# Patient Record
Sex: Female | Born: 1945
Health system: Southern US, Community
[De-identification: ages and names within clinical notes are randomized; demographics above are authoritative.]

## PROBLEM LIST (undated history)

## (undated) DIAGNOSIS — I10 Essential (primary) hypertension: Secondary | ICD-10-CM

## (undated) DIAGNOSIS — Z8709 Personal history of other diseases of the respiratory system: Secondary | ICD-10-CM

## (undated) DIAGNOSIS — R51 Headache: Secondary | ICD-10-CM

## (undated) DIAGNOSIS — N183 Chronic kidney disease, stage 3 unspecified: Secondary | ICD-10-CM

## (undated) DIAGNOSIS — R609 Edema, unspecified: Secondary | ICD-10-CM

## (undated) DIAGNOSIS — K219 Gastro-esophageal reflux disease without esophagitis: Secondary | ICD-10-CM

## (undated) DIAGNOSIS — H269 Unspecified cataract: Secondary | ICD-10-CM

## (undated) DIAGNOSIS — J189 Pneumonia, unspecified organism: Secondary | ICD-10-CM

## (undated) DIAGNOSIS — F32A Depression, unspecified: Secondary | ICD-10-CM

## (undated) DIAGNOSIS — M797 Fibromyalgia: Secondary | ICD-10-CM

## (undated) DIAGNOSIS — T4145XA Adverse effect of unspecified anesthetic, initial encounter: Secondary | ICD-10-CM

## (undated) DIAGNOSIS — F419 Anxiety disorder, unspecified: Secondary | ICD-10-CM

## (undated) DIAGNOSIS — D649 Anemia, unspecified: Secondary | ICD-10-CM

## (undated) DIAGNOSIS — G5600 Carpal tunnel syndrome, unspecified upper limb: Secondary | ICD-10-CM

## (undated) DIAGNOSIS — K589 Irritable bowel syndrome without diarrhea: Secondary | ICD-10-CM

## (undated) DIAGNOSIS — F329 Major depressive disorder, single episode, unspecified: Secondary | ICD-10-CM

## (undated) DIAGNOSIS — R0602 Shortness of breath: Secondary | ICD-10-CM

## (undated) DIAGNOSIS — I639 Cerebral infarction, unspecified: Secondary | ICD-10-CM

## (undated) DIAGNOSIS — F410 Panic disorder [episodic paroxysmal anxiety] without agoraphobia: Secondary | ICD-10-CM

## (undated) DIAGNOSIS — N289 Disorder of kidney and ureter, unspecified: Secondary | ICD-10-CM

## (undated) DIAGNOSIS — E119 Type 2 diabetes mellitus without complications: Secondary | ICD-10-CM

## (undated) DIAGNOSIS — M1990 Unspecified osteoarthritis, unspecified site: Secondary | ICD-10-CM

## (undated) DIAGNOSIS — I209 Angina pectoris, unspecified: Secondary | ICD-10-CM

## (undated) DIAGNOSIS — E7800 Pure hypercholesterolemia, unspecified: Secondary | ICD-10-CM

## (undated) DIAGNOSIS — G43909 Migraine, unspecified, not intractable, without status migrainosus: Secondary | ICD-10-CM

## (undated) HISTORY — PX: OTHER SURGICAL HISTORY: SHX169

## (undated) HISTORY — DX: Unspecified cataract: H26.9

## (undated) HISTORY — DX: Chronic kidney disease, stage 3 (moderate): N18.3

## (undated) HISTORY — DX: Chronic kidney disease, stage 3 unspecified: N18.30

## (undated) HISTORY — PX: CARPAL TUNNEL RELEASE: SHX101

## (undated) HISTORY — DX: Edema, unspecified: R60.9

## (undated) HISTORY — DX: Cerebral infarction, unspecified: I63.9

## (undated) HISTORY — DX: Depression, unspecified: F32.A

## (undated) HISTORY — DX: Major depressive disorder, single episode, unspecified: F32.9

## (undated) HISTORY — DX: Fibromyalgia: M79.7

---

## 1975-01-21 HISTORY — PX: VAGINAL HYSTERECTOMY: SUR661

## 1985-01-20 DIAGNOSIS — T8859XA Other complications of anesthesia, initial encounter: Secondary | ICD-10-CM

## 1985-01-20 HISTORY — DX: Other complications of anesthesia, initial encounter: T88.59XA

## 1985-01-20 HISTORY — PX: CHOLECYSTECTOMY: SHX55

## 1997-12-17 ENCOUNTER — Emergency Department (HOSPITAL_COMMUNITY): Admission: EM | Admit: 1997-12-17 | Discharge: 1997-12-17 | Payer: Self-pay | Admitting: Emergency Medicine

## 1998-08-06 ENCOUNTER — Other Ambulatory Visit: Admission: RE | Admit: 1998-08-06 | Discharge: 1998-08-06 | Payer: Self-pay | Admitting: Orthopedic Surgery

## 2000-01-21 DIAGNOSIS — I639 Cerebral infarction, unspecified: Secondary | ICD-10-CM

## 2000-01-21 HISTORY — PX: CARDIAC CATHETERIZATION: SHX172

## 2000-01-21 HISTORY — DX: Cerebral infarction, unspecified: I63.9

## 2000-03-03 ENCOUNTER — Inpatient Hospital Stay (HOSPITAL_COMMUNITY): Admission: EM | Admit: 2000-03-03 | Discharge: 2000-03-04 | Payer: Self-pay | Admitting: Emergency Medicine

## 2000-03-03 ENCOUNTER — Encounter: Payer: Self-pay | Admitting: Emergency Medicine

## 2000-04-21 ENCOUNTER — Other Ambulatory Visit: Admission: RE | Admit: 2000-04-21 | Discharge: 2000-04-21 | Payer: Self-pay | Admitting: Obstetrics and Gynecology

## 2000-10-12 ENCOUNTER — Encounter: Admission: RE | Admit: 2000-10-12 | Discharge: 2001-01-10 | Payer: Self-pay | Admitting: Family Medicine

## 2001-05-23 ENCOUNTER — Emergency Department (HOSPITAL_COMMUNITY): Admission: EM | Admit: 2001-05-23 | Discharge: 2001-05-23 | Payer: Self-pay | Admitting: Emergency Medicine

## 2001-06-15 ENCOUNTER — Other Ambulatory Visit: Admission: RE | Admit: 2001-06-15 | Discharge: 2001-06-15 | Payer: Self-pay | Admitting: Obstetrics and Gynecology

## 2001-08-21 ENCOUNTER — Encounter: Payer: Self-pay | Admitting: Emergency Medicine

## 2001-08-21 ENCOUNTER — Emergency Department (HOSPITAL_COMMUNITY): Admission: EM | Admit: 2001-08-21 | Discharge: 2001-08-21 | Payer: Self-pay | Admitting: Emergency Medicine

## 2001-09-14 ENCOUNTER — Ambulatory Visit (HOSPITAL_BASED_OUTPATIENT_CLINIC_OR_DEPARTMENT_OTHER): Admission: RE | Admit: 2001-09-14 | Discharge: 2001-09-14 | Payer: Self-pay | Admitting: Orthopaedic Surgery

## 2002-12-06 ENCOUNTER — Encounter: Payer: Self-pay | Admitting: Internal Medicine

## 2003-01-06 ENCOUNTER — Other Ambulatory Visit: Admission: RE | Admit: 2003-01-06 | Discharge: 2003-01-06 | Payer: Self-pay | Admitting: Obstetrics and Gynecology

## 2006-01-26 ENCOUNTER — Encounter: Admission: RE | Admit: 2006-01-26 | Discharge: 2006-01-26 | Payer: Self-pay | Admitting: Family Medicine

## 2008-01-21 HISTORY — PX: DEBRIDEMENT TENNIS ELBOW: SHX1442

## 2008-09-13 ENCOUNTER — Ambulatory Visit: Payer: Self-pay | Admitting: Internal Medicine

## 2008-09-13 DIAGNOSIS — R519 Headache, unspecified: Secondary | ICD-10-CM | POA: Insufficient documentation

## 2008-09-13 DIAGNOSIS — K589 Irritable bowel syndrome without diarrhea: Secondary | ICD-10-CM | POA: Insufficient documentation

## 2008-09-13 DIAGNOSIS — K8020 Calculus of gallbladder without cholecystitis without obstruction: Secondary | ICD-10-CM | POA: Insufficient documentation

## 2008-09-13 DIAGNOSIS — M129 Arthropathy, unspecified: Secondary | ICD-10-CM | POA: Insufficient documentation

## 2008-09-13 DIAGNOSIS — I6350 Cerebral infarction due to unspecified occlusion or stenosis of unspecified cerebral artery: Secondary | ICD-10-CM | POA: Insufficient documentation

## 2008-09-13 DIAGNOSIS — E669 Obesity, unspecified: Secondary | ICD-10-CM | POA: Insufficient documentation

## 2008-09-13 DIAGNOSIS — N390 Urinary tract infection, site not specified: Secondary | ICD-10-CM | POA: Insufficient documentation

## 2008-09-13 DIAGNOSIS — J45909 Unspecified asthma, uncomplicated: Secondary | ICD-10-CM | POA: Insufficient documentation

## 2008-09-13 DIAGNOSIS — I10 Essential (primary) hypertension: Secondary | ICD-10-CM | POA: Insufficient documentation

## 2008-09-13 DIAGNOSIS — IMO0001 Reserved for inherently not codable concepts without codable children: Secondary | ICD-10-CM | POA: Insufficient documentation

## 2008-09-13 DIAGNOSIS — E119 Type 2 diabetes mellitus without complications: Secondary | ICD-10-CM | POA: Insufficient documentation

## 2008-09-13 DIAGNOSIS — E785 Hyperlipidemia, unspecified: Secondary | ICD-10-CM | POA: Insufficient documentation

## 2008-09-13 DIAGNOSIS — R51 Headache: Secondary | ICD-10-CM | POA: Insufficient documentation

## 2009-11-14 ENCOUNTER — Ambulatory Visit (HOSPITAL_BASED_OUTPATIENT_CLINIC_OR_DEPARTMENT_OTHER): Admission: RE | Admit: 2009-11-14 | Discharge: 2009-11-14 | Payer: Self-pay | Admitting: Orthopedic Surgery

## 2010-04-03 LAB — GLUCOSE, CAPILLARY
Glucose-Capillary: 148 mg/dL — ABNORMAL HIGH (ref 70–99)
Glucose-Capillary: 172 mg/dL — ABNORMAL HIGH (ref 70–99)

## 2010-04-03 LAB — BASIC METABOLIC PANEL
BUN: 16 mg/dL (ref 6–23)
CO2: 27 mEq/L (ref 19–32)
Calcium: 9.7 mg/dL (ref 8.4–10.5)
Chloride: 106 mEq/L (ref 96–112)
Creatinine, Ser: 1.2 mg/dL (ref 0.4–1.2)
GFR calc Af Amer: 54 mL/min — ABNORMAL LOW (ref >60)
GFR calc non Af Amer: 45 mL/min — ABNORMAL LOW (ref >60)
Glucose, Bld: 312 mg/dL — ABNORMAL HIGH (ref 70–99)
Potassium: 4.6 mEq/L (ref 3.5–5.1)
Sodium: 138 mEq/L (ref 135–145)

## 2010-04-03 LAB — POCT HEMOGLOBIN-HEMACUE: Hemoglobin: 12.5 g/dL (ref 12.0–15.0)

## 2010-06-07 NOTE — Op Note (Signed)
NAMESHAWNAY, BRAMEL                       ACCOUNT NO.:  [id number]   MEDICAL RECORD NO.:  [id number]                   PATIENT TYPE:  AMB   LOCATION:  DSC                                  FACILITY:  [facility]   PHYSICIAN:  Lubertha Basque. Jerl Santos, M.D.             DATE OF BIRTH:  [birth year]   DATE OF PROCEDURE:  09/14/2001  DATE OF DISCHARGE:  09/14/2001                                 OPERATIVE REPORT   PREOPERATIVE DIAGNOSIS:  Left ankle impingement.   POSTOPERATIVE DIAGNOSIS:  Left ankle impingement.   PROCEDURE:  Left ankle arthroscopic synovectomy.   ANESTHESIA:  General.   SURGEON:  Lubertha Basque. Jerl Santos, M.D.   ASSISTANT:  Prince Rome, P.A.   INDICATIONS:  The patient is a 65 year old woman with more than a year of  left ankle pain.  This has persisted despite physical therapy and an  injection, which did afford her transient relief.  She is offered an  arthroscopy.  The procedure was discussed with the patient, and informed  operative consent was obtained after discussion of the possible  complications of, reaction to anesthesia, and infection.   DESCRIPTION OF PROCEDURE:  The patient was taken to the operating suite,  where general anesthesia was applied without difficulty.  She was positioned  supine and prepped and draped in the normal sterile fashion.  After the  administration of preop IV antibiotics, the left ankle was injected with 10  cc of Marcaine with epinephrine.  A small inferomedial incision was made  through skin alone with blunt dissection down into the capsule.  The scope  was then placed.  A second portal was made on the lateral aspect in a  similar fashion with blunt dissection down through the capsule after skin  incision was made superficially.  She did have a great deal of synovial  impingement in the lateral gutter, addressed with a synovectomy.  The dome  of the talus appeared benign, as did the undersurface of the tibia.  The  medial gutter  was completely benign.  The ankle was thoroughly irrigated,  followed by the placement of Marcaine with epinephrine and morphine.  Simple  sutures of nylon were used to loosely reapproximate the portals, followed by  Adaptic, dry gauze, and a loose Ace wrap.  Estimated blood loss and  intraoperative fluids can be obtained from the anesthesia records.   DISPOSITION:  The patient was extubated in the operating room and taken to  the recovery room in stable condition.  Plans were for her to go home the  same day and to follow up in the office in less than a week.  I will contact  her by phone tonight.  Lubertha Basque Jerl Santos, M.D.    PGD/MEDQ  D:  09/14/2001  T:  09/17/2001  Job:  [id number]

## 2010-06-07 NOTE — Cardiovascular Report (Signed)
[place]. [facility]  Patient:    Yolanda Saunders, Yolanda Saunders                    MRN: [id number] Proc. Date: 03/04/00 Adm. Date:  [id number] Attending:  Armanda Magic CC:         Arvella Merles, M.D.   Cardiac Catheterization  PROCEDURES PERFORMED:  Left heart catheterization.  INDICATIONS:  Chest pain.  The patient is a 65 year old white female with a past medical history of hyperlipidemia, diabetes, asthma, and a strong family history of coronary disease who presents with chest pain consistent with angina.  COMPLICATIONS:  None.  DESCRIPTION OF PROCEDURE:  The patient is brought to the cardiac catheterization laboratory in a fasting, nonsedated state.  Informed consent was obtained.  The patient was connected to continuous heart rate and pulse oximetry monitoring, and intermittent blood pressure monitoring.  The right groin was prepped and draped in a sterile fashion.  Lidocaine 1% was used for local anesthesia.  Using the modified Seldinger technique, a 6 French sheath was placed into the right femoral artery.  Under fluoroscopic guidance, a 6 French Judkins JL4 catheter was placed in the left coronary artery.  Multiple cine films were taken in the RAO and LAO position.  This catheter was then exchanged out for a 6 [place] JR4 catheter which was attempted to be placed in the right coronary artery but could not cannulate the artery.  This was then exchanged out over a wire for a 6 [place]  Noto right coronary artery catheter. Again, I was unable to cannulate the right coronary artery.  This was again exchanged out over a guidewire and the 6 [place] JR4 catheter was then placed back in the right coronary artery and was cannulated.  There was some damping of pressure because of the catheter cannulated far down in the artery. Cineangiography though was taken in the LAO position.  This catheter was then removed over a guidewire.  A 6 French angled pigtail catheter  was then placed into the left ventricular cavity over a guidewire under fluoroscopic guidance. Left ventriculography was performed in the RAO position.  This catheter was then exchanged out over a guidewire.  At the end of the procedure, all catheters and sheaths were removed.  Manual compression was performed until adequate hemostasis was obtained.  The patient was transferred back to the room in stable condition.  RESULTS: 1. Left main coronary artery.  The left main coronary artery was widely patent    throughout its course and bifurcated in the left anterior descending artery    and left circumflex artery. 2. The left anterior descending artery gave rise to two diagonal branches,    both of which are widely patent.  The left anterior descending artery is    widely patent throughout the rest of its course. 3. Left circumflex artery:  Left circumflex artery gave rise to some very    small obtuse marginal #1 and obtuse marginal branches and is widely patent    throughout its course before trifurcating the three distal vessels which    are widely patent. 4. Right coronary artery:  The right coronary artery is widely patent    throughout its course and bifurcates into a posterior descending and    posterolateral artery which are widely patent.  LEFT VENTRICULOGRAM:  The left ventriculography performed in the RAO position shows a very moderate area of apical hypokinesis with a very small focal area of apical akinesis.  IMPRESSION: 1. Normal coronary arteries. 2. Very small area of inferior apical akinesis.  Normal ejection fraction.  PLAN:  Discharge to home later today.  No Glucophage for 72 hours.  Followup with Dr. Mayford Knife in two weeks. DD:  03/04/00 TD:  03/04/00 Job: [id number]

## 2010-06-07 NOTE — H&P (Signed)
[place]. [facility]  Patient:    Yolanda Saunders, Yolanda Saunders                    MRN: [id number] Adm. Date:  [id number] Attending:  Lorre Nick CC:         [facility]   History and Physical  CHIEF COMPLAINT:  Chest pain.  HISTORY OF PRESENT ILLNESS:  This is a 65 year old white female with no previous cardiac history, who presents with substernal chest pain of three weeks duration.  The pain has increased in severity over the past two to three weeks, occurring on a daily basis, lasting for approximately 30 seconds to one minute at a time.  It is substernal in location, radiating to the right shoulder and neck, with what she described as "electrical shocks."  She rates the pain as a 7/10.  Her pain is sharp, as well as a pressure component.  She does have shortness of breath, but this is not any different from her baseline with her asthma.  She does get nausea, vomiting, diaphoresis, and dizziness with the chest pain episodes.  She occasionally has palpitations, but no syncope.  Currently she is pain-free.  Her sister just had bypass surgery and a carotid endarterectomy today.  PAST MEDICAL HISTORY: 1. Significant for diabetes mellitus. 2. Asthma. 3. Hyperlipidemia. 4. Arthritis.  ALLERGIES:  PENICILLIN, SULFA, CODEINE, ERYTHROMYCIN, AND DARVOCET.  PAST SURGICAL HISTORY: 1. Significant for a total abdominal hysterectomy in 1977. 2. Cholecystectomy in 1987. 3. Carpal tunnel released.  SOCIAL HISTORY:  She is married with two children.  She used to smoke one pack per day of tobacco for 10 years, but quit 12 years ago.  She denies any alcohol use.  She drinks greater than four caffeinated drinks a day.  FAMILY HISTORY:  Her mother died at age 26 from a CVA and diabetes mellitus. Her father died at age 50 of diabetes mellitus, coronary artery disease, and he is status post coronary artery bypass grafting.  She has a sister age  16, with hypertension, diabetes mellitus, and coronary artery disease, status post coronary artery bypass grafting surgery today.  She has one brother age 45, with hypertension, one brother age 24 with hypertension, coronary artery disease, and diabetes mellitus.  CURRENT MEDICATIONS: 1. Actos 45 mg q.d. 2. Glucophage 850 mg t.i.d. 3. Humulin R on a scale of 4-8, two to three times a day. 4. Humulin N on a scale of 8-12, two to three times a day. 5. Pravachol 40 mg q.d. 6. Ortho-Est 0.65 mg q.d. 7. Hydrochlorothiazide p.r.n. 8. Vioxx 25 mg q.d.  PHYSICAL EXAMINATION:  VITAL SIGNS:  Blood pressure 132/63, heart rate 69.  GENERAL:  This is a well-developed obese white female, in no acute distress.  NECK:  Supple, no lymphadenopathy, no bruits.  LUNGS:  Clear to auscultation throughout.  HEART:  A regular rate and rhythm.  No murmurs, rubs, or gallops.  Normal S1, S2.  ABDOMEN:  Benign.  EXTREMITIES:  No cyanosis, edema, or erythema.  Good distal pulses.  LABORATORY DATA:  Pending.  Electrocardiogram shows normal sinus rhythm at 66, with no ST-T wave abnormalities.  ASSESSMENT/PLAN: 1. Acute chest pain syndrome, somewhat atypical for angina, but    crescendo in nature, with multiple cardiac risk factors.  These    include a significant family history of diabetes mellitus,    hyperlipidemia, and her age.  PLAN:  Admit to telemetry, rule out a myocardial  infarction with cardiac enzymes.  Plan for a heart catheterization in the morning.  IV Heparin, nitroglycerin drips, with aspirin and Plavix 75 mg q.d.  Hold beta blocker, secondary to underlying asthma.  Check a fasting lipid panel.  2. Diabetes mellitus.  PLAN:  Continue Actos.  Hold Glucophage for the next 72 hours.  Cover with sliding scale insulin.  The patient can dose her own insulin.  3. Hyperlipidemia.  PLAN:  Will check a fasting lipid panel. DD:  03/03/00 TD:  03/03/00 Job: [id number]

## 2010-10-23 ENCOUNTER — Other Ambulatory Visit: Payer: Self-pay | Admitting: Internal Medicine

## 2010-10-23 DIAGNOSIS — N183 Chronic kidney disease, stage 3 unspecified: Secondary | ICD-10-CM

## 2010-10-31 ENCOUNTER — Ambulatory Visit
Admission: RE | Admit: 2010-10-31 | Discharge: 2010-10-31 | Disposition: A | Discharge status: Home / Self Care | Payer: Medicare Other | Source: Ambulatory Visit | Attending: Internal Medicine | Admitting: Internal Medicine

## 2010-10-31 DIAGNOSIS — N183 Chronic kidney disease, stage 3 unspecified: Secondary | ICD-10-CM

## 2011-04-16 ENCOUNTER — Other Ambulatory Visit: Payer: Self-pay

## 2011-04-16 ENCOUNTER — Observation Stay (HOSPITAL_COMMUNITY)
Admission: EM | Admit: 2011-04-16 | Discharge: 2011-04-18 | Disposition: A | Discharge status: Home / Self Care | Payer: Medicare Other | Source: Ambulatory Visit | Attending: Internal Medicine | Admitting: Internal Medicine

## 2011-04-16 ENCOUNTER — Encounter (HOSPITAL_COMMUNITY): Payer: Self-pay | Admitting: *Deleted

## 2011-04-16 ENCOUNTER — Emergency Department (HOSPITAL_COMMUNITY): Payer: Medicare Other

## 2011-04-16 DIAGNOSIS — R110 Nausea: Secondary | ICD-10-CM | POA: Insufficient documentation

## 2011-04-16 DIAGNOSIS — E785 Hyperlipidemia, unspecified: Secondary | ICD-10-CM | POA: Diagnosis present

## 2011-04-16 DIAGNOSIS — E119 Type 2 diabetes mellitus without complications: Secondary | ICD-10-CM | POA: Diagnosis present

## 2011-04-16 DIAGNOSIS — R079 Chest pain, unspecified: Principal | ICD-10-CM | POA: Diagnosis present

## 2011-04-16 DIAGNOSIS — I209 Angina pectoris, unspecified: Secondary | ICD-10-CM

## 2011-04-16 DIAGNOSIS — R0602 Shortness of breath: Secondary | ICD-10-CM | POA: Insufficient documentation

## 2011-04-16 DIAGNOSIS — J45909 Unspecified asthma, uncomplicated: Secondary | ICD-10-CM | POA: Insufficient documentation

## 2011-04-16 DIAGNOSIS — I10 Essential (primary) hypertension: Secondary | ICD-10-CM | POA: Diagnosis present

## 2011-04-16 DIAGNOSIS — K219 Gastro-esophageal reflux disease without esophagitis: Secondary | ICD-10-CM | POA: Insufficient documentation

## 2011-04-16 HISTORY — DX: Gastro-esophageal reflux disease without esophagitis: K21.9

## 2011-04-16 HISTORY — DX: Personal history of other diseases of the respiratory system: Z87.09

## 2011-04-16 HISTORY — DX: Cerebral infarction, unspecified: I63.9

## 2011-04-16 HISTORY — DX: Panic disorder (episodic paroxysmal anxiety): F41.0

## 2011-04-16 HISTORY — DX: Carpal tunnel syndrome, unspecified upper limb: G56.00

## 2011-04-16 HISTORY — DX: Shortness of breath: R06.02

## 2011-04-16 HISTORY — DX: Angina pectoris, unspecified: I20.9

## 2011-04-16 HISTORY — DX: Disorder of kidney and ureter, unspecified: N28.9

## 2011-04-16 HISTORY — DX: Adverse effect of unspecified anesthetic, initial encounter: T41.45XA

## 2011-04-16 HISTORY — DX: Headache: R51

## 2011-04-16 HISTORY — DX: Pure hypercholesterolemia, unspecified: E78.00

## 2011-04-16 HISTORY — DX: Migraine, unspecified, not intractable, without status migrainosus: G43.909

## 2011-04-16 HISTORY — DX: Type 2 diabetes mellitus without complications: E11.9

## 2011-04-16 HISTORY — DX: Essential (primary) hypertension: I10

## 2011-04-16 HISTORY — DX: Pneumonia, unspecified organism: J18.9

## 2011-04-16 HISTORY — DX: Anxiety disorder, unspecified: F41.9

## 2011-04-16 HISTORY — DX: Anemia, unspecified: D64.9

## 2011-04-16 LAB — DIFFERENTIAL
Basophils Absolute: 0 10*3/uL (ref 0.0–0.1)
Basophils Relative: 0 % (ref 0–1)
Eosinophils Absolute: 0.1 10*3/uL (ref 0.0–0.7)
Lymphs Abs: 1.5 10*3/uL (ref 0.7–4.0)
Neutrophils Relative %: 76 % (ref 43–77)

## 2011-04-16 LAB — BASIC METABOLIC PANEL
GFR calc Af Amer: 55 mL/min — ABNORMAL LOW (ref >90)
GFR calc non Af Amer: 48 mL/min — ABNORMAL LOW (ref >90)
Potassium: 4 mEq/L (ref 3.5–5.1)
Sodium: 140 mEq/L (ref 135–145)

## 2011-04-16 LAB — CBC
MCH: 31 pg (ref 26.0–34.0)
MCHC: 34 g/dL (ref 30.0–36.0)
Platelets: 170 10*3/uL (ref 150–400)
RBC: 4.61 MIL/uL (ref 3.87–5.11)

## 2011-04-16 LAB — TROPONIN I: Troponin I: <0.3 ng/mL (ref <0.30)

## 2011-04-16 MED ORDER — ASPIRIN EC 325 MG PO TBEC
325.0000 mg | DELAYED_RELEASE_TABLET | Freq: Every day | ORAL | Status: DC
  Administered 2011-04-17 – 2011-04-18 (×2): 325 mg via ORAL
  Filled 2011-04-16 (×2): qty 1

## 2011-04-16 MED ORDER — ONDANSETRON HCL 4 MG/2ML IJ SOLN: 4.0000 mg | Freq: Four times a day (QID) | INTRAMUSCULAR | Status: DC | PRN

## 2011-04-16 MED ORDER — ENOXAPARIN SODIUM 40 MG/0.4ML ~~LOC~~ SOLN
40.0000 mg | SUBCUTANEOUS | Status: DC
  Administered 2011-04-17 – 2011-04-18 (×2): 40 mg via SUBCUTANEOUS
  Filled 2011-04-16 (×2): qty 0.4

## 2011-04-16 MED ORDER — INSULIN ASPART 100 UNIT/ML ~~LOC~~ SOLN
0.0000 [IU] | Freq: Three times a day (TID) | SUBCUTANEOUS | Status: DC
  Administered 2011-04-17: 2 [IU] via SUBCUTANEOUS
  Administered 2011-04-17: 5 [IU] via SUBCUTANEOUS
  Administered 2011-04-17: 2 [IU] via SUBCUTANEOUS
  Administered 2011-04-18: 3 [IU] via SUBCUTANEOUS

## 2011-04-16 MED ORDER — ATORVASTATIN CALCIUM 40 MG PO TABS
40.0000 mg | ORAL_TABLET | Freq: Every day | ORAL | Status: DC
  Administered 2011-04-17: 40 mg via ORAL
  Filled 2011-04-16 (×2): qty 1

## 2011-04-16 MED ORDER — INSULIN DETEMIR 100 UNIT/ML ~~LOC~~ SOLN
44.0000 [IU] | Freq: Every day | SUBCUTANEOUS | Status: DC
  Administered 2011-04-17: 44 [IU] via SUBCUTANEOUS
  Filled 2011-04-16: qty 10

## 2011-04-16 MED ORDER — FAMOTIDINE 20 MG PO TABS
20.0000 mg | ORAL_TABLET | Freq: Every day | ORAL | Status: DC
  Administered 2011-04-17 – 2011-04-18 (×2): 20 mg via ORAL
  Filled 2011-04-16 (×2): qty 1

## 2011-04-16 MED ORDER — ALBUTEROL SULFATE HFA 108 (90 BASE) MCG/ACT IN AERS: 2.0000 | INHALATION_SPRAY | Freq: Four times a day (QID) | RESPIRATORY_TRACT | Status: DC | PRN

## 2011-04-16 MED ORDER — ONDANSETRON HCL 4 MG PO TABS: 4.0000 mg | ORAL_TABLET | Freq: Four times a day (QID) | ORAL | Status: DC | PRN

## 2011-04-16 MED ORDER — ASPIRIN 81 MG PO CHEW
324.0000 mg | CHEWABLE_TABLET | Freq: Once | ORAL | Status: AC
  Administered 2011-04-16: 324 mg via ORAL
  Filled 2011-04-16: qty 4

## 2011-04-16 MED ORDER — NITROGLYCERIN 0.4 MG SL SUBL: 0.4000 mg | SUBLINGUAL_TABLET | SUBLINGUAL | Status: DC | PRN

## 2011-04-16 MED ORDER — SODIUM CHLORIDE 0.9 % IV SOLN
INTRAVENOUS | Status: DC
  Administered 2011-04-17: 01:00:00 via INTRAVENOUS

## 2011-04-16 MED ORDER — ACETAMINOPHEN 325 MG PO TABS: 650.0000 mg | ORAL_TABLET | Freq: Four times a day (QID) | ORAL | Status: DC | PRN

## 2011-04-16 MED ORDER — ACETAMINOPHEN 650 MG RE SUPP: 650.0000 mg | Freq: Four times a day (QID) | RECTAL | Status: DC | PRN

## 2011-04-16 MED ORDER — SODIUM CHLORIDE 0.9 % IJ SOLN
3.0000 mL | Freq: Two times a day (BID) | INTRAMUSCULAR | Status: DC
  Administered 2011-04-17 (×3): 3 mL via INTRAVENOUS

## 2011-04-16 NOTE — ED Provider Notes (Signed)
I saw and evaluated the patient, reviewed the resident's note and I agree with the findings and plan.   Rolan Bucco, MD 04/16/11 2328

## 2011-04-16 NOTE — ED Notes (Signed)
3743-01 Ready 

## 2011-04-16 NOTE — ED Notes (Signed)
Pt has been exhausted and fatigued with intermittent chest pain for last couple of days.  Pt has history of reflux.  Today was riding in car and got sharp chest pain and became nauseated and stated she was nauseated.  Pt sts lasted 15 minutes

## 2011-04-16 NOTE — H&P (Signed)
Yolanda Saunders is an 66 y.o. female.   PCP - Dr.Harris.Deboraha Sprang). Chief Complaint: Chest pain. HPI: 66 year-old female with known history of diabetes mellitus2, asthma, hyperlipidemia and previous history of cardiac catheter in 2002 and had CVA at the time, chronic renal disease presented to the ER because of chest pain. Patient has been experiencing chest pain over the last 3 days. Last 2 days the pain only last for a few seconds and was retrosternal. And today while patient was in the car with her husband around 4 PM patient started having chest pain which was more than what she had last 2 days. Patient felt like pressure retrosternal nonradiating like a bubble behind the sternum. The episode lasted for 15 minutes and got resolved by itself. After which they drove to the ER. Presently patient's cardiac enzymes EKG and chest x-rays are not showing any acute the EKG does show poor R-wave progression. In addition patient has been noticing intense fatigue for the last 2 days which increases with exertion. Denies any shortness of breath nausea diaphoresis palpitations dizziness abdominal pain focal deficits fever chills.   Past Medical History  Diagnosis Date  . Diabetes mellitus   . Renal disorder   . GERD (gastroesophageal reflux disease)   . Asthma   . Carpal tunnel syndrome     Past Surgical History  Procedure Date  . Cholecystectomy   . Abdominal hysterectomy   . Cardiac catheterization     Family History  Problem Relation Age of Onset  . Coronary artery disease Father    Social History:  reports that she has quit smoking. She does not have any smokeless tobacco history on file. She reports that she does not drink alcohol or use illicit drugs.  Allergies:  Allergies  Allergen Reactions  . Codeine Other (See Comments)    unknown  . Erythromycin Other (See Comments)    unknown  . Lisinopril Other (See Comments)    unknown  . Penicillins Other (See Comments)    unknown  .  Sulfonamide Derivatives Other (See Comments)    unknown    Medications Prior to Admission  Medication Dose Route Frequency Provider Last Rate Last Dose  . aspirin chewable tablet 324 mg  324 mg Oral Once Pricilla Loveless, MD   324 mg at 04/16/11 1933   No current outpatient prescriptions on file as of 04/16/2011.    Results for orders placed during the hospital encounter of 04/16/11 (from the past 48 hour(s))  CBC     Status: Normal   Collection Time   04/16/11  7:17 PM      Component Value Range Comment   WBC 8.6  4.0 - 10.5 (K/uL)    RBC 4.61  3.87 - 5.11 (MIL/uL)    Hemoglobin 14.3  12.0 - 15.0 (g/dL)    HCT 16.1  09.6 - 04.5 (%)    MCV 91.1  78.0 - 100.0 (fL)    MCH 31.0  26.0 - 34.0 (pg)    MCHC 34.0  30.0 - 36.0 (g/dL)    RDW 40.9  81.1 - 91.4 (%)    Platelets 170  150 - 400 (K/uL)   DIFFERENTIAL     Status: Normal   Collection Time   04/16/11  7:17 PM      Component Value Range Comment   Neutrophils Relative 76  43 - 77 (%)    Neutro Abs 6.5  1.7 - 7.7 (K/uL)    Lymphocytes Relative 17  12 -  46 (%)    Lymphs Abs 1.5  0.7 - 4.0 (K/uL)    Monocytes Relative 6  3 - 12 (%)    Monocytes Absolute 0.5  0.1 - 1.0 (K/uL)    Eosinophils Relative 1  0 - 5 (%)    Eosinophils Absolute 0.1  0.0 - 0.7 (K/uL)    Basophils Relative 0  0 - 1 (%)    Basophils Absolute 0.0  0.0 - 0.1 (K/uL)   BASIC METABOLIC PANEL     Status: Abnormal   Collection Time   04/16/11  7:17 PM      Component Value Range Comment   Sodium 140  135 - 145 (mEq/L)    Potassium 4.0  3.5 - 5.1 (mEq/L)    Chloride 101  96 - 112 (mEq/L)    CO2 31  19 - 32 (mEq/L)    Glucose, Bld 205 (*) 70 - 99 (mg/dL)    BUN 18  6 - 23 (mg/dL)    Creatinine, Ser 9.60 (*) 0.50 - 1.10 (mg/dL)    Calcium 45.4  8.4 - 10.5 (mg/dL)    GFR calc non Af Amer 48 (*) >90 (mL/min)    GFR calc Af Amer 55 (*) >90 (mL/min)   TROPONIN I     Status: Normal   Collection Time   04/16/11  7:17 PM      Component Value Range Comment   Troponin I  <0.30  <0.30 (ng/mL)    Dg Chest 2 View  04/16/2011  *RADIOLOGY REPORT*  Clinical Data: Chest pain.  CHEST - 2 VIEW  Comparison: None  Findings: The cardiac silhouette, mediastinal and hilar contours are within normal limits.  The lungs are clear.  No pleural effusion.  The bony thorax is intact.  IMPRESSION: Normal chest x-ray.  Original Report Authenticated By: P. Loralie Champagne, M.D.    Review of Systems  Constitutional: Positive for malaise/fatigue.  HENT: Negative.   Eyes: Negative.   Respiratory: Negative.   Cardiovascular: Positive for chest pain.  Gastrointestinal: Negative.   Genitourinary: Negative.   Musculoskeletal: Negative.   Skin: Negative.   Neurological: Negative.   Endo/Heme/Allergies: Negative.   Psychiatric/Behavioral: Negative.     Blood pressure 125/55, pulse 62, temperature 98.1 F (36.7 C), temperature source Oral, resp. rate 12, SpO2 100.00%. Physical Exam  Constitutional: She is oriented to person, place, and time. She appears well-developed and well-nourished. No distress.  HENT:  Head: Normocephalic and atraumatic.  Eyes: Conjunctivae are normal. Pupils are equal, round, and reactive to light. Right eye exhibits no discharge. Left eye exhibits no discharge.  Neck: Normal range of motion. Neck supple.  Cardiovascular: Normal rate and regular rhythm.   Respiratory: Effort normal and breath sounds normal. No respiratory distress. She has no wheezes. She has no rales.  GI: Soft. Bowel sounds are normal. She exhibits no distension. There is no tenderness. There is no rebound.  Musculoskeletal: Normal range of motion. She exhibits no edema and no tenderness.  Neurological: She is alert and oriented to person, place, and time.       Moves all extremities.  Skin: Skin is warm and dry. No rash noted. She is not diaphoretic. No erythema.  Psychiatric: Her behavior is normal.     Assessment/Plan #1. Chest pain  - patient is chest pain-free at this time .we'll  cycle cardiac markers. Since patient also complains of exertional symptoms we'll check d-dimer , BNP and 2-D echo. Patient will need cardiology consult in a.m. due  to multiple risk factors . #2. History of diabetes mellitus 2, hypertension , hyperlipidemia , chronic kidney disease  - continue present medications .  CODE STATUS  - full code .  Armend Hochstatter N. 04/16/2011, 11:03 PM

## 2011-04-16 NOTE — ED Provider Notes (Signed)
History     CSN: [id number]  Arrival date & time 04/16/11  1757   First MD Initiated Contact with Patient 04/16/11 1857      Chief Complaint  Patient presents with  . Chest Pain    (Consider location/radiation/quality/duration/timing/severity/associated sxs/prior treatment) Patient is a 66 y.o. female presenting with chest pain. The history is provided by the patient.  Chest Pain The chest pain began 1 - 2 hours ago. Duration of episode(s) is 15 minutes. Chest pain occurs intermittently. The chest pain is resolved. The pain is associated with exertion. At its most intense, the pain is at 7/10. The pain is currently at 0/10. Quality: "pain" The pain does not radiate. Chest pain is worsened by exertion. Primary symptoms include shortness of breath and nausea. Pertinent negatives for primary symptoms include no fever, no palpitations, no abdominal pain and no vomiting.  Pertinent negatives for associated symptoms include no lower extremity edema and no weakness.     Past Medical History  Diagnosis Date  . Diabetes mellitus   . Renal disorder   . GERD (gastroesophageal reflux disease)   . Asthma   . Carpal tunnel syndrome     Past Surgical History  Procedure Date  . Cholecystectomy   . Abdominal hysterectomy     No family history on file.  History  Substance Use Topics  . Smoking status: Former Games developer  . Smokeless tobacco: Not on file  . Alcohol Use: No    OB History    Grav Para Term Preterm Abortions TAB SAB Ect Mult Living                  Review of Systems  Constitutional: Negative for fever and chills.  HENT: Negative for congestion and rhinorrhea.   Respiratory: Positive for shortness of breath.   Cardiovascular: Positive for chest pain. Negative for palpitations and leg swelling.  Gastrointestinal: Positive for nausea. Negative for vomiting, abdominal pain and constipation.  Genitourinary: Negative for urgency, decreased urine volume and difficulty  urinating.  Skin: Negative for wound.  Neurological: Negative for weakness, light-headedness and headaches.  Psychiatric/Behavioral: Negative for confusion.  All other systems reviewed and are negative.    Allergies  Codeine; Erythromycin; Lisinopril; Penicillins; and Sulfonamide derivatives  Home Medications   Current Outpatient Rx  Name Route Sig Dispense Refill  . ALBUTEROL SULFATE HFA 108 (90 BASE) MCG/ACT IN AERS Inhalation Inhale 2 puffs into the lungs every 6 (six) hours as needed. For shortness of breath    . ASPIRIN EC 81 MG PO TBEC Oral Take 81 mg by mouth daily.    Marland Kitchen ESTROPIPATE 0.75 MG PO TABS Oral Take 0.75 mg by mouth daily.    . INSULIN DETEMIR 100 UNIT/ML [place] SOLN Subcutaneous Inject 44 Units into the skin daily.    Marland Kitchen VICTOZA [place] Subcutaneous Inject 1.2 mg into the skin daily.    Marland Kitchen RANITIDINE HCL 150 MG PO TABS Oral Take 300 mg by mouth 2 (two) times daily.    Marland Kitchen ROSUVASTATIN CALCIUM 20 MG PO TABS Oral Take 20 mg by mouth daily.      BP 112/78  Pulse 78  Temp(Src) 98.3 F (36.8 C) (Oral)  Resp 14  SpO2 98%  Physical Exam  Nursing note and vitals reviewed. Constitutional: She is oriented to person, place, and time. She appears well-developed and well-nourished. No distress.  HENT:  Head: Normocephalic and atraumatic.  Right Ear: External ear normal.  Left Ear: External ear normal.  Nose: Nose normal.  Mouth/Throat: Oropharynx is clear and moist.  Neck: Neck supple.  Cardiovascular: Normal rate, regular rhythm, normal heart sounds and intact distal pulses.   No murmur heard. Pulmonary/Chest: Effort normal and breath sounds normal. No respiratory distress. She has no wheezes. She has no rales.  Abdominal: Soft. She exhibits no distension. There is no tenderness.  Musculoskeletal: She exhibits no edema.  Lymphadenopathy:    She has no cervical adenopathy.  Neurological: She is alert and oriented to person, place, and time.  Skin: Skin is warm and dry. She is  not diaphoretic. No pallor.    ED Course  Procedures (including critical care time)  Labs Reviewed  BASIC METABOLIC PANEL - Abnormal; Notable for the following:    Glucose, Bld 205 (*)    Creatinine, Ser 1.16 (*)    GFR calc non Af Amer 48 (*)    GFR calc Af Amer 55 (*)    All other components within normal limits  CBC  DIFFERENTIAL  TROPONIN I   Dg Chest 2 View  04/16/2011  *RADIOLOGY REPORT*  Clinical Data: Chest pain.  CHEST - 2 VIEW  Comparison: None  Findings: The cardiac silhouette, mediastinal and hilar contours are within normal limits.  The lungs are clear.  No pleural effusion.  The bony thorax is intact.  IMPRESSION: Normal chest x-ray.  Original Report Authenticated By: P. Loralie Champagne, M.D.     Date: 04/16/2011  Rate: 71  Rhythm: normal sinus rhythm  QRS Axis: indeterminate  Intervals: normal  ST/T Wave abnormalities: nonspecific ST/T changes  Conduction Disutrbances:none  Narrative Interpretation:   Old EKG Reviewed: unchanged   1. Chest pain       MDM  66 yo female with chest pain while in car today with dyspnea and nausea. Has had exertional chest pain as well over past couple days, which improves at rest. Chest pain today concerning for worsening or unstable angina. EKG unchanged. Doubt PE or dissection based on symptoms and patient being currently pain free. Trop negative, not c/w NSTEMI. Admitted to [facility] for rule out and workup for cardiac cause.        Pricilla Loveless, MD 04/16/11 [phone]

## 2011-04-16 NOTE — ED Notes (Signed)
Pt states she has no pain when at rest, but has pain and nausea with exertion.  Pt currently denies pain, nausea.

## 2011-04-17 ENCOUNTER — Encounter (HOSPITAL_COMMUNITY): Payer: Self-pay | Admitting: General Practice

## 2011-04-17 DIAGNOSIS — J189 Pneumonia, unspecified organism: Secondary | ICD-10-CM

## 2011-04-17 DIAGNOSIS — F410 Panic disorder [episodic paroxysmal anxiety] without agoraphobia: Secondary | ICD-10-CM

## 2011-04-17 DIAGNOSIS — N289 Disorder of kidney and ureter, unspecified: Secondary | ICD-10-CM

## 2011-04-17 DIAGNOSIS — D649 Anemia, unspecified: Secondary | ICD-10-CM

## 2011-04-17 HISTORY — DX: Panic disorder (episodic paroxysmal anxiety): F41.0

## 2011-04-17 HISTORY — DX: Anemia, unspecified: D64.9

## 2011-04-17 HISTORY — DX: Disorder of kidney and ureter, unspecified: N28.9

## 2011-04-17 HISTORY — DX: Pneumonia, unspecified organism: J18.9

## 2011-04-17 LAB — GLUCOSE, CAPILLARY
Glucose-Capillary: 259 mg/dL — ABNORMAL HIGH (ref 70–99)
Glucose-Capillary: 197 mg/dL — ABNORMAL HIGH (ref 70–99)

## 2011-04-17 LAB — LIPID PANEL
Cholesterol: 106 mg/dL (ref 0–200)
LDL Cholesterol: 37 mg/dL (ref 0–99)
Total CHOL/HDL Ratio: 2.3 RATIO
VLDL: 23 mg/dL (ref 0–40)

## 2011-04-17 LAB — D-DIMER, QUANTITATIVE: D-Dimer, Quant: 0.31 ug/mL-FEU (ref 0.00–0.48)

## 2011-04-17 LAB — CBC
HCT: 39.1 % (ref 36.0–46.0)
HCT: 44 % (ref 36.0–46.0)
Hemoglobin: 13.3 g/dL (ref 12.0–15.0)
MCH: 31.5 pg (ref 26.0–34.0)
MCHC: 34 g/dL (ref 30.0–36.0)
MCV: 91.3 fL (ref 78.0–100.0)
Platelets: 189 10*3/uL (ref 150–400)
RBC: 4.24 MIL/uL (ref 3.87–5.11)
RBC: 4.82 MIL/uL (ref 3.87–5.11)
WBC: 7.3 10*3/uL (ref 4.0–10.5)

## 2011-04-17 LAB — COMPREHENSIVE METABOLIC PANEL
ALT: 22 U/L (ref 0–35)
AST: 19 U/L (ref 0–37)
Calcium: 9.6 mg/dL (ref 8.4–10.5)
Creatinine, Ser: 1.11 mg/dL — ABNORMAL HIGH (ref 0.50–1.10)
GFR calc Af Amer: 58 mL/min — ABNORMAL LOW (ref >90)
GFR calc non Af Amer: 50 mL/min — ABNORMAL LOW (ref >90)
Glucose, Bld: 225 mg/dL — ABNORMAL HIGH (ref 70–99)
Sodium: 143 mEq/L (ref 135–145)
Total Protein: 5.9 g/dL — ABNORMAL LOW (ref 6.0–8.3)

## 2011-04-17 LAB — CARDIAC PANEL(CRET KIN+CKTOT+MB+TROPI)
CK, MB: 1.8 ng/mL (ref 0.3–4.0)
CK, MB: 2 ng/mL (ref 0.3–4.0)
Relative Index: INVALID (ref 0.0–2.5)
Total CK: 57 U/L (ref 7–177)
Troponin I: <0.3 ng/mL (ref <0.30)

## 2011-04-17 LAB — HEMOGLOBIN A1C
Hgb A1c MFr Bld: 7.7 % — ABNORMAL HIGH (ref <5.7)
Mean Plasma Glucose: 174 mg/dL — ABNORMAL HIGH (ref <117)

## 2011-04-17 LAB — PRO B NATRIURETIC PEPTIDE: Pro B Natriuretic peptide (BNP): 34.5 pg/mL (ref 0–125)

## 2011-04-17 LAB — CREATININE, SERUM: GFR calc Af Amer: 60 mL/min — ABNORMAL LOW (ref >90)

## 2011-04-17 MED ORDER — POTASSIUM CHLORIDE CRYS ER 20 MEQ PO TBCR
40.0000 meq | EXTENDED_RELEASE_TABLET | Freq: Once | ORAL | Status: AC
  Administered 2011-04-17: 40 meq via ORAL
  Filled 2011-04-17: qty 2

## 2011-04-17 NOTE — Progress Notes (Signed)
Subjective: Patient denies chest pain this morning. Relates chest pain for last 3 days on and off, chest pain yesterday was very severe. Pressure like, accompany with nausea. Denies GERD like symptoms. She also relates SOB, fatigue on exertion. Her HB-A1c 6 months ago was at 13. Her most recent HB-A1c is at 7.2.  Objective: Filed Vitals:   04/16/11 2245 04/16/11 2300 04/17/11 0000 04/17/11 0500  BP: 136/67 124/59 136/79 111/6  Pulse: 62 63 69 65  Temp:   98 F (36.7 C) 98 F (36.7 C)  TempSrc:   Oral Oral  Resp: 15 17 18 18   Height:   5' 3.5" (1.613 m)   Weight:   74.662 kg (164 lb 9.6 oz) 74.753 kg (164 lb 12.8 oz)  SpO2: 100% 100% 100% 99%   Weight change:  No intake or output data in the 24 hours ending 04/17/11 0808  General: Alert, awake, oriented x3, in no acute distress.  HEENT: No bruits, no goiter.  Heart: Regular rate and rhythm, without murmurs, rubs, gallops.  Lungs: CTA, bilateral air movement.  Abdomen: Soft, nontender, nondistended, positive bowel sounds.  Neuro: Grossly intact, nonfocal. Extremities; no edema.   Lab Results:  [facility] 04/17/11 0605 04/16/11 2338 04/16/11 1917  NA 143 -- 140  K 3.4* -- 4.0  CL 106 -- 101  CO2 28 -- 31  GLUCOSE 225* -- 205*  BUN 18 -- 18  CREATININE 1.11* 1.08 --  CALCIUM 9.6 -- 10.1  MG -- -- --  PHOS -- -- --    Basename 04/17/11 0605  AST 19  ALT 22  ALKPHOS 57  BILITOT 0.4  PROT 5.9*  ALBUMIN 3.3*    Basename 04/17/11 0605 04/16/11 2338 04/16/11 1917  WBC 6.0 7.3 --  NEUTROABS -- -- 6.5  HGB 13.3 15.2* --  HCT 39.1 44.0 --  MCV 92.2 91.3 --  PLT 156 189 --    Basename 04/17/11 0605 04/16/11 2337 04/16/11 1917  CKTOTAL 57 68 --  CKMB 1.8 2.0 --  CKMBINDEX -- -- --  TROPONINI <0.30 <0.30 <0.30    Basename 04/16/11 2338  DDIMER 0.31     Studies/Results: Dg Chest 2 View  04/16/2011  *RADIOLOGY REPORT*  Clinical Data: Chest pain.  CHEST - 2 VIEW  Comparison: None  Findings: The cardiac  silhouette, mediastinal and hilar contours are within normal limits.  The lungs are clear.  No pleural effusion.  The bony thorax is intact.  IMPRESSION: Normal chest x-ray.  Original Report Authenticated By: P. Loralie Champagne, M.D.    Medications: I have reviewed the patient's current medications.   Patient Active Hospital Problem List:  Chest pain (04/16/2011): Patient with multiple risk factor, her HB-A1c 6 month ago was at 20. Cardiac enzymes times 3 negative. ECHO pending.  D dimer normal, unlikely PE. Chest X ray normal. Cardiology consulted. Check fasting lipid panel, TSH.   DIABETES MELLITUS (09/13/2008) Continue with Levemir and SSI.   HYPERLIPIDEMIA (09/13/2008) I will check fasting lipid panel.  Continue with Lipitor.  HYPERTENSION (09/13/2008)  BP controlled. Not on BP medications. Take sometimes lasix for lower extremities edema.   Hypokalemia: Replaced with 40 meq po times one.    LOS: 1 day   Fergie Sherbert M.D.  [facility] 04/17/2011, 8:08 AM

## 2011-04-17 NOTE — Progress Notes (Signed)
   CARE MANAGEMENT NOTE 04/17/2011  Patient:  Yolanda Saunders, Yolanda Saunders   Account Number:  [id number]  Date Initiated:  04/17/2011  Documentation initiated by:  Onnie Boer  Subjective/Objective Assessment:   PT WAS ADMITTED WITH CP     Action/Plan:   PROGRESSION OF CARE AND DISCHARGE PLANNING   Anticipated DC Date:  04/19/2011   Anticipated DC Plan:           Choice offered to / List presented to:             Status of service:  In process, will continue to follow Medicare Important Message given?   (If response is "NO", the following Medicare IM given date fields will be blank) Date Medicare IM given:   Date Additional Medicare IM given:    Discharge Disposition:    Per UR Regulation:  Reviewed for med. necessity/level of care/duration of stay  If discussed at Long Length of Stay Meetings, dates discussed:    Comments:  04/17/11 Onnie Boer, RN, BSN 1443  UR COMPLETED

## 2011-04-17 NOTE — Consult Note (Signed)
Admit date: 04/16/2011 Referring Physician  [facility] Primary Physician  Johny Blamer, M.D. Primary Cardiologist  T. Turner, M.D. Reason for Consultation  chest pain  ASSESSMENT: 1. 20 minute episode of substernal/epigastric chest pressure similar to 2002 at which time coronary angiography was unremarkable.  2. Diabetes mellitus  3. Hypertension  4. CVA following coronary angiography in 2002  5. Gastroesophageal reflux disease  6. Asthma  7. Chronic kidney disease, echogenic kidneys by ultrasound but creatinine 1.2   PLAN:  1. Rule out myocardial infarction as you have done with serial markers  2. Myocardial perfusion study to look for evidence of ischemia   HPI: The patient is a very pleasant 66 year old who underwent coronary angiography in 2002 when she had similar symptoms of substernal chest pressure. The catheterization did not demonstrate significant coronary disease. The procedure was complicated by an embolic stroke. She subsequently had a stress test performed that did not demonstrate ischemia. She was unable to get her heart rate up and therefore needed a [facility] pharmacologic stress. She had a bad experience with this agent. She has done well since that time until yesterday when she developed severe substernal/epigastric pressure associated with nausea and diaphoresis. It lasted approximately 20 minutes before resolving slowly over that time frame. She has had no recurrence. Cardiac markers are negative. EKGs revealed poor R-wave progression V1 through V4. No acute ST-T wave changes noted. Office EKGs demonstrate poor R wave progression V1 through V3. An echocardiogram has already been performed this morning. She has had no recurrence of discomfort since being admitted to the hospital yesterday.   PMH:   Past Medical History  Diagnosis Date  . GERD (gastroesophageal reflux disease)   . Asthma   . Carpal tunnel syndrome   . Complication of anesthesia 1987      "affected my eyes; couldn't see anything but blurrs even the next day"  . Hypertension   . High cholesterol   . Angina 04/16/11    "that's what I'm here for"  . Pneumonia 04/17/11    "probably as many as 7 times"  . History of bronchitis   . Shortness of breath on exertion     "cause of my asthma"  . Type II diabetes mellitus   . Anemia 04/17/11    "long, long years ago"  . Headache   . Migraines   . Anxiety   . Panic attacks 04/17/11    "don't take anything for it"  . Renal disorder 04/17/11    "they are working at 60% capacity"  . Stroke 2002    residual "problem w/using the right word, left 5 lesions on my brain/MRI; long term memory loss"     PSH:   Past Surgical History  Procedure Date  . Vaginal hysterectomy 1977  . Cholecystectomy 1987  . Carpal tunnel release 2003-2010    "twice on left; once on right"  . Debridement tennis elbow 2010  . Cardiac catheterization 2002    Allergies:  Codeine; Erythromycin; Lisinopril; Penicillins; and Sulfonamide derivatives Prior to Admit Meds:   Prescriptions prior to admission  Medication Sig Dispense Refill  . albuterol (PROVENTIL HFA;VENTOLIN HFA) 108 (90 BASE) MCG/ACT inhaler Inhale 2 puffs into the lungs every 6 (six) hours as needed. For shortness of breath      . aspirin EC 81 MG tablet Take 81 mg by mouth daily.      Marland Kitchen estropipate (OGEN) 0.75 MG tablet Take 0.75 mg by mouth daily.      Marland Kitchen  insulin detemir (LEVEMIR) 100 UNIT/ML injection Inject 44 Units into the skin daily.      . Liraglutide (VICTOZA [place]) Inject 1.2 mg into the skin daily.      . ranitidine (ZANTAC) 150 MG tablet Take 300 mg by mouth 2 (two) times daily.      . rosuvastatin (CRESTOR) 20 MG tablet Take 20 mg by mouth daily.       Fam HX:    Family History  Problem Relation Age of Onset  . Coronary artery disease Father    Social HX:    History   Social History  . Marital Status: Married    Spouse Name: N/A    Number of Children: N/A  . Years of  Education: N/A   Occupational History  . Not on file.   Social History Main Topics  . Smoking status: Former Smoker -- 0.7 packs/day for 6 years    Types: Cigarettes    Quit date: 01/21/1980  . Smokeless tobacco: Never Used  . Alcohol Use: No  . Drug Use: No  . Sexually Active: Not Currently   Other Topics Concern  . Not on file   Social History Narrative  . No narrative on file     Review of Systems: Would be frightened to have another stress test with a [place]. Denies any new neurological symptoms. She has noticed exertional dyspnea over the last 6 months with climbing stairs with grocery [place]. She denies GI complaints. She has chronic "indigestion" that has not changed over the years. She has difficulty with word finding on occasion during conversation that she feels is related to the prior stroke.  Physical Exam: Blood pressure 111/6, pulse 65, temperature 98 F (36.7 C), temperature source Oral, resp. rate 18, height 5' 3.5" (1.613 m), weight 74.753 kg (164 lb 12.8 oz), SpO2 99.00%. Weight change:   Well-developed well-nourished and in no acute distress  Skin color is normal.  Neck exam reveals no JVD, carotid bruits, or thyromegaly.  Lungs are clear auscultation and percussion.  Cardiac exam reveals an S4 gallop but is otherwise unremarkable. No murmurs heard. Abdomen is soft. Bowel sounds are normal. No bruits are heard.  Upper lower extremities are without edema. Radial pulses popliteal pulses and dorsalis pedis pulses are 2+ and symmetric.  The neurological exam is unremarkable with the exception of halting speech on occasion. Labs:   Lab Results  Component Value Date   WBC 6.0 04/17/2011   HGB 13.3 04/17/2011   HCT 39.1 04/17/2011   MCV 92.2 04/17/2011   PLT 156 04/17/2011    Lab 04/17/11 0605  NA 143  K 3.4*  CL 106  CO2 28  BUN 18  CREATININE 1.11*  CALCIUM 9.6  PROT 5.9*  BILITOT 0.4  ALKPHOS 57  ALT 22  AST 19  GLUCOSE 225*   Lab Results    Component Value Date   CKTOTAL 57 04/17/2011   CKMB 1.8 04/17/2011   TROPONINI <0.30 04/17/2011     Lab Results  Component Value Date   CHOL 106 04/17/2011   Lab Results  Component Value Date   HDL 46 04/17/2011   Lab Results  Component Value Date   LDLCALC 37 04/17/2011   Lab Results  Component Value Date   TRIG 116 04/17/2011   Lab Results  Component Value Date   CHOLHDL 2.3 04/17/2011   No results found for this basename: LDLDIRECT      Radiology:  Dg Chest 2 View  04/16/2011  *  RADIOLOGY REPORT*  Clinical Data: Chest pain.  CHEST - 2 VIEW  Comparison: None  Findings: The cardiac silhouette, mediastinal and hilar contours are within normal limits.  The lungs are clear.  No pleural effusion.  The bony thorax is intact.  IMPRESSION: Normal chest x-ray.  Original Report Authenticated By: P. Loralie Champagne, M.D.   EKG:  Poor R-wave progression V1 through V4. Normal sinus rhythm. No acute ST-T wave abnormality is noted.    Lesleigh Noe 04/17/2011 11:55 AM

## 2011-04-17 NOTE — Progress Notes (Signed)
PCP: Dr. Tiburcio Pea Diabetic: Dr Sharl Ma  04/17/2011 [address] RN, [place] [phone] Met with patient to discuss discharge planning. She does not anticipate any need for home health services. She resides in a one level home with spouse. She reports ongoing follow up with Dr Sharl Ma and her A1C has decreased from 13.5 to 7.2 over the past 6 months. She has no problems with medications except the end of the year when she is in the donut hole.   04/17/11 Onnie Boer, RN, BSN 1443  UR COMPLETED

## 2011-04-17 NOTE — Progress Notes (Signed)
  Echocardiogram 2D Echocardiogram has been performed.  Dorena Cookey 04/17/2011, 12:06 PM

## 2011-04-18 ENCOUNTER — Inpatient Hospital Stay (HOSPITAL_COMMUNITY): Payer: Medicare Other

## 2011-04-18 MED ORDER — TECHNETIUM TC 99M TETROFOSMIN IV KIT
10.0000 | PACK | Freq: Once | INTRAVENOUS | Status: AC | PRN
  Administered 2011-04-18: 10 via INTRAVENOUS

## 2011-04-18 MED ORDER — REGADENOSON 0.4 MG/5ML IV SOLN
0.4000 mg | Freq: Once | INTRAVENOUS | Status: AC
  Administered 2011-04-18: 0.4 mg via INTRAVENOUS
  Filled 2011-04-18: qty 5

## 2011-04-18 MED ORDER — PANTOPRAZOLE SODIUM 40 MG PO TBEC: 40.0000 mg | DELAYED_RELEASE_TABLET | Freq: Every day | ORAL | Status: DC

## 2011-04-18 MED ORDER — TECHNETIUM TC 99M TETROFOSMIN IV KIT
30.0000 | PACK | Freq: Once | INTRAVENOUS | Status: AC | PRN
  Administered 2011-04-18: 30 via INTRAVENOUS

## 2011-04-18 NOTE — Progress Notes (Signed)
   CARE MANAGEMENT NOTE 04/18/2011  Patient:  Yolanda Saunders, Yolanda Saunders   Account Number:  [id number]  Date Initiated:  04/17/2011  Documentation initiated by:  Onnie Boer  Subjective/Objective Assessment:   PT WAS ADMITTED WITH CP     Action/Plan:   PROGRESSION OF CARE AND DISCHARGE PLANNING   Anticipated DC Date:  04/19/2011   Anticipated DC Plan:        DC Planning Services  CM consult      Choice offered to / List presented to:             Status of service:  Completed, signed off Medicare Important Message given?   (If response is "NO", the following Medicare IM given date fields will be blank) Date Medicare IM given:   Date Additional Medicare IM given:    Discharge Disposition:  HOME/SELF CARE  Per UR Regulation:  Reviewed for med. necessity/level of care/duration of stay  If discussed at Long Length of Stay Meetings, dates discussed:    Comments:  PCP: Dr. Tiburcio Pea Diabetic: Dr Sharl Ma  04/18/11 Onnie Boer, RN, BSN 1438 PT WAS DC'D TO HOME IWTH SELF CARE  04/17/2011 [address] RN, [place] [phone] Met with patient to discuss discharge planning. She does not anticipate any need for home health services. She resides in a one level home with spouse. She reports ongoing follow up with Dr Sharl Ma and her A1C has decreased from 13.5 to 7.2 over the past 6 months. She has no problems with medications except the end of the year when she is in the donut hole.  04/17/11 Onnie Boer, RN, BSN 1443  UR COMPLETED

## 2011-04-18 NOTE — Discharge Summary (Signed)
Admit date: 04/16/2011 Discharge date: 04/18/2011  Primary Care Physician:  Provider Not In System   Discharge Diagnoses:    . Chest pain, probably GERD. 04/16/2011   . DIABETES MELLITUS 09/13/2008   . HYPERLIPIDEMIA 09/13/2008   . HYPERTENSION 09/13/2008              DISCHARGE MEDICATION: Medication List  As of 04/18/2011 12:01 PM   STOP taking these medications         ranitidine 150 MG tablet         TAKE these medications         albuterol 108 (90 BASE) MCG/ACT inhaler   Commonly known as: PROVENTIL HFA;VENTOLIN HFA   Inhale 2 puffs into the lungs every 6 (six) hours as needed. For shortness of breath      aspirin EC 81 MG tablet   Take 81 mg by mouth daily.      estropipate 0.75 MG tablet   Commonly known as: OGEN   Take 0.75 mg by mouth daily.      insulin detemir 100 UNIT/ML injection   Commonly known as: LEVEMIR   Inject 44 Units into the skin daily.      pantoprazole 40 MG tablet   Commonly known as: PROTONIX   Take 1 tablet (40 mg total) by mouth daily.      rosuvastatin 20 MG tablet   Commonly known as: CRESTOR   Take 20 mg by mouth daily.      VICTOZA [place]   Inject 1.2 mg into the skin daily.              Consults: Treatment Team:  Lesleigh Noe, MD   SIGNIFICANT DIAGNOSTIC STUDIES:  Dg Chest 2 View  04/16/2011  *RADIOLOGY REPORT*  Clinical Data: Chest pain.  CHEST - 2 VIEW  Comparison: None  Findings: The cardiac silhouette, mediastinal and hilar contours are within normal limits.  The lungs are clear.  No pleural effusion.  The bony thorax is intact.  IMPRESSION: Normal chest x-ray.  Original Report Authenticated By: P. Loralie Champagne, M.D.   Nm Myocar Multi W/spect W/wall Motion / Ef  04/18/2011  *RADIOLOGY REPORT*  Clinical Data:  Chest pain.  History of diabetes, hypertension and asthma.  MYOCARDIAL IMAGING WITH SPECT (REST AND PHARMACOLOGIC-STRESS) GATED LEFT VENTRICULAR WALL MOTION STUDY LEFT VENTRICULAR EJECTION FRACTION   Technique:  Resting myocardial SPECT imaging was initially performed after intravenous administration of radiopharmaceutical. Myocardial SPECT was subsequently performed after additional radiopharmaceutical injection during pharmacologic-stress (Lexiscan)supervised by the Cardiology staff.  Quantitative gated imaging was also performed to evaluate left ventricular wall motion, and estimate left ventricular ejection fraction.  Radiopharmaceutical:  10 mCi Tc-61m Myoview at rest and 30 mCi during stress.  Comparison:  Findings:  SPECT images demonstrate normal left ventricular activity aside from mild apical thinning.  There are no suspicious fixed or reversible perfusion defects.  Gated cine images were reviewed on the workstation and demonstrate normal left ventricular wall motion and systolic thickening. The QGS ejection fraction measured at rest is 71% with an end diastolic volume of 65 ml and an end-systolic volume of 19 ml.  IMPRESSION:  Normal examination without evidence of pharmacologically induced myocardial ischemia.  The calculated left ventricular ejection fraction is 71%.  Original Report Authenticated By: Gerrianne Scale, M.D.     ECHO: - Left ventricle: The cavity size was normal. Systolic function was normal. The estimated ejection fraction was in the range of 60% to 65%. Wall  motion was normal; there were no regional wall motion abnormalities. - Pericardium, extracardiac: Features were not consistent with tamponade physiology.  Myoview: IMPRESSION:  Normal examination without evidence of pharmacologically induced  myocardial ischemia. The calculated left ventricular ejection  fraction is 71%.     BRIEF ADMITTING H & P: 66 year-old female with known history of diabetes mellitus2, asthma, hyperlipidemia and previous history of cardiac catheter in 2002 and had CVA at the time, chronic renal disease presented to the ER because of chest pain. Patient has been experiencing chest  pain over the last 3 days. Last 2 days the pain only last for a few seconds and was retrosternal. And today while patient was in the car with her husband around 4 PM patient started having chest pain which was more than what she had last 2 days. Patient felt like pressure retrosternal nonradiating like a bubble behind the sternum. The episode lasted for 15 minutes and got resolved by itself. After which they drove to the ER. Presently patient's cardiac enzymes EKG and chest x-rays are not showing any acute the EKG does show poor R-wave progression. In addition patient has been noticing intense fatigue for the last 2 days which increases with exertion. Denies any shortness of breath nausea diaphoresis palpitations dizziness abdominal pain focal deficits fever chills.  Hospital Course:  Chest Pain, probably GERD:  Patient was admitted to telemetry, cardiac enzymes negative times 3 , EKG poor R wave progression, no ST segment elevation. ECHO normal ejection fraction. Cardiology was consulted due to multiples risk factors. Myoview negative for ischemia. Pain probably related to GERD. I will discontinue ranitidine and start protonix. She needs to follow up with PCP, might need referral to gastroenterology for endoscopy if she hasn't had one recently.   We continue with same medications for all her others medical problems.   Disposition and Follow-up:  Discharge Orders    Future Orders Please Complete By Expires   Diet Carb Modified      Increase activity slowly        Follow-up Information    Follow up with Provider Not In System .          DISCHARGE EXAM:  General: Alert, awake, oriented x3, in no acute distress.  HEENT: No bruits, no goiter.  Heart: Regular rate and rhythm, without murmurs, rubs, gallops.  Lungs: CTA, bilateral air movement.  Abdomen: Soft, nontender, nondistended, positive bowel sounds.  Neuro: Grossly intact, nonfocal.  Extremities; no edema.    Blood pressure 134/79,  pulse 78, temperature 97.6 F (36.4 C), temperature source Oral, resp. rate 18, height 5' 3.5" (1.613 m), weight 69.718 kg (153 lb 11.2 oz), SpO2 100.00%.   Basename 04/17/11 0605 04/16/11 2338 04/16/11 1917  NA 143 -- 140  K 3.4* -- 4.0  CL 106 -- 101  CO2 28 -- 31  GLUCOSE 225* -- 205*  BUN 18 -- 18  CREATININE 1.11* 1.08 --  CALCIUM 9.6 -- 10.1  MG -- -- --  PHOS -- -- --    Basename 04/17/11 0605  AST 19  ALT 22  ALKPHOS 57  BILITOT 0.4  PROT 5.9*  ALBUMIN 3.3*    Basename 04/17/11 0605 04/16/11 2338 04/16/11 1917  WBC 6.0 7.3 --  NEUTROABS -- -- 6.5  HGB 13.3 15.2* --  HCT 39.1 44.0 --  MCV 92.2 91.3 --  PLT 156 189 --    Signed: Ivaan Liddy M.D. 04/18/2011, 12:01 PM

## 2011-04-18 NOTE — Progress Notes (Signed)
Pt received discharge instructions and new prescription. Stable to be DC'd home. No further questions. Duwaine Maxin, RN

## 2011-04-18 NOTE — Progress Notes (Signed)
Subjective:  Had 20 minutes of epigastric/substernal chest discomfort that was similar in presentation to 2002 and had normal angiography.  Last night had a good night. No CP.   Objective:  Vital Signs in the last 24 hours: Temp:  [97.4 F (36.3 C)-98 F (36.7 C)] 97.6 F (36.4 C) (03/29 0500) Pulse Rate:  [70-73] 70  (03/29 0500) Resp:  [18-19] 18  (03/29 0500) BP: (107-129)/(56-81) 110/56 mmHg (03/29 0500) SpO2:  [100 %] 100 % (03/29 0500) Weight:  [69.718 kg (153 lb 11.2 oz)] 69.718 kg (153 lb 11.2 oz) (03/29 0500)  Physical Exam: General: Well developed, well nourished, in no acute distress. Head:  Normocephalic and atraumatic. Lungs: Clear to auscultation and percussion. Heart: Normal S1 and S2.  No murmur, rubs or gallops.  Pulses: Pulses normal in all 4 extremities. Abdomen: soft, non-tender, positive bowel sounds. Extremities: No clubbing or cyanosis. No edema. Neurologic: Alert and oriented x 3.    Lab Results:  Basename 04/17/11 0605 04/16/11 2338  WBC 6.0 7.3  HGB 13.3 15.2*  PLT 156 189    Basename 04/17/11 0605 04/16/11 2338 04/16/11 1917  NA 143 -- 140  K 3.4* -- 4.0  CL 106 -- 101  CO2 28 -- 31  GLUCOSE 225* -- 205*  BUN 18 -- 18  CREATININE 1.11* 1.08 --    Basename 04/17/11 1603 04/17/11 0605  TROPONINI <0.30 <0.30   Hepatic Function Panel  Basename 04/17/11 0605  PROT 5.9*  ALBUMIN 3.3*  AST 19  ALT 22  ALKPHOS 57  BILITOT 0.4  BILIDIR --  IBILI --    Basename 04/17/11 0605  CHOL 106   No results found for this basename: PROTIME in the last 72 hours  Imaging: Dg Chest 2 View  04/16/2011  *RADIOLOGY REPORT*  Clinical Data: Chest pain.  CHEST - 2 VIEW  Comparison: None  Findings: The cardiac silhouette, mediastinal and hilar contours are within normal limits.  The lungs are clear.  No pleural effusion.  The bony thorax is intact.  IMPRESSION: Normal chest x-ray.  Original Report Authenticated By: P. Loralie Champagne, M.D.    Personally viewed.   Assessment/Plan:   Chest pain  - Awaiting results of nuclear stress test  -Possible GI etiology, however several cardiac risk factors including diabetes, hypertension, hyperlipidemia, age.  - Echocardiogram reassuring with normal EF.  DIABETES MELLITUS  -Per primary team. Elevated. Uncontrolled.  A1c 7.7.   HYPERLIPIDEMIA  - Continue with atorvastatin.   HYPERTENSION - per primary team  IF stress test is low risk, OK to dc home.     Gustav Knueppel 04/18/2011, 8:50 AM

## 2011-10-18 ENCOUNTER — Emergency Department (HOSPITAL_COMMUNITY)
Admission: EM | Admit: 2011-10-18 | Discharge: 2011-10-18 | Disposition: A | Discharge status: Home / Self Care | Payer: Medicare Other | Attending: Emergency Medicine | Admitting: Emergency Medicine

## 2011-10-18 ENCOUNTER — Emergency Department (HOSPITAL_COMMUNITY): Payer: Medicare Other

## 2011-10-18 ENCOUNTER — Encounter (HOSPITAL_COMMUNITY): Payer: Self-pay | Admitting: Emergency Medicine

## 2011-10-18 DIAGNOSIS — K6289 Other specified diseases of anus and rectum: Secondary | ICD-10-CM | POA: Insufficient documentation

## 2011-10-18 DIAGNOSIS — K219 Gastro-esophageal reflux disease without esophagitis: Secondary | ICD-10-CM | POA: Insufficient documentation

## 2011-10-18 DIAGNOSIS — E119 Type 2 diabetes mellitus without complications: Secondary | ICD-10-CM | POA: Insufficient documentation

## 2011-10-18 DIAGNOSIS — Z7982 Long term (current) use of aspirin: Secondary | ICD-10-CM | POA: Insufficient documentation

## 2011-10-18 DIAGNOSIS — J45909 Unspecified asthma, uncomplicated: Secondary | ICD-10-CM | POA: Insufficient documentation

## 2011-10-18 DIAGNOSIS — R1032 Left lower quadrant pain: Secondary | ICD-10-CM | POA: Insufficient documentation

## 2011-10-18 DIAGNOSIS — E789 Disorder of lipoprotein metabolism, unspecified: Secondary | ICD-10-CM | POA: Insufficient documentation

## 2011-10-18 DIAGNOSIS — I10 Essential (primary) hypertension: Secondary | ICD-10-CM | POA: Insufficient documentation

## 2011-10-18 DIAGNOSIS — Z8673 Personal history of transient ischemic attack (TIA), and cerebral infarction without residual deficits: Secondary | ICD-10-CM | POA: Insufficient documentation

## 2011-10-18 DIAGNOSIS — Z79899 Other long term (current) drug therapy: Secondary | ICD-10-CM | POA: Insufficient documentation

## 2011-10-18 DIAGNOSIS — R109 Unspecified abdominal pain: Secondary | ICD-10-CM

## 2011-10-18 DIAGNOSIS — K5900 Constipation, unspecified: Secondary | ICD-10-CM

## 2011-10-18 HISTORY — DX: Irritable bowel syndrome, unspecified: K58.9

## 2011-10-18 LAB — URINALYSIS, ROUTINE W REFLEX MICROSCOPIC
Bilirubin Urine: NEGATIVE
Glucose, UA: 100 mg/dL — AB
Hgb urine dipstick: NEGATIVE
Ketones, ur: NEGATIVE mg/dL
Leukocytes, UA: NEGATIVE
Nitrite: NEGATIVE
Protein, ur: NEGATIVE mg/dL
Specific Gravity, Urine: 1.019 (ref 1.005–1.030)
Urobilinogen, UA: 1 mg/dL (ref 0.0–1.0)
pH: 7.5 (ref 5.0–8.0)

## 2011-10-18 LAB — CBC WITH DIFFERENTIAL/PLATELET
Basophils Absolute: 0 10*3/uL (ref 0.0–0.1)
Basophils Relative: 0 % (ref 0–1)
Eosinophils Absolute: 0.2 10*3/uL (ref 0.0–0.7)
Eosinophils Relative: 3 % (ref 0–5)
HCT: 43.2 % (ref 36.0–46.0)
Hemoglobin: 14.5 g/dL (ref 12.0–15.0)
Lymphocytes Relative: 20 % (ref 12–46)
Lymphs Abs: 1.6 10*3/uL (ref 0.7–4.0)
MCH: 30.7 pg (ref 26.0–34.0)
MCHC: 33.6 g/dL (ref 30.0–36.0)
MCV: 91.3 fL (ref 78.0–100.0)
Monocytes Absolute: 0.6 10*3/uL (ref 0.1–1.0)
Monocytes Relative: 7 % (ref 3–12)
Neutro Abs: 5.7 10*3/uL (ref 1.7–7.7)
Neutrophils Relative %: 70 % (ref 43–77)
Platelets: 169 10*3/uL (ref 150–400)
RBC: 4.73 MIL/uL (ref 3.87–5.11)
RDW: 13.2 % (ref 11.5–15.5)
WBC: 8.1 10*3/uL (ref 4.0–10.5)

## 2011-10-18 LAB — BASIC METABOLIC PANEL
BUN: 19 mg/dL (ref 6–23)
CO2: 28 mEq/L (ref 19–32)
Calcium: 9.5 mg/dL (ref 8.4–10.5)
Chloride: 105 mEq/L (ref 96–112)
Creatinine, Ser: 1.09 mg/dL (ref 0.50–1.10)
GFR calc Af Amer: 59 mL/min — ABNORMAL LOW (ref >90)
GFR calc non Af Amer: 51 mL/min — ABNORMAL LOW (ref >90)
Glucose, Bld: 206 mg/dL — ABNORMAL HIGH (ref 70–99)
Potassium: 3.9 mEq/L (ref 3.5–5.1)
Sodium: 141 mEq/L (ref 135–145)

## 2011-10-18 MED ORDER — POLYETHYLENE GLYCOL 3350 17 G PO PACK: 17.0000 g | PACK | Freq: Three times a day (TID) | ORAL | Status: DC

## 2011-10-18 MED ORDER — DIAZEPAM 5 MG/ML IJ SOLN
5.0000 mg | Freq: Once | INTRAMUSCULAR | Status: AC
  Administered 2011-10-18: 5 mg via INTRAVENOUS
  Filled 2011-10-18: qty 2

## 2011-10-18 MED ORDER — IOHEXOL 300 MG/ML  SOLN: 20.0000 mL | INTRAMUSCULAR | Status: AC

## 2011-10-18 MED ORDER — IOHEXOL 300 MG/ML  SOLN
100.0000 mL | Freq: Once | INTRAMUSCULAR | Status: AC | PRN
  Administered 2011-10-18: 100 mL via INTRAVENOUS

## 2011-10-18 MED ORDER — SODIUM CHLORIDE 0.9 % IV BOLUS (SEPSIS)
1000.0000 mL | Freq: Once | INTRAVENOUS | Status: AC
  Administered 2011-10-18: 1000 mL via INTRAVENOUS

## 2011-10-18 NOTE — ED Notes (Signed)
Back from x-ray CT scan completed

## 2011-10-18 NOTE — ED Notes (Signed)
Patient was discharged with instructions with her husband. She verbalizes and understanding. Her diagnosis is constipation. Discussed with patient interventions to relieve constipation.

## 2011-10-18 NOTE — ED Notes (Signed)
Pt c/o lower abdominal pain onset 2200 last night. Pt reports nausea. Pt last normal BM 1 week ago. Pt has tried suppositories without relief.

## 2011-10-18 NOTE — ED Notes (Signed)
Patient to CT via strecher

## 2011-10-23 NOTE — ED Provider Notes (Signed)
History    66yF with abdominal pain. Gradual onset last night. LLQ. Does not radiate. Crampy. No appreciable exacerbating or relieving factors. No urinary complaints. Mild nausea. No vomitiing. No fever or chills. NO diarrhea. Surgical hx significant for cholecystectomy.  CSN: [id number]  Arrival date & time 10/18/11  0745   First MD Initiated Contact with Patient 10/18/11 [phone]      Chief Complaint  Patient presents with  . Abdominal Pain  . Rectal Pain    (Consider location/radiation/quality/duration/timing/severity/associated sxs/prior treatment) HPI  Past Medical History  Diagnosis Date  . GERD (gastroesophageal reflux disease)   . Asthma   . Carpal tunnel syndrome   . Complication of anesthesia 1987    "affected my eyes; couldn't see anything but blurrs even the next day"  . Hypertension   . High cholesterol   . Angina 04/16/11    "that's what I'm here for"  . Pneumonia 04/17/11    "probably as many as 7 times"  . History of bronchitis   . Shortness of breath on exertion     "cause of my asthma"  . Type II diabetes mellitus   . Anemia 04/17/11    "long, long years ago"  . Headache   . Migraines   . Anxiety   . Panic attacks 04/17/11    "don't take anything for it"  . Renal disorder 04/17/11    "they are working at 60% capacity"  . Stroke 2002    residual "problem w/using the right word, left 5 lesions on my brain/MRI; long term memory loss"  . IBS (irritable bowel syndrome)     Past Surgical History  Procedure Date  . Vaginal hysterectomy 1977  . Cholecystectomy 1987  . Carpal tunnel release 2003-2010    "twice on left; once on right"  . Debridement tennis elbow 2010  . Cardiac catheterization 2002    Family History  Problem Relation Age of Onset  . Coronary artery disease Father     History  Substance Use Topics  . Smoking status: Former Smoker -- 0.7 packs/day for 6 years    Types: Cigarettes    Quit date: 01/21/1980  . Smokeless tobacco: Never  Used  . Alcohol Use: No    OB History    Grav Para Term Preterm Abortions TAB SAB Ect Mult Living                  Review of Systems   Review of symptoms negative unless otherwise noted in HPI.   Allergies  Codeine; Erythromycin; Lisinopril; Penicillins; and Sulfonamide derivatives  Home Medications   Current Outpatient Rx  Name Route Sig Dispense Refill  . ALBUTEROL SULFATE HFA 108 (90 BASE) MCG/ACT IN AERS Inhalation Inhale 2 puffs into the lungs every 6 (six) hours as needed. For shortness of breath    . ASPIRIN EC 81 MG PO TBEC Oral Take 81 mg by mouth daily.    Marland Kitchen CALCIUM + D PO Oral Take 1 tablet by mouth daily.    Marland Kitchen ESTROPIPATE 0.75 MG PO TABS Oral Take 0.75 mg by mouth daily.    . INSULIN DETEMIR 100 UNIT/ML [place] SOLN Subcutaneous Inject 44 Units into the skin daily.    Marland Kitchen VICTOZA [place] Subcutaneous Inject 1.2 mg into the skin daily.    Marland Kitchen RANITIDINE HCL 300 MG PO TABS Oral Take 300 mg by mouth 2 (two) times daily.    Marland Kitchen ROSUVASTATIN CALCIUM 20 MG PO TABS Oral Take 20 mg by mouth daily.    Marland Kitchen  POLYETHYLENE GLYCOL 3350 PO PACK Oral Take 17 g by mouth 3 (three) times daily. 14 each 0    Until you have a bowel movement    BP 129/63  Pulse 66  Temp 98.6 F (37 C) (Oral)  Resp 18  SpO2 99%  Physical Exam  Nursing note and vitals reviewed. Constitutional: She appears well-developed and well-nourished. No distress.       Laying in bed. NAd.  HENT:  Head: Normocephalic and atraumatic.  Eyes: Conjunctivae normal are normal. Right eye exhibits no discharge. Left eye exhibits no discharge.  Neck: Neck supple.  Cardiovascular: Normal rate, regular rhythm and normal heart sounds.  Exam reveals no gallop and no friction rub.   No murmur heard. Pulmonary/Chest: Effort normal and breath sounds normal. No respiratory distress.  Abdominal: Soft. She exhibits no distension and no mass. There is tenderness. There is no rebound and no guarding.       Mild TTP LLQ w/o rebound or guarding    Genitourinary:       No cva tenderness  Musculoskeletal: She exhibits no edema and no tenderness.  Neurological: She is alert.  Skin: Skin is warm and dry.  Psychiatric: She has a normal mood and affect. Her behavior is normal. Thought content normal.    ED Course  Procedures (including critical care time)  Labs Reviewed  BASIC METABOLIC PANEL - Abnormal; Notable for the following:    Glucose, Bld 206 (*)     GFR calc non Af Amer 51 (*)     GFR calc Af Amer 59 (*)     All other components within normal limits  URINALYSIS, ROUTINE W REFLEX MICROSCOPIC - Abnormal; Notable for the following:    Glucose, UA 100 (*)     All other components within normal limits  LIPASE, BLOOD - Abnormal; Notable for the following:    Lipase 72 (*)     All other components within normal limits  CBC WITH DIFFERENTIAL  LAB REPORT - SCANNED   No results found.   1. Abdominal pain   2. Constipation       MDM  66yF with abdominal pain. Low suspicion for emergent etiology. Mild tenderness without peritoneal signs. HD stable. Mild elevation in lipase but doesn't clinically correlate to symptoms. CT w/o out explaining pathology aside from perhaps constipation. Feel safe for DC. Return precautions discussed.        Raeford Razor, MD 10/23/11 1030

## 2013-07-13 ENCOUNTER — Other Ambulatory Visit: Payer: Self-pay | Admitting: Family Medicine

## 2013-07-13 DIAGNOSIS — R269 Unspecified abnormalities of gait and mobility: Secondary | ICD-10-CM

## 2013-07-18 ENCOUNTER — Ambulatory Visit
Admission: RE | Admit: 2013-07-18 | Discharge: 2013-07-18 | Disposition: A | Discharge status: Home / Self Care | Payer: Medicare Other | Source: Ambulatory Visit | Attending: Family Medicine | Admitting: Family Medicine

## 2013-07-18 DIAGNOSIS — R269 Unspecified abnormalities of gait and mobility: Secondary | ICD-10-CM

## 2013-10-19 ENCOUNTER — Encounter: Payer: Self-pay | Admitting: General Surgery

## 2013-10-19 ENCOUNTER — Telehealth: Payer: Self-pay | Admitting: General Surgery

## 2013-10-19 NOTE — Telephone Encounter (Signed)
completed

## 2013-11-27 ENCOUNTER — Encounter (HOSPITAL_COMMUNITY): Payer: Self-pay | Admitting: Emergency Medicine

## 2013-11-27 ENCOUNTER — Emergency Department (HOSPITAL_COMMUNITY)
Admission: EM | Admit: 2013-11-27 | Discharge: 2013-11-27 | Disposition: A | Discharge status: Home / Self Care | Payer: Medicare Other | Attending: Emergency Medicine | Admitting: Emergency Medicine

## 2013-11-27 DIAGNOSIS — Z862 Personal history of diseases of the blood and blood-forming organs and certain disorders involving the immune mechanism: Secondary | ICD-10-CM | POA: Diagnosis not present

## 2013-11-27 DIAGNOSIS — H9201 Otalgia, right ear: Secondary | ICD-10-CM | POA: Diagnosis not present

## 2013-11-27 DIAGNOSIS — J45909 Unspecified asthma, uncomplicated: Secondary | ICD-10-CM | POA: Insufficient documentation

## 2013-11-27 DIAGNOSIS — E780 Pure hypercholesterolemia: Secondary | ICD-10-CM | POA: Diagnosis not present

## 2013-11-27 DIAGNOSIS — M542 Cervicalgia: Secondary | ICD-10-CM | POA: Insufficient documentation

## 2013-11-27 DIAGNOSIS — Z87891 Personal history of nicotine dependence: Secondary | ICD-10-CM | POA: Diagnosis not present

## 2013-11-27 DIAGNOSIS — I209 Angina pectoris, unspecified: Secondary | ICD-10-CM | POA: Diagnosis not present

## 2013-11-27 DIAGNOSIS — R51 Headache: Secondary | ICD-10-CM | POA: Insufficient documentation

## 2013-11-27 DIAGNOSIS — Z8701 Personal history of pneumonia (recurrent): Secondary | ICD-10-CM | POA: Insufficient documentation

## 2013-11-27 DIAGNOSIS — N183 Chronic kidney disease, stage 3 (moderate): Secondary | ICD-10-CM | POA: Diagnosis not present

## 2013-11-27 DIAGNOSIS — Z7982 Long term (current) use of aspirin: Secondary | ICD-10-CM | POA: Insufficient documentation

## 2013-11-27 DIAGNOSIS — F419 Anxiety disorder, unspecified: Secondary | ICD-10-CM | POA: Insufficient documentation

## 2013-11-27 DIAGNOSIS — M797 Fibromyalgia: Secondary | ICD-10-CM | POA: Diagnosis not present

## 2013-11-27 DIAGNOSIS — Z8669 Personal history of other diseases of the nervous system and sense organs: Secondary | ICD-10-CM | POA: Diagnosis not present

## 2013-11-27 DIAGNOSIS — Z9889 Other specified postprocedural states: Secondary | ICD-10-CM | POA: Diagnosis not present

## 2013-11-27 DIAGNOSIS — I129 Hypertensive chronic kidney disease with stage 1 through stage 4 chronic kidney disease, or unspecified chronic kidney disease: Secondary | ICD-10-CM | POA: Insufficient documentation

## 2013-11-27 DIAGNOSIS — Z794 Long term (current) use of insulin: Secondary | ICD-10-CM | POA: Diagnosis not present

## 2013-11-27 DIAGNOSIS — Z79899 Other long term (current) drug therapy: Secondary | ICD-10-CM | POA: Diagnosis not present

## 2013-11-27 DIAGNOSIS — E119 Type 2 diabetes mellitus without complications: Secondary | ICD-10-CM | POA: Diagnosis not present

## 2013-11-27 DIAGNOSIS — K219 Gastro-esophageal reflux disease without esophagitis: Secondary | ICD-10-CM | POA: Insufficient documentation

## 2013-11-27 DIAGNOSIS — Z880 Allergy status to penicillin: Secondary | ICD-10-CM | POA: Diagnosis not present

## 2013-11-27 DIAGNOSIS — Z8673 Personal history of transient ischemic attack (TIA), and cerebral infarction without residual deficits: Secondary | ICD-10-CM | POA: Insufficient documentation

## 2013-11-27 MED ORDER — ACETAMINOPHEN 325 MG PO TABS
325.0000 mg | ORAL_TABLET | Freq: Once | ORAL | Status: AC
  Administered 2013-11-27: 325 mg via ORAL
  Filled 2013-11-27: qty 1

## 2013-11-27 MED ORDER — ANTIPYRINE-BENZOCAINE 5.4-1.4 % OT SOLN
3.0000 [drp] | Freq: Once | OTIC | Status: AC
  Administered 2013-11-27: 3 [drp] via OTIC
  Filled 2013-11-27: qty 10

## 2013-11-27 NOTE — ED Provider Notes (Signed)
CSN: [id number]     Arrival date & time 11/27/13  0751 History   First MD Initiated Contact with Patient 11/27/13 [phone]     Chief Complaint  Patient presents with  . Otalgia   Yolanda Saunders is a 68 y.o. female with history of vertigo, diabetes, GERD, migraine headaches, chronic kidney disease, fibromyalgia and anxiety who presents to the ED with her husband complaining of right ear pain and a headache the past 3 days. She complains of right ear ache at 5 out of 10 that radiates down into her neck and up into the right side of her head. She describes a sensation of fluid or pressure in her right ear. She reports the pain is worse lying down. She took Tylenol yesterday without relief. She reports some nausea but denies vomiting or abdominal pain. She reports rhinorrhea but this is chronic for her. She said she had an MRI, ordered by her PCP for her vertigo, about a month ago which was normal. She reports she's been very busy lately getting ready for her granddaughter's party. 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 She also reports she's felt anxious due to getting ready for the party, and that this might be contributing to her ear pain. She denies history of ear infections or ear problems. She denies fevers, chills, changes to her vision, sinus congestion, ear discharge, sore throat, SOB, chest pain, shortness of breath, abdominal pain, changes to her bowel or bladder habits, numbness, tingling, weakness, loss of bowel or bladder control, loss of coordination, or tinnitus. She has not been around swimming pools or submerging her head underwater.  (Consider location/radiation/quality/duration/timing/severity/associated sxs/prior Treatment) Patient is a 68 y.o. female presenting with ear pain. The history is provided by the patient and the spouse.  Otalgia Location:  Right Behind ear:  No abnormality Quality:  Pressure Severity:  Moderate Onset quality:  Gradual Duration:  3 days Timing:   Constant Progression:  Worsening Chronicity:  New Context: not direct blow, not elevation change, not foreign body in ear, not loud noise and no water in ear   Relieved by:  Position Worsened by:  Position Ineffective treatments:  OTC medications Associated symptoms: headaches and neck pain   Associated symptoms: no abdominal pain, no congestion, no cough, no diarrhea, no ear discharge, no fever, no hearing loss, no rash, no sore throat, no tinnitus and no vomiting     Past Medical History  Diagnosis Date  . GERD (gastroesophageal reflux disease)   . Asthma   . Carpal tunnel syndrome   . Complication of anesthesia 1987    "affected my eyes; couldn't see anything but blurrs even the next day"  . Hypertension   . High cholesterol   . Angina 04/16/11    "that's what I'm here for"  . Pneumonia 04/17/11    "probably as many as 7 times"  . History of bronchitis   . Shortness of breath on exertion     "cause of my asthma"  . Type II diabetes mellitus   . Anemia 04/17/11    "long, long years ago"  . Headache(784.0)   . Migraines   . Anxiety   . Panic attacks 04/17/11    "don't take anything for it"  . Stroke 2002    residual "problem w/using the right word, left 5 lesions on my brain/MRI; long term memory loss"  . IBS (irritable bowel syndrome)   . Edema   . Depression   . CVA (cerebral infarction)  After cardiac catheter 02/2000  . Cataracts, bilateral   . Fibromyalgia   . Renal disorder 04/17/11    "they are working at 60% capacity"  . CKD (chronic kidney disease), stage III    Past Surgical History  Procedure Laterality Date  . Vaginal hysterectomy  1977  . Cholecystectomy  1987  . Carpal tunnel release  2003-2010    "twice on left; once on right"  . Debridement tennis elbow  2010  . Cardiac catheterization  2002   Family History  Problem Relation Age of Onset  . Coronary artery disease Father   . Diabetes Mellitus I Father   . CVA Mother   . Stroke Mother   .  Diabetes Mellitus I Mother   . Diabetes Mellitus I Sister   . Diabetes Mellitus I Brother   . Diabetes Mellitus I Maternal Grandmother   . Diabetes Mellitus I Maternal Grandfather   . Diabetes Mellitus I Paternal Grandmother   . Diabetes Mellitus I Paternal Grandfather   . Diabetes Mellitus I Brother   . Aortic aneurysm Son    History  Substance Use Topics  . Smoking status: Former Smoker -- 0.75 packs/day for 6 years    Types: Cigarettes    Quit date: 01/21/1980  . Smokeless tobacco: Never Used  . Alcohol Use: No   OB History    No data available     Review of Systems  Constitutional: Negative for fever and chills.  HENT: Positive for ear pain. Negative for congestion, dental problem, ear discharge, facial swelling, hearing loss, mouth sores, postnasal drip, sinus pressure, sneezing, sore throat, tinnitus, trouble swallowing and voice change.   Eyes: Negative for pain, discharge, redness, itching and visual disturbance.  Respiratory: Negative for cough, shortness of breath and wheezing.   Cardiovascular: Negative for chest pain, palpitations and leg swelling.  Gastrointestinal: Negative for nausea, vomiting, abdominal pain, diarrhea, constipation and blood in stool.  Genitourinary: Negative for dysuria, urgency, frequency and hematuria.  Musculoskeletal: Positive for neck pain. Negative for myalgias, back pain and gait problem.  Skin: Negative for color change, rash and wound.  Neurological: Positive for headaches. Negative for syncope, facial asymmetry, speech difficulty, weakness, light-headedness and numbness.  Psychiatric/Behavioral: Positive for sleep disturbance. Negative for suicidal ideas and confusion. The patient is nervous/anxious.   All other systems reviewed and are negative.     Allergies  Codeine; Erythromycin; Lisinopril; Penicillins; Sulfonamide derivatives; Actos; Amlodipine; Benicar; Byetta 10 mcg pen; Glimepiride; Glipizide; Glyburide-metformin; Humalog;  Invokana; Januvia; Reglan; Septra; Spironolactone; Victoza; Zocor; Losartan potassium; and Novolin r  Home Medications   Prior to Admission medications   Medication Sig Start Date End Date Taking? Authorizing Provider  albuterol (PROVENTIL HFA;VENTOLIN HFA) 108 (90 BASE) MCG/ACT inhaler Inhale 2 puffs into the lungs every 6 (six) hours as needed. For shortness of breath   Yes Historical Provider, MD  aspirin EC 81 MG tablet Take 81 mg by mouth daily.   Yes Historical Provider, MD  Calcium Carbonate-Vitamin D (CALCIUM + D PO) Take 1 tablet by mouth daily.   Yes Historical Provider, MD  furosemide (LASIX) 20 MG tablet Take 20 mg by mouth daily as needed for fluid.    Yes Historical Provider, MD  insulin detemir (LEVEMIR) 100 UNIT/ML injection Inject 64 Units into the skin 2 (two) times daily.    Yes Historical Provider, MD  ranitidine (ZANTAC) 300 MG tablet Take 300 mg by mouth 2 (two) times daily.   Yes Historical Provider, MD  rosuvastatin (CRESTOR)  20 MG tablet Take 20 mg by mouth daily.    Historical Provider, MD   BP 144/69 mmHg  Pulse 68  Temp(Src) 98.3 F (36.8 C) (Oral)  Resp 15  Ht 5\' 4"  (1.626 m)  Wt 175 lb (79.379 kg)  BMI 30.02 kg/m2  SpO2 99% Physical Exam  Constitutional: She is oriented to person, place, and time. She appears well-developed and well-nourished. No distress.  HENT:  Head: Normocephalic and atraumatic.  Right Ear: External ear normal.  Left Ear: External ear normal.  Nose: Nose normal.  Mouth/Throat: Oropharynx is clear and moist. No oropharyngeal exudate.  Bilateral tympanic membranes are pearly gray without erythema or loss of landmarks. No inner ear fluid noted. No cerumen impaction. No ear discharge. No tenderness to palpation of her external ear. No mastoid tenderness. No sinus tenderness to palpation. Oropharynx is clear and moist without exudates. No evidence of dental abscess or infection.   Eyes: Conjunctivae and EOM are normal. Pupils are equal,  round, and reactive to light. Right eye exhibits no discharge. Left eye exhibits no discharge. No scleral icterus.  Neck: Normal range of motion. Neck supple.  Patient is full range of motion of her neck. No posterior or anterior cervical lymphadenopathy. No bony tenderness.  Cardiovascular: Normal rate, regular rhythm, normal heart sounds and intact distal pulses.  Exam reveals no gallop and no friction rub.   No murmur heard. Pulmonary/Chest: Effort normal and breath sounds normal. No respiratory distress. She has no wheezes. She has no rales.  Abdominal: Soft. There is no tenderness.  Musculoskeletal: Normal range of motion. She exhibits no edema.  No bony tenderness. Full neck range of motion. Mild tenderness to the musculature of the right side of the neck  Lymphadenopathy:    She has no cervical adenopathy.  Neurological: She is alert and oriented to person, place, and time. She has normal reflexes. She displays normal reflexes. No cranial nerve deficit. She exhibits normal muscle tone. Coordination normal.  Cranial 2 through 12 intact bilaterally. Finger to nose and rapid alternating movements intact bilaterally. Patient is able to ambulate without assistance or difficulty. Strength 5 out of 5 in her bilateral upper and lower extremities. Patellar DTRs intact bilaterally.  Skin: Skin is warm and dry. No rash noted. She is not diaphoretic. No erythema. No pallor.  Psychiatric: Her behavior is normal. Her mood appears anxious. She does not exhibit a depressed mood.  Patient appears slightly anxious.  Nursing note and vitals reviewed.   ED Course  Procedures (including critical care time) Labs Review Labs Reviewed - No data to display  Imaging Review No results found.   EKG Interpretation None      Filed Vitals:   11/27/13 0830 11/27/13 0845 11/27/13 0900 11/27/13 0915  BP: 111/90 136/79 119/85 144/69  Pulse: 69 67 67 68  Temp:      TempSrc:      Resp: 15 13 14 15   Height:       Weight:      SpO2: 100% 99% 99% 99%     MDM   Meds given in ED:  Medications  acetaminophen (TYLENOL) tablet 325 mg (325 mg Oral Given 11/27/13 0928)  antipyrine-benzocaine (AURALGAN) otic solution 3-4 drop (3 drops Right Ear Given 11/27/13 4540)    Discharge Medication List as of 11/27/2013  9:14 AM      Final diagnoses:  Otalgia of right ear   Patient presents the ED complaining of 3 days of right ear pain that  radiates down her neck. There are no neuro deficits noted on exam. She is afebrile. Her bilateral tympanic membranes are with no signs of erythema or loss of landmarks. She had a recent MRI with no acute findings. Patient reports she's been under lots of stress recently and this may be contributing to her symptoms. Patient given Auralgan in the ED for her ear pain. I suggested the patient try Zyrtec-D at home. Advised her to follow-up with her primary care physician in the next 2 days. Advised the patient to return to the ED with new or worsening symptoms or new concerns. Patient verbalized understanding and agreement with plan. Patient discussed and evaluated in conjunction with Dr. Denton LankSteinl who agrees with assessment and plan.     Lawana ChambersWilliam Duncan Eylin Pontarelli, PA 11/27/13 1345  Suzi RootsKevin E Steinl, MD 12/02/13 1901

## 2013-11-27 NOTE — ED Notes (Signed)
Pt from home with c/o right ear pain and headache x 3 days.  Pt reports no injury to ear.  Pt in NAD, A&O.

## 2013-11-27 NOTE — Discharge Instructions (Signed)
Otalgia °The most common reason for this in children is an infection of the middle ear. Pain from the middle ear is usually caused by a build-up of fluid and pressure behind the eardrum. Pain from an earache can be sharp, dull, or burning. The pain may be temporary or constant. The middle ear is connected to the nasal passages by a short narrow tube called the Eustachian tube. The Eustachian tube allows fluid to drain out of the middle ear, and helps keep the pressure in your ear equalized. °CAUSES  °A cold or allergy can block the Eustachian tube with inflammation and the build-up of secretions. This is especially likely in small children, because their Eustachian tube is shorter and more horizontal. When the Eustachian tube closes, the normal flow of fluid from the middle ear is stopped. Fluid can accumulate and cause stuffiness, pain, hearing loss, and an ear infection if germs start growing in this area. °SYMPTOMS  °The symptoms of an ear infection may include fever, ear pain, fussiness, increased crying, and irritability. Many children will have temporary and minor hearing loss during and right after an ear infection. Permanent hearing loss is rare, but the risk increases the more infections a child has. Other causes of ear pain include retained water in the outer ear canal from swimming and bathing. °Ear pain in adults is less likely to be from an ear infection. Ear pain may be referred from other locations. Referred pain may be from the joint between your jaw and the skull. It may also come from a tooth problem or problems in the neck. Other causes of ear pain include: °· A foreign body in the ear. °· Outer ear infection. °· Sinus infections. °· Impacted ear wax. °· Ear injury. °· Arthritis of the jaw or TMJ problems. °· Middle ear infection. °· Tooth infections. °· Sore throat with pain to the ears. °DIAGNOSIS  °Your caregiver can usually make the diagnosis by examining you. Sometimes other special studies,  including x-rays and lab work may be necessary. °TREATMENT  °· If antibiotics were prescribed, use them as directed and finish them even if you or your child's symptoms seem to be improved. °· Sometimes PE tubes are needed in children. These are little plastic tubes which are put into the eardrum during a simple surgical procedure. They allow fluid to drain easier and allow the pressure in the middle ear to equalize. This helps relieve the ear pain caused by pressure changes. °HOME CARE INSTRUCTIONS  °· Only take over-the-counter or prescription medicines for pain, discomfort, or fever as directed by your caregiver. DO NOT GIVE CHILDREN ASPIRIN because of the association of Reye's Syndrome in children taking aspirin. °· Use a cold pack applied to the outer ear for 15-20 minutes, 03-04 times per day or as needed may reduce pain. Do not apply ice directly to the skin. You may cause frost bite. °· Over-the-counter ear drops used as directed may be effective. Your caregiver may sometimes prescribe ear drops. °· Resting in an upright position may help reduce pressure in the middle ear and relieve pain. °· Ear pain caused by rapidly descending from high altitudes can be relieved by swallowing or chewing gum. Allowing infants to suck on a bottle during airplane travel can help. °· Do not smoke in the house or near children. If you are unable to quit smoking, smoke outside. °· Control allergies. °SEEK IMMEDIATE MEDICAL CARE IF:  °· You or your child are becoming sicker. °· Pain or fever   relief is not obtained with medicine.  You or your child's symptoms (pain, fever, or irritability) do not improve within 24 to 48 hours or as instructed.  Severe pain suddenly stops hurting. This may indicate a ruptured eardrum.  You or your children develop new problems such as severe headaches, stiff neck, difficulty swallowing, or swelling of the face or around the ear. Document Released: 08/24/2003 Document Revised: 03/31/2011  Document Reviewed: 12/29/2007 [facility] Patient Information 2015 [place], [place]. This information is not intended to replace advice given to you by your health care provider. Make sure you discuss any questions you have with your health care provider.  Please try Zyrtec-D or Claritin-D at home with tylenol for your pain.

## 2013-11-27 NOTE — ED Notes (Signed)
Pt comfortable with discharge and follow up instructions. Pt declines wheelchair, escorted to waiting area by this RN. No prescriptions. 

## 2014-04-11 ENCOUNTER — Ambulatory Visit: Payer: Self-pay | Admitting: Internal Medicine

## 2014-06-05 ENCOUNTER — Encounter: Payer: Self-pay | Admitting: *Deleted

## 2014-06-06 ENCOUNTER — Ambulatory Visit (INDEPENDENT_AMBULATORY_CARE_PROVIDER_SITE_OTHER): Payer: Medicare Other | Admitting: Neurology

## 2014-06-06 ENCOUNTER — Encounter: Payer: Self-pay | Admitting: Neurology

## 2014-06-06 VITALS — BP 180/90 | HR 79 | Ht 63.5 in | Wt 183.1 lb

## 2014-06-06 DIAGNOSIS — E119 Type 2 diabetes mellitus without complications: Secondary | ICD-10-CM

## 2014-06-06 DIAGNOSIS — IMO0001 Reserved for inherently not codable concepts without codable children: Secondary | ICD-10-CM

## 2014-06-06 DIAGNOSIS — R269 Unspecified abnormalities of gait and mobility: Secondary | ICD-10-CM

## 2014-06-06 DIAGNOSIS — M4807 Spinal stenosis, lumbosacral region: Secondary | ICD-10-CM

## 2014-06-06 DIAGNOSIS — Z794 Long term (current) use of insulin: Secondary | ICD-10-CM

## 2014-06-06 DIAGNOSIS — R278 Other lack of coordination: Secondary | ICD-10-CM

## 2014-06-06 DIAGNOSIS — R29898 Other symptoms and signs involving the musculoskeletal system: Secondary | ICD-10-CM

## 2014-06-06 LAB — TSH: TSH: 2.337 u[IU]/mL (ref 0.350–4.500)

## 2014-06-06 LAB — VITAMIN B12: VITAMIN B 12: 233 pg/mL (ref 211–911)

## 2014-06-06 LAB — CK: CK TOTAL: 91 U/L (ref 7–177)

## 2014-06-06 NOTE — Progress Notes (Signed)
Note sent

## 2014-06-06 NOTE — Patient Instructions (Addendum)
1.  Check blood work 2.  EMG of the legs 3.  Use a cane or walker for support 4.  Return to clinic in 722-months

## 2014-06-06 NOTE — Progress Notes (Signed)
[facility] Clinic Note - Initial Visit   Date: 06/06/2014   Yolanda Saunders MRN: [id number] DOB: 12/22/1945   Dear Dr. Chrissie Noa:  Thank you for your kind referral of Yolanda Saunders for consultation of bilateral leg weakness. Although her history is well known to you, please allow [place] to reiterate it for the purpose of our medical record. The patient was accompanied to the clinic by self.    History of Present Illness: Yolanda Saunders is a 69 y.o. left-handed Caucasian female with GERD, type II diabetes mellitus on insulin (HbA1c 10.1), hyperlipidemia, depression/anxiety, stroke following cardiac catherization (02/2000) with residual word-finding deficits, CKD stage 3, bilateral CTS release, and fibromylagia presenting for evaluation of bilateral leg weakness.    Starting in early 2015, she developed spells of tremors of the legs and imbalance. She feels that her knees cannot hold her up and cause her legs to give out.  This occurs about once every two weeks, lasting a few days.  She had one fall while walking in the park, but was able to get up by herself.  She sustained superficial injuries to the knees.  Her legs symptoms are sporadic and occur in the afternoon between 4-6pm.  Symptoms are improved with rest.  She does not use a cane or walker, but has them at home.  These spells are not associated with any pain.  She endorses low back pain which is achy.     She has a long history of fibromyalgia but has not noticed any worsening of muscle pain.   Denies any double vision, difficulty swallowing/talking, cramps, or hand weakness.    Out-side paper records, electronic medical record, and images have been reviewed where available and summarized as:  MRI brain wo contrast 07/18/2013:  Mild chronic microvascular ischemic change. No acute abnormality  MRI lumbar spine 01/26/2006: 1. Central protrusion at L4-5 with posterior element hypertrophy; mild bilateral  L5 nerve root encroachment is observed right greater than left.  2. Shallow right paracentral protrusion L1-2 without neural encroachment.  3. Lower lumbar facet arthropathy worst at L3-4 and L4-5.  Labs 05/01/2014:  HbA1c 10.1   Past Medical History  Diagnosis Date  . GERD (gastroesophageal reflux disease)   . Asthma   . Carpal tunnel syndrome   . Complication of anesthesia 1987    "affected my eyes; couldn't see anything but blurrs even the next day"  . Hypertension   . High cholesterol   . Angina 04/16/11    "that's what I'm here for"  . Pneumonia 04/17/11    "probably as many as 7 times"  . History of bronchitis   . Shortness of breath on exertion     "cause of my asthma"  . Type II diabetes mellitus   . Anemia 04/17/11    "long, long years ago"  . Headache(784.0)   . Migraines   . Anxiety   . Panic attacks 04/17/11    "don't take anything for it"  . Stroke 2002    residual "problem w/using the right word, left 5 lesions on my brain/MRI; long term memory loss"  . IBS (irritable bowel syndrome)   . Edema   . Depression   . CVA (cerebral infarction)     After cardiac catheter 02/2000  . Cataracts, bilateral   . Fibromyalgia   . Renal disorder 04/17/11    "they are working at 60% capacity"  . CKD (chronic kidney disease), stage III     Past  Surgical History  Procedure Laterality Date  . Vaginal hysterectomy  1977  . Cholecystectomy  1987  . Carpal tunnel release  2003-2010    "twice on left; once on right"  . Debridement tennis elbow  2010  . Cardiac catheterization  2002     Medications:  Current Outpatient Prescriptions on File Prior to Visit  Medication Sig Dispense Refill  . albuterol (PROVENTIL HFA;VENTOLIN HFA) 108 (90 BASE) MCG/ACT inhaler Inhale 2 puffs into the lungs every 6 (six) hours as needed. For shortness of breath    . aspirin EC 81 MG tablet Take 81 mg by mouth daily.    . furosemide (LASIX) 20 MG tablet Take 20 mg by mouth 2 (two) times  daily.     . insulin detemir (LEVEMIR) 100 UNIT/ML injection Inject 64 Units into the skin 2 (two) times daily.     . ranitidine (ZANTAC) 300 MG tablet Take 300 mg by mouth 2 (two) times daily.    . rosuvastatin (CRESTOR) 20 MG tablet Take 20 mg by mouth daily.    . Vitamin D, Cholecalciferol, 1000 UNITS TABS Take by mouth.     No current facility-administered medications on file prior to visit.    Allergies:  Allergies  Allergen Reactions  . Codeine Other (See Comments)    "makes me crazy; see things; delusional"  . Erythromycin Other (See Comments)    "peeled skin; like a sunburn & I turn the color of the pill; a kind of purple-look"  . Lisinopril Cough    "cause I have asthma"  . Penicillins Rash and Other (See Comments)    "puffy blisters  . Sulfonamide Derivatives Hives    "watery hives"  . Actos [Pioglitazone]     Upset GI  . Amlodipine     Syncope  . Benicar [Olmesartan] Other (See Comments)    Dizziness and HA  . Byetta 10 Mcg Pen [Exenatide] Nausea And Vomiting    5 MCG pen  . Glimepiride     Upset GI  . Glipizide     Upset GI  . Glyburide-Metformin     Myalgias  . Humalog [Insulin Lispro]     Headache  . Invokana [Canagliflozin]     Yeast infections  . Januvia [Sitagliptin]     uti  . Reglan [Metoclopramide]     unknown  . Septra [Sulfamethoxazole-Trimethoprim] Hives  . Spironolactone     unk  . Victoza [Liraglutide] Diarrhea    Abdominal pain   . Zocor [Simvastatin]     unknown  . Losartan Potassium Rash    Upset GI  . Novolin R [Insulin] Swelling and Rash    70/30    Family History: Family History  Problem Relation Age of Onset  . Coronary artery disease Father   . Diabetes Mellitus I Father   . CVA Mother   . Stroke Mother   . Diabetes Mellitus I Mother   . Diabetes Mellitus I Sister   . Diabetes Mellitus I Brother   . Diabetes Mellitus I Maternal Grandmother   . Diabetes Mellitus I Maternal Grandfather   . Diabetes Mellitus I  Paternal Grandmother   . Diabetes Mellitus I Paternal Grandfather   . Diabetes Mellitus I Brother   . Aortic aneurysm Son     Social History: History   Social History  . Marital Status: Married    Spouse Name: N/A  . Number of Children: N/A  . Years of Education: N/A   Occupational History  .  Not on file.   Social History Main Topics  . Smoking status: Former Smoker -- 0.75 packs/day for 6 years    Types: Cigarettes    Quit date: 01/21/1980  . Smokeless tobacco: Never Used  . Alcohol Use: No  . Drug Use: No  . Sexual Activity: Not Currently   Other Topics Concern  . Not on file   Social History Narrative   Lives with husband in a one story home.  Has 2 children.  Retired from [employer].  Education: 12th grade.  Trade schools.     Review of Systems:  CONSTITUTIONAL: No fevers, chills, night sweats, or weight loss.   EYES: No visual changes or eye pain ENT: No hearing changes.  No history of nose bleeds.   RESPIRATORY: No cough, wheezing and shortness of breath.   CARDIOVASCULAR: Negative for chest pain, and palpitations.   GI: Negative for abdominal discomfort, blood in stools or black stools.  No recent change in bowel habits.   GU:  No history of incontinence.   MUSCLOSKELETAL: +history of joint pain or swelling.  No myalgias.   SKIN: Negative for lesions, rash, and itching.   HEMATOLOGY/ONCOLOGY: Negative for prolonged bleeding, bruising easily, and swollen nodes.  No history of cancer.   ENDOCRINE: Negative for cold or heat intolerance, polydipsia or goiter.   PSYCH:  No depression or anxiety symptoms.   NEURO: As Above.   Vital Signs:  BP 180/90 mmHg  Pulse 79  Ht 5' 3.5" (1.613 m)  Wt 183 lb 1 oz (83.037 kg)  BMI 31.92 kg/m2  SpO2 97%   General Medical Exam:   General:  Well appearing, comfortable.   Eyes/ENT: see cranial nerve examination.   Neck: No masses appreciated.  Full range of motion without tenderness.  No carotid bruits. Respiratory:   Clear to auscultation, good air entry bilaterally.   Cardiac:  Regular rate and rhythm, no murmur.   Extremities:  No deformities, edema, or skin discoloration.  Skin:  No rashes or lesions.  Neurological Exam: MENTAL STATUS including orientation to time, place, person, recent and remote memory, attention span and concentration, language, and fund of knowledge is normal.  Speech is not dysarthric.  CRANIAL NERVES: II:  No visual field defects.  Unremarkable fundi.   III-IV-VI: Pupils equal round and reactive to light.  Normal conjugate, extra-ocular eye movements in all directions of gaze.  No nystagmus. p Subtle right tosis.   V:  Normal facial sensation.     VII:  Normal facial symmetry and movements.  No pathologic facial reflexes.  VIII:  Normal hearing and vestibular function.   IX-X:  Normal palatal movement.   XI:  Normal shoulder shrug and head rotation.   XII:  Normal tongue strength and range of motion, no deviation or fasciculation.  MOTOR:  No atrophy, fasciculations or abnormal movements.  No pronator drift.  Tone is normal.    Right Upper Extremity:    Left Upper Extremity:    Deltoid  5/5   Deltoid  5/5   Biceps  5/5   Biceps  5/5   Triceps  5/5   Triceps  5/5   Wrist extensors  5/5   Wrist extensors  5/5   Wrist flexors  5/5   Wrist flexors  5/5   Finger extensors  5/5   Finger extensors  5/5   Finger flexors  5/5   Finger flexors  5/5   Dorsal interossei  5/5   Dorsal interossei  5/5   Abductor pollicis  5/5   Abductor pollicis  5/5   Tone (Ashworth scale)  0  Tone (Ashworth scale)  0   Right Lower Extremity:    Left Lower Extremity:    Hip flexors  5/5   Hip flexors  5/5   Hip extensors  5/5   Hip extensors  5/5   Knee flexors  5/5   Knee flexors  5/5   Knee extensors  5/5   Knee extensors  5/5   Dorsiflexors  5/5   Dorsiflexors  5/5   Plantarflexors  5/5   Plantarflexors  5/5   Toe extensors  5/5   Toe extensors  5/5   Toe flexors  5/5   Toe flexors  5/5     Tone (Ashworth scale)  0  Tone (Ashworth scale)  0   MSRs:  Right                                                                 Left brachioradialis 2+  brachioradialis 2+  biceps 2+  biceps 2+  triceps 2+  triceps 2+  patellar 1+  patellar 1+  ankle jerk 0  ankle jerk 0  Hoffman no  Hoffman no  plantar response down  plantar response down   SENSORY:  Normal and symmetric perception of light touch, pinprick, vibration, and proprioception.  Mild sway with Romberg's testing.   COORDINATION/GAIT: Normal finger-to- nose-finger.  Intact rapid alternating movements bilaterally.  Able to rise from a chair without using arms.  Gait appears somewhat unsteady and nonphysiologic at times.  She is able to perform tandem gait and stressed gait without difficulty.   IMPRESSION: Mrs. Mayford KnifeWilliams is a 69 year-old female with insulin dependent diabetes mellitus (HbA1c 10) who presents for evaluation of episodic leg weakness and gait difficulty.  She has a long history of low back pain and her MRI lumbar spine from 2008 demonstrates disc protrusion at L4-5 and mild bilateral L5 nerve root encroachment as well as multilevel facet arthropathy.  It is very likely that there is worsening degenerative changes of the lumbar spine causing her leg weakness.  For completeness, I will screen for other treatable causes of weakness including myasthenia and vitamin deficiency.  Low suspicion for myopathy given intact strength and muscle bulk.  EMG of the legs will be performed to better characterize the nature of her symptoms.  Surprisingly, despite her history of uncontrolled diabetes, her sensation is grossly preserved in her legs, except for mild sensory ataxia so it is difficult to attribute gait unsteadiness to neuropathy, but EMG will be telling.    PLAN/RECOMMENDATIONS:  1.  Check myasthenia gravis antibodies, CK, aldolase, TSH, vitamin B12 2.  EMG of bilateral legs 3.  Consider physical therapy and/or MRI lumbar  spine going forward 4.  Return to clinic in 2 months.   The duration of this appointment visit was 40 minutes of face-to-face time with the patient.  Greater than 50% of this time was spent in counseling, explanation of diagnosis, planning of further management, and coordination of care.   Thank you for allowing me to participate in patient's care.  If I can answer any additional questions, I would be pleased to do so.    Sincerely,  Donika K. Posey Pronto, DO

## 2014-06-08 LAB — ALDOLASE: ALDOLASE: 4.8 U/L (ref <=8.1)

## 2014-06-16 LAB — MYASTHENIA GRAVIS PANEL 2: ACETYLCHOLINE REC MOD AB: 35 %{inhibition} — AB

## 2014-06-20 ENCOUNTER — Telehealth: Payer: Self-pay | Admitting: Neurology

## 2014-06-20 ENCOUNTER — Telehealth: Payer: Self-pay | Admitting: *Deleted

## 2014-06-20 NOTE — Telephone Encounter (Signed)
FYI

## 2014-06-20 NOTE — Telephone Encounter (Signed)
Pt called and wanted to know about her blood work, she will be home after 4pm today/Dawn

## 2014-06-20 NOTE — Telephone Encounter (Signed)
Labs mildly positive for possible autoimmune cause for weakness (myasthenia), let's schedule her for EMG of the arm and leg (MG protocol) on 6/28 at 11am.  Please also reschedule her f/u to 7/1 at 11am (arrive 10:45am) on my schedule.  Brittney Mucha K. Allena KatzPatel, DO

## 2014-06-20 NOTE — Telephone Encounter (Signed)
Called patient and left message for her to call me back.

## 2014-06-20 NOTE — Telephone Encounter (Signed)
I attempted to contact patient via phone today regarding the results of labs, however there was no answer so a message was left for the patient to return my call.   It also looks like her f/u is incorrectly scheduled for a f/u with Dr. Karel JarvisAquino, which I will ask the front desk to correct.  Niccolas Loeper K. Allena KatzPatel, DO

## 2014-06-20 NOTE — Telephone Encounter (Signed)
See previous note

## 2014-06-20 NOTE — Telephone Encounter (Signed)
Patel patient 

## 2014-06-20 NOTE — Telephone Encounter (Signed)
Patient returned your call in reference to her lab results

## 2014-06-20 NOTE — Telephone Encounter (Signed)
Do you want me to call her back?

## 2014-06-20 NOTE — Telephone Encounter (Signed)
Patient has been given the results and appointment times and dates.

## 2014-07-18 ENCOUNTER — Ambulatory Visit (INDEPENDENT_AMBULATORY_CARE_PROVIDER_SITE_OTHER): Payer: Medicare Other | Admitting: Neurology

## 2014-07-18 DIAGNOSIS — Z794 Long term (current) use of insulin: Secondary | ICD-10-CM | POA: Diagnosis not present

## 2014-07-18 DIAGNOSIS — M5417 Radiculopathy, lumbosacral region: Secondary | ICD-10-CM

## 2014-07-18 DIAGNOSIS — E119 Type 2 diabetes mellitus without complications: Secondary | ICD-10-CM

## 2014-07-18 DIAGNOSIS — M4807 Spinal stenosis, lumbosacral region: Secondary | ICD-10-CM

## 2014-07-18 DIAGNOSIS — R29898 Other symptoms and signs involving the musculoskeletal system: Secondary | ICD-10-CM

## 2014-07-18 DIAGNOSIS — R269 Unspecified abnormalities of gait and mobility: Secondary | ICD-10-CM

## 2014-07-18 DIAGNOSIS — R278 Other lack of coordination: Secondary | ICD-10-CM

## 2014-07-18 DIAGNOSIS — IMO0001 Reserved for inherently not codable concepts without codable children: Secondary | ICD-10-CM

## 2014-07-18 NOTE — Procedures (Signed)
[facility]  [address], [address]  [place], [place] [address] Tel: [phone] Fax:  [phone] Test Date:  07/18/2014  Patient: Yolanda Saunders DOB: 08/17/1945 Physician: Nita Sickleonika Patel, DO  Sex: Female Height: 5\' 3"  Ref Phys: Nita Sickleonika Patel, DO  ID#: [id number] Temp: 33.1C Technician: Judie PetitM. Dean   Patient Complaints: This is a 69 year old female resulting for evaluation of bilateral radicular pain and leg weakness.   NCV & EMG Findings: Extensive electrodiagnostic testing of the left upper and lower extremities with additional studies of the right lower extremity as well as repetitive nerve stimulation shows: 1. Left sural, superficial peroneal, and median sensory responses are within normal limits. 2. Left median, tibial, and peroneal motor responses are within normal limits. 3. Repetitive nerve stimulation of the median, peroneal, and spinal accessory nerves recording at the abductor pollicis brevis, tibialis anterior, and trapezius, respectively, is within normal limits. 4. Chronic motor axon loss changes are seen affecting the L5 myotomes bilaterally, without active denervation. .  Impression: 1. Chronic L5 radiculopathy affecting bilateral lower extremities, mild in degree electrically. 2. There is no evidence of a generalized sensorimotor polyneuropathy or neuromuscular junction disorder.   ___________________________ Nita Sickleonika Patel, DO    Nerve Conduction Studies Anti Sensory Summary Table   Site NR Peak (ms) Norm Peak (ms) P-T Amp (V) Norm P-T Amp  Left Median Anti Sensory (2nd Digit)  33.1C  Wrist    3.7 <3.8 30.9 >10  Left  Sup Peroneal Anti Sensory (Ant Lat Mall)  33.1C  12 cm    3.1 <4.6 6.4 >3  Left Sural Anti Sensory (Lat Mall)  33.1C  Calf    3.7 <4.6 5.8 >3   Motor Summary Table   Site NR Onset (ms) Norm Onset (ms) O-P Amp (mV) Norm O-P Amp Site1 Site2 Delta-0 (ms) Dist (cm) Vel (m/s) Norm Vel (m/s)  Left Median Motor (Abd Poll Brev)   33.1C  Wrist    3.5 <4.0 10.9 >5 Elbow Wrist 5.0 25.0 50 >50  Elbow    8.5  10.1         Left Peroneal Motor (Ext Dig Brev)  33.1C  Ankle    4.5 <6.0 3.9 >2.5 B Fib Ankle 7.1 31.0 44 >40  B Fib    11.6  3.6  Poplt B Fib 2.1 10.0 48 >40  Poplt    13.7  3.6         Left Tibial Motor (Abd Hall Brev)  33.1C  Ankle    3.6 <6.0 6.7 >4 Knee Ankle 9.0 38.0 42 >40  Knee    12.6  5.3          EMG   Side Muscle Ins Act Fibs Psw Fasc Number Recrt Dur Dur. Amp Amp. Poly Poly. Comment  Left AntTibialis Nml Nml Nml Nml 1- Rapid Some 1+ Some 1+ Nml Nml N/A  Left Gastroc Nml Nml Nml Nml Nml Nml Nml Nml Nml Nml Nml Nml N/A  Left Flex Dig Long Nml Nml Nml Nml 1- Mod-R Some 1+ Nml Nml Nml Nml N/A  Left RectFemoris Nml Nml Nml Nml Nml Nml Nml Nml Nml Nml Nml Nml N/A  Left GluteusMed Nml Nml Nml Nml 1- Mod-R Some 1+ Nml Nml Nml Nml N/A  Right AntTibialis Nml Nml Nml Nml 1- Rapid Some 1+ Some 1+ Nml Nml N/A  Right Gastroc Nml Nml Nml Nml Nml Nml Nml Nml Nml Nml Nml Nml N/A  Right GluteusMed Nml Nml Nml Nml  1- Mod-R Some 1+ Nml Nml Nml Nml N/A   RNS   Trial # Label Amp 1 (mV)  O-P Amp 5 (mV)  O-P Amp % Dif Area 1 (mVms) Area 5 (mVms) Area % Dif Rep Rate Train Length Pause Time (min:sec) Comments  Left Abd Poll Brev - Run #2  Tr 1 Baseline 10.43 10.41 -0.1 33.19 31.15 -6.2 3.00 10 00:30   Tr 2 Post Exercise 10.97 11.65 6.3 30.74 28.46 -7.4 3.00 10 01:00   Tr 3 1 Min Post 10.90 11.31 3.8 32.54 30.88 -5.1 3.00 10 01:00   Tr 4 2 Min Post 11.13 11.22 0.8 32.74 30.09 -8.1 3.00 10 01:00   Tr 5 3 Min Post 11.53 11.43 -0.9 30.83 29.44 -4.5 3.00 10 00:00   Left AntTibialis  Tr 1 Baseline 3.58 3.55 -0.8 14.84 14.56 -1.9 3.00 10 00:30   Tr 2 Post Exercise 3.67 3.53 -3.9 15.09 15.17 0.5 3.00 10 01:00   Tr 3 1 Min Post 3.61 3.53 -2.2 15.62 14.94 -4.3 3.00 10 01:00   Tr 4 2 Min Post 3.63 3.52 -2.9 16.16 15.02 -7.1 3.00 10 01:00   Tr 5 3 Min Post 3.65 3.58 -1.9 16.33 15.29 -6.4 3.00 10 00:00   Left Trapezius -  Run #2  Tr 1 Baseline 4.55 4.46 -1.9 25.27 23.60 -6.6 3.00 10 00:30   Tr 2 Post Exercise 4.20 4.24 1.1 22.38 21.79 -2.6 3.00 10 01:00   Tr 3 1 Min Post 4.16 4.11 -1.3 21.56 20.41 -5.3 3.00 10 01:00   Tr 4 2 Min Post 4.20 4.20 -0.0 21.92 21.03 -4.1 3.00 10 01:00   Tr 5 3 Min Post 4.16 4.09 -1.7 21.73 20.38 -6.2 3.00 10 00:00       Waveforms:

## 2014-07-21 ENCOUNTER — Ambulatory Visit (INDEPENDENT_AMBULATORY_CARE_PROVIDER_SITE_OTHER): Payer: Medicare Other | Admitting: Neurology

## 2014-07-21 ENCOUNTER — Ambulatory Visit: Payer: PRIVATE HEALTH INSURANCE | Admitting: Neurology

## 2014-07-21 ENCOUNTER — Other Ambulatory Visit: Payer: Self-pay | Admitting: *Deleted

## 2014-07-21 ENCOUNTER — Encounter: Payer: Self-pay | Admitting: Neurology

## 2014-07-21 VITALS — BP 130/84 | HR 72 | Ht 63.5 in | Wt 185.4 lb

## 2014-07-21 DIAGNOSIS — E538 Deficiency of other specified B group vitamins: Secondary | ICD-10-CM

## 2014-07-21 DIAGNOSIS — IMO0001 Reserved for inherently not codable concepts without codable children: Secondary | ICD-10-CM

## 2014-07-21 DIAGNOSIS — M5417 Radiculopathy, lumbosacral region: Secondary | ICD-10-CM

## 2014-07-21 DIAGNOSIS — E119 Type 2 diabetes mellitus without complications: Secondary | ICD-10-CM | POA: Diagnosis not present

## 2014-07-21 DIAGNOSIS — Z794 Long term (current) use of insulin: Secondary | ICD-10-CM

## 2014-07-21 MED ORDER — CYANOCOBALAMIN 1000 MCG/ML IJ SOLN
1000.0000 ug | Freq: Once | INTRAMUSCULAR | Status: AC
  Administered 2014-07-21: 1000 ug via INTRAMUSCULAR

## 2014-07-21 MED ORDER — CYANOCOBALAMIN 1000 MCG/ML IJ SOLN: 1000.0000 ug | Freq: Once | INTRAMUSCULAR | Status: DC

## 2014-07-21 MED ORDER — "SYRINGE LUER LOCK 23G X 1"" 3 ML MISC": 1.0000 | Status: DC | PRN

## 2014-07-21 NOTE — Progress Notes (Signed)
Follow-up Visit   Date: 07/21/2014    Yolanda Saunders MRN: [id number] DOB: [birth year]   Interim History: Yolanda Saunders is a 69 y.o. left-handed Caucasian female with GERD, type II diabetes mellitus on insulin (HbA1c 10.1), hyperlipidemia, depression/anxiety, stroke following cardiac catherization (02/2000) with residual word-finding deficits, CKD stage 3, bilateral CTS release, and fibromylagia returning for follow-up of bilateral leg weakness.  History of present illness: Starting in early 2015, she developed spells of tremors of the legs and imbalance. She feels that her knees cannot hold her up and cause her legs to give out.  This occurs about once every two weeks, lasting a few days.  She had one fall while walking in the park, but was able to get up by herself.  She sustained superficial injuries to the knees.  Her legs symptoms are sporadic and occur in the afternoon between 4-6pm.  Symptoms are improved with rest.  She does not use a cane or walker, but has them at home.  These spells are not associated with any pain.  She endorses low back pain which is achy.     She has a long history of fibromyalgia but has not noticed any worsening of muscle pain.    UPDATE 07/21/2014:  Since having her EMG, she interestingly says that her legs have been feeling well.  She had three spells of leg weakness lasting about a minute, but this is much improved than previously.  No new complaints.  Denies any double vision, difficulty swallowing/talking, cramps, or hand weakness.   She is here to discuss results of her EMG which did not show and NMJ disorder, only chronic L5 radiculopathy bilaterally.  Her AChR modulating antibodies were mildly positive and her vitamin B12 is low-normal.    Medications:  Current Outpatient Prescriptions on File Prior to Visit  Medication Sig Dispense Refill  . albuterol (PROVENTIL HFA;VENTOLIN HFA) 108 (90 BASE) MCG/ACT inhaler Inhale 2 puffs into the lungs  every 6 (six) hours as needed. For shortness of breath    . aspirin EC 81 MG tablet Take 81 mg by mouth daily.    . furosemide (LASIX) 20 MG tablet Take 20 mg by mouth 2 (two) times daily.     . insulin detemir (LEVEMIR) 100 UNIT/ML injection Inject 64 Units into the skin 2 (two) times daily.     . ranitidine (ZANTAC) 300 MG tablet Take 300 mg by mouth 2 (two) times daily.    . rosuvastatin (CRESTOR) 20 MG tablet Take 20 mg by mouth daily.    . Vitamin D, Cholecalciferol, 1000 UNITS TABS Take by mouth.     No current facility-administered medications on file prior to visit.    Allergies:  Allergies  Allergen Reactions  . Codeine Other (See Comments)    "makes me crazy; see things; delusional"  . Erythromycin Other (See Comments)    "peeled skin; like a sunburn & I turn the color of the pill; a kind of purple-look"  . Lisinopril Cough    "cause I have asthma"  . Penicillins Rash and Other (See Comments)    "puffy blisters  . Sulfonamide Derivatives Hives    "watery hives"  . Actos [Pioglitazone]     Upset GI  . Amlodipine     Syncope  . Benicar [Olmesartan] Other (See Comments)    Dizziness and HA  . Byetta 10 Mcg Pen [Exenatide] Nausea And Vomiting    5 MCG pen  . Glimepiride  Upset GI  . Glipizide     Upset GI  . Glyburide-Metformin     Myalgias  . Humalog [Insulin Lispro]     Headache  . Invokana [Canagliflozin]     Yeast infections  . Januvia [Sitagliptin]     uti  . Reglan [Metoclopramide]     unknown  . Septra [Sulfamethoxazole-Trimethoprim] Hives  . Spironolactone     unk  . Victoza [Liraglutide] Diarrhea    Abdominal pain   . Zocor [Simvastatin]     unknown  . Losartan Potassium Rash    Upset GI  . Novolin R [Insulin] Swelling and Rash    70/30    Review of Systems:  CONSTITUTIONAL: No fevers, chills, night sweats, or weight loss.  EYES: No visual changes or eye pain ENT: No hearing changes.  No history of nose bleeds.   RESPIRATORY: No cough,  wheezing and shortness of breath.   CARDIOVASCULAR: Negative for chest pain, and palpitations.   GI: Negative for abdominal discomfort, blood in stools or black stools.  No recent change in bowel habits.   GU:  No history of incontinence.   MUSCLOSKELETAL: No history of joint pain or swelling.  No myalgias.   SKIN: Negative for lesions, rash, and itching.   ENDOCRINE: Negative for cold or heat intolerance, polydipsia or goiter.   PSYCH:  No depression or anxiety symptoms.   NEURO: As Above.   Vital Signs:  BP 130/84 mmHg  Pulse 72  Ht 5' 3.5" (1.613 m)  Wt 185 lb 7 oz (84.114 kg)  BMI 32.33 kg/m2  SpO2 98%  Neurological Exam: MENTAL STATUS including orientation to time, place, person, recent and remote memory, attention span and concentration, language, and fund of knowledge is normal.  Speech is not dysarthric.  CRANIAL NERVES: No visual field defects. Pupils equal round and reactive to light.  Normal conjugate, extra-ocular eye movements in all directions of gaze.  No ptosis. Normal facial sensation.  Face is symmetric. Palate elevates symmetrically.  Tongue is midline.  MOTOR:  Motor strength is 5/5 in all extremities.  No atrophy, fasciculations or abnormal movements.  No pronator drift.  Tone is normal.    MSRs:  Reflexes are 2+/4 throughout, except right patella is 1+/4.  SENSORY:  Intact to vibration.  COORDINATION/GAIT:  Normal finger-to- nose-finger and heel-to-shin.  Intact rapid alternating movements bilaterally.  Gait narrow based and stable.   Data: Labs 06/06/2014:  Acetylcholine modulating antibody positive 35* (<32 is normal), binding and blocking is negative CK 91, aldolase 4.8, TSH 2.337, vitamin B12 233  MRI brain wo contrast 07/18/2013:  Mild chronic microvascular ischemic change.  No acute abnormality  MRI lumbar spine 01/26/2006: 1.  Central protrusion at L4-5 with posterior element hypertrophy; mild bilateral L5 nerve root encroachment is observed right  greater than left.   2.  Shallow right paracentral protrusion L1-2 without neural encroachment.   3.  Lower lumbar facet arthropathy worst at L3-4 and L4-5.  Labs 05/01/2014:  HbA1c 10.1  EMG 07/18/2014: Impression: 1. Chronic L5 radiculopathy affecting bilateral lower extremities, mild in degree electrically. 2. There is no evidence of a generalized sensorimotor polyneuropathy or neuromuscular junction disorder.    IMPRESSION/PLAN: Mrs. Wescott is a 69 year-old female with insulin dependent diabetes mellitus (HbA1c 10) who returning for follow-up of episodic leg weakness and gait difficulty.  To further evaluate her symptoms, I ordered screening testing for myasthenia which returned mildly positive for AChR modulating antibody, however there was no evidence of  decrement on her EMG. No evidence of neuropathy either, which is great given her history of uncontrolled diabetes. Although my clinical suspicion for myasthenia is low, modulating antibodies are highly sensitive and specific, so we discussed a trial of mestinon to see if it would help, but would like to hold off on this for now.  Alternatively, she has a long history of low back pain and her MRI lumbar spine from 2008 demonstrates disc protrusion at L4-5 and mild bilateral L5 nerve root encroachment as well as multilevel facet arthropathy.  It is very likely that there is worsening degenerative changes of the lumbar spine causing her leg weakness.  We discussed starting physical therapy, if there is no improvement imaging of the lumbar spine is the next step.  Additionally, her labs also indicated low-normal vitamin B12 of 233.  I would like for her to start supplementation as this may very well be causing her gait unsteadiness.   PLAN/RECOMMENDATIONS:  1.  Start Vitamin B12 1000mcg IM injection daily x 7 days, weekly x 4 weeks, then monthly thereafter x 1 year.  First injection today. 2.  If no improvement, start mestinon 60mg  three  times daily 3.  If leg pain develops, low threshold to start PT and/or MRI lumbar spine   The duration of this appointment visit was 35 minutes of face-to-face time with the patient.  Greater than 50% of this time was spent in counseling, explanation of diagnosis, planning of further management, and coordination of care.   Thank you for allowing me to participate in patient's care.  If I can answer any additional questions, I would be pleased to do so.    Sincerely,    Tahj Njoku K. Allena KatzPatel, DO

## 2014-07-21 NOTE — Patient Instructions (Signed)
1.  Start vitamin B12 daily for 7 days, then once per week for 4 weeks, then monthly 2.  If your symptoms worsen, we can consider either physical therapy or mestinon for possible myasthenia gravis 3.  Return to clinic in 2-3 months or sooner as needed

## 2014-09-28 ENCOUNTER — Ambulatory Visit (INDEPENDENT_AMBULATORY_CARE_PROVIDER_SITE_OTHER): Payer: Medicare Other | Admitting: Internal Medicine

## 2014-09-28 ENCOUNTER — Encounter: Payer: Self-pay | Admitting: Internal Medicine

## 2014-09-28 DIAGNOSIS — N189 Chronic kidney disease, unspecified: Secondary | ICD-10-CM | POA: Diagnosis not present

## 2014-09-28 DIAGNOSIS — E1122 Type 2 diabetes mellitus with diabetic chronic kidney disease: Secondary | ICD-10-CM

## 2014-09-28 MED ORDER — CANAGLIFLOZIN 100 MG PO TABS: 100.0000 mg | ORAL_TABLET | Freq: Every day | ORAL | Status: DC

## 2014-09-28 MED ORDER — INSULIN GLARGINE 300 UNIT/ML ~~LOC~~ SOPN: 80.0000 [IU] | PEN_INJECTOR | Freq: Every day | SUBCUTANEOUS | Status: DC

## 2014-09-28 MED ORDER — METFORMIN HCL ER 500 MG PO TB24: 500.0000 mg | ORAL_TABLET | Freq: Two times a day (BID) | ORAL | Status: DC

## 2014-09-28 MED ORDER — INSULIN PEN NEEDLE 32G X 4 MM MISC: Status: DC

## 2014-09-28 NOTE — Progress Notes (Signed)
Patient ID: Yolanda Saunders, female   DOB: 1945-06-21, 69 y.o.   MRN: [id number]  HPI: Yolanda Saunders is a 69 y.o.-year-old female, referred by her PCP, Dr. Tiburcio Pea for management of DM2, dx 1999, insulin-dependent since ~5 years ago, uncontrolled, with complications (CKD stage 3, h/o stroke post cardiac cath in 2002, macroalbuminuria). She saw Dr Sharl Ma in the past.  Last hemoglobin A1c was: 07/31/2014: 13.4%  Lab Results  Component Value Date   HGBA1C 7.7* 04/16/2011   Pt is on a regimen of: - Levemir 110 units 2x a day >> HA, swelling, weight gain She was started Bydureon 2 mg weekly - in donut hole >> did not start Tried Victoza >> helped, but AP Tried Byetta >> N/V Tried Metformin >> AP, diarrhea Tried SU >> upset stomach Tried Invokana >> yeast inf  Pt checks her sugars 1x a day and they are: - am: 50s, 200s-300s - 2h after b'fast: n/c - before lunch: n/c - 2h after lunch: n/c - before dinner: n/c - 2h after dinner: n/c - bedtime: n/c - nighttime: n/c + lows. Lowest sugar was 57 (in am).  she has hypoglycemia awareness at 60s.  Highest sugar was 590.  Glucometer: Free Style  Pt's meals are: - Breakfast: oatmeal - Lunch: sandwich; chicken nuggets and fries - Dinner: heaviest: meat + 2 veggies + dessert later - Snacks: "too many" - cookies, sugary snacks  - + CKD, last BUN/creatinine:  Lab Results  Component Value Date   BUN 19 10/18/2011   CREATININE 1.09 10/18/2011  Had albuminuria in 04/2014 >300 Intolerant to ACEI and ARBs. On Lasix 20 bid. - last set of lipids: Lab Results  Component Value Date   CHOL 106 04/17/2011   HDL 46 04/17/2011   LDLCALC 37 04/17/2011   TRIG 116 04/17/2011   CHOLHDL 2.3 04/17/2011  On Lipitor. - last eye exam was in 07/2013. No DR. Has cataracts. - no numbness and tingling in her feet.  Pt has FH of DM in many family mbs (see FH below).  She is seeing Dr Nita Sickle >> investigation for disequllibrium and word  difficulty. 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  ROS: Constitutional: + weight gain, + fatigue, + feeling cold, + poor sleep, + nocturia Eyes: + blurry vision, no xerophthalmia ENT: + sore throat, no nodules palpated in throat, no dysphagia/odynophagia, no hoarseness,  + hypoacusis Cardiovascular: no CP/+ SOB/+ palpitations/+ leg swelling Respiratory: no cough/+ SOB Gastrointestinal: + N/no V/D/+ C, + heartburn Musculoskeletal: + muscle aches/+ joint aches Skin: + rash, + itching, + easy bruising, + hair loss Neurological: no tremors/numbness/tingling/dizziness, + HA Psychiatric:+ depression/no anxiety  Past Medical History  Diagnosis Date  . GERD (gastroesophageal reflux disease)   . Asthma   . Carpal tunnel syndrome   . Complication of anesthesia 1987    "affected my eyes; couldn't see anything but blurrs even the next day"  . Hypertension   . High cholesterol   . Angina 04/16/11    "that's what I'm here for"  . Pneumonia 04/17/11    "probably as many as 7 times"  . History of bronchitis   . Shortness of breath on exertion     "cause of my asthma"  . Type II diabetes mellitus   . Anemia 04/17/11    "long, long years ago"  . Headache(784.0)   . Migraines   . Anxiety   . Panic attacks 04/17/11    "  don't take anything for it"  . Stroke 2002    residual "problem w/using the right word, left 5 lesions on my brain/MRI; long term memory loss"  . IBS (irritable bowel syndrome)   . Edema   . Depression   . CVA (cerebral infarction)     After cardiac catheter 02/2000  . Cataracts, bilateral   . Fibromyalgia   . Renal disorder 04/17/11    "they are working at 60% capacity"  . CKD (chronic kidney disease), stage III    Past Surgical History  Procedure Laterality Date  . Vaginal hysterectomy  1977  . Cholecystectomy  1987  . Carpal tunnel release  2003-2010    "twice on left; once on right"  . Debridement tennis elbow  2010  . Cardiac  catheterization  2002   Social History   Social History  . Marital Status: Married   Social History Main Topics  . Smoking status: Former Smoker -- 0.75 packs/day for 6 years    Types: Cigarettes    Quit date: 01/21/1980  . Smokeless tobacco: Never Used  . Alcohol Use: No  . Drug Use: No   Social History Narrative   Lives with husband in a one story home.  Has 2 children.  Retired from [employer].  Education: 12th grade.  Trade schools.    Current Outpatient Prescriptions on File Prior to Visit  Medication Sig Dispense Refill  . albuterol (PROVENTIL HFA;VENTOLIN HFA) 108 (90 BASE) MCG/ACT inhaler Inhale 2 puffs into the lungs every 6 (six) hours as needed. For shortness of breath    . aspirin EC 81 MG tablet Take 81 mg by mouth daily.    . cyanocobalamin (,VITAMIN B-12,) 1000 MCG/ML injection Inject 1 mL (1,000 mcg total) into the muscle once. 25 mL 0  . furosemide (LASIX) 20 MG tablet Take 20 mg by mouth 2 (two) times daily.     . insulin detemir (LEVEMIR) 100 UNIT/ML injection Inject 110 Units into the skin 2 (two) times daily.     . ranitidine (ZANTAC) 300 MG tablet Take 300 mg by mouth 2 (two) times daily.    . Syringe/Needle, Disp, (SYRINGE LUER LOCK) 23G X 1" 3 ML MISC 1 Package by Does not apply route as needed. 50 each 0  . Vitamin D, Cholecalciferol, 1000 UNITS TABS Take by mouth.     No current facility-administered medications on file prior to visit.   Allergies  Allergen Reactions  . Codeine Other (See Comments)    "makes me crazy; see things; delusional"  . Erythromycin Other (See Comments)    "peeled skin; like a sunburn & I turn the color of the pill; a kind of purple-look"  . Lisinopril Cough    "cause I have asthma"  . Penicillins Rash and Other (See Comments)    "puffy blisters  . Sulfonamide Derivatives Hives    "watery hives"  . Actos [Pioglitazone]     Upset GI  . Amlodipine     Syncope  . Benicar [Olmesartan] Other (See Comments)    Dizziness  and HA  . Byetta 10 Mcg Pen [Exenatide] Nausea And Vomiting    5 MCG pen  . Glimepiride     Upset GI  . Glipizide     Upset GI  . Glyburide-Metformin     Myalgias  . Humalog [Insulin Lispro]     Headache  . Invokana [Canagliflozin]     Yeast infections  . Januvia [Sitagliptin]     uti  .  Reglan [Metoclopramide]     unknown  . Septra [Sulfamethoxazole-Trimethoprim] Hives  . Spironolactone     unk  . Victoza [Liraglutide] Diarrhea    Abdominal pain   . Zocor [Simvastatin]     unknown  . Losartan Potassium Rash    Upset GI  . Novolin R [Insulin] Swelling and Rash    70/30   Family History  Problem Relation Age of Onset  . Coronary artery disease Father   . Diabetes Mellitus I Father   . CVA Mother   . Stroke Mother   . Diabetes Mellitus I Mother   . Diabetes Mellitus I Sister   . Diabetes Mellitus I Brother   . Diabetes Mellitus I Maternal Grandmother   . Diabetes Mellitus I Maternal Grandfather   . Diabetes Mellitus I Paternal Grandmother   . Diabetes Mellitus I Paternal Grandfather   . Diabetes Mellitus I Brother   . Aortic aneurysm Son    PE: BP 126/78 mmHg  Pulse 80  Temp(Src) 98.4 F (36.9 C) (Oral)  Resp 12  Ht 5\' 3"  (1.6 m)  Wt 188 lb (85.276 kg)  BMI 33.31 kg/m2  SpO2 98% Wt Readings from Last 3 Encounters:  09/28/14 188 lb (85.276 kg)  07/21/14 185 lb 7 oz (84.114 kg)  06/06/14 183 lb 1 oz (83.037 kg)   Constitutional: overweight, in NAD Eyes: PERRLA, EOMI, no exophthalmos ENT: moist mucous membranes, no thyromegaly, no cervical lymphadenopathy Cardiovascular: RRR, No MRG Respiratory: CTA B Gastrointestinal: abdomen soft, NT, ND, BS+ Musculoskeletal: no deformities, strength intact in all 4 Skin: moist, warm, no rashes Neurological: no tremor with outstretched hands, DTR normal in all 4  ASSESSMENT: 1. DM2, non-insulin-dependent, uncontrolled, without complications  PLAN:  1. Patient with long-standing, uncontrolled diabetes, on only  basal insulin, which became insufficient. She has very fluctuating sugars, from 50s-500s. We discussed to decrease the basal insulin (change to Toujeo as this is 3x more concentrated and she can inject less volume; this is also assoc. With less hypoglycemia) and add Metformin XR (half max dose; she agrees to try this) and Invokana.  - we discussed about SEs of Invokana, which are: dizziness (advised to be careful when stands from sitting position), decreased BP - usually not < normal (BP today is not low), and fungal UTIs (advised to let me know if develops one).  - given discount card for Invokana - I suggested to:  Patient Instructions  Please start metformin extended-release 500 mg with dinner for the next 3 days. If you tolerate this well, add another 500 mg tablet with breakfast and continue with 500 mg twice a day. Start Invokana 100 mg in a.m., before breakfast. Stay very well-hydrated while you take the Invokana. Stop Levemir and start Toujeo 80 units twice a day.  Please let me know in a week about how your sugars are doing.  Please return in 1.5 month with your sugar log.  - discussed improving her diet and starting to exercise - Strongly advised her to start checking sugars at different times of the day - check at least 2 times a day, rotating checks - given sugar log and advised how to fill it and to bring it at next appt  - given foot care handout and explained the principles  - given instructions for hypoglycemia management "15-15 rule"  - advised for yearly eye exams >> she needs one - Return to clinic in 1.5 mo with sugar log

## 2014-09-28 NOTE — Patient Instructions (Addendum)
Please start metformin extended-release 500 mg with dinner for the next 3 days. If you tolerate this well, add another 500 mg tablet with breakfast and continue with 500 mg twice a day. Start Invokana 100 mg in a.m., before breakfast. Stay very well-hydrated while you take the Invokana. Stop Levemir and start Toujeo 80 units twice a day.  Please let me know in a week about how your sugars are doing.  Please return in 1.5 month with your sugar log.   PATIENT INSTRUCTIONS FOR TYPE 2 DIABETES:  **Please join MyChart!** - see attached instructions about how to join if you have not done so already.  DIET AND EXERCISE Diet and exercise is an important part of diabetic treatment.  We recommended aerobic exercise in the form of brisk walking (working between 40-60% of maximal aerobic capacity, similar to brisk walking) for 150 minutes per week (such as 30 minutes five days per week) along with 3 times per week performing 'resistance' training (using various gauge rubber tubes with handles) 5-10 exercises involving the major muscle groups (upper body, lower body and core) performing 10-15 repetitions (or near fatigue) each exercise. Start at half the above goal but build slowly to reach the above goals. If limited by weight, joint pain, or disability, we recommend daily walking in a swimming pool with water up to waist to reduce pressure from joints while allow for adequate exercise.    BLOOD GLUCOSES Monitoring your blood glucoses is important for continued management of your diabetes. Please check your blood glucoses 2-4 times a day: fasting, before meals and at bedtime (you can rotate these measurements - e.g. one day check before the 3 meals, the next day check before 2 of the meals and before bedtime, etc.).   HYPOGLYCEMIA (low blood sugar) Hypoglycemia is usually a reaction to not eating, exercising, or taking too much insulin/ other diabetes drugs.  Symptoms include tremors, sweating, hunger,  confusion, headache, etc. Treat IMMEDIATELY with 15 grams of Carbs: . 4 glucose tablets .  cup regular juice/soda . 2 tablespoons raisins . 4 teaspoons sugar . 1 tablespoon honey Recheck blood glucose in 15 mins and repeat above if still symptomatic/blood glucose <100.  RECOMMENDATIONS TO REDUCE YOUR RISK OF DIABETIC COMPLICATIONS: * Take your prescribed MEDICATION(S) * Follow a DIABETIC diet: Complex carbs, fiber rich foods, (monounsaturated and polyunsaturated) fats * AVOID saturated/trans fats, high fat foods, >2,300 mg salt per day. * EXERCISE at least 5 times a week for 30 minutes or preferably daily.  * DO NOT SMOKE OR DRINK more than 1 drink a day. * Check your FEET every day. Do not wear tightfitting shoes. Contact us if you develop an ulcer * See your EYE doctor once a year or more if needed * Get a FLU shot once a year * Get a PNEUMONIA vaccine once before and once after age 40 years  GOALS:  * Your Hemoglobin A1c of <7%  * fasting sugars need to be <130 * after meals sugars need to be <180 (2h after you start eating) * Your Systolic BP should be 140 or lower  * Your Diastolic BP should be 80 or lower  * Your HDL (Good Cholesterol) should be 40 or higher  * Your LDL (Bad Cholesterol) should be 100 or lower. * Your Triglycerides should be 150 or lower  * Your Urine microalbumin (kidney function) should be <30 * Your Body Mass Index should be 25 or lower    Please consider the following ways  to cut down carbs and fat and increase fiber and micronutrients in your diet: - substitute whole grain for white bread or pasta - substitute brown rice for white rice - substitute 90-calorie flat bread pieces for slices of bread when possible - substitute sweet potatoes or yams for white potatoes - substitute humus for margarine - substitute tofu for cheese when possible - substitute almond or rice milk for regular milk (would not drink soy milk daily due to concern for soy  estrogen influence on breast cancer risk) - substitute dark chocolate for other sweets when possible - substitute water - can add lemon or orange slices for taste - for diet sodas (artificial sweeteners will trick your body that you can eat sweets without getting calories and will lead you to overeating and weight gain in the long run) - do not skip breakfast or other meals (this will slow down the metabolism and will result in more weight gain over time)  - can try smoothies made from fruit and almond/rice milk in am instead of regular breakfast - can also try old-fashioned (not instant) oatmeal made with almond/rice milk in am - order the dressing on the side when eating salad at a restaurant (pour less than half of the dressing on the salad) - eat as little meat as possible - can try juicing, but should not forget that juicing will get rid of the fiber, so would alternate with eating raw veg./fruits or drinking smoothies - use as little oil as possible, even when using olive oil - can dress a salad with a mix of balsamic vinegar and lemon juice, for e.g. - use agave nectar, stevia sugar, or regular sugar rather than artificial sweateners - steam or broil/roast veggies  - snack on veggies/fruit/nuts (unsalted, preferably) when possible, rather than processed foods - reduce or eliminate aspartame in diet (it is in diet sodas, chewing gum, etc) Read the labels!  Try to read Dr. Katherina Right book: "Program for Reversing Diabetes" for other ideas for healthy eating.

## 2014-10-02 ENCOUNTER — Encounter: Payer: Self-pay | Admitting: Neurology

## 2014-10-02 ENCOUNTER — Ambulatory Visit (INDEPENDENT_AMBULATORY_CARE_PROVIDER_SITE_OTHER): Payer: Medicare Other | Admitting: Neurology

## 2014-10-02 VITALS — BP 130/70 | HR 73 | Ht 63.0 in | Wt 185.2 lb

## 2014-10-02 DIAGNOSIS — R269 Unspecified abnormalities of gait and mobility: Secondary | ICD-10-CM | POA: Diagnosis not present

## 2014-10-02 DIAGNOSIS — E538 Deficiency of other specified B group vitamins: Secondary | ICD-10-CM

## 2014-10-02 NOTE — Patient Instructions (Signed)
Return to clinic as needed

## 2014-10-02 NOTE — Progress Notes (Signed)
Follow-up Visit   Date: 10/02/2014    Yolanda Saunders MRN: [id number] DOB: [birth year]   Interim History: Yolanda Saunders is a 69 y.o. left-handed Caucasian female with GERD, type II diabetes mellitus on insulin (HbA1c 10.1), hyperlipidemia, depression/anxiety, stroke following cardiac catherization (02/2000) with residual word-finding deficits, CKD stage 3, bilateral CTS release, and fibromylagia returning for follow-up of episodic bilateral leg weakness.  History of present illness: Starting in early 2015, she developed spells of tremors of the legs and imbalance. She feels that her knees cannot hold her up and cause her legs to give out.  This occurs about once every two weeks, lasting a few days.  She had one fall while walking in the park, but was able to get up by herself.  She sustained superficial injuries to the knees.  Her legs symptoms are sporadic and occur in the afternoon between 4-6pm.  Symptoms are improved with rest.  She does not use a cane or walker, but has them at home.  These spells are not associated with any pain.  She endorses low back pain which is achy.     She has a long history of fibromyalgia but has not noticed any worsening of muscle pain.   UPDATE 07/21/2014:  Since having her EMG, she interestingly says that her legs have been feeling well.  She had three spells of leg weakness lasting about a minute, but this is much improved than previously.  She is here to discuss results of her EMG which did not show and NMJ disorder, only chronic L5 radiculopathy bilaterally.  Her AChR modulating antibodies were mildly positive and her vitamin B12 is low-normal.   UPDATE 10/02/2014:  She had noticed a huge improvement in her muscles and energy since starting B12 supplements and also had her diabetes medications adjusted.  No new complaints.  She has no double vision, dysarthria, dysphagia, or limb weakness.  No falls, hospitalizations, or illnesses.    Medications:    Current Outpatient Prescriptions on File Prior to Visit  Medication Sig Dispense Refill  . albuterol (PROVENTIL HFA;VENTOLIN HFA) 108 (90 BASE) MCG/ACT inhaler Inhale 2 puffs into the lungs every 6 (six) hours as needed. For shortness of breath    . aspirin EC 81 MG tablet Take 81 mg by mouth daily.    Marland Kitchen atorvastatin (LIPITOR) 40 MG tablet Take 40 mg by mouth daily.    . canagliflozin (INVOKANA) 100 MG TABS tablet Take 1 tablet (100 mg total) by mouth daily. 30 tablet 2  . cyanocobalamin (,VITAMIN B-12,) 1000 MCG/ML injection Inject 1 mL (1,000 mcg total) into the muscle once. 25 mL 0  . furosemide (LASIX) 20 MG tablet Take 20 mg by mouth 2 (two) times daily.     . Insulin Glargine (TOUJEO SOLOSTAR) 300 UNIT/ML SOPN Inject 80 Units into the skin at bedtime. 12 pen 2  . Insulin Pen Needle (CAREFINE PEN NEEDLES) 32G X 4 MM MISC Use 2x a day 100 each 11  . metFORMIN (GLUCOPHAGE-XR) 500 MG 24 hr tablet Take 1 tablet (500 mg total) by mouth 2 (two) times daily with a meal. 60 tablet 2  . ranitidine (ZANTAC) 300 MG tablet Take 300 mg by mouth 2 (two) times daily.    . Syringe/Needle, Disp, (SYRINGE LUER LOCK) 23G X 1" 3 ML MISC 1 Package by Does not apply route as needed. 50 each 0  . Vitamin D, Cholecalciferol, 1000 UNITS TABS Take by mouth.     No  current facility-administered medications on file prior to visit.    Allergies:  Allergies  Allergen Reactions  . Codeine Other (See Comments)    "makes me crazy; see things; delusional"  . Erythromycin Other (See Comments)    "peeled skin; like a sunburn & I turn the color of the pill; a kind of purple-look"  . Lisinopril Cough    "cause I have asthma"  . Penicillins Rash and Other (See Comments)    "puffy blisters  . Sulfonamide Derivatives Hives    "watery hives"  . Actos [Pioglitazone]     Upset GI  . Amlodipine     Syncope  . Benicar [Olmesartan] Other (See Comments)    Dizziness and HA  . Byetta 10 Mcg Pen [Exenatide] Nausea And  Vomiting    5 MCG pen  . Glimepiride     Upset GI  . Glipizide     Upset GI  . Glyburide-Metformin     Myalgias  . Humalog [Insulin Lispro]     Headache  . Invokana [Canagliflozin]     Yeast infections  . Januvia [Sitagliptin]     uti  . Reglan [Metoclopramide]     unknown  . Septra [Sulfamethoxazole-Trimethoprim] Hives  . Spironolactone     unk  . Victoza [Liraglutide] Diarrhea    Abdominal pain   . Zocor [Simvastatin]     unknown  . Losartan Potassium Rash    Upset GI  . Novolin R [Insulin] Swelling and Rash    70/30    Review of Systems:  CONSTITUTIONAL: No fevers, chills, night sweats, or weight loss.  EYES: No visual changes or eye pain ENT: No hearing changes.  No history of nose bleeds.   RESPIRATORY: No cough, wheezing and shortness of breath.   CARDIOVASCULAR: Negative for chest pain, and palpitations.   GI: Negative for abdominal discomfort, blood in stools or black stools.  No recent change in bowel habits.   GU:  No history of incontinence.   MUSCLOSKELETAL: No history of joint pain or swelling.  No myalgias.   SKIN: Negative for lesions, rash, and itching.   ENDOCRINE: Negative for cold or heat intolerance, polydipsia or goiter.   PSYCH:  No depression or anxiety symptoms.   NEURO: As Above.   Vital Signs:  BP 130/70 mmHg  Pulse 73  Ht 5\' 3"  (1.6 m)  Wt 185 lb 3 oz (84 kg)  BMI 32.81 kg/m2  SpO2 96%  Neurological Exam: MENTAL STATUS including orientation to time, place, person, recent and remote memory, attention span and concentration, language, and fund of knowledge is normal.  Speech is not dysarthric.  CRANIAL NERVES:  Pupils equal round and reactive to light.  Normal conjugate, extra-ocular eye movements in all directions of gaze.  No ptosis. Face is symmetric.  MOTOR:  Motor strength is 5/5 in all extremities.  No atrophy, fasciculations or abnormal movements.  No pronator drift.  Tone is normal.    MSRs:  Reflexes are 2+/4 throughout,  except right patella is 1+/4.  SENSORY:  Intact to vibration.  COORDINATION/GAIT:  Normal finger-to- nose-finger and heel-to-shin.  Gait narrow based and stable. Tandem gait intact.  Data: Labs 06/06/2014:  Acetylcholine modulating antibody positive 35* (<32 is normal), binding and blocking is negative CK 91, aldolase 4.8, TSH 2.337, vitamin B12 233  MRI brain wo contrast 07/18/2013:  Mild chronic microvascular ischemic change.  No acute abnormality  MRI lumbar spine 01/26/2006: 1.  Central protrusion at L4-5 with posterior element hypertrophy;  mild bilateral L5 nerve root encroachment is observed right greater than left.   2.  Shallow right paracentral protrusion L1-2 without neural encroachment.   3.  Lower lumbar facet arthropathy worst at L3-4 and L4-5.  Labs 05/01/2014:  HbA1c 10.1  EMG 07/18/2014: Impression: 1. Chronic L5 radiculopathy affecting bilateral lower extremities, mild in degree electrically. 2. There is no evidence of a generalized sensorimotor polyneuropathy or neuromuscular junction disorder.  Lab Results  Component Value Date   HGBA1C 7.7* 04/16/2011     IMPRESSION/PLAN: Mrs. Kandler is a 69 year-old female with insulin dependent diabetes mellitus who is returning for follow-up of episodic leg weakness and gait difficulty.  To further evaluate her symptoms, I ordered screening testing for myasthenia which returned mildly positive for AChR modulating antibody, however there was no evidence of decrement on her EMG. No evidence of neuropathy either, which is great given her history of uncontrolled diabetes. Because she is asymptomatic and my suspicion for MG is low, no medications are indicated.  Alternatively, she has a long history of low back pain and her MRI lumbar spine from 2008 demonstrates disc protrusion at L4-5 and mild bilateral L5 nerve root encroachment as well as multilevel facet arthropathy.   She is fortunately doing great since start vitamin B12  supplementation and having her diabetes medications adjusted and it is possible all symptoms were stemming from variation in blood sugars as well as B12 deficiency.     PLAN/RECOMMENDATIONS:  1.  Continue Vitamin B12 monthly thereafter x 1 year.  2.  Return to clinic as needed   The duration of this appointment visit was 15 minutes of face-to-face time with the patient.  Greater than 50% of this time was spent in counseling, explanation of diagnosis, planning of further management, and coordination of care.   Thank you for allowing me to participate in patient's care.  If I can answer any additional questions, I would be pleased to do so.    Sincerely,    Jazier Mcglamery K. Allena Katz, DO

## 2014-12-05 ENCOUNTER — Ambulatory Visit (INDEPENDENT_AMBULATORY_CARE_PROVIDER_SITE_OTHER): Payer: Medicare Other | Admitting: Internal Medicine

## 2014-12-05 ENCOUNTER — Encounter: Payer: Self-pay | Admitting: Internal Medicine

## 2014-12-05 VITALS — BP 104/62 | HR 84 | Temp 98.2°F | Resp 12 | Wt 176.4 lb

## 2014-12-05 DIAGNOSIS — Z794 Long term (current) use of insulin: Secondary | ICD-10-CM

## 2014-12-05 DIAGNOSIS — E1122 Type 2 diabetes mellitus with diabetic chronic kidney disease: Secondary | ICD-10-CM

## 2014-12-05 DIAGNOSIS — N183 Chronic kidney disease, stage 3 unspecified: Secondary | ICD-10-CM

## 2014-12-05 LAB — BASIC METABOLIC PANEL
BUN: 22 mg/dL (ref 6–23)
CO2: 28 mEq/L (ref 19–32)
CREATININE: 1.36 mg/dL — AB (ref 0.40–1.20)
Calcium: 10 mg/dL (ref 8.4–10.5)
Chloride: 103 mEq/L (ref 96–112)
GFR: 40.7 mL/min — AB (ref >60.00)
GLUCOSE: 238 mg/dL — AB (ref 70–99)
Potassium: 3.7 mEq/L (ref 3.5–5.1)
Sodium: 141 mEq/L (ref 135–145)

## 2014-12-05 LAB — HEMOGLOBIN A1C: HEMOGLOBIN A1C: 9.3 % — AB (ref 4.6–6.5)

## 2014-12-05 MED ORDER — INSULIN GLARGINE 300 UNIT/ML ~~LOC~~ SOPN: 80.0000 [IU] | PEN_INJECTOR | Freq: Two times a day (BID) | SUBCUTANEOUS | Status: DC

## 2014-12-05 NOTE — Progress Notes (Signed)
Patient ID: Yolanda Saunders, female   DOB: 10/23/1945, 69 y.o.   MRN: [id number]  HPI: Yolanda Saunders is a 69 y.o.-year-old female, returning for follow-up for DM2, dx 1999, insulin-dependent since ~5 years ago, uncontrolled, with complications (CKD stage 3, h/o stroke post cardiac cath in 2002, macroalbuminuria). She saw Dr Sharl Ma in the past. Last visit with me 2 months ago  Last hemoglobin A1c was: 07/31/2014: 13.4%  Lab Results  Component Value Date   HGBA1C 7.7* 04/16/2011   Pt was on a regimen of: - Levemir 110 units 2x a day >> HA, swelling, weight gain  At last visit, we changed to: - Metformin ER 500 mg 2x a day >> some AP - Invokana 100 mg in a.m., before breakfast >> urinating often. No yeast inf. She is taking the Lasix as needed. - Toujeo 80 units 2x a day  She was started Bydureon 2 mg weekly - in donut hole >> did not start Tried Victoza >> helped, but AP Tried Byetta >> N/V Tried Metformin >> AP, diarrhea Tried SU >> upset stomach Tried Invokana >> yeast inf  Pt checks her sugars 1x a day and they are MUCH better: - am: 50s, 200s-300s >> 79-149 - 2h after b'fast: n/c - before lunch: n/c >> 113-158, 166 - 2h after lunch: n/c  - before dinner: n/c >> 78, 142-171 - 2h after dinner: n/c - bedtime: n/c >> 168-181 - nighttime: n/c No lows. Lowest sugar was 57 (in am) >> 79x1.  she has hypoglycemia awareness at 60s.  Highest sugar was 590 >> 200s.  Glucometer: Free Style  Pt's meals are: - Breakfast: oatmeal - Lunch: sandwich; chicken nuggets and fries - Dinner: heaviest: meat + 2 veggies + dessert later - Snacks: "too many" - cookies, sugary snacks  - + CKD, last BUN/creatinine:  Lab Results  Component Value Date   BUN 19 10/18/2011   CREATININE 1.09 10/18/2011  Had albuminuria in 04/2014 >300 Intolerant to ACEI and ARBs. On Lasix 20 bid - but only as needed now. - last set of lipids: Lab Results  Component Value Date   CHOL 106 04/17/2011   HDL 46  04/17/2011   LDLCALC 37 04/17/2011   TRIG 116 04/17/2011   CHOLHDL 2.3 04/17/2011  She was on Lipitor >> changed to Rosuvastatin. - last eye exam was in 07/2013. No DR. Has cataracts. - no numbness and tingling in her feet.  She is seeing Dr Nita Sickle >> investigation for disequllibrium and word difficulty. 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  ROS: Constitutional: + weight loss, no fatigue, no subjective hyperthermia/hypothermia Eyes: no blurry vision, no xerophthalmia ENT: no sore throat, no nodules palpated in throat, no dysphagia/odynophagia, no hoarseness Cardiovascular: no CP/SOB/palpitations/leg swelling Respiratory: no cough/SOB Gastrointestinal: no N/V/D/C Musculoskeletal: no muscle/joint aches Skin: no rashes Neurological: no tremors/numbness/tingling/dizziness  I reviewed pt's medications, allergies, PMH, social hx, family hx, and changes were documented in the history of present illness. Otherwise, unchanged from my initial visit note.  Past Medical History  Diagnosis Date  . GERD (gastroesophageal reflux disease)   . Asthma   . Carpal tunnel syndrome   . Complication of anesthesia 1987    "affected my eyes; couldn't see anything but blurrs even the next day"  . Hypertension   . High cholesterol   . Angina 04/16/11    "that's what I'm here for"  . Pneumonia 04/17/11    "probably  as many as 7 times"  . History of bronchitis   . Shortness of breath on exertion     "cause of my asthma"  . Type II diabetes mellitus   . Anemia 04/17/11    "long, long years ago"  . Headache(784.0)   . Migraines   . Anxiety   . Panic attacks 04/17/11    "don't take anything for it"  . Stroke 2002    residual "problem w/using the right word, left 5 lesions on my brain/MRI; long term memory loss"  . IBS (irritable bowel syndrome)   . Edema   . Depression   . CVA (cerebral infarction)     After cardiac catheter 02/2000  . Cataracts,  bilateral   . Fibromyalgia   . Renal disorder 04/17/11    "they are working at 60% capacity"  . CKD (chronic kidney disease), stage III    Past Surgical History  Procedure Laterality Date  . Vaginal hysterectomy  1977  . Cholecystectomy  1987  . Carpal tunnel release  2003-2010    "twice on left; once on right"  . Debridement tennis elbow  2010  . Cardiac catheterization  2002   Social History   Social History  . Marital Status: Married   Social History Main Topics  . Smoking status: Former Smoker -- 0.75 packs/day for 6 years    Types: Cigarettes    Quit date: 01/21/1980  . Smokeless tobacco: Never Used  . Alcohol Use: No  . Drug Use: No   Social History Narrative   Lives with husband in a one story home.  Has 2 children.  Retired from [employer].  Education: 12th grade.  Trade schools.    Current Outpatient Prescriptions on File Prior to Visit  Medication Sig Dispense Refill  . albuterol (PROVENTIL HFA;VENTOLIN HFA) 108 (90 BASE) MCG/ACT inhaler Inhale 2 puffs into the lungs every 6 (six) hours as needed. For shortness of breath    . aspirin EC 81 MG tablet Take 81 mg by mouth daily.    Marland Kitchen atorvastatin (LIPITOR) 40 MG tablet Take 40 mg by mouth daily.    . canagliflozin (INVOKANA) 100 MG TABS tablet Take 1 tablet (100 mg total) by mouth daily. 30 tablet 2  . cyanocobalamin (,VITAMIN B-12,) 1000 MCG/ML injection Inject 1 mL (1,000 mcg total) into the muscle once. 25 mL 0  . furosemide (LASIX) 20 MG tablet Take 20 mg by mouth 2 (two) times daily.     . Insulin Glargine (TOUJEO SOLOSTAR) 300 UNIT/ML SOPN Inject 80 Units into the skin at bedtime. 12 pen 2  . Insulin Pen Needle (CAREFINE PEN NEEDLES) 32G X 4 MM MISC Use 2x a day 100 each 11  . metFORMIN (GLUCOPHAGE-XR) 500 MG 24 hr tablet Take 1 tablet (500 mg total) by mouth 2 (two) times daily with a meal. 60 tablet 2  . ranitidine (ZANTAC) 300 MG tablet Take 300 mg by mouth 2 (two) times daily.    . Syringe/Needle, Disp,  (SYRINGE LUER LOCK) 23G X 1" 3 ML MISC 1 Package by Does not apply route as needed. 50 each 0  . Vitamin D, Cholecalciferol, 1000 UNITS TABS Take by mouth.     No current facility-administered medications on file prior to visit.   Allergies  Allergen Reactions  . Codeine Other (See Comments)    "makes me crazy; see things; delusional"  . Erythromycin Other (See Comments)    "peeled skin; like a sunburn & I turn the  color of the pill; a kind of purple-look"  . Lisinopril Cough    "cause I have asthma"  . Penicillins Rash and Other (See Comments)    "puffy blisters  . Sulfonamide Derivatives Hives    "watery hives"  . Actos [Pioglitazone]     Upset GI  . Amlodipine     Syncope  . Benicar [Olmesartan] Other (See Comments)    Dizziness and HA  . Byetta 10 Mcg Pen [Exenatide] Nausea And Vomiting    5 MCG pen  . Glimepiride     Upset GI  . Glipizide     Upset GI  . Glyburide-Metformin     Myalgias  . Humalog [Insulin Lispro]     Headache  . Invokana [Canagliflozin]     Yeast infections  . Januvia [Sitagliptin]     uti  . Reglan [Metoclopramide]     unknown  . Septra [Sulfamethoxazole-Trimethoprim] Hives  . Spironolactone     unk  . Victoza [Liraglutide] Diarrhea    Abdominal pain   . Zocor [Simvastatin]     unknown  . Losartan Potassium Rash    Upset GI  . Novolin R [Insulin] Swelling and Rash    70/30   Family History  Problem Relation Age of Onset  . Coronary artery disease Father   . Diabetes Mellitus I Father   . CVA Mother   . Stroke Mother   . Diabetes Mellitus I Mother   . Diabetes Mellitus I Sister   . Diabetes Mellitus I Brother   . Diabetes Mellitus I Maternal Grandmother   . Diabetes Mellitus I Maternal Grandfather   . Diabetes Mellitus I Paternal Grandmother   . Diabetes Mellitus I Paternal Grandfather   . Diabetes Mellitus I Brother   . Aortic aneurysm Son    PE: BP 104/62 mmHg  Pulse 84  Temp(Src) 98.2 F (36.8 C) (Oral)  Resp 12  Wt  176 lb 6.4 oz (80.015 kg)  SpO2 98% Body mass index is 31.26 kg/(m^2). Wt Readings from Last 3 Encounters:  12/05/14 176 lb 6.4 oz (80.015 kg)  10/02/14 185 lb 3 oz (84 kg)  09/28/14 188 lb (85.276 kg)   Constitutional: overweight, in NAD Eyes: PERRLA, EOMI, no exophthalmos ENT: moist mucous membranes, no thyromegaly, no cervical lymphadenopathy Cardiovascular: RRR, No MRG Respiratory: CTA B Gastrointestinal: abdomen soft, NT, ND, BS+ Musculoskeletal: no deformities, strength intact in all 4 Skin: moist, warm, no rashes Neurological: no tremor with outstretched hands, DTR normal in all 4  ASSESSMENT: 1. DM2, insulin-dependent, uncontrolled, with complications - CKD stage 3 - h/o stroke post cardiac cath in 2002 - macroalbuminuria  PLAN:  1. Patient with long-standing, uncontrolled diabetes, previously on only basal insulin, with very fluctuating sugars, from 50s-500s, and HbA1c very high, at 13.4%! At last visit, 2 months ago, we decreased the basal insulin by 60 units (and changed to Toujeo as this is 3x more concentrated and she can inject less volume; this is also assoc. with less hypoglycemia). We also added Metformin XR (half max dose since she has CKD) and added Invokana. At this visit, sugars appear impressively improved! She also lost 12 lbs! She feels much better! - I advised her to continue current regimen: Patient Instructions  Please continue; - Metformin ER 500 mg 2x a day. - Invokana 100 mg in a.m., before breakfast - Toujeo 80 units 2x a day.  Please return in 1.5 month with your sugar log.   - continue checking sugars at different  times of the day - check at least 2 times a day, rotating checks - advised for yearly eye exams >> she needs one - will check hbA1c and BMp. Lipids were checked by PCP. Will need to get records. - Return to clinic in 1.5 mo with sugar log   Office Visit on 12/05/2014  Component Date Value Ref Range Status  . Sodium 12/05/2014 141   135 - 145 mEq/L Final  . Potassium 12/05/2014 3.7  3.5 - 5.1 mEq/L Final  . Chloride 12/05/2014 103  96 - 112 mEq/L Final  . CO2 12/05/2014 28  19 - 32 mEq/L Final  . Glucose, Bld 12/05/2014 238* 70 - 99 mg/dL Final  . BUN 46/96/295211/15/2016 22  6 - 23 mg/dL Final  . Creatinine, Ser 12/05/2014 1.36* 0.40 - 1.20 mg/dL Final  . Calcium 84/13/244011/15/2016 10.0  8.4 - 10.5 mg/dL Final  . GFR 10/27/253611/15/2016 40.70* >60.00 mL/min Final  . Hgb A1c MFr Bld 12/05/2014 9.3* 4.6 - 6.5 % Final   Glycemic Control Guidelines for People with Diabetes:Non Diabetic:  <6%Goal of Therapy: <7%Additional Action Suggested:  >8%    HbA1c decreased 4%!!! Potassium normal. GFR lower, will advise her to stay hydrated and recheck in 6 weeks. Continue Invokana for now.

## 2014-12-05 NOTE — Patient Instructions (Addendum)
Please continue; - Metformin ER 500 mg 2x a day. - Invokana 100 mg in a.m., before breakfast - Toujeo 80 units 2x a day.  Please return in 1.5-2 months with your sugar log.   Please schedule a new eye exam.  Please call and schedule an eye appt with Dr. Randon GoldsmithLyles: [facility]:  Dr. Jeni SallesLyles Graham W. MD ?  Address: [address], [place], [place] [address]  [phone]

## 2014-12-07 ENCOUNTER — Telehealth: Payer: Self-pay | Admitting: Internal Medicine

## 2014-12-07 NOTE — Telephone Encounter (Signed)
Returned pt's call. Pt stated that she was concerned about her GFR level. Pt stated that she has kidney disease and she stays well hydrated. Please advise what she should do. She is concerned.

## 2014-12-07 NOTE — Telephone Encounter (Signed)
The GFR is slightly lower than before, but this is not unusual for patients on iNVOKANA. This usually improves with hydration and I believe that at next check, her kidney function would be better. We can check a BMP sooner if she prefers, in about 3 weeks from now.

## 2014-12-07 NOTE — Telephone Encounter (Signed)
Pt has questions about the TSR level being low

## 2014-12-11 NOTE — Telephone Encounter (Signed)
Called pt and advised her per Dr Charlean SanfilippoGherghe's message. Pt voiced understanding and has decided to wait until her next appt to do any labs concerning her kidney function. Be advised.

## 2014-12-26 ENCOUNTER — Telehealth: Payer: Self-pay | Admitting: Internal Medicine

## 2014-12-26 MED ORDER — CANAGLIFLOZIN 100 MG PO TABS: 100.0000 mg | ORAL_TABLET | Freq: Every day | ORAL | Status: DC

## 2014-12-26 MED ORDER — INSULIN GLARGINE 300 UNIT/ML ~~LOC~~ SOPN: 80.0000 [IU] | PEN_INJECTOR | Freq: Two times a day (BID) | SUBCUTANEOUS | Status: DC

## 2014-12-26 MED ORDER — METFORMIN HCL ER 500 MG PO TB24: 500.0000 mg | ORAL_TABLET | Freq: Two times a day (BID) | ORAL | Status: DC

## 2014-12-26 NOTE — Telephone Encounter (Signed)
Patient called stating that she would like Shannon to send off her medications  Mrs. Guay requested her refills, states the pharmacy received a confirmation of her not being our patient   Rx: Invokana  Toujeo Metformin   [facility]    Thank you

## 2014-12-26 NOTE — Telephone Encounter (Signed)
Unsure who confirmed that she is not a patient of ours. We have not received an rx refill request for her medications. Refills have been sent to her pharmacy now.

## 2015-02-06 ENCOUNTER — Ambulatory Visit (INDEPENDENT_AMBULATORY_CARE_PROVIDER_SITE_OTHER): Payer: Medicare Other | Admitting: Internal Medicine

## 2015-02-06 ENCOUNTER — Encounter: Payer: Self-pay | Admitting: Internal Medicine

## 2015-02-06 VITALS — BP 122/62 | HR 89 | Temp 97.7°F | Resp 12 | Wt 173.4 lb

## 2015-02-06 DIAGNOSIS — E1122 Type 2 diabetes mellitus with diabetic chronic kidney disease: Secondary | ICD-10-CM

## 2015-02-06 DIAGNOSIS — B3731 Acute candidiasis of vulva and vagina: Secondary | ICD-10-CM

## 2015-02-06 DIAGNOSIS — B373 Candidiasis of vulva and vagina: Secondary | ICD-10-CM | POA: Diagnosis not present

## 2015-02-06 DIAGNOSIS — Z794 Long term (current) use of insulin: Secondary | ICD-10-CM | POA: Diagnosis not present

## 2015-02-06 LAB — BASIC METABOLIC PANEL WITH GFR
BUN: 19 mg/dL (ref 6–23)
CO2: 29 meq/L (ref 19–32)
Calcium: 9.6 mg/dL (ref 8.4–10.5)
Chloride: 106 meq/L (ref 96–112)
Creatinine, Ser: 1.36 mg/dL — ABNORMAL HIGH (ref 0.40–1.20)
GFR: 40.68 mL/min — ABNORMAL LOW (ref >60.00)
Glucose, Bld: 164 mg/dL — ABNORMAL HIGH (ref 70–99)
Potassium: 3.6 meq/L (ref 3.5–5.1)
Sodium: 143 meq/L (ref 135–145)

## 2015-02-06 MED ORDER — FLUCONAZOLE 150 MG PO TABS: 150.0000 mg | ORAL_TABLET | Freq: Once | ORAL | Status: DC

## 2015-02-06 MED ORDER — INSULIN GLARGINE 300 UNIT/ML ~~LOC~~ SOPN: 60.0000 [IU] | PEN_INJECTOR | Freq: Every day | SUBCUTANEOUS | Status: DC

## 2015-02-06 NOTE — Progress Notes (Signed)
Patient ID: Yolanda Saunders, female   DOB: 10/14/1945, 70 y.o.   MRN: [id number]  HPI: Yolanda Saunders is a 70 y.o.-year-old female, returning for follow-up for DM2, dx 1999, insulin-dependent since ~5 years ago, uncontrolled, with complications (CKD stage 3, h/o stroke post cardiac cath in 2002, macroalbuminuria). She saw Dr Sharl Ma in the past. Last visit with me 2 months ago  Her sister just died (cardiac arrest).  Last hemoglobin A1c was: Lab Results  Component Value Date   HGBA1C 9.3* 12/05/2014   HGBA1C 7.7* 04/16/2011  07/31/2014: 13.4%   Pt was on a regimen of: - Levemir 110 units 2x a day >> HA, swelling, weight gain  We changed to: - Metformin ER 500 mg 2x a day >> some AP - Invokana 100 mg in a.m., before breakfast >> urinating often. +1 yeast inf - now. She is taking the Lasix as needed. - Toujeo 80 units 1x a day (we actually decreased the dose   She was started Bydureon 2 mg weekly - in donut hole >> did not start Tried Victoza >> helped, but AP Tried Byetta >> N/V Tried Metformin >> AP, diarrhea Tried SU >> upset stomach Tried Invokana >> yeast inf  Pt checks her sugars 1x a day and they are: - am: 50s, 200s-300s >> 79-149 >> 68 this am, 90s - 2h after b'fast: n/c >> 99-131 - before lunch: n/c >> 113-158, 166 >> 130s - 2h after lunch: n/c  - before dinner: n/c >> 78, 142-171 >> n/c - 2h after dinner: n/c - bedtime: n/c >> 168-181 >> 190 - nighttime: n/c No lows. Lowest sugar was 57 (in am) >> 79x1 >> 68x1.  she has hypoglycemia awareness at 60s.  Highest sugar was 590 >> 200s >> 213.  Glucometer: Free Style  Pt's meals are: - Breakfast: oatmeal - Lunch: sandwich; chicken nuggets and fries - Dinner: heaviest: meat + 2 veggies + dessert later - Snacks: "too many" - cookies, sugary snacks  - + CKD, last BUN/creatinine:  Lab Results  Component Value Date   BUN 22 12/05/2014   CREATININE 1.36* 12/05/2014  Had albuminuria in 04/2014 >300 Intolerant to  ACEI and ARBs. On Lasix 20 bid - but only as needed now. - last set of lipids: Lab Results  Component Value Date   CHOL 106 04/17/2011   HDL 46 04/17/2011   LDLCALC 37 04/17/2011   TRIG 116 04/17/2011   CHOLHDL 2.3 04/17/2011  She was on Lipitor >> now on Rosuvastatin. - last eye exam was in 07/2013. No DR. Has cataracts. - no numbness and tingling in her feet.  She is seeing Dr Nita Sickle >> investigation for disequllibrium and word difficulty. 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  ROS: Constitutional: + weight loss, + fatigue, no subjective hyperthermia/hypothermia, + burning with urination Eyes: no blurry vision, no xerophthalmia ENT: no sore throat, no nodules palpated in throat, no dysphagia/odynophagia, no hoarseness Cardiovascular: no CP/SOB/+ palpitations/no leg swelling Respiratory: no cough/SOB Gastrointestinal: no N/V/D/+ C Musculoskeletal: no muscle/joint aches Skin: no rashes Neurological: no tremors/numbness/tingling/dizziness, + HA  I reviewed pt's medications, allergies, PMH, social hx, family hx, and changes were documented in the history of present illness. Otherwise, unchanged from my initial visit note.  Past Medical History  Diagnosis Date  . GERD (gastroesophageal reflux disease)   . Asthma   . Carpal tunnel syndrome   . Complication of anesthesia 1987    "  affected my eyes; couldn't see anything but blurrs even the next day"  . Hypertension   . High cholesterol   . Angina 04/16/11    "that's what I'm here for"  . Pneumonia 04/17/11    "probably as many as 7 times"  . History of bronchitis   . Shortness of breath on exertion     "cause of my asthma"  . Type II diabetes mellitus (HCC)   . Anemia 04/17/11    "long, long years ago"  . Headache(784.0)   . Migraines   . Anxiety   . Panic attacks 04/17/11    "don't take anything for it"  . Stroke [facility]) 2002    residual "problem w/using the right word, left 5  lesions on my brain/MRI; long term memory loss"  . IBS (irritable bowel syndrome)   . Edema   . Depression   . CVA (cerebral infarction)     After cardiac catheter 02/2000  . Cataracts, bilateral   . Fibromyalgia   . Renal disorder 04/17/11    "they are working at 60% capacity"  . CKD (chronic kidney disease), stage III    Past Surgical History  Procedure Laterality Date  . Vaginal hysterectomy  1977  . Cholecystectomy  1987  . Carpal tunnel release  2003-2010    "twice on left; once on right"  . Debridement tennis elbow  2010  . Cardiac catheterization  2002   Social History   Social History  . Marital Status: Married   Social History Main Topics  . Smoking status: Former Smoker -- 0.75 packs/day for 6 years    Types: Cigarettes    Quit date: 01/21/1980  . Smokeless tobacco: Never Used  . Alcohol Use: No  . Drug Use: No   Social History Narrative   Lives with husband in a one story home.  Has 2 children.  Retired from [employer].  Education: 12th grade.  Trade schools.    Current Outpatient Prescriptions on File Prior to Visit  Medication Sig Dispense Refill  . albuterol (PROVENTIL HFA;VENTOLIN HFA) 108 (90 BASE) MCG/ACT inhaler Inhale 2 puffs into the lungs every 6 (six) hours as needed. For shortness of breath    . aspirin EC 81 MG tablet Take 81 mg by mouth daily.    Marland Kitchen atorvastatin (LIPITOR) 40 MG tablet Take 40 mg by mouth daily.    . canagliflozin (INVOKANA) 100 MG TABS tablet Take 1 tablet (100 mg total) by mouth daily. 30 tablet 2  . cyanocobalamin (,VITAMIN B-12,) 1000 MCG/ML injection Inject 1 mL (1,000 mcg total) into the muscle once. 25 mL 0  . furosemide (LASIX) 20 MG tablet Take 20 mg by mouth 2 (two) times daily.     . Insulin Glargine (TOUJEO SOLOSTAR) 300 UNIT/ML SOPN Inject 80 Units into the skin 2 (two) times daily. 12 pen 2  . Insulin Pen Needle (CAREFINE PEN NEEDLES) 32G X 4 MM MISC Use 2x a day 100 each 11  . metFORMIN (GLUCOPHAGE-XR) 500 MG 24  hr tablet Take 1 tablet (500 mg total) by mouth 2 (two) times daily with a meal. 60 tablet 2  . ranitidine (ZANTAC) 300 MG tablet Take 300 mg by mouth 2 (two) times daily.    . Syringe/Needle, Disp, (SYRINGE LUER LOCK) 23G X 1" 3 ML MISC 1 Package by Does not apply route as needed. 50 each 0  . Vitamin D, Cholecalciferol, 1000 UNITS TABS Take by mouth.     No current facility-administered  medications on file prior to visit.   Allergies  Allergen Reactions  . Codeine Other (See Comments)    "makes me crazy; see things; delusional"  . Erythromycin Other (See Comments)    "peeled skin; like a sunburn & I turn the color of the pill; a kind of purple-look"  . Lisinopril Cough    "cause I have asthma"  . Penicillins Rash and Other (See Comments)    "puffy blisters  . Sulfonamide Derivatives Hives    "watery hives"  . Actos [Pioglitazone]     Upset GI  . Amlodipine     Syncope  . Benicar [Olmesartan] Other (See Comments)    Dizziness and HA  . Byetta 10 Mcg Pen [Exenatide] Nausea And Vomiting    5 MCG pen  . Glimepiride     Upset GI  . Glipizide     Upset GI  . Glyburide-Metformin     Myalgias  . Humalog [Insulin Lispro]     Headache  . Invokana [Canagliflozin]     Yeast infections  . Januvia [Sitagliptin]     uti  . Reglan [Metoclopramide]     unknown  . Septra [Sulfamethoxazole-Trimethoprim] Hives  . Spironolactone     unk  . Victoza [Liraglutide] Diarrhea    Abdominal pain   . Zocor [Simvastatin]     unknown  . Losartan Potassium Rash    Upset GI  . Novolin R [Insulin] Swelling and Rash    70/30   Family History  Problem Relation Age of Onset  . Coronary artery disease Father   . Diabetes Mellitus I Father   . CVA Mother   . Stroke Mother   . Diabetes Mellitus I Mother   . Diabetes Mellitus I Sister   . Diabetes Mellitus I Brother   . Diabetes Mellitus I Maternal Grandmother   . Diabetes Mellitus I Maternal Grandfather   . Diabetes Mellitus I Paternal  Grandmother   . Diabetes Mellitus I Paternal Grandfather   . Diabetes Mellitus I Brother   . Aortic aneurysm Son    PE: BP 122/62 mmHg  Pulse 89  Temp(Src) 97.7 F (36.5 C) (Oral)  Resp 12  Wt 173 lb 6.4 oz (78.654 kg)  SpO2 96% Body mass index is 30.72 kg/(m^2). Wt Readings from Last 3 Encounters:  02/06/15 173 lb 6.4 oz (78.654 kg)  12/05/14 176 lb 6.4 oz (80.015 kg)  10/02/14 185 lb 3 oz (84 kg)   Constitutional: overweight, in NAD Eyes: PERRLA, EOMI, no exophthalmos ENT: moist mucous membranes, no thyromegaly, no cervical lymphadenopathy Cardiovascular: RRR, No MRG Respiratory: CTA B Gastrointestinal: abdomen soft, NT, ND, BS+ Musculoskeletal: no deformities, strength intact in all 4 Skin: moist, warm, no rashes Neurological: no tremor with outstretched hands, DTR normal in all 4  ASSESSMENT: 1. DM2, insulin-dependent, uncontrolled, with complications - CKD stage 3 - h/o stroke post cardiac cath in 2002 - macroalbuminuria  PLAN:  1. Patient with long-standing, uncontrolled diabetes, previously on a very large dose of basal insulin (220 units daily), with very fluctuating sugars, from 50s-500s, and HbA1c very high, at 13.4%! Now with low CBGs on just 80 units along with Metformin and Invokana. Will need to decrease her insulin dose further b/c low CBGs. - I advised her to:  Patient Instructions  Please decrease Toujeo to 60 units at bedtime.  Please continue: - Metformin ER 500 mg 2x a day  - Invokana 100 mg in a.m., before breakfast   Take Diflucan 150 mg x  1.   Please return in 3 months with your sugar log.   - continue checking sugars at different times of the day - check at least 2 times a day, rotating checks - advised for yearly eye exams >> she needs one! - will check BMP. Lipids were checked by PCP. Will need to get records. - Return to clinic in 3 mo with sugar log  3. Yeast vaginitis - will use Diflucan 150 mg x1   Component     Latest Ref Rng  12/05/2014 02/06/2015           Sodium     135 - 145 mEq/L 141 143  Potassium     3.5 - 5.1 mEq/L 3.7 3.6  Chloride     96 - 112 mEq/L 103 106  CO2     19 - 32 mEq/L 28 29  Glucose     70 - 99 mg/dL 161 (H) 096 (H)  BUN     6 - 23 mg/dL 22 19  Creatinine     0.45 - 1.20 mg/dL 4.09 (H) 8.11 (H)  Calcium     8.4 - 10.5 mg/dL 91.4 9.6  GFR     >78.29 mL/min 40.70 (L) 40.68 (L)   Stable GFR. Much improved glucose. I would suggest to continue the Invokana for now, stay hydrated, and I will recheck her BMP at next visit.

## 2015-02-06 NOTE — Patient Instructions (Addendum)
Please decrease Toujeo to 60 units at bedtime.  Please continue: - Metformin ER 500 mg 2x a day  - Invokana 100 mg in a.m., before breakfast   Take Diflucan 150 mg x 1.   Please return in 3 months with your sugar log.

## 2015-02-13 ENCOUNTER — Telehealth: Payer: Self-pay | Admitting: Internal Medicine

## 2015-02-13 NOTE — Telephone Encounter (Signed)
Called pt. She said it definitely is a UTI. She has been battling it for 2 weeks. Pt said that this is a side effect of the Invokana, she knew that. She is just hoping her PCP will see her soon. Be advised.

## 2015-02-13 NOTE — Telephone Encounter (Signed)
Please read message below and advise.  

## 2015-02-13 NOTE — Telephone Encounter (Signed)
Is she having an UTI or candida vaginitis?  Due to multiple medication intolerances and allergies, I will need to defer to PCP because I am not quite sure what to give her this point. Please contact PCP and see if she needs a visit for this.

## 2015-02-13 NOTE — Telephone Encounter (Signed)
Patient called states that she is currently having symptoms of a UTI   The Diflucan is tearing her stomach up   Please call her in something for UTI due to Invokana    Thank you

## 2015-03-21 ENCOUNTER — Other Ambulatory Visit: Payer: Self-pay | Admitting: Internal Medicine

## 2015-03-28 ENCOUNTER — Ambulatory Visit: Payer: Self-pay | Admitting: Internal Medicine

## 2015-04-24 ENCOUNTER — Other Ambulatory Visit: Payer: Self-pay | Admitting: Internal Medicine

## 2015-05-07 ENCOUNTER — Ambulatory Visit (INDEPENDENT_AMBULATORY_CARE_PROVIDER_SITE_OTHER): Payer: Medicare Other | Admitting: Internal Medicine

## 2015-05-07 ENCOUNTER — Encounter: Payer: Self-pay | Admitting: Internal Medicine

## 2015-05-07 VITALS — BP 140/70 | HR 68 | Temp 97.7°F | Ht 63.0 in | Wt 178.1 lb

## 2015-05-07 DIAGNOSIS — E1122 Type 2 diabetes mellitus with diabetic chronic kidney disease: Secondary | ICD-10-CM

## 2015-05-07 DIAGNOSIS — Z794 Long term (current) use of insulin: Secondary | ICD-10-CM | POA: Diagnosis not present

## 2015-05-07 LAB — POCT GLYCOSYLATED HEMOGLOBIN (HGB A1C): HEMOGLOBIN A1C: 10.3

## 2015-05-07 MED ORDER — INSULIN GLARGINE 300 UNIT/ML ~~LOC~~ SOPN: 80.0000 [IU] | PEN_INJECTOR | Freq: Every day | SUBCUTANEOUS | Status: DC

## 2015-05-07 NOTE — Progress Notes (Signed)
Pre visit review using our clinic review tool, if applicable. No additional management support is needed unless otherwise documented below in the visit note. 

## 2015-05-07 NOTE — Patient Instructions (Addendum)
Please increase Toujeo to 80 units at bedtime.   Try to decrease (or may have to stop): - Metformin ER 500 mg 2x a day  Please continue: - Invokana 100 mg in a.m., before breakfast    Please return in 3 months with your sugar log.   Please stop at the lab.

## 2015-05-07 NOTE — Progress Notes (Signed)
Patient ID: Yolanda Saunders, female   DOB: 06-Nov-1945, 70 y.o.   MRN: [id number]  HPI: Yolanda Saunders is a 70 y.o.-year-old female, returning for follow-up for DM2, dx 1999, insulin-dependent since ~5 years ago, uncontrolled, with complications (CKD stage 3, h/o stroke post cardiac cath in 2002, macroalbuminuria). She saw Dr Yolanda Saunders in the past. Last visit with me 3 months ago.  Her sister passed away few months ago. She has depression and ate more since last visit.   Last hemoglobin A1c was: Lab Results  Component Value Date   HGBA1C 9.3* 12/05/2014   HGBA1C 7.7* 04/16/2011  07/31/2014: 13.4%   Pt was on a regimen of: - Levemir 110 units 2x a day >> HA, swelling, weight gain  We changed to: - Metformin ER 500 mg 2x a day >> some AP, but does have IBS - Invokana 100 mg in a.m., before breakfast >> yeast inf's. Recently had a UTI >> tx'ed by PCP. - Toujeo 80 >> 60 units 1x a day (we actually decreased the dose) She was started Bydureon 2 mg weekly - in donut hole >> did not start Tried Victoza >> helped, but AP Tried Byetta >> N/V Tried Metformin >> AP, diarrhea Tried SU >> upset stomach, nausea Tried Invokana >> yeast inf  Pt checks her sugars 1x a day and they are (no log): - am: 50s, 200s-300s >> 79-149 >> 68 this am, 90s >> 160s-180s, 193 - 2h after b'fast: n/c >> 99-131 >> n/c - before lunch: n/c >> 113-158, 166 >> 130s >> 170s-180s - 2h after lunch: n/c  - before dinner: n/c >> 78, 142-171 >> n/c >> 210-280s - 2h after dinner: n/c - bedtime: n/c >> 168-181 >> 190 >> 300s - nighttime: n/c No lows. Lowest sugar was 57 (in am) >> 79x1 >> 68x1.  she has hypoglycemia awareness at 60s.  Highest sugar was 590 >> 200s >> 213.  Glucometer: Free Style  Pt's meals are: - Breakfast: oatmeal - Lunch: sandwich; chicken nuggets and fries - Dinner: heaviest: meat + 2 veggies + dessert later - Snacks: "too many" - cookies, sugary snacks  - + CKD, last BUN/creatinine:  Lab Results   Component Value Date   BUN 19 02/06/2015   CREATININE 1.36* 02/06/2015  Had albuminuria in 04/2014 >300 Intolerant to ACEI and ARBs. On Lasix 20 bid - but only as needed now. - last set of lipids: Lab Results  Component Value Date   CHOL 106 04/17/2011   HDL 46 04/17/2011   LDLCALC 37 04/17/2011   TRIG 116 04/17/2011   CHOLHDL 2.3 04/17/2011  She was on Lipitor >> now on Rosuvastatin. - last eye exam was in 07/2013. No DR. Has cataracts. - no numbness and tingling in her feet.  She is seeing Dr Yolanda Saunders >> investigation for disequllibrium and word difficulty. 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  ROS: Constitutional: + weight gain, no fatigue, no subjective hyperthermia/hypothermia Eyes: no blurry vision, no xerophthalmia ENT: no sore throat, no nodules palpated in throat, no dysphagia/odynophagia, no hoarseness Cardiovascular: no CP/SOB/palpitations/no leg swelling Respiratory: no cough/SOB Gastrointestinal: no N/V/D/+ C/+ AP Musculoskeletal: no muscle/joint aches Skin: no rashes Neurological: no tremors/numbness/tingling/dizziness  I reviewed pt's medications, allergies, PMH, social hx, family hx, and changes were documented in the history of present illness. Otherwise, unchanged from my initial visit note.  Past Medical History  Diagnosis Date  . GERD (gastroesophageal reflux disease)   .  Asthma   . Carpal tunnel syndrome   . Complication of anesthesia 1987    "affected my eyes; couldn't see anything but blurrs even the next day"  . Hypertension   . High cholesterol   . Angina 04/16/11    "that's what I'm here for"  . Pneumonia 04/17/11    "probably as many as 7 times"  . History of bronchitis   . Shortness of breath on exertion     "cause of my asthma"  . Type II diabetes mellitus (HCC)   . Anemia 04/17/11    "long, long years ago"  . Headache(784.0)   . Migraines   . Anxiety   . Panic attacks 04/17/11    "don't  take anything for it"  . Stroke [facility]) 2002    residual "problem w/using the right word, left 5 lesions on my brain/MRI; long term memory loss"  . IBS (irritable bowel syndrome)   . Edema   . Depression   . CVA (cerebral infarction)     After cardiac catheter 02/2000  . Cataracts, bilateral   . Fibromyalgia   . Renal disorder 04/17/11    "they are working at 60% capacity"  . CKD (chronic kidney disease), stage III    Past Surgical History  Procedure Laterality Date  . Vaginal hysterectomy  1977  . Cholecystectomy  1987  . Carpal tunnel release  2003-2010    "twice on left; once on right"  . Debridement tennis elbow  2010  . Cardiac catheterization  2002   Social History   Social History  . Marital Status: Married   Social History Main Topics  . Smoking status: Former Smoker -- 0.75 packs/day for 6 years    Types: Cigarettes    Quit date: 01/21/1980  . Smokeless tobacco: Never Used  . Alcohol Use: No  . Drug Use: No   Social History Narrative   Lives with husband in a one story home.  Has 2 children.  Retired from [employer].  Education: 12th grade.  Trade schools.    Current Outpatient Prescriptions on File Prior to Visit  Medication Sig Dispense Refill  . albuterol (PROVENTIL HFA;VENTOLIN HFA) 108 (90 BASE) MCG/ACT inhaler Inhale 2 puffs into the lungs every 6 (six) hours as needed. For shortness of breath    . aspirin EC 81 MG tablet Take 81 mg by mouth daily.    Marland Kitchen atorvastatin (LIPITOR) 40 MG tablet Take 40 mg by mouth daily.    . cyanocobalamin (,VITAMIN B-12,) 1000 MCG/ML injection Inject 1 mL (1,000 mcg total) into the muscle once. 25 mL 0  . fluconazole (DIFLUCAN) 150 MG tablet Take 1 tablet (150 mg total) by mouth once. 1 tablet 1  . furosemide (LASIX) 20 MG tablet Take 20 mg by mouth 2 (two) times daily.     . Insulin Glargine (TOUJEO SOLOSTAR) 300 UNIT/ML SOPN Inject 60 Units into the skin at bedtime. 9 pen 2  . Insulin Pen Needle (CAREFINE PEN NEEDLES) 32G  X 4 MM MISC Use 2x a day 100 each 11  . INVOKANA 100 MG TABS tablet TAKE ONE TABLET BY MOUTH ONCE DAILY 30 tablet 2  . metFORMIN (GLUCOPHAGE-XR) 500 MG 24 hr tablet TAKE ONE TABLET BY MOUTH TWICE DAILY WITH MEALS 60 tablet 2  . ranitidine (ZANTAC) 300 MG tablet Take 300 mg by mouth 2 (two) times daily.    . Syringe/Needle, Disp, (SYRINGE LUER LOCK) 23G X 1" 3 ML MISC 1 Package by Does not  apply route as needed. 50 each 0  . TOUJEO SOLOSTAR 300 UNIT/ML SOPN INJECT 80 UNITS SUBCUTANEOUSLY TWICE DAILY 12 pen 1  . Vitamin D, Cholecalciferol, 1000 UNITS TABS Take by mouth.     No current facility-administered medications on file prior to visit.   Allergies  Allergen Reactions  . Codeine Other (See Comments)    "makes me crazy; see things; delusional"  . Erythromycin Other (See Comments)    "peeled skin; like a sunburn & I turn the color of the pill; a kind of purple-look"  . Lisinopril Cough    "cause I have asthma"  . Penicillins Rash and Other (See Comments)    "puffy blisters  . Sulfonamide Derivatives Hives    "watery hives"  . Actos [Pioglitazone]     Upset GI  . Amlodipine     Syncope  . Benicar [Olmesartan] Other (See Comments)    Dizziness and HA  . Byetta 10 Mcg Pen [Exenatide] Nausea And Vomiting    5 MCG pen  . Glimepiride     Upset GI  . Glipizide     Upset GI  . Glyburide-Metformin     Myalgias  . Humalog [Insulin Lispro]     Headache  . Invokana [Canagliflozin]     Yeast infections  . Januvia [Sitagliptin]     uti  . Reglan [Metoclopramide]     unknown  . Septra [Sulfamethoxazole-Trimethoprim] Hives  . Spironolactone     unk  . Victoza [Liraglutide] Diarrhea    Abdominal pain   . Zocor [Simvastatin]     unknown  . Losartan Potassium Rash    Upset GI  . Novolin R [Insulin] Swelling and Rash    70/30   Family History  Problem Relation Age of Onset  . Coronary artery disease Father   . Diabetes Mellitus I Father   . CVA Mother   . Stroke Mother   .  Diabetes Mellitus I Mother   . Diabetes Mellitus I Sister   . Diabetes Mellitus I Brother   . Diabetes Mellitus I Maternal Grandmother   . Diabetes Mellitus I Maternal Grandfather   . Diabetes Mellitus I Paternal Grandmother   . Diabetes Mellitus I Paternal Grandfather   . Diabetes Mellitus I Brother   . Aortic aneurysm Son    PE: BP 140/70 mmHg  Pulse 68  Temp(Src) 97.7 F (36.5 C) (Oral)  Ht 5\' 3"  (1.6 m)  Wt 178 lb 2 oz (80.797 kg)  BMI 31.56 kg/m2  SpO2 98% Body mass index is 31.56 kg/(m^2). Wt Readings from Last 3 Encounters:  05/07/15 178 lb 2 oz (80.797 kg)  02/06/15 173 lb 6.4 oz (78.654 kg)  12/05/14 176 lb 6.4 oz (80.015 kg)   Constitutional: overweight, in NAD Eyes: PERRLA, EOMI, no exophthalmos ENT: moist mucous membranes, no thyromegaly, no cervical lymphadenopathy Cardiovascular: RRR, No MRG Respiratory: CTA B Gastrointestinal: abdomen soft, NT, ND, BS+ Musculoskeletal: no deformities, strength intact in all 4 Skin: moist, warm, no rashes Neurological: no tremor with outstretched hands, DTR normal in all 4  ASSESSMENT: 1. DM2, insulin-dependent, uncontrolled, with complications - CKD stage 3 - h/o stroke post cardiac cath in 2002 - macroalbuminuria  PLAN:  1. Patient with long-standing, uncontrolled diabetes, previously on a very large dose of basal insulin (220 units daily), with very fluctuating sugars, from 50s-500s, and HbA1c very high, at 13.4%! As she started to have low CBGs, we started to decrease Toujeo 80, then 60 units along with Metformin and  Invokana. At this visit >> we have to stop Metformin 2/2 AP/gas. I advised her that if there is no improvement in her GI sxs after stopping Metformin >> to restart. We will also need to increase Toujeo back as sugars much higher. She admits for being depressed after her sister's unexpected death >> eats "everything in sight". Advised her to try to get back on previous diet. - I advised her to:  Patient  Instructions  Please increase Toujeo to 80 units at bedtime.   Try to decrease (or may have to stop): - Metformin ER 500 mg 2x a day  Please continue: - Invokana 100 mg in a.m., before breakfast    Please return in 3 months with your sugar log.   Please stop at the lab.  - continue checking sugars at different times of the day - check at least 2 times a day, rotating checks - advised for yearly eye exams >> she needs one! - check HbA1c today >> 10.3% (higher) - will check CMP and Lipids today - Return to clinic in 3 mo with sugar log  Office Visit on 05/07/2015  Component Date Value Ref Range Status  . Hemoglobin A1C 05/07/2015 10.3   Final   Component     Latest Ref Rng 05/08/2015  Sodium     135 - 145 mEq/L 143  Potassium     3.5 - 5.1 mEq/L 3.7  Chloride     96 - 112 mEq/L 107  CO2     19 - 32 mEq/L 29  Glucose     70 - 99 mg/dL 161 (H)  BUN     6 - 23 mg/dL 22  Creatinine     0.96 - 1.20 mg/dL 0.45  Total Bilirubin     0.2 - 1.2 mg/dL 0.5  Alkaline Phosphatase     39 - 117 U/L 48  AST     0 - 37 U/L 11  ALT     0 - 35 U/L 15  Total Protein     6.0 - 8.3 g/dL 6.5  Albumin     3.5 - 5.2 g/dL 4.0  Calcium     8.4 - 10.5 mg/dL 9.8  GFR     >40.98 mL/min 49.33 (L)  Cholesterol     0 - 200 mg/dL 119  Triglycerides     0.0 - 149.0 mg/dL 147.8  HDL Cholesterol     >39.00 mg/dL 29.56 (L)  VLDL     0.0 - 40.0 mg/dL 21.3  LDL (calc)     0 - 99 mg/dL 086 (H)  Total CHOL/HDL Ratio      4  NonHDL      130.88  Kidney function improving. HDL cholesterol slightly low and LDL slightly high.

## 2015-05-08 ENCOUNTER — Other Ambulatory Visit: Payer: Self-pay | Admitting: *Deleted

## 2015-05-08 ENCOUNTER — Encounter: Payer: Self-pay | Admitting: *Deleted

## 2015-05-08 ENCOUNTER — Other Ambulatory Visit (INDEPENDENT_AMBULATORY_CARE_PROVIDER_SITE_OTHER): Payer: Medicare Other

## 2015-05-08 DIAGNOSIS — E785 Hyperlipidemia, unspecified: Secondary | ICD-10-CM | POA: Diagnosis not present

## 2015-05-08 DIAGNOSIS — E1122 Type 2 diabetes mellitus with diabetic chronic kidney disease: Secondary | ICD-10-CM

## 2015-05-08 LAB — COMPREHENSIVE METABOLIC PANEL
ALBUMIN: 4 g/dL (ref 3.5–5.2)
ALT: 15 U/L (ref 0–35)
AST: 11 U/L (ref 0–37)
Alkaline Phosphatase: 48 U/L (ref 39–117)
BUN: 22 mg/dL (ref 6–23)
CALCIUM: 9.8 mg/dL (ref 8.4–10.5)
CHLORIDE: 107 meq/L (ref 96–112)
CO2: 29 mEq/L (ref 19–32)
CREATININE: 1.15 mg/dL (ref 0.40–1.20)
GFR: 49.33 mL/min — AB (ref >60.00)
Glucose, Bld: 127 mg/dL — ABNORMAL HIGH (ref 70–99)
POTASSIUM: 3.7 meq/L (ref 3.5–5.1)
Sodium: 143 mEq/L (ref 135–145)
Total Bilirubin: 0.5 mg/dL (ref 0.2–1.2)
Total Protein: 6.5 g/dL (ref 6.0–8.3)

## 2015-05-08 LAB — LIPID PANEL
CHOLESTEROL: 169 mg/dL (ref 0–200)
HDL: 38.6 mg/dL — AB (ref >39.00)
LDL CALC: 108 mg/dL — AB (ref 0–99)
NonHDL: 130.88
TRIGLYCERIDES: 116 mg/dL (ref 0.0–149.0)
Total CHOL/HDL Ratio: 4
VLDL: 23.2 mg/dL (ref 0.0–40.0)

## 2015-06-19 ENCOUNTER — Emergency Department (HOSPITAL_COMMUNITY): Payer: Medicare Other

## 2015-06-19 ENCOUNTER — Encounter (HOSPITAL_COMMUNITY): Payer: Self-pay | Admitting: Emergency Medicine

## 2015-06-19 ENCOUNTER — Emergency Department (HOSPITAL_COMMUNITY)
Admission: EM | Admit: 2015-06-19 | Discharge: 2015-06-19 | Disposition: A | Discharge status: Home / Self Care | Payer: Medicare Other | Attending: Emergency Medicine | Admitting: Emergency Medicine

## 2015-06-19 DIAGNOSIS — M545 Low back pain: Secondary | ICD-10-CM | POA: Diagnosis present

## 2015-06-19 DIAGNOSIS — E7800 Pure hypercholesterolemia, unspecified: Secondary | ICD-10-CM | POA: Insufficient documentation

## 2015-06-19 DIAGNOSIS — I209 Angina pectoris, unspecified: Secondary | ICD-10-CM | POA: Diagnosis not present

## 2015-06-19 DIAGNOSIS — Z8669 Personal history of other diseases of the nervous system and sense organs: Secondary | ICD-10-CM | POA: Diagnosis not present

## 2015-06-19 DIAGNOSIS — Z8673 Personal history of transient ischemic attack (TIA), and cerebral infarction without residual deficits: Secondary | ICD-10-CM | POA: Diagnosis not present

## 2015-06-19 DIAGNOSIS — K219 Gastro-esophageal reflux disease without esophagitis: Secondary | ICD-10-CM | POA: Insufficient documentation

## 2015-06-19 DIAGNOSIS — I129 Hypertensive chronic kidney disease with stage 1 through stage 4 chronic kidney disease, or unspecified chronic kidney disease: Secondary | ICD-10-CM | POA: Diagnosis not present

## 2015-06-19 DIAGNOSIS — Z7984 Long term (current) use of oral hypoglycemic drugs: Secondary | ICD-10-CM | POA: Diagnosis not present

## 2015-06-19 DIAGNOSIS — M5441 Lumbago with sciatica, right side: Secondary | ICD-10-CM | POA: Insufficient documentation

## 2015-06-19 DIAGNOSIS — E1122 Type 2 diabetes mellitus with diabetic chronic kidney disease: Secondary | ICD-10-CM | POA: Insufficient documentation

## 2015-06-19 DIAGNOSIS — Z794 Long term (current) use of insulin: Secondary | ICD-10-CM | POA: Insufficient documentation

## 2015-06-19 DIAGNOSIS — Z87891 Personal history of nicotine dependence: Secondary | ICD-10-CM | POA: Diagnosis not present

## 2015-06-19 DIAGNOSIS — Z8659 Personal history of other mental and behavioral disorders: Secondary | ICD-10-CM | POA: Diagnosis not present

## 2015-06-19 DIAGNOSIS — Z9889 Other specified postprocedural states: Secondary | ICD-10-CM | POA: Diagnosis not present

## 2015-06-19 DIAGNOSIS — J45909 Unspecified asthma, uncomplicated: Secondary | ICD-10-CM | POA: Insufficient documentation

## 2015-06-19 DIAGNOSIS — Z862 Personal history of diseases of the blood and blood-forming organs and certain disorders involving the immune mechanism: Secondary | ICD-10-CM | POA: Insufficient documentation

## 2015-06-19 DIAGNOSIS — Z8701 Personal history of pneumonia (recurrent): Secondary | ICD-10-CM | POA: Insufficient documentation

## 2015-06-19 DIAGNOSIS — N183 Chronic kidney disease, stage 3 (moderate): Secondary | ICD-10-CM | POA: Insufficient documentation

## 2015-06-19 DIAGNOSIS — Z7982 Long term (current) use of aspirin: Secondary | ICD-10-CM | POA: Insufficient documentation

## 2015-06-19 DIAGNOSIS — Z880 Allergy status to penicillin: Secondary | ICD-10-CM | POA: Insufficient documentation

## 2015-06-19 MED ORDER — OXYCODONE-ACETAMINOPHEN 5-325 MG PO TABS
ORAL_TABLET | ORAL | Status: AC
  Filled 2015-06-19: qty 1

## 2015-06-19 MED ORDER — OXYCODONE-ACETAMINOPHEN 5-325 MG PO TABS
1.0000 | ORAL_TABLET | ORAL | Status: DC | PRN
  Administered 2015-06-19: 1 via ORAL

## 2015-06-19 MED ORDER — CYCLOBENZAPRINE HCL 5 MG PO TABS: 5.0000 mg | ORAL_TABLET | Freq: Three times a day (TID) | ORAL | Status: DC | PRN

## 2015-06-19 NOTE — ED Notes (Signed)
Pt sts lower back pain with radiation to right leg after picking up baby 2 days ago

## 2015-06-19 NOTE — ED Notes (Signed)
Patient transported to X-ray 

## 2015-06-19 NOTE — ED Notes (Signed)
Pt is in stable condition upon d/c and ambulates from ED. 

## 2015-06-19 NOTE — Discharge Instructions (Signed)

## 2015-06-19 NOTE — ED Provider Notes (Signed)
CSN: [id number]     Arrival date & time 06/19/15  1523 History  By signing my name below, I, Yolanda Saunders, attest that this documentation has been prepared under the direction and in the presence of Felicie Mornavid Emojean Gertz, NP-C. Electronically Signed: Phillis HaggisGabriella Saunders, ED Scribe. 06/19/2015. 6:30 PM.   Chief Complaint  Patient presents with  . Back Pain   Patient is a 70 y.o. female presenting with back pain. The history is provided by the patient. No language interpreter was used.  Back Pain Location:  Lumbar spine Quality:  Aching Radiates to:  R posterior upper leg and R knee Pain severity:  Mild Onset quality:  Sudden Duration:  2 days Timing:  Constant Progression:  Worsening Chronicity:  New Context: lifting heavy objects   Ineffective treatments:  None tried Associated symptoms: no bladder incontinence, no bowel incontinence, no numbness and no weakness   Risk factors: hx of osteoporosis   HPI Comments: Yolanda Saunders is a 70 y.o. female with a hx of HTN, Type II DM, stroke, CVA, CKD, and fibromyalgia who presents to the Emergency Department complaining of lower back pain that radiates to the right upper leg to the knee onset 2 days ago. Pt states that her pain began after picking up her 30 lb grandchild. She states that she has been told in the past that she has osteoporosis and believes that the heavy lifting exacerbated this. She has not taken anything for her pain. She denies bladder incontinence, bowel incontinence, numbness, or weakness.   Past Medical History  Diagnosis Date  . GERD (gastroesophageal reflux disease)   . Asthma   . Carpal tunnel syndrome   . Complication of anesthesia 1987    "affected my eyes; couldn't see anything but blurrs even the next day"  . Hypertension   . High cholesterol   . Angina 04/16/11    "that's what I'm here for"  . Pneumonia 04/17/11    "probably as many as 7 times"  . History of bronchitis   . Shortness of breath on exertion     "cause  of my asthma"  . Type II diabetes mellitus (HCC)   . Anemia 04/17/11    "long, long years ago"  . Headache(784.0)   . Migraines   . Anxiety   . Panic attacks 04/17/11    "don't take anything for it"  . Stroke [facility](HCC) 2002    residual "problem w/using the right word, left 5 lesions on my brain/MRI; long term memory loss"  . IBS (irritable bowel syndrome)   . Edema   . Depression   . CVA (cerebral infarction)     After cardiac catheter 02/2000  . Cataracts, bilateral   . Fibromyalgia   . Renal disorder 04/17/11    "they are working at 60% capacity"  . CKD (chronic kidney disease), stage III    Past Surgical History  Procedure Laterality Date  . Vaginal hysterectomy  1977  . Cholecystectomy  1987  . Carpal tunnel release  2003-2010    "twice on left; once on right"  . Debridement tennis elbow  2010  . Cardiac catheterization  2002   Family History  Problem Relation Age of Onset  . Coronary artery disease Father   . Diabetes Mellitus I Father   . CVA Mother   . Stroke Mother   . Diabetes Mellitus I Mother   . Diabetes Mellitus I Sister   . Diabetes Mellitus I Brother   . Diabetes Mellitus I Maternal  Grandmother   . Diabetes Mellitus I Maternal Grandfather   . Diabetes Mellitus I Paternal Grandmother   . Diabetes Mellitus I Paternal Grandfather   . Diabetes Mellitus I Brother   . Aortic aneurysm Son    Social History  Substance Use Topics  . Smoking status: Former Smoker -- 0.75 packs/day for 6 years    Types: Cigarettes    Quit date: 01/21/1980  . Smokeless tobacco: Never Used  . Alcohol Use: No   OB History    No data available     Review of Systems  Gastrointestinal: Negative for bowel incontinence.  Genitourinary: Negative for bladder incontinence.  Musculoskeletal: Positive for back pain.  Neurological: Negative for weakness and numbness.  All other systems reviewed and are negative.  Allergies  Codeine; Erythromycin; Lisinopril; Penicillins; Sulfonamide  derivatives; Actos; Amlodipine; Benicar; Byetta 10 mcg pen; Glimepiride; Glipizide; Glyburide-metformin; Humalog; Invokana; Januvia; Reglan; Septra; Spironolactone; Victoza; Zocor; Losartan potassium; and Novolin r  Home Medications   Prior to Admission medications   Medication Sig Start Date End Date Taking? Authorizing Provider  albuterol (PROVENTIL HFA;VENTOLIN HFA) 108 (90 BASE) MCG/ACT inhaler Inhale 2 puffs into the lungs every 6 (six) hours as needed. For shortness of breath    Historical Provider, MD  aspirin EC 81 MG tablet Take 81 mg by mouth daily.    Historical Provider, MD  atorvastatin (LIPITOR) 40 MG tablet Take 40 mg by mouth daily.    Historical Provider, MD  cyanocobalamin (,VITAMIN B-12,) 1000 MCG/ML injection Inject 1 mL (1,000 mcg total) into the muscle once. 07/21/14   Donika K Patel, DO  fluconazole (DIFLUCAN) 150 MG tablet Take 1 tablet (150 mg total) by mouth once. 02/06/15   Carlus Pavlov, MD  furosemide (LASIX) 20 MG tablet Take 20 mg by mouth 2 (two) times daily.     Historical Provider, MD  Insulin Glargine (TOUJEO SOLOSTAR) 300 UNIT/ML SOPN Inject 80 Units into the skin at bedtime. 05/07/15   Carlus Pavlov, MD  Insulin Pen Needle (CAREFINE PEN NEEDLES) 32G X 4 MM MISC Use 2x a day 09/28/14   Carlus Pavlov, MD  INVOKANA 100 MG TABS tablet TAKE ONE TABLET BY MOUTH ONCE DAILY 03/21/15   Carlus Pavlov, MD  metFORMIN (GLUCOPHAGE-XR) 500 MG 24 hr tablet TAKE ONE TABLET BY MOUTH TWICE DAILY WITH MEALS 03/21/15   Carlus Pavlov, MD  ranitidine (ZANTAC) 300 MG tablet Take 300 mg by mouth 2 (two) times daily.    Historical Provider, MD  Syringe/Needle, Disp, (SYRINGE LUER LOCK) 23G X 1" 3 ML MISC 1 Package by Does not apply route as needed. 07/21/14   Donika K Patel, DO  TOUJEO SOLOSTAR 300 UNIT/ML SOPN INJECT 80 UNITS SUBCUTANEOUSLY TWICE DAILY 04/24/15   Carlus Pavlov, MD  Vitamin D, Cholecalciferol, 1000 UNITS TABS Take by mouth.    Historical Provider, MD   BP 179/65  mmHg  Pulse 75  Temp(Src) 97.4 F (36.3 C) (Oral)  Resp 18  SpO2 98% Physical Exam  Constitutional: She is oriented to person, place, and time. She appears well-developed and well-nourished.  HENT:  Head: Normocephalic and atraumatic.  Eyes: Conjunctivae are normal.  Neck: Normal range of motion. Neck supple.  Cardiovascular: Normal rate and regular rhythm.   Pulmonary/Chest: Effort normal and breath sounds normal.  Musculoskeletal: Normal range of motion.  Tenderness to the lower back with pain radiating to the right hip and right thigh consistent with sciatica. Strength and sensation intact  Neurological: She is alert and oriented to  person, place, and time.  Skin: Skin is warm and dry.  Psychiatric: She has a normal mood and affect. Her behavior is normal.  Nursing note and vitals reviewed.   ED Course  Procedures (including critical care time) DIAGNOSTIC STUDIES: Oxygen Saturation is 98% on RA, normal by my interpretation.    COORDINATION OF CARE: 6:28 PM-Discussed treatment plan with pt at bedside and pt agreed to plan.    Labs Review Labs Reviewed - No data to display  Imaging Review Dg Lumbar Spine Complete  06/19/2015  CLINICAL DATA:  Low back injury and pain 2 days ago while lifting granddaughter. Low back pain radiating to right leg. Initial encounter. EXAM: LUMBAR SPINE - COMPLETE 4+ VIEW COMPARISON:  CT on 10/18/2011 FINDINGS: There is no evidence of lumbar spine fracture. Alignment is normal. Intervertebral disc spaces are maintained. Mild lower lumbar facet DJD seen bilaterally at L5-6. Transitional lumbosacral vertebra noted which is labeled L6 for destructive purposes on this exam. IMPRESSION: No acute findings.  Mild lower lumbar facet DJD. Electronically Signed   By: Myles Rosenthal M.D.   On: 06/19/2015 19:40   I have personally reviewed and evaluated these images and lab results as part of my medical decision-making.   EKG Interpretation None     Patient  discussed with Dr. Estell Harpin. MDM   Final diagnoses:  None  Patient with back pain.  No neurological deficits and normal neuro exam.  Patient is ambulatory.  No loss of bowel or bladder control.  No concern for cauda equina.  No fever, night sweats, weight loss, h/o cancer, IVDA, no recent procedure to back. No urinary symptoms suggestive of UTI.  Supportive care and return precaution discussed. Appears safe for discharge at this time. Follow up as indicated in discharge paperwork.    I personally performed the services described in this documentation, which was scribed in my presence. The recorded information has been reviewed and is accurate.    Felicie Morn, NP 06/20/15 [id number]  Bethann Berkshire, MD 06/21/15 [phone]

## 2015-06-25 ENCOUNTER — Other Ambulatory Visit: Payer: Self-pay | Admitting: Internal Medicine

## 2015-07-10 ENCOUNTER — Telehealth: Payer: Self-pay | Admitting: Internal Medicine

## 2015-07-10 NOTE — Telephone Encounter (Signed)
[facility]:  Dr. Jeni SallesGraham W. Lyles MD ?  Address: [address], [place], [place] [address]  [phone]

## 2015-07-10 NOTE — Telephone Encounter (Signed)
Pt lost the card of the eye md Dr. Elvera LennoxGherghe referred to please give her the # thank you!

## 2015-07-10 NOTE — Telephone Encounter (Signed)
I contacted the pt and advised of note below via voicemail. Requested a call back from the pt to discuss.

## 2015-07-10 NOTE — Telephone Encounter (Signed)
Do we still have this information?

## 2015-08-07 ENCOUNTER — Ambulatory Visit (INDEPENDENT_AMBULATORY_CARE_PROVIDER_SITE_OTHER): Payer: Medicare Other | Admitting: Internal Medicine

## 2015-08-07 ENCOUNTER — Encounter: Payer: Self-pay | Admitting: Internal Medicine

## 2015-08-07 VITALS — BP 132/74 | HR 76 | Ht 63.5 in | Wt 175.0 lb

## 2015-08-07 DIAGNOSIS — E1122 Type 2 diabetes mellitus with diabetic chronic kidney disease: Secondary | ICD-10-CM | POA: Diagnosis not present

## 2015-08-07 DIAGNOSIS — Z794 Long term (current) use of insulin: Secondary | ICD-10-CM

## 2015-08-07 DIAGNOSIS — N183 Chronic kidney disease, stage 3 unspecified: Secondary | ICD-10-CM

## 2015-08-07 LAB — POCT GLYCOSYLATED HEMOGLOBIN (HGB A1C): HEMOGLOBIN A1C: 11

## 2015-08-07 MED ORDER — INSULIN LISPRO 100 UNIT/ML (KWIKPEN): 10.0000 [IU] | PEN_INJECTOR | Freq: Three times a day (TID) | SUBCUTANEOUS | Status: DC

## 2015-08-07 MED ORDER — INSULIN PEN NEEDLE 32G X 4 MM MISC: Status: DC

## 2015-08-07 MED ORDER — INSULIN GLARGINE 300 UNIT/ML ~~LOC~~ SOPN: 60.0000 [IU] | PEN_INJECTOR | Freq: Every day | SUBCUTANEOUS | Status: DC

## 2015-08-07 NOTE — Patient Instructions (Addendum)
Please decrease Toujeo to 60 units.  Please add Novolog: - 10 units before a smaller meal - 12 units before a larger meal - 14 units before a larger meal  Stop Invokana.  Please space Lipitor 2x a week.  Please return in 1.5 months with your sugar log.

## 2015-08-07 NOTE — Progress Notes (Signed)
Patient ID: Yolanda Saunders, female   DOB: May 16, 1945, 70 y.o.   MRN: [id number]  HPI: Yolanda Saunders is a 70 y.o.-year-old female, returning for follow-up for DM2, dx 1999, insulin-dependent since ~5 years ago, uncontrolled, with complications (CKD stage 3, h/o stroke post cardiac cath in 2002, macroalbuminuria). She saw Dr Sharl Ma in the past. Last visit with me 3 months ago.  Last hemoglobin A1c was: Lab Results  Component Value Date   HGBA1C 10.3 05/07/2015   HGBA1C 9.3* 12/05/2014   HGBA1C 7.7* 04/16/2011  07/31/2014: 13.4%   Pt was on a regimen of: - Levemir 110 units 2x a day >> HA, swelling, weight gain  We changed to: She stopped Metformin ER 500 mg 2x a day because of AP, but does have IBS. She feels better after she stopped - Invokana 100 mg in a.m., before breakfast >> yeast inf's. Recently had a UTI >> tx'ed by PCP. - Toujeo 80 >> 60 >> 80 units at bedtime I suggested Bydureon 2 mg weekly - in donut hole >> did not start Tried Victoza >> helped, but AP Tried Byetta >> N/V Tried Metformin >> AP, diarrhea Tried SU >> upset stomach, nausea Has been on Invokana before >> yeast inf  Pt checks her sugars 1x a day and they are (no log): - am: 50s, 200s-300s >> 79-149 >> 68 this am, 90s >> 160s-180s, 193 >> ave 188 - 2h after b'fast: n/c >> 99-131 >> n/c - before lunch: n/c >> 113-158, 166 >> 130s >> 170s-180s >>  n/c - 2h after lunch: n/c >> 300-500 - before dinner: n/c >> 78, 142-171 >> n/c >> 210-280s >>  n/c - 2h after dinner: n/c - bedtime: n/c >> 168-181 >> 190 >> 300s >> n/c - nighttime: n/c No lows. Lowest sugar was 57 (in am) >> 79x1 >> 68x1.  she has hypoglycemia awareness at 60s.  Highest sugar was 590 >> 200s >> 213.  Glucometer: Free Style  Pt's meals are: - Breakfast: oatmeal - Lunch: sandwich; chicken nuggets and fries - Dinner: heaviest: meat + 2 veggies + dessert later - Snacks: "too many" - cookies, sugary snacks  - + CKD, last BUN/creatinine:   Lab Results  Component Value Date   BUN 22 05/08/2015   CREATININE 1.15 05/08/2015  Had albuminuria in 04/2014 >300 Intolerant to ACEI and ARBs. On Lasix 20 bid - but only as needed now. - last set of lipids: Lab Results  Component Value Date   CHOL 169 05/08/2015   HDL 38.60* 05/08/2015   LDLCALC 108* 05/08/2015   TRIG 116.0 05/08/2015   CHOLHDL 4 05/08/2015  She is on Lipitor. - last eye exam was in 07/2013. No DR. Has cataracts. Scheduled for 08/2015. - no numbness and tingling in her feet.  She is seeing Dr Nita Sickle >> investigation for disequllibrium and word difficulty. 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  ROS: Constitutional: + weight gain, no fatigue, no subjective hyperthermia/hypothermia Eyes: no blurry vision, no xerophthalmia ENT: no sore throat, no nodules palpated in throat, no dysphagia/odynophagia, no hoarseness Cardiovascular: no CP/SOB/palpitations/no leg swelling Respiratory: no cough/SOB Gastrointestinal: no N/V/D/+ C/+ AP Musculoskeletal: no muscle/joint aches Skin: no rashes Neurological: no tremors/numbness/tingling/dizziness  I reviewed pt's medications, allergies, PMH, social hx, family hx, and changes were documented in the history of present illness. Otherwise, unchanged from my initial visit note.  Past Medical History  Diagnosis Date  . GERD (gastroesophageal  reflux disease)   . Asthma   . Carpal tunnel syndrome   . Complication of anesthesia 1987    "affected my eyes; couldn't see anything but blurrs even the next day"  . Hypertension   . High cholesterol   . Angina 04/16/11    "that's what I'm here for"  . Pneumonia 04/17/11    "probably as many as 7 times"  . History of bronchitis   . Shortness of breath on exertion     "cause of my asthma"  . Type II diabetes mellitus (HCC)   . Anemia 04/17/11    "long, long years ago"  . Headache(784.0)   . Migraines   . Anxiety   . Panic attacks  04/17/11    "don't take anything for it"  . Stroke [facility](HCC) 2002    residual "problem w/using the right word, left 5 lesions on my brain/MRI; long term memory loss"  . IBS (irritable bowel syndrome)   . Edema   . Depression   . CVA (cerebral infarction)     After cardiac catheter 02/2000  . Cataracts, bilateral   . Fibromyalgia   . Renal disorder 04/17/11    "they are working at 60% capacity"  . CKD (chronic kidney disease), stage III    Past Surgical History  Procedure Laterality Date  . Vaginal hysterectomy  1977  . Cholecystectomy  1987  . Carpal tunnel release  2003-2010    "twice on left; once on right"  . Debridement tennis elbow  2010  . Cardiac catheterization  2002   Social History   Social History  . Marital Status: Married   Social History Main Topics  . Smoking status: Former Smoker -- 0.75 packs/day for 6 years    Types: Cigarettes    Quit date: 01/21/1980  . Smokeless tobacco: Never Used  . Alcohol Use: No  . Drug Use: No   Social History Narrative   Lives with husband in a one story home.  Has 2 children.  Retired from [employer].  Education: 12th grade.  Trade schools.    Current Outpatient Prescriptions on File Prior to Visit  Medication Sig Dispense Refill  . albuterol (PROVENTIL HFA;VENTOLIN HFA) 108 (90 BASE) MCG/ACT inhaler Inhale 2 puffs into the lungs every 6 (six) hours as needed. For shortness of breath    . aspirin EC 81 MG tablet Take 81 mg by mouth daily.    Marland Kitchen. atorvastatin (LIPITOR) 40 MG tablet Take 40 mg by mouth daily.    . cyclobenzaprine (FLEXERIL) 5 MG tablet Take 1 tablet (5 mg total) by mouth 3 (three) times daily as needed for muscle spasms. 15 tablet 0  . fluconazole (DIFLUCAN) 150 MG tablet Take 1 tablet (150 mg total) by mouth once. 1 tablet 1  . furosemide (LASIX) 20 MG tablet Take 20 mg by mouth 2 (two) times daily.     . INVOKANA 100 MG TABS tablet TAKE ONE TABLET BY MOUTH ONCE DAILY 30 tablet 2  . ranitidine (ZANTAC) 300 MG  tablet Take 300 mg by mouth 2 (two) times daily.    Yolanda Saunders. TOUJEO SOLOSTAR 300 UNIT/ML SOPN INJECT 80 UNITS SUBCUTANEOUSLY TWICE DAILY 13.5 mL 2  . Vitamin D, Cholecalciferol, 1000 UNITS TABS Take by mouth.    . cyanocobalamin (,VITAMIN B-12,) 1000 MCG/ML injection Inject 1 mL (1,000 mcg total) into the muscle once. (Patient not taking: Reported on 08/07/2015) 25 mL 0  . Insulin Glargine (TOUJEO SOLOSTAR) 300 UNIT/ML SOPN Inject 80 Units  into the skin at bedtime. (Patient not taking: Reported on 08/07/2015) 12 pen 2  . Insulin Pen Needle (CAREFINE PEN NEEDLES) 32G X 4 MM MISC Use 2x a day (Patient not taking: Reported on 08/07/2015) 100 each 11  . metFORMIN (GLUCOPHAGE-XR) 500 MG 24 hr tablet TAKE ONE TABLET BY MOUTH TWICE DAILY WITH MEALS (Patient not taking: Reported on 08/07/2015) 60 tablet 2  . Syringe/Needle, Disp, (SYRINGE LUER LOCK) 23G X 1" 3 ML MISC 1 Package by Does not apply route as needed. (Patient not taking: Reported on 08/07/2015) 50 each 0   No current facility-administered medications on file prior to visit.   Allergies  Allergen Reactions  . Codeine Other (See Comments)    "makes me crazy; see things; delusional"  . Erythromycin Other (See Comments)    "peeled skin; like a sunburn & I turn the color of the pill; a kind of purple-look"  . Lisinopril Cough    "cause I have asthma"  . Penicillins Rash and Other (See Comments)    "puffy blisters  . Sulfonamide Derivatives Hives    "watery hives"  . Actos [Pioglitazone]     Upset GI  . Amlodipine     Syncope  . Benicar [Olmesartan] Other (See Comments)    Dizziness and HA  . Byetta 10 Mcg Pen [Exenatide] Nausea And Vomiting    5 MCG pen  . Glimepiride     Upset GI  . Glipizide     Upset GI  . Glyburide-Metformin     Myalgias  . Humalog [Insulin Lispro]     Headache  . Invokana [Canagliflozin]     Yeast infections  . Januvia [Sitagliptin]     uti  . Reglan [Metoclopramide]     unknown  . Septra  [Sulfamethoxazole-Trimethoprim] Hives  . Spironolactone     unk  . Victoza [Liraglutide] Diarrhea    Abdominal pain   . Zocor [Simvastatin]     unknown  . Losartan Potassium Rash    Upset GI  . Novolin R [Insulin] Swelling and Rash    70/30   Family History  Problem Relation Age of Onset  . Coronary artery disease Father   . Diabetes Mellitus I Father   . CVA Mother   . Stroke Mother   . Diabetes Mellitus I Mother   . Diabetes Mellitus I Sister   . Diabetes Mellitus I Brother   . Diabetes Mellitus I Maternal Grandmother   . Diabetes Mellitus I Maternal Grandfather   . Diabetes Mellitus I Paternal Grandmother   . Diabetes Mellitus I Paternal Grandfather   . Diabetes Mellitus I Brother   . Aortic aneurysm Son    PE: BP 132/74 mmHg  Pulse 76  Ht 5' 3.5" (1.613 m)  Wt 175 lb (79.379 kg)  BMI 30.51 kg/m2  SpO2 97% Body mass index is 30.51 kg/(m^2). Wt Readings from Last 3 Encounters:  08/07/15 175 lb (79.379 kg)  05/07/15 178 lb 2 oz (80.797 kg)  02/06/15 173 lb 6.4 oz (78.654 kg)   Constitutional: overweight, in NAD Eyes: PERRLA, EOMI, no exophthalmos ENT: moist mucous membranes, no thyromegaly, no cervical lymphadenopathy Cardiovascular: RRR, No MRG Respiratory: CTA B Gastrointestinal: abdomen soft, NT, ND, BS+ Musculoskeletal: no deformities, strength intact in all 4 Skin: moist, warm, no rashes; Lipoatrophy at the site of insulin injections Neurological: no tremor with outstretched hands, DTR normal in all 4  ASSESSMENT: 1. DM2, insulin-dependent, uncontrolled, with complications - CKD stage 3 - h/o stroke post cardiac  cath in 2002 - macroalbuminuria  2. Muscle weakness  PLAN:  1. Patient with long-standing, uncontrolled diabetes, previously on a very large dose of basal insulin (220 units daily), with very fluctuating sugars, from 50s-500s, and HbA1c very high, at 13.4%! As she started to have low CBGs, we started to decrease Toujeo and tried to add  Metformin >> had to stop this 2/2 AP/gas. At this visit, sugars are very high later in the day and a drop abruptly overnight. - She would like to stop iNVOKANA because she feels that this is causing her to feel weak - At this point, due to the very high sugars and her previous intolerances, I suggested mealtime insulin. Will decrease the toujeo to accommodate the new rapid acting insulin doses. - I advised her to:  Patient Instructions  Please decrease Toujeo to 60 units.  Please add Novolog/Humalog: - 10 units before a smaller meal - 12 units before a larger meal - 14 units before a larger meal  Stop Invokana.  Please space Lipitor 2x a week.  Please return in 1.5 months with your sugar log.   - continue checking sugars at different times of the day - check at least 2 times a day, rotating checks - she has lipoatrophy at the site of her insulin injections. She is changing the needles every time she injects and rotates the sites. - advised for yearly eye exams >> she needs one! - check HbA1c today >> 11% (higher) - Return to clinic in 3 mo with sugar log  2. Muscle weakness - I advised her to switch from daily to twice a week Lipitor and see if this helps. If it does, move the Lipitor) closer together and stay on the dosing the does not give her muscle weakness  Carlus Pavlov, MD PhD [facility]

## 2015-09-03 ENCOUNTER — Telehealth: Payer: Self-pay | Admitting: Internal Medicine

## 2015-09-03 NOTE — Telephone Encounter (Signed)
Patient ask Dr Elvera LennoxGherghe to call her she want to talk to her about some things, and don't send the prescription [facility].  Patient need a refill of medication insulin lispro (HUMALOG KWIKPEN) 100 UNIT/ML KiwkPen and patient want vials instead of vials. [facility] [address] - [place] [address] [place] - [address] [phone] (Phone) [phone] (Fax)

## 2015-09-05 NOTE — Telephone Encounter (Signed)
Patient stated the she

## 2015-09-06 ENCOUNTER — Ambulatory Visit (INDEPENDENT_AMBULATORY_CARE_PROVIDER_SITE_OTHER): Payer: Medicare Other | Admitting: Internal Medicine

## 2015-09-06 ENCOUNTER — Encounter: Payer: Self-pay | Admitting: Internal Medicine

## 2015-09-06 VITALS — BP 142/78 | HR 71 | Ht 63.5 in | Wt 180.0 lb

## 2015-09-06 DIAGNOSIS — N183 Chronic kidney disease, stage 3 unspecified: Secondary | ICD-10-CM

## 2015-09-06 DIAGNOSIS — E1122 Type 2 diabetes mellitus with diabetic chronic kidney disease: Secondary | ICD-10-CM

## 2015-09-06 DIAGNOSIS — Z794 Long term (current) use of insulin: Secondary | ICD-10-CM

## 2015-09-06 MED ORDER — "INSULIN SYRINGE-NEEDLE U-100 31G X 5/16"" 0.5 ML MISC": 5 refills | Status: DC

## 2015-09-06 MED ORDER — INSULIN REGULAR HUMAN 100 UNIT/ML IJ SOLN: 25.0000 [IU] | Freq: Three times a day (TID) | INTRAMUSCULAR | 5 refills | Status: DC

## 2015-09-06 MED ORDER — INSULIN NPH (HUMAN) (ISOPHANE) 100 UNIT/ML ~~LOC~~ SUSP: SUBCUTANEOUS | 5 refills | Status: DC

## 2015-09-06 NOTE — Patient Instructions (Signed)
Please stop the Toujeo and Humalog and start the following:  Insulin Before breakfast Before lunch Before dinner  Regular 25 - smaller meal 30 - larger meal 25 - smaller meal 30 - larger meal 25 - smaller meal 30 - larger meal  NPH 40  30   Please inject the insulin 30 min before meals.  Please let me know if the sugars are consistently <80 or >200.  Please return in 1.5 months with your sugar log.

## 2015-09-06 NOTE — Progress Notes (Signed)
Patient ID: Yolanda BenesRhonda B Saunders, female   DOB: [birth year], 70 y.o.   MRN: [id number]  HPI: Yolanda Saunders is a 70 y.o.-year-old female, returning for follow-up for DM2, dx 1999, insulin-dependent since ~5 years ago, uncontrolled, with complications (CKD stage 3, h/o stroke post cardiac cath in 2002, macroalbuminuria). She saw Dr Sharl MaKerr in the past. Last visit with me 1 month ago.  Last hemoglobin A1c was: Lab Results  Component Value Date   HGBA1C 11.0 08/07/2015   HGBA1C 10.3 05/07/2015   HGBA1C 9.3 (H) 12/05/2014  07/31/2014: 13.4%   Pt was on a regimen of: - Levemir 110 units 2x a day >> HA, swelling, weight gain  We changed to: - Toujeo 60 units at bedtime - Humalog: 30 units before a meal - 2-3x a day (not with a snack) I suggested Bydureon 2 mg weekly - in donut hole >> did not start Tried Victoza >> helped, but AP She stopped Metformin ER 500 mg 2x a day because of AP, but does have IBS. She feels better after she stopped Tried Byetta >> N/V Tried Metformin >> AP, diarrhea Tried SU >> upset stomach, nausea Has been on Invokana before >> yeast inf  Pt checks her sugars 1x a day and they are (+ log): - am: 50s, 200s-300s >> 79-149 >> 68 this am, 90s >> 160s-180s, 193 >> ave 188 >> 143-266 - 2h after b'fast: n/c >> 99-131 >> n/c - before lunch: n/c >> 113-158, 166 >> 130s >> 170s-180s >>  N/c >> 99 (sick), 126-211, 312, 422 - 2h after lunch: n/c >> 300-500 >> n/c - before dinner: n/c >> 78, 142-171 >> n/c >> 210-280s >>  N/c >> 64 x1, 213-295 - 2h after dinner: n/c - bedtime: n/c >> 168-181 >> 190 >> 300s >> n/c >> 183-429 - nighttime: n/c No lows. Lowest sugar was 57 (in am) >> 79x1 >> 68x1 >> 64x1.  she has hypoglycemia awareness at 60s.  Highest sugar was 590 >> 200s >> 213 >> 429.  Glucometer: Free Style  Pt's meals are: - Breakfast: oatmeal - Lunch: sandwich; chicken nuggets and fries - Dinner: heaviest: meat + 2 veggies + dessert later - Snacks: "too many" -  cookies, sugary snacks  - + CKD, last BUN/creatinine:  Lab Results  Component Value Date   BUN 22 05/08/2015   CREATININE 1.15 05/08/2015  Had albuminuria in 04/2014 >300 Intolerant to ACEI and ARBs. On Lasix 20 bid - but only as needed now. - last set of lipids: Lab Results  Component Value Date   CHOL 169 05/08/2015   HDL 38.60 (L) 05/08/2015   LDLCALC 108 (H) 05/08/2015   TRIG 116.0 05/08/2015   CHOLHDL 4 05/08/2015  She is on Lipitor. - last eye exam was in 08/2015. No DR. Has cataracts.  - no numbness and tingling in her feet.  She is seeing Dr Nita Sickleonika Patel >> investigation for disequllibrium and word difficulty. 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  ROS: Constitutional: + weight gain, + fatigue, + subjective hypothermia Eyes: + blurry vision, no xerophthalmia ENT: + sore throat, no nodules palpated in throat, no dysphagia/odynophagia, no hoarseness Cardiovascular: no CP/SOB/palpitations/+ leg swelling Respiratory: + cough/no SOB Gastrointestinal: no N/V/D/+ C Musculoskeletal: + both: muscle/joint aches Skin: no rashes, + hair loss Neurological: no tremors/numbness/tingling/dizziness, + HA  I reviewed pt's medications, allergies, PMH, social hx, family hx, and changes were documented in the history of present  illness. Otherwise, unchanged from my initial visit note.  Past Medical History:  Diagnosis Date  . Anemia 04/17/11   "long, long years ago"  . Angina 04/16/11   "that's what I'm here for"  . Anxiety   . Asthma   . Carpal tunnel syndrome   . Cataracts, bilateral   . CKD (chronic kidney disease), stage III   . Complication of anesthesia 1987   "affected my eyes; couldn't see anything but blurrs even the next day"  . CVA (cerebral infarction)    After cardiac catheter 02/2000  . Depression   . Edema   . Fibromyalgia   . GERD (gastroesophageal reflux disease)   . Headache(784.0)   . High cholesterol   . History of  bronchitis   . Hypertension   . IBS (irritable bowel syndrome)   . Migraines   . Panic attacks 04/17/11   "don't take anything for it"  . Pneumonia 04/17/11   "probably as many as 7 times"  . Renal disorder 04/17/11   "they are working at 60% capacity"  . Shortness of breath on exertion    "cause of my asthma"  . Stroke [facility](HCC) 2002   residual "problem w/using the right word, left 5 lesions on my brain/MRI; long term memory loss"  . Type II diabetes mellitus (HCC)    Past Surgical History:  Procedure Laterality Date  . CARDIAC CATHETERIZATION  2002  . CARPAL TUNNEL RELEASE  2003-2010   "twice on left; once on right"  . CHOLECYSTECTOMY  1987  . DEBRIDEMENT TENNIS ELBOW  2010  . VAGINAL HYSTERECTOMY  1977   Social History   Social History  . Marital Status: Married   Social History Main Topics  . Smoking status: Former Smoker -- 0.75 packs/day for 6 years    Types: Cigarettes    Quit date: 01/21/1980  . Smokeless tobacco: Never Used  . Alcohol Use: No  . Drug Use: No   Social History Narrative   Lives with husband in a one story home.  Has 2 children.  Retired from [employer].  Education: 12th grade.  Trade schools.    Current Outpatient Prescriptions on File Prior to Visit  Medication Sig Dispense Refill  . albuterol (PROVENTIL HFA;VENTOLIN HFA) 108 (90 BASE) MCG/ACT inhaler Inhale 2 puffs into the lungs every 6 (six) hours as needed. For shortness of breath    . aspirin EC 81 MG tablet Take 81 mg by mouth daily.    Marland Kitchen. atorvastatin (LIPITOR) 40 MG tablet Take 40 mg by mouth daily.    . cyclobenzaprine (FLEXERIL) 5 MG tablet Take 1 tablet (5 mg total) by mouth 3 (three) times daily as needed for muscle spasms. 15 tablet 0  . fluconazole (DIFLUCAN) 150 MG tablet Take 1 tablet (150 mg total) by mouth once. 1 tablet 1  . furosemide (LASIX) 20 MG tablet Take 20 mg by mouth 2 (two) times daily.     . Insulin Glargine (TOUJEO SOLOSTAR) 300 UNIT/ML SOPN Inject 60 Units into the  skin at bedtime. 13.5 mL 2  . insulin lispro (HUMALOG KWIKPEN) 100 UNIT/ML KiwkPen Inject 0.1-0.14 mLs (10-14 Units total) into the skin 3 (three) times daily. Before meals 15 mL 5  . Insulin Pen Needle (CAREFINE PEN NEEDLES) 32G X 4 MM MISC Use 4x a day 200 each 11  . ranitidine (ZANTAC) 300 MG tablet Take 300 mg by mouth 2 (two) times daily.    . Syringe/Needle, Disp, (SYRINGE LUER LOCK) 23G X  1" 3 ML MISC 1 Package by Does not apply route as needed. (Patient not taking: Reported on 08/07/2015) 50 each 0  . Vitamin D, Cholecalciferol, 1000 UNITS TABS Take by mouth.     No current facility-administered medications on file prior to visit.    Allergies  Allergen Reactions  . Codeine Other (See Comments)    "makes me crazy; see things; delusional"  . Erythromycin Other (See Comments)    "peeled skin; like a sunburn & I turn the color of the pill; a kind of purple-look"  . Lisinopril Cough    "cause I have asthma"  . Penicillins Rash and Other (See Comments)    "puffy blisters  . Sulfonamide Derivatives Hives    "watery hives"  . Actos [Pioglitazone]     Upset GI  . Amlodipine     Syncope  . Benicar [Olmesartan] Other (See Comments)    Dizziness and HA  . Byetta 10 Mcg Pen [Exenatide] Nausea And Vomiting    5 MCG pen  . Glimepiride     Upset GI  . Glipizide     Upset GI  . Glyburide-Metformin     Myalgias  . Humalog [Insulin Lispro]     Headache  . Invokana [Canagliflozin]     Yeast infections  . Januvia [Sitagliptin]     uti  . Reglan [Metoclopramide]     unknown  . Septra [Sulfamethoxazole-Trimethoprim] Hives  . Spironolactone     unk  . Victoza [Liraglutide] Diarrhea    Abdominal pain   . Zocor [Simvastatin]     unknown  . Losartan Potassium Rash    Upset GI  . Novolin R [Insulin] Swelling and Rash    70/30   Family History  Problem Relation Age of Onset  . Coronary artery disease Father   . Diabetes Mellitus I Father   . CVA Mother   . Stroke Mother   .  Diabetes Mellitus I Mother   . Diabetes Mellitus I Sister   . Diabetes Mellitus I Brother   . Diabetes Mellitus I Maternal Grandmother   . Diabetes Mellitus I Maternal Grandfather   . Diabetes Mellitus I Paternal Grandmother   . Diabetes Mellitus I Paternal Grandfather   . Diabetes Mellitus I Brother   . Aortic aneurysm Son    PE: BP (!) 142/78 (BP Location: Left Arm, Patient Position: Sitting)   Pulse 71   Ht 5' 3.5" (1.613 m)   Wt 180 lb (81.6 kg)   SpO2 95%   BMI 31.39 kg/m  Body mass index is 31.39 kg/m. Wt Readings from Last 3 Encounters:  09/06/15 180 lb (81.6 kg)  08/07/15 175 lb (79.4 kg)  05/07/15 178 lb 2 oz (80.8 kg)   Constitutional: overweight, in NAD Eyes: PERRLA, EOMI, no exophthalmos ENT: moist mucous membranes, no thyromegaly, no cervical lymphadenopathy Cardiovascular: RRR, No MRG Respiratory: CTA B Gastrointestinal: abdomen soft, NT, ND, BS+ Musculoskeletal: no deformities, strength intact in all 4 Skin: moist, warm, no rashes; Lipoatrophy at the site of insulin injections Neurological: no tremor with outstretched hands, DTR normal in all 4  ASSESSMENT: 1. DM2, insulin-dependent, uncontrolled, with complications - CKD stage 3 - h/o stroke post cardiac cath in 2002 - macroalbuminuria  PLAN:  1. Patient with long-standing, uncontrolled diabetes, previously on a very large dose of basal insulin (220 units daily), with very fluctuating sugars, from 50s-500s >> then started to have low CBGs >> we started to decrease Toujeo and tried to add Metformin >>  had to stop this 2/2 AP/gas. At last visit, due to the very high sugars and her previous intolerances, I suggested mealtime insulin. We also decreased the toujeo to accommodate the new rapid acting insulin doses. We also stopped iNVOKANA because (causing her to feel weak) - sugars are very variable despite her increasing the Humalog significantly. The insulin is expensive for her (900$!) >> will need to switch to  N and R insulins. - latest HbA1c reviewed >> 11% (higher) - I advised her to:  Patient Instructions   Please stop the Toujeo and Humalog and start the following:  Insulin Before breakfast Before lunch Before dinner  Regular 25 - smaller meal 30 - larger meal 25 - smaller meal 30 - larger meal 25 - smaller meal 30 - larger meal  NPH 40  30   Please inject the insulin 30 min before meals.  Please let me know if the sugars are consistently <80 or >200.  Please return in 1.5 months with your sugar log.   - explained how to mix the insulin and given her written instr. About this - continue checking sugars at different times of the day - check 3 times a day, rotating checks - advised for yearly eye exams >> she is UTD - Return to clinic in 1.5 mo with sugar lo  Carlus Pavlov, MD PhD [facility] Endocrinology

## 2015-09-07 ENCOUNTER — Other Ambulatory Visit: Payer: Self-pay

## 2015-09-07 ENCOUNTER — Telehealth: Payer: Self-pay

## 2015-09-07 NOTE — Telephone Encounter (Signed)
Called and spoke with patient, she was here yesterday and had her medications changed. The fax I had received was old and everything is straight now. No questions or concerns.

## 2015-09-07 NOTE — Telephone Encounter (Signed)
Received fax from pharmacy that patient is requesting Humalog in vials, and increase dosage. I have that patient is no longer taking that medication, but taking Novolin N and Novolin R. Also per Dr.Gherghe last note, she needed to stop that and switch to these. Called to ask patient what she was taking, left call back number to discuss.

## 2015-09-25 ENCOUNTER — Ambulatory Visit: Payer: Medicare Other | Admitting: Internal Medicine

## 2015-11-01 ENCOUNTER — Ambulatory Visit (INDEPENDENT_AMBULATORY_CARE_PROVIDER_SITE_OTHER): Payer: Medicare Other | Admitting: Internal Medicine

## 2015-11-01 ENCOUNTER — Other Ambulatory Visit: Payer: Self-pay

## 2015-11-01 ENCOUNTER — Encounter: Payer: Self-pay | Admitting: Internal Medicine

## 2015-11-01 VITALS — BP 118/82 | HR 78 | Ht 64.0 in | Wt 191.0 lb

## 2015-11-01 DIAGNOSIS — Z23 Encounter for immunization: Secondary | ICD-10-CM | POA: Diagnosis not present

## 2015-11-01 DIAGNOSIS — E1122 Type 2 diabetes mellitus with diabetic chronic kidney disease: Secondary | ICD-10-CM

## 2015-11-01 DIAGNOSIS — Z794 Long term (current) use of insulin: Secondary | ICD-10-CM | POA: Diagnosis not present

## 2015-11-01 DIAGNOSIS — N183 Chronic kidney disease, stage 3 unspecified: Secondary | ICD-10-CM

## 2015-11-01 LAB — POCT GLYCOSYLATED HEMOGLOBIN (HGB A1C): Hemoglobin A1C: 9.1

## 2015-11-01 MED ORDER — CANAGLIFLOZIN 100 MG PO TABS: 100.0000 mg | ORAL_TABLET | Freq: Every day | ORAL | 5 refills | Status: DC

## 2015-11-01 MED ORDER — INSULIN LISPRO 100 UNIT/ML ~~LOC~~ SOLN: 20.0000 [IU] | Freq: Three times a day (TID) | SUBCUTANEOUS | 5 refills | Status: DC

## 2015-11-01 MED ORDER — "INSULIN SYRINGE-NEEDLE U-100 29G X 5/16"" 1 ML MISC": 5 refills | Status: DC

## 2015-11-01 MED ORDER — INSULIN GLARGINE 100 UNIT/ML ~~LOC~~ SOLN: 60.0000 [IU] | Freq: Every day | SUBCUTANEOUS | 5 refills | Status: DC

## 2015-11-01 NOTE — Addendum Note (Signed)
Addended by: Ann MakiBAILEY, Ulmer Degen T on: 11/01/2015 10:50 AM   Modules accepted: Orders

## 2015-11-01 NOTE — Patient Instructions (Signed)
Please stop N and R insulin and start: - Lantus 60 units at bedtime - Humalog:  - 20 units before a smaller meal  - 30 units before a larger meal.  Please inject Humalog 15 min before a meal.  Please also start: - Invokana 100 mg before b'fast  Please return in 1.5 months with your sugar log.

## 2015-11-01 NOTE — Progress Notes (Signed)
Patient ID: Yolanda Saunders, female   DOB: [birth year], 70 y.o.   MRN: [id number]  HPI: Yolanda Saunders is a 70 y.o.-year-old female, returning for follow-up for DM2, dx 1999, insulin-dependent since ~2012, uncontrolled, with complications (CKD stage 3, h/o stroke post cardiac cath in 2002, macroalbuminuria). She saw Dr Sharl Ma in the past. Last visit with me 2 month ago.  We changed to human insulin at last visit 2/2 price.She gained 15 lbs and has HAs.   Last hemoglobin A1c was: Lab Results  Component Value Date   HGBA1C 11.0 08/07/2015   HGBA1C 10.3 05/07/2015   HGBA1C 9.3 (H) 12/05/2014  07/31/2014: 13.4%   Pt was on a regimen of: - Levemir 110 units 2x a day >> HA, swelling, weight gain  We changed to: - Toujeo 60 units at bedtime - Humalog: 30 units before a meal - 2-3x a day (not with a snack)  But she could not afford the analog insulin products >> at last visit we changed to:  Insulin Before breakfast Before lunch Before dinner  Regular 25 - smaller meal 30 - larger meal 25 - smaller meal 30 - larger meal 25 - smaller meal 30 - larger meal  NPH 40  30   I suggested Bydureon 2 mg weekly - in donut hole >> could not start Tried Victoza >> helped, but AP She stopped Metformin ER 500 mg 2x a day because of AP, but does have IBS. She feels better after she stopped Tried Byetta >> N/V Tried Metformin >> AP, diarrhea Tried SU >> upset stomach, nausea Has been on Invokana before >> yeast inf  She had elevated lipase (70) in 09/2011 while on Victoza.  Pt checks her sugars 1x a day and they are (+ log): - am: 50s, 200s-300s >> 79-149 >> 68 this am, 90s >> 160s-180s, 193 >> ave 188 >> 143-266 >> 46x1, 81-207, 242. 264 - 2h after b'fast: n/c >> 99-131 >> n/c >> 82-263 - before lunch: n/c >> 113-158, 166 >> 130s >> 170s-180s >>  N/c >> 99 (sick), 126-211, 312, 422 >> 62, 120-211, 244, 338 - 2h after lunch: n/c >> 300-500 >> n/c  - before dinner: n/c >> 78, 142-171 >> n/c >>  210-280s >>  N/c >> 64 x1, 213-295 >> 86-232, 323 - 2h after dinner: n/c >> 189-320 - bedtime: n/c >> 168-181 >> 190 >> 300s >> n/c >> 183-429 >> n/c - nighttime: n/c No lows. Lowest sugar was 57 (in am) >> 79x1 >> 68x1 >> 64x1 >> 46.  she has hypoglycemia awareness at 60s.  Highest sugar was 590 >> 200s >> 213 >> 429 >> 338.  Glucometer: Free Style  Pt's meals are: - Breakfast: oatmeal - Lunch: sandwich; chicken nuggets and fries - Dinner: heaviest: meat + 2 veggies + dessert later - Snacks: "too many" - cookies, sugary snacks  - + CKD, last BUN/creatinine:  Lab Results  Component Value Date   BUN 22 05/08/2015   CREATININE 1.15 05/08/2015  Had albuminuria in 04/2014 >300 Intolerant to ACEI and ARBs. On Lasix 20 bid - but only as needed now. - last set of lipids: Lab Results  Component Value Date   CHOL 169 05/08/2015   HDL 38.60 (L) 05/08/2015   LDLCALC 108 (H) 05/08/2015   TRIG 116.0 05/08/2015   CHOLHDL 4 05/08/2015  She is on Lipitor. - last eye exam was in 08/2015. No DR. Has cataracts.  - no numbness and tingling in her feet.  She is seeing Dr Nita Sickleonika Patel >> investigation for disequllibrium and word difficulty. 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  ROS: Constitutional: + weight gain, + fatigue, + subjective hypothermia, + nocturia Eyes: + blurry vision, no xerophthalmia ENT: + sore throat, no nodules palpated in throat, no dysphagia/odynophagia, no hoarseness Cardiovascular: no CP/SOB/+ palpitations/+ leg swelling Respiratory: + cough/no SOB Gastrointestinal: no N/V/D/+ C/+ heartburn Musculoskeletal: + both: muscle/joint aches Skin: no rashes, + hair loss Neurological: + tremors/numbness/tingling/dizziness, + HA  I reviewed pt's medications, allergies, PMH, social hx, family hx, and changes were documented in the history of present illness. Otherwise, unchanged from my initial visit note.  Past Medical History:   Diagnosis Date  . Anemia 04/17/11   "long, long years ago"  . Angina 04/16/11   "that's what I'm here for"  . Anxiety   . Asthma   . Carpal tunnel syndrome   . Cataracts, bilateral   . CKD (chronic kidney disease), stage III   . Complication of anesthesia 1987   "affected my eyes; couldn't see anything but blurrs even the next day"  . CVA (cerebral infarction)    After cardiac catheter 02/2000  . Depression   . Edema   . Fibromyalgia   . GERD (gastroesophageal reflux disease)   . Headache(784.0)   . High cholesterol   . History of bronchitis   . Hypertension   . IBS (irritable bowel syndrome)   . Migraines   . Panic attacks 04/17/11   "don't take anything for it"  . Pneumonia 04/17/11   "probably as many as 7 times"  . Renal disorder 04/17/11   "they are working at 60% capacity"  . Shortness of breath on exertion    "cause of my asthma"  . Stroke [facility](HCC) 2002   residual "problem w/using the right word, left 5 lesions on my brain/MRI; long term memory loss"  . Type II diabetes mellitus (HCC)    Past Surgical History:  Procedure Laterality Date  . CARDIAC CATHETERIZATION  2002  . CARPAL TUNNEL RELEASE  2003-2010   "twice on left; once on right"  . CHOLECYSTECTOMY  1987  . DEBRIDEMENT TENNIS ELBOW  2010  . VAGINAL HYSTERECTOMY  1977   Social History   Social History  . Marital Status: Married   Social History Main Topics  . Smoking status: Former Smoker -- 0.75 packs/day for 6 years    Types: Cigarettes    Quit date: 01/21/1980  . Smokeless tobacco: Never Used  . Alcohol Use: No  . Drug Use: No   Social History Narrative   Lives with husband in a one story home.  Has 2 children.  Retired from [employer].  Education: 12th grade.  Tr[employer] schools.    Current Outpatient Prescriptions on File Prior to Visit  Medication Sig Dispense Refill  . albuterol (PROVENTIL HFA;VENTOLIN HFA) 108 (90 BASE) MCG/ACT inhaler Inhale 2 puffs into the lungs every 6 (six) hours as  needed. For shortness of breath    . aspirin EC 81 MG tablet Take 81 mg by mouth daily.    Marland Kitchen. atorvastatin (LIPITOR) 40 MG tablet Take 40 mg by mouth daily.    . cyclobenzaprine (FLEXERIL) 5 MG tablet Take 1 tablet (5 mg total) by mouth 3 (three) times daily as needed for muscle spasms. (Patient not taking: Reported on 09/06/2015) 15 tablet 0  . fluconazole (DIFLUCAN) 150 MG tablet Take 1 tablet (150 mg total) by  mouth once. (Patient not taking: Reported on 09/06/2015) 1 tablet 1  . furosemide (LASIX) 20 MG tablet Take 20 mg by mouth 2 (two) times daily.     . insulin NPH Human (NOVOLIN N RELION) 100 UNIT/ML injection Inject 40 units in am and 30 units in pm under skin 20 mL 5  . insulin regular (NOVOLIN R RELION) 100 units/mL injection Inject 0.25-0.3 mLs (25-30 Units total) into the skin 3 (three) times daily before meals. 20 mL 5  . Insulin Syringe-Needle U-100 31G X 5/16" 0.5 ML MISC Use 3x a day 200 each 5  . ranitidine (ZANTAC) 300 MG tablet Take 300 mg by mouth 2 (two) times daily.    . Syringe/Needle, Disp, (SYRINGE LUER LOCK) 23G X 1" 3 ML MISC 1 Package by Does not apply route as needed. 50 each 0  . Vitamin D, Cholecalciferol, 1000 UNITS TABS Take by mouth.     No current facility-administered medications on file prior to visit.    Allergies  Allergen Reactions  . Codeine Other (See Comments)    "makes me crazy; see things; delusional"  . Erythromycin Other (See Comments)    "peeled skin; like a sunburn & I turn the color of the pill; a kind of purple-look"  . Lisinopril Cough    "cause I have asthma"  . Penicillins Rash and Other (See Comments)    "puffy blisters  . Sulfonamide Derivatives Hives    "watery hives"  . Actos [Pioglitazone]     Upset GI  . Amlodipine     Syncope  . Benicar [Olmesartan] Other (See Comments)    Dizziness and HA  . Byetta 10 Mcg Pen [Exenatide] Nausea And Vomiting    5 MCG pen  . Glimepiride     Upset GI  . Glipizide     Upset GI  .  Glyburide-Metformin     Myalgias  . Humalog [Insulin Lispro]     Headache  . Invokana [Canagliflozin]     Yeast infections  . Januvia [Sitagliptin]     uti  . Reglan [Metoclopramide]     unknown  . Septra [Sulfamethoxazole-Trimethoprim] Hives  . Spironolactone     unk  . Victoza [Liraglutide] Diarrhea    Abdominal pain   . Zocor [Simvastatin]     unknown  . Losartan Potassium Rash    Upset GI  . Novolin R [Insulin] Swelling and Rash    70/30   Family History  Problem Relation Age of Onset  . Coronary artery disease Father   . Diabetes Mellitus I Father   . CVA Mother   . Stroke Mother   . Diabetes Mellitus I Mother   . Diabetes Mellitus I Sister   . Diabetes Mellitus I Brother   . Diabetes Mellitus I Maternal Grandmother   . Diabetes Mellitus I Maternal Grandfather   . Diabetes Mellitus I Paternal Grandmother   . Diabetes Mellitus I Paternal Grandfather   . Diabetes Mellitus I Brother   . Aortic aneurysm Son    PE: BP 118/82 (BP Location: Left Arm, Patient Position: Sitting)   Pulse 78   Ht 5\' 4"  (1.626 m)   Wt 191 lb (86.6 kg)   SpO2 97%   BMI 32.79 kg/m  Body mass index is 32.79 kg/m. Wt Readings from Last 3 Encounters:  11/01/15 191 lb (86.6 kg)  09/06/15 180 lb (81.6 kg)  08/07/15 175 lb (79.4 kg)   Constitutional: overweight, in NAD Eyes: PERRLA, EOMI, no exophthalmos ENT:  moist mucous membranes, no thyromegaly, no cervical lymphadenopathy Cardiovascular: RRR, No MRG Respiratory: CTA B Gastrointestinal: abdomen soft, NT, ND, BS+ Musculoskeletal: no deformities, strength intact in all 4 Skin: moist, warm, no rashes; Lipoatrophy at the site of insulin injections Neurological: no tremor with outstretched hands, DTR normal in all 4  ASSESSMENT: 1. DM2, insulin-dependent, uncontrolled, with complications - CKD stage 3 - h/o stroke post cardiac cath in 2002 - macroalbuminuria  PLAN:  1. Patient with long-standing, uncontrolled diabetes, with very  fluctuating sugars, from 40-300s. She was initially on a large basal insulin dose >> started to have low CBGs >> we started to decrease Toujeo and tried to add Metformin >> had to stop this 2/2 AP/gas. I suggested mealtime insulin next due to the very high sugars and her previous intolerances. We also stopped iNVOKANA (weakness). At last visit, due to the fact that she was in the doughnut hole, we switched to NPH and regular insulin. However, sugars are still fluctuating and she has increased weight gain and HAs >> would like to go back to analog insulins as she will try to buy them (vials). Will do this and also add back Invokana to see if will do better with UTIs and yeast inf's.  - latest HbA1c reviewed >> 11% (higher) - I advised her to:  Patient Instructions  Please stop N and R insulin and start: - Lantus 60 units at bedtime - Humalog:  - 20 units before a smaller meal  - 30 units before a larger meal.  Please inject Humalog 15 min before a meal.  Please also start: - Invokana 100 mg before b'fast  Please return in 1.5 months with your sugar log.   - continue checking sugars at different times of the day - check 3 times a day, rotating checks - advised for yearly eye exams >> she is UTD - given flu shot today - We checked HbA1c today >> 9.1% (better, still high) - Return to clinic in 1.5 mo with sugar log  Carlus Pavlov, MD PhD [facility]ocrin[facility]

## 2015-11-01 NOTE — Addendum Note (Signed)
Addended by: Darene LamerHOMPSON, Gisella Alwine T on: 11/01/2015 09:53 AM   Modules accepted: Orders

## 2015-11-02 ENCOUNTER — Other Ambulatory Visit: Payer: Self-pay

## 2015-11-02 MED ORDER — "INSULIN SYRINGE-NEEDLE U-100 30G X 1/2"" 1 ML MISC": 2 refills | Status: DC

## 2015-11-05 ENCOUNTER — Other Ambulatory Visit: Payer: Self-pay

## 2015-11-05 ENCOUNTER — Telehealth: Payer: Self-pay | Admitting: Internal Medicine

## 2015-11-05 MED ORDER — GLUCOSE BLOOD VI STRP: ORAL_STRIP | 12 refills | Status: DC

## 2015-11-05 NOTE — Telephone Encounter (Signed)
Pt called and said she is have very low sugars with the bedtime Lantus, she said they have been as low as 38. Also, she needs an Accu [facility] Test Strips [facility] [address]

## 2015-11-05 NOTE — Telephone Encounter (Signed)
I left a message on her cell phone to call us back with more info about the sugars. If not calling back, please call her tomorrow to see how her sugars are running at bedtime ad the rest of the day.

## 2015-11-06 ENCOUNTER — Other Ambulatory Visit: Payer: Self-pay

## 2015-11-06 ENCOUNTER — Other Ambulatory Visit: Payer: Self-pay | Admitting: Internal Medicine

## 2015-11-06 ENCOUNTER — Telehealth: Payer: Self-pay

## 2015-11-06 MED ORDER — GLUCOSE BLOOD VI STRP: ORAL_STRIP | 3 refills | Status: DC

## 2015-11-06 NOTE — Telephone Encounter (Signed)
Called patient, she states that she is out of town at the moment, and does not have her logs in front of her. But states she is doing the 60 units of insulin at bedtime, and when she wakes up her sugars have been as low as 38. During the day she is running high, the highest it has been is 300, but normally running 200's. Patient states today her sugars have been okay.   @ 5:30 am- 87 @ 1:00 pm- 187  She states she is having nausea in the morning and that is how she knows she has a low blood sugar. Please advise, on what to tell patient regarding blood sugars, she apologized for not calling you back yesterday.

## 2015-11-06 NOTE — Telephone Encounter (Signed)
How are her sugars at bedtime?

## 2015-11-08 ENCOUNTER — Telehealth: Payer: Self-pay

## 2015-11-08 MED ORDER — "INSULIN SYRINGE-NEEDLE U-100 31G X 15/64"" 1 ML MISC": 2 refills | Status: DC

## 2015-11-08 MED ORDER — GLUCOSE BLOOD VI STRP: ORAL_STRIP | 5 refills | Status: DC

## 2015-11-08 NOTE — Telephone Encounter (Signed)
See previous note on patient sugars.

## 2015-11-08 NOTE — Telephone Encounter (Signed)
Patient states that her Lantus at night the 60 units was too much, she is now taking 40 units. They are running 119-186 at night.    Early morning at 3:00 am is when this is happening not at bedtime.   38, 36, 46, 48, woke up sick when she checked that is what she  then changed. 4 days ago she changed the dosage. Been okay during the night since then..Marland Kitchen

## 2015-11-08 NOTE — Telephone Encounter (Signed)
Good that she decreased the dose. If she starts dropping again, needs to move Lantus in am.

## 2015-11-08 NOTE — Telephone Encounter (Signed)
Called and spoke with husband, advised him that she can keep it where she is for the units for now, but if she still had lows to let us know so we could switch the lantus to in the monring instead of at night.

## 2015-11-13 ENCOUNTER — Other Ambulatory Visit: Payer: Self-pay

## 2015-11-13 MED ORDER — GLUCOSE BLOOD VI STRP: ORAL_STRIP | 3 refills | Status: DC

## 2015-11-14 ENCOUNTER — Other Ambulatory Visit: Payer: Self-pay

## 2015-11-14 MED ORDER — ONETOUCH VERIO W/DEVICE KIT: 1.0000 | PACK | Freq: Every day | 0 refills | Status: DC

## 2015-11-14 MED ORDER — ONETOUCH ULTRASOFT LANCETS MISC: 3 refills | Status: DC

## 2015-11-14 MED ORDER — GLUCOSE BLOOD VI STRP: ORAL_STRIP | 5 refills | Status: DC

## 2015-12-17 ENCOUNTER — Ambulatory Visit (INDEPENDENT_AMBULATORY_CARE_PROVIDER_SITE_OTHER): Payer: Medicare Other | Admitting: Internal Medicine

## 2015-12-17 ENCOUNTER — Encounter: Payer: Self-pay | Admitting: Internal Medicine

## 2015-12-17 VITALS — BP 130/82 | HR 71 | Wt 188.0 lb

## 2015-12-17 DIAGNOSIS — N183 Chronic kidney disease, stage 3 unspecified: Secondary | ICD-10-CM

## 2015-12-17 DIAGNOSIS — E1122 Type 2 diabetes mellitus with diabetic chronic kidney disease: Secondary | ICD-10-CM | POA: Diagnosis not present

## 2015-12-17 DIAGNOSIS — Z794 Long term (current) use of insulin: Secondary | ICD-10-CM

## 2015-12-17 MED ORDER — INSULIN GLARGINE 100 UNIT/ML ~~LOC~~ SOLN: 50.0000 [IU] | Freq: Every day | SUBCUTANEOUS | 5 refills | Status: DC

## 2015-12-17 NOTE — Patient Instructions (Addendum)
Patient Instructions   - Lantus 50 units at bedtime - Humalog:  - 20 units before a smaller meal  - 26-30 units before a larger meal.  Please inject Humalog 15 min before a meal.  Please stop Invokana.  Please return in 1.5 months with your sugar log. .Marland Kitchen

## 2015-12-17 NOTE — Progress Notes (Signed)
Patient ID: Yolanda Saunders, female   DOB: October 17, 1945, 70 y.o.   MRN: [id number]  HPI: Yolanda Saunders is a 70 y.o.-year-old female, returning for follow-up for DM2, dx 1999, insulin-dependent since ~2012, uncontrolled, with complications (CKD stage 3, h/o stroke post cardiac cath in 2002, macroalbuminuria). She saw Dr Buddy Duty in the past. Last visit with me 2.5 month ago.  She had low CBGs since last visit: 38-43 >> we decreased Lantus.   Last hemoglobin A1c was: Lab Results  Component Value Date   HGBA1C 9.1 11/01/2015   HGBA1C 11.0 08/07/2015   HGBA1C 10.3 05/07/2015  07/31/2014: 13.4%   Pt was on a regimen of: - Levemir 110 units 2x a day >> HA, swelling, weight gain  We changed to: - Toujeo 60 units at bedtime - Humalog: 30 units before a meal - 2-3x a day (not with a snack)  But she could not afford the analog insulin products >> we changed to:  Insulin Before breakfast Before lunch Before dinner  Regular 25 - smaller meal 30 - larger meal 25 - smaller meal 30 - larger meal 25 - smaller meal 30 - larger meal  NPH 40  30   At last visit, she wanted to change back to Lantus and Humalog (She gained 15 lbs and has HAs), so she is now on: - Lantus 60 >> 50 units at bedtime - Humalog:  - 20 units before a smaller meal  - 26-30 units before a larger meal.  Please inject Humalog 15 min before a meal. - Invokana 100 mg before b'fast >> lethargy, fatigue  I suggested Bydureon 2 mg weekly - in donut hole >> could not start Tried Victoza >> helped, but AP She stopped Metformin ER 500 mg 2x a day because of AP, but does have IBS. She feels better after she stopped Tried Byetta >> N/V Tried Metformin >> AP, diarrhea, gas Tried SU >> upset stomach, nausea Has been on Invokana before >> yeast inf  She had elevated lipase (72) in 09/2011 while on Victoza.  Pt checks her sugars 1x a day and they are (+ log): - am: 160s-180s, 193 >> ave 188 >> 143-266 >> 46x1, 81-207, 242, 264 >>  62, 73-226 - 2h after b'fast: n/c >> 99-131 >> n/c >> 82-263 >> n/c - before lunch: 99 (sick), 126-211, 312, 422 >> 62, 120-211, 244, 338 >> 110, 136, 189 - 2h after lunch: n/c >> 300-500 >> n/c  - before dinner: 210-280s >>  N/c >> 64 x1, 213-295 >> 86-232, 323 >> 65-102 - 2h after dinner: n/c >> 189-320 >> n/c - bedtime: n/c >> 168-181 >> 190 >> 300s >> n/c >> 183-429 >> n/c >> 176-190 - nighttime: n/c >> 400 x1 Lowest sugar was: 46 >> 38x1.  she has hypoglycemia awareness at 60s.  Highest sugar was 590 >> 200s >> 213 >> 429 >> 338 >> 400.  Glucometer: Free Style  Pt's meals are: - Breakfast: oatmeal - Lunch: sandwich; chicken nuggets and fries - Dinner: heaviest: meat + 2 veggies + dessert later - Snacks: "too many" - cookies, sugary snacks  - + CKD, last BUN/creatinine:  Lab Results  Component Value Date   BUN 22 05/08/2015   CREATININE 1.15 05/08/2015  Had albuminuria in 04/2014 >300 Intolerant to ACEI and ARBs. On Lasix 20 bid - but only as needed now. - last set of lipids: Lab Results  Component Value Date   CHOL 169 05/08/2015   HDL 38.60 (  L) 05/08/2015   LDLCALC 108 (H) 05/08/2015   TRIG 116.0 05/08/2015   CHOLHDL 4 05/08/2015  She is on Lipitor. - last eye exam was in 08/2015. No DR. Has cataracts.  - no numbness and tingling in her feet.  She is seeing Dr Narda Amber >> investigation for disequllibrium and word difficulty. 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  ROS: Constitutional: + weight gain, + fatigue, + subjective hypothermia, + nocturia Eyes: + blurry vision, no xerophthalmia ENT: + sore throat, no nodules palpated in throat, no dysphagia/odynophagia, no hoarseness Cardiovascular: no CP/SOB/+ palpitations/+ leg swelling Respiratory: + cough/no SOB Gastrointestinal: no N/V/D/+ C/+ heartburn Musculoskeletal: + both: muscle/joint aches Skin: no rashes, + hair loss Neurological: +  tremors/numbness/tingling/dizziness, + HA  I reviewed pt's medications, allergies, PMH, social hx, family hx, and changes were documented in the history of present illness. Otherwise, unchanged from my initial visit note.  Past Medical History:  Diagnosis Date  . Anemia 04/17/11   "long, long years ago"  . Angina 04/16/11   "that's what I'm here for"  . Anxiety   . Asthma   . Carpal tunnel syndrome   . Cataracts, bilateral   . CKD (chronic kidney disease), stage III   . Complication of anesthesia 1987   "affected my eyes; couldn't see anything but blurrs even the next day"  . CVA (cerebral infarction)    After cardiac catheter 02/2000  . Depression   . Edema   . Fibromyalgia   . GERD (gastroesophageal reflux disease)   . Headache(784.0)   . High cholesterol   . History of bronchitis   . Hypertension   . IBS (irritable bowel syndrome)   . Migraines   . Panic attacks 04/17/11   "don't take anything for it"  . Pneumonia 04/17/11   "probably as many as 7 times"  . Renal disorder 04/17/11   "they are working at 60% capacity"  . Shortness of breath on exertion    "cause of my asthma"  . Stroke [facility]) 2002   residual "problem w/using the right word, left 5 lesions on my brain/MRI; long term memory loss"  . Type II diabetes mellitus ([place])    Past Surgical History:  Procedure Laterality Date  . CARDIAC CATHETERIZATION  2002  . CARPAL TUNNEL RELEASE  2003-2010   "twice on left; once on right"  . CHOLECYSTECTOMY  1987  . DEBRIDEMENT TENNIS ELBOW  2010  . VAGINAL HYSTERECTOMY  1977   Social History   Social History  . Marital Status: Married   Social History Main Topics  . Smoking status: Former Smoker -- 0.75 packs/day for 6 years    Types: Cigarettes    Quit date: 01/21/1980  . Smokeless tobacco: Never Used  . Alcohol Use: No  . Drug Use: No   Social History Narrative   Lives with husband in a one story home.  Has 2 children.  Retired from [employer].  Education:  12th grade.  Trade schools.    Current Outpatient Prescriptions on File Prior to Visit  Medication Sig Dispense Refill  . albuterol (PROVENTIL HFA;VENTOLIN HFA) 108 (90 BASE) MCG/ACT inhaler Inhale 2 puffs into the lungs every 6 (six) hours as needed. For shortness of breath    . aspirin EC 81 MG tablet Take 81 mg by mouth daily.    Marland Kitchen atorvastatin (LIPITOR) 40 MG tablet Take 40 mg by mouth daily.    . Blood  Glucose Monitoring Suppl (ONETOUCH VERIO) w/Device KIT 1 each by Does not apply route daily. 1 kit 0  . canagliflozin (INVOKANA) 100 MG TABS tablet Take 1 tablet (100 mg total) by mouth daily before breakfast. 30 tablet 5  . cyclobenzaprine (FLEXERIL) 5 MG tablet Take 1 tablet (5 mg total) by mouth 3 (three) times daily as needed for muscle spasms. (Patient not taking: Reported on 11/01/2015) 15 tablet 0  . fluconazole (DIFLUCAN) 150 MG tablet Take 1 tablet (150 mg total) by mouth once. (Patient not taking: Reported on 11/01/2015) 1 tablet 1  . furosemide (LASIX) 20 MG tablet Take 20 mg by mouth 2 (two) times daily.     Marland Kitchen glucose blood (ONETOUCH VERIO) test strip Use as instructed to check sugar 3 times daily. 200 each 5  . insulin glargine (LANTUS) 100 UNIT/ML injection Inject 0.6 mLs (60 Units total) into the skin at bedtime. 20 mL 5  . insulin lispro (HUMALOG) 100 UNIT/ML injection Inject 0.2-0.3 mLs (20-30 Units total) into the skin 3 (three) times daily before meals. 20 mL 5  . insulin NPH Human (NOVOLIN N RELION) 100 UNIT/ML injection Inject 40 units in am and 30 units in pm under skin 20 mL 5  . insulin regular (NOVOLIN R RELION) 100 units/mL injection Inject 0.25-0.3 mLs (25-30 Units total) into the skin 3 (three) times daily before meals. 20 mL 5  . Insulin Syringe-Needle U-100 (RELION INSULIN SYRINGE) 31G X 15/64" 1 ML MISC Use 3 times daily.. 300 each 2  . Lancets (ONETOUCH ULTRASOFT) lancets Use as instructed to check sugar 3 times daily. 200 each 3  . ranitidine (ZANTAC) 300 MG  tablet Take 300 mg by mouth 2 (two) times daily.    . Syringe/Needle, Disp, (SYRINGE LUER LOCK) 23G X 1" 3 ML MISC 1 Package by Does not apply route as needed. 50 each 0  . Vitamin D, Cholecalciferol, 1000 UNITS TABS Take by mouth.     No current facility-administered medications on file prior to visit.    Allergies  Allergen Reactions  . Codeine Other (See Comments)    "makes me crazy; see things; delusional"  . Erythromycin Other (See Comments)    "peeled skin; like a sunburn & I turn the color of the pill; a kind of purple-look"  . Lisinopril Cough    "cause I have asthma"  . Penicillins Rash and Other (See Comments)    "puffy blisters  . Sulfonamide Derivatives Hives    "watery hives"  . Actos [Pioglitazone]     Upset GI  . Amlodipine     Syncope  . Benicar [Olmesartan] Other (See Comments)    Dizziness and HA  . Byetta 10 Mcg Pen [Exenatide] Nausea And Vomiting    5 MCG pen  . Glimepiride     Upset GI  . Glipizide     Upset GI  . Glyburide-Metformin     Myalgias  . Humalog [Insulin Lispro]     Headache  . Invokana [Canagliflozin]     Yeast infections  . Januvia [Sitagliptin]     uti  . Reglan [Metoclopramide]     unknown  . Septra [Sulfamethoxazole-Trimethoprim] Hives  . Spironolactone     unk  . Victoza [Liraglutide] Diarrhea    Abdominal pain   . Zocor [Simvastatin]     unknown  . Losartan Potassium Rash    Upset GI  . Novolin R [Insulin] Swelling and Rash    70/30   Family History  Problem  Relation Age of Onset  . Coronary artery disease Father   . Diabetes Mellitus I Father   . CVA Mother   . Stroke Mother   . Diabetes Mellitus I Mother   . Diabetes Mellitus I Sister   . Diabetes Mellitus I Brother   . Diabetes Mellitus I Maternal Grandmother   . Diabetes Mellitus I Maternal Grandfather   . Diabetes Mellitus I Paternal Grandmother   . Diabetes Mellitus I Paternal Grandfather   . Diabetes Mellitus I Brother   . Aortic aneurysm Son     PE: BP 130/82   Pulse 71   Wt 188 lb (85.3 kg)   SpO2 97%   BMI 32.27 kg/m  There is no height or weight on file to calculate BMI. Wt Readings from Last 3 Encounters:  12/17/15 188 lb (85.3 kg)  11/01/15 191 lb (86.6 kg)  09/06/15 180 lb (81.6 kg)   Constitutional: overweight, in NAD Eyes: PERRLA, EOMI, no exophthalmos ENT: moist mucous membranes, no thyromegaly, no cervical lymphadenopathy Cardiovascular: RRR, No MRG Respiratory: CTA B Gastrointestinal: abdomen soft, NT, ND, BS+ Musculoskeletal: no deformities, strength intact in all 4 Skin: moist, warm, no rashes; Lipoatrophy at the site of insulin injections Neurological: no tremor with outstretched hands, DTR normal in all 4  ASSESSMENT: 1. DM2, insulin-dependent, uncontrolled, with complications - CKD stage 3 - h/o stroke post cardiac cath in 2002 - macroalbuminuria  PLAN:  1. Patient with long-standing, uncontrolled diabetes, with still very fluctuating sugars, from 50-400, but better on her current dose of Lantus (50 units daily). She feel lethargic and believes this is from [place] >> will stop. - Reviewed HbA1c from last visit: 9.1% (improved, but still high) - I advised her to:  Patient Instructions  Please continue: - Lantus 50 units at bedtime - Humalog:  - 20 units before a smaller meal  - 26-30 units before a larger meal.  Please inject Humalog 15 min before a meal.  Please stop Invokana.  Please return in 1.5 months with your sugar log.   - continue checking sugars at different times of the day - check 3 times a day, rotating checks - advised for yearly eye exams >> she is UTD - given flu shot at last visit - Return to clinic in 1.5 mo with sugar log  Philemon Kingdom, MD PhD [facility] Endocrinology

## 2016-02-11 ENCOUNTER — Ambulatory Visit (INDEPENDENT_AMBULATORY_CARE_PROVIDER_SITE_OTHER): Payer: Medicare Other | Admitting: Internal Medicine

## 2016-02-11 ENCOUNTER — Encounter: Payer: Self-pay | Admitting: Internal Medicine

## 2016-02-11 VITALS — BP 140/82 | HR 80 | Wt 190.0 lb

## 2016-02-11 DIAGNOSIS — N183 Chronic kidney disease, stage 3 unspecified: Secondary | ICD-10-CM

## 2016-02-11 DIAGNOSIS — Z794 Long term (current) use of insulin: Secondary | ICD-10-CM

## 2016-02-11 DIAGNOSIS — E1122 Type 2 diabetes mellitus with diabetic chronic kidney disease: Secondary | ICD-10-CM | POA: Diagnosis not present

## 2016-02-11 LAB — POCT GLYCOSYLATED HEMOGLOBIN (HGB A1C): HEMOGLOBIN A1C: 9.5

## 2016-02-11 LAB — MICROALBUMIN / CREATININE URINE RATIO
CREATININE, U: 35.3 mg/dL
MICROALB UR: 11.3 mg/dL — AB (ref 0.0–1.9)
Microalb Creat Ratio: 32 mg/g — ABNORMAL HIGH (ref 0.0–30.0)

## 2016-02-11 NOTE — Patient Instructions (Signed)
Patient Instructions  Please split Lantus in 2 doses: - Lantus 30 units 2x a day  Continue: - Humalog:  - 20 units before a smaller meal  - 26-30 units before a larger meal.   Please inject Humalog 15 min before a meal.  Please return in 3 months with your sugar log. .Marland Kitchen

## 2016-02-11 NOTE — Progress Notes (Signed)
Patient ID: Yolanda Saunders, female   DOB: 08/19/1945, 71 y.o.   MRN: [id number]  HPI: Yolanda Saunders is a 71 y.o.-year-old female, returning for follow-up for DM2, dx 1999, insulin-dependent since ~2012, uncontrolled, with complications (CKD stage 3, h/o stroke post cardiac cath in 2002, macroalbuminuria). She saw Dr Buddy Duty in the past. Last visit with me 2.5 month ago.  She had low CBGs before last visit: 38-43 >> we decreased Lantus. She increased the dose back since last visit as she had more dietary indiscretions and sugars were higher. Feels tired, has HAs.  Last hemoglobin A1c was: Lab Results  Component Value Date   HGBA1C 9.1 11/01/2015   HGBA1C 11.0 08/07/2015   HGBA1C 10.3 05/07/2015  07/31/2014: 13.4%   Pt could not afford the analog insulin products >> we changed to:  Insulin Before breakfast Before lunch Before dinner  Regular 25 - smaller meal 30 - larger meal 25 - smaller meal 30 - larger meal 25 - smaller meal 30 - larger meal  NPH 40  30   She wanted to change back to Lantus and Humalog (She gained 15 lbs and has HAs), so she is now on: - Lantus 60 >> 50 >> 60 units at bedtime - Humalog:  - 20 units before a smaller meal  - 26-30 units before a larger meal.  Please inject Humalog 15 min before a meal. We stopped Invokana 100 mg before b'fast >> lethargy, fatigue - in 11/2015.  I suggested Bydureon 2 mg weekly - in donut hole >> could not start Tried Victoza >> helped, but AP She stopped Metformin ER 500 mg 2x a day because of AP, but does have IBS. She feels better after she stopped Tried Byetta >> N/V Tried Metformin >> AP, diarrhea, gas Tried SU >> upset stomach, nausea Has been on Invokana before >> yeast inf  She had elevated lipase (72) in 09/2011 while on Victoza.  Pt checks her sugars 1x a day and they are (per detailed log): - am: 160s-180s, 193 >> ave 188 >> 143-266 >> 46x1, 81-207, 242, 264 >> 62, 73-226 >> 62, 87, 154-202, 276, 290 - 2h after  b'fast: n/c >> 99-131 >> n/c >> 82-263 >> n/c >> 192, 290 - before lunch: 99 (sick), 126-211, 312, 422 >> 62, 120-211, 244, 338 >> 110, 136, 189 >> 130-227 - 2h after lunch: n/c >> 300-500 >> n/c >> 216-310 - before dinner: 210-280s >>  N/c >> 64 x1, 213-295 >> 86-232, 323 >> 65-102 >> 152-213, 302 - 2h after dinner: n/c >> 189-320 >> n/c >> 182-292 - bedtime: n/c >> 168-181 >> 190 >> 300s >> n/c >> 183-429 >> n/c >> 176-190 >> 187-241, 310 - nighttime: n/c >> 400 x1 >> 60, 66, 180 - lows whean watching grandchildren Lowest sugar was: 46 >> 38x1 >> 60.  she has hypoglycemia awareness at 60s.  Highest sugar was 590 >> 200s >> 213 >> 429 >> 338 >> 400 >> 300s.  Glucometer: Free Style  Pt's meals are: - Breakfast: oatmeal - Lunch: sandwich; chicken nuggets and fries - Dinner: heaviest: meat + 2 veggies + dessert later - Snacks: "too many" - cookies, sugary snacks  - + CKD, last BUN/creatinine:  Lab Results  Component Value Date   BUN 22 05/08/2015   CREATININE 1.15 05/08/2015  Had albuminuria in 04/2014 >300 Intolerant to ACEI and ARBs. - last set of lipids: Lab Results  Component Value Date   CHOL 169 05/08/2015  HDL 38.60 (L) 05/08/2015   LDLCALC 108 (H) 05/08/2015   TRIG 116.0 05/08/2015   CHOLHDL 4 05/08/2015  She is on Lipitor. - last eye exam was in 08/2015. No DR. Has cataracts.  - no numbness and tingling in her feet.  She is seeing Dr Narda Amber >> investigation for disequllibrium and word difficulty. She also has HTN, HL, anemia, GERD.  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  ROS: Constitutional: + weight gain, + fatigue, + subjective hypothermia, + nocturia Eyes: + blurry vision, no xerophthalmia ENT: + sore throat, no nodules palpated in throat, no dysphagia/odynophagia, no hoarseness Cardiovascular: no CP/SOB/palpitations/leg swelling Respiratory: no cough/no SOB Gastrointestinal: no N/V/D/+ C/no heartburn Musculoskeletal: no muscle/+ joint aches Skin:  no rashes, no hair loss Neurological: + tremors/numbness/tingling/dizziness, + HA  I reviewed pt's medications, allergies, PMH, social hx, family hx, and changes were documented in the history of present illness. Otherwise, unchanged from my initial visit note.  Past Medical History:  Diagnosis Date  . Anemia 04/17/11   "long, long years ago"  . Angina 04/16/11   "that's what I'm here for"  . Anxiety   . Asthma   . Carpal tunnel syndrome   . Cataracts, bilateral   . CKD (chronic kidney disease), stage III   . Complication of anesthesia 1987   "affected my eyes; couldn't see anything but blurrs even the next day"  . CVA (cerebral infarction)    After cardiac catheter 02/2000  . Depression   . Edema   . Fibromyalgia   . GERD (gastroesophageal reflux disease)   . Headache(784.0)   . High cholesterol   . History of bronchitis   . Hypertension   . IBS (irritable bowel syndrome)   . Migraines   . Panic attacks 04/17/11   "don't take anything for it"  . Pneumonia 04/17/11   "probably as many as 7 times"  . Renal disorder 04/17/11   "they are working at 60% capacity"  . Shortness of breath on exertion    "cause of my asthma"  . Stroke [facility]) 2002   residual "problem w/using the right word, left 5 lesions on my brain/MRI; long term memory loss"  . Type II diabetes mellitus ([place])    Past Surgical History:  Procedure Laterality Date  . CARDIAC CATHETERIZATION  2002  . CARPAL TUNNEL RELEASE  2003-2010   "twice on left; once on right"  . CHOLECYSTECTOMY  1987  . DEBRIDEMENT TENNIS ELBOW  2010  . VAGINAL HYSTERECTOMY  1977   Social History   Social History  . Marital Status: Married   Social History Main Topics  . Smoking status: Former Smoker -- 0.75 packs/day for 6 years    Types: Cigarettes    Quit date: 01/21/1980  . Smokeless tobacco: Never Used  . Alcohol Use: No  . Drug Use: No   Social History Narrative   Lives with husband in a one story home.  Has 2 children.   Retired from [employer].  Education: 12th grade.  Trade schools.    Current Outpatient Prescriptions on File Prior to Visit  Medication Sig Dispense Refill  . albuterol (PROVENTIL HFA;VENTOLIN HFA) 108 (90 BASE) MCG/ACT inhaler Inhale 2 puffs into the lungs every 6 (six) hours as needed. For shortness of breath    . aspirin EC 81 MG tablet Take 81 mg by mouth daily.    . Blood Glucose Monitoring Suppl (ONETOUCH VERIO) w/Device KIT 1 each by Does not apply route daily.  1 kit 0  . canagliflozin (INVOKANA) 100 MG TABS tablet Take 1 tablet (100 mg total) by mouth daily before breakfast. 30 tablet 5  . furosemide (LASIX) 20 MG tablet Take 20 mg by mouth 2 (two) times daily.     Marland Kitchen glucose blood (ONETOUCH VERIO) test strip Use as instructed to check sugar 3 times daily. 200 each 5  . insulin glargine (LANTUS) 100 UNIT/ML injection Inject 0.5 mLs (50 Units total) into the skin at bedtime. 20 mL 5  . insulin lispro (HUMALOG) 100 UNIT/ML injection Inject 0.2-0.3 mLs (20-30 Units total) into the skin 3 (three) times daily before meals. 20 mL 5  . Insulin Syringe-Needle U-100 (RELION INSULIN SYRINGE) 31G X 15/64" 1 ML MISC Use 3 times daily.. 300 each 2  . Lancets (ONETOUCH ULTRASOFT) lancets Use as instructed to check sugar 3 times daily. 200 each 3  . ranitidine (ZANTAC) 300 MG tablet Take 300 mg by mouth 2 (two) times daily.    . Syringe/Needle, Disp, (SYRINGE LUER LOCK) 23G X 1" 3 ML MISC 1 Package by Does not apply route as needed. 50 each 0  . Vitamin D, Cholecalciferol, 1000 UNITS TABS Take by mouth.     No current facility-administered medications on file prior to visit.    Allergies  Allergen Reactions  . Codeine Other (See Comments)    "makes me crazy; see things; delusional"  . Erythromycin Other (See Comments)    "peeled skin; like a sunburn & I turn the color of the pill; a kind of purple-look"  . Lisinopril Cough    "cause I have asthma"  . Penicillins Rash and Other (See Comments)     "puffy blisters  . Sulfonamide Derivatives Hives    "watery hives"  . Actos [Pioglitazone]     Upset GI  . Amlodipine     Syncope  . Benicar [Olmesartan] Other (See Comments)    Dizziness and HA  . Byetta 10 Mcg Pen [Exenatide] Nausea And Vomiting    5 MCG pen  . Glimepiride     Upset GI  . Glipizide     Upset GI  . Glyburide-Metformin     Myalgias  . Humalog [Insulin Lispro]     Headache  . Invokana [Canagliflozin]     Yeast infections  . Januvia [Sitagliptin]     uti  . Reglan [Metoclopramide]     unknown  . Septra [Sulfamethoxazole-Trimethoprim] Hives  . Spironolactone     unk  . Victoza [Liraglutide] Diarrhea    Abdominal pain   . Zocor [Simvastatin]     unknown  . Losartan Potassium Rash    Upset GI  . Novolin R [Insulin] Swelling and Rash    70/30   Family History  Problem Relation Age of Onset  . Coronary artery disease Father   . Diabetes Mellitus I Father   . CVA Mother   . Stroke Mother   . Diabetes Mellitus I Mother   . Diabetes Mellitus I Sister   . Diabetes Mellitus I Brother   . Diabetes Mellitus I Maternal Grandmother   . Diabetes Mellitus I Maternal Grandfather   . Diabetes Mellitus I Paternal Grandmother   . Diabetes Mellitus I Paternal Grandfather   . Diabetes Mellitus I Brother   . Aortic aneurysm Son    PE: BP 140/82 (BP Location: Left Arm, Patient Position: Sitting)   Pulse 80   Wt 190 lb (86.2 kg)   SpO2 97%   BMI 32.61 kg/m  Body mass index is 32.61 kg/m. Wt Readings from Last 3 Encounters:  02/11/16 190 lb (86.2 kg)  12/17/15 188 lb (85.3 kg)  11/01/15 191 lb (86.6 kg)   Constitutional: overweight, in NAD Eyes: PERRLA, EOMI, no exophthalmos ENT: moist mucous membranes, no thyromegaly, no cervical lymphadenopathy Cardiovascular: RRR, No MRG Respiratory: CTA B Gastrointestinal: abdomen soft, NT, ND, BS+ Musculoskeletal: no deformities, strength intact in all 4 Skin: moist, warm, no rashes; Neurological: no tremor  with outstretched hands, DTR normal in all 4  ASSESSMENT: 1. DM2, insulin-dependent, uncontrolled, with complications - CKD stage 3 - h/o stroke post cardiac cath in 2002 - macroalbuminuria - Lipoatrophy at the site of insulin injections  PLAN:  1. Patient with long-standing, uncontrolled diabetes, with still very fluctuating sugars, from 60-300. We had to stop Invokana at last visit b/c lethargy. As she is having occasional lows at night >> will try to split Lantus. - we cannot use a GLP1 R agonist 2/2 h/o elevated lipase on Victoza - Reviewed HbA1c from last visit: 9.1% (improved, but still high) >> today, higher, at 9.5% - I advised her to:   Patient Instructions  Please split Lantus in 2 doses: - Lantus 30 units 2x a day  Continue: - Humalog:  - 20 units before a smaller meal  - 26-30 units before a larger meal.   Please inject Humalog 15 min before a meal.  Please return in 3 months with your sugar log.   - continue checking sugars at different times of the day - check 3 times a day, rotating checks - advised for yearly eye exams >> she is UTD - given flu shot this season - will check a new ACR today as she has h/o elevated microalbumin - Return to clinic in 3 mo with sugar log  Component     Latest Ref Rng & Units 02/11/2016  Microalb, Ur     0.0 - 1.9 mg/dL 11.3 (H)  Creatinine,U     mg/dL 35.3  MICROALB/CREAT RATIO     0.0 - 30.0 mg/g 32.0 (H)   ACR much improved from before, although slightly elevated. This will likely improve with improvement of her diabetes.  Philemon Kingdom, MD PhD [facility] Endocrinology

## 2016-02-12 ENCOUNTER — Telehealth: Payer: Self-pay

## 2016-02-12 NOTE — Telephone Encounter (Signed)
Called and left message regarding lab work, advised of what the message from Dr.Gherghe said and gave call back number if any questions.

## 2016-02-12 NOTE — Telephone Encounter (Signed)
-----   Message from Carlus Pavlovristina Gherghe, MD sent at 02/11/2016  5:12 PM EST ----- Raynelle FanningJulie, can you please call pt: Her urinary proteins are much improved from before, although slightly elevated. This will likely improve with improvement of her diabetes.

## 2016-02-18 ENCOUNTER — Telehealth: Payer: Self-pay | Admitting: Internal Medicine

## 2016-02-18 NOTE — Telephone Encounter (Signed)
We split Lantus in 2 doses at last visit. Please advise her to go back to Lantus at bedtime 60 units.

## 2016-02-18 NOTE — Telephone Encounter (Signed)
Pt called with some of her sugar readings, she said these are the lowest/highest that it has gone since seeing the doctor last week.  1/24: 101 morning, 401 bedtime 1/25: 382 bedtime 1/26: 223 morning, 412 bedtime

## 2016-02-19 NOTE — Telephone Encounter (Signed)
Called patient and advised of changes to insulin. No questions at this time.

## 2016-05-12 ENCOUNTER — Telehealth: Payer: Self-pay

## 2016-05-12 ENCOUNTER — Encounter: Payer: Self-pay | Admitting: Internal Medicine

## 2016-05-12 ENCOUNTER — Ambulatory Visit: Payer: Medicare Other | Admitting: Internal Medicine

## 2016-05-12 ENCOUNTER — Ambulatory Visit (INDEPENDENT_AMBULATORY_CARE_PROVIDER_SITE_OTHER): Payer: Medicare Other | Admitting: Internal Medicine

## 2016-05-12 VITALS — BP 134/82 | HR 68 | Ht 63.0 in | Wt 199.0 lb

## 2016-05-12 DIAGNOSIS — E1122 Type 2 diabetes mellitus with diabetic chronic kidney disease: Secondary | ICD-10-CM

## 2016-05-12 DIAGNOSIS — N183 Chronic kidney disease, stage 3 unspecified: Secondary | ICD-10-CM

## 2016-05-12 DIAGNOSIS — Z794 Long term (current) use of insulin: Secondary | ICD-10-CM

## 2016-05-12 NOTE — Progress Notes (Signed)
Patient ID: Yolanda Saunders, female   DOB: [birth year], 71 y.o.   MRN: [id number]  HPI: Yolanda Saunders is a 71 y.o.-year-old female, returning for follow-up for DM2, dx 1999, insulin-dependent since ~2012, uncontrolled, with complications (CKD stage 3, h/o stroke post cardiac cath in 2002, macroalbuminuria). She saw Dr Buddy Duty in the past. Last visit with me 3 month ago.  She had low CBGs last fall: 38-43 >> we decreased Lantus. She increased the dose back since last visit as she had more dietary indiscretions and sugars were higher. We tried to split the dose at last visit >> sugars were higher >> we went back to the once a day Lantus. Sugars are still very variable.  Last hemoglobin A1c was: Lab Results  Component Value Date   HGBA1C 9.5 02/11/2016   HGBA1C 9.1 11/01/2015   HGBA1C 11.0 08/07/2015  07/31/2014: 13.4%   Pt could not afford the analog insulin products >> we changed to:  Insulin Before breakfast Before lunch Before dinner  Regular 25 - smaller meal 30 - larger meal 25 - smaller meal 30 - larger meal 25 - smaller meal 30 - larger meal  NPH 40  30   She wanted to change back to Lantus and Humalog: - Lantus 60 >> 50 >> 60 units at bedtime - Humalog: 30 units 3x a day We stopped Invokana 100 mg before b'fast >> lethargy, fatigue - in 11/2015.  I suggested Bydureon 2 mg weekly - in donut hole >> could not start Tried Victoza >> helped, but AP She stopped Metformin ER 500 mg 2x a day because of AP, but does have IBS. She feels better after she stopped Tried Byetta >> N/V Tried Metformin >> AP, diarrhea, gas Tried SU >> upset stomach, nausea Has been on Invokana before >> yeast inf She had elevated lipase (72) in 09/2011 while on Victoza.  Pt checks her sugars 1x a day and they are (per detailed log): - am:  46x1, 81-207, 242, 264 >> 62, 73-226 >> 62, 87, 154-202, 276, 290 >> 80, 90-271, Slightly better in last 2 weeks - 2h after b'fast: n/c >> 99-131 >> n/c >> 82-263 >>  n/c >> 192, 290 >> 67, 160 - before lunch:  62, 120-211, 244, 338 >> 110, 136, 189 >> 130-227 >> 65-174, 210 - 2h after lunch: n/c >> 300-500 >> n/c >> 216-310 >> 132-207 - before dinner:  64 x1, 213-295 >> 86-232, 323 >> 65-102 >> 152-213, 302 >> 76, 120-175, 256 - 2h after dinner: n/c >> 189-320 >> n/c >> 182-292 >> 240--278 (> 1 mo ago) - bedtime:n/c >> 183-429 >> n/c >> 176-190 >> 187-241, 310 >> 72, 210-341  - nighttime: n/c >> 400 x1 >> 60, 66, 180 - lows whean watching grandchildren Lowest sugar was: 46 >> 38x1 >> 60 >> 68.  she has hypoglycemia awareness at 60s.  Highest sugar was 590 >> 200s >> 213 >> 429 >> 338 >> 400 >> 300s >> 412 x1.   Glucometer: Free Style  Pt's meals are: - Breakfast: oatmeal - Lunch: sandwich; chicken nuggets and fries - Dinner: heaviest: meat + 2 veggies + dessert later - Snacks: "too many" - cookies, sugary snacks  - + CKD, last BUN/creatinine:  Lab Results  Component Value Date   BUN 22 05/08/2015   CREATININE 1.15 05/08/2015  Had albuminuria in 04/2014 >300 In 01/2016: Component     Latest Ref Rng & Units 02/11/2016  Microalb, Ur  0.0 - 1.9 mg/dL 11.3 (H)  Creatinine,U     mg/dL 35.3  MICROALB/CREAT RATIO     0.0 - 30.0 mg/g 32.0 (H)  ACR much improved from before, although slightly elevated. She saw a nephrologist in the past. Intolerant to ACEI and ARBs. - last set of lipids: Lab Results  Component Value Date   CHOL 169 05/08/2015   HDL 38.60 (L) 05/08/2015   LDLCALC 108 (H) 05/08/2015   TRIG 116.0 05/08/2015   CHOLHDL 4 05/08/2015  She is on Lipitor. - last eye exam was in 08/2015. No DR. Has cataracts.  - no numbness and tingling in her feet.  She is seeing Dr Narda Amber >> investigation for disequllibrium and word difficulty. She also has HTN, HL, anemia, GERD.  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  ROS:  Constitutional: + All: Weight gain, fatigue, nocturia Eyes: + blurry vision, no xerophthalmia ENT: no sore  throat, no nodules palpated in throat, no dysphagia/odynophagia, no hoarseness Cardiovascular: no CP/SOB/palpitations/leg swelling Respiratory: no cough/SOB Gastrointestinal: no N/V/D/+ C Musculoskeletal: no muscle/+joint aches Skin: no rashes Neurological: + tremors/no numbness/tingling/dizziness, headaches+  I reviewed pt's medications, allergies, PMH, social hx, family hx, and changes were documented in the history of present illness. Otherwise, unchanged from my initial visit note.  Past Medical History:  Diagnosis Date  . Anemia 04/17/11   "long, long years ago"  . Angina 04/16/11   "that's what I'm here for"  . Anxiety   . Asthma   . Carpal tunnel syndrome   . Cataracts, bilateral   . CKD (chronic kidney disease), stage III   . Complication of anesthesia 1987   "affected my eyes; couldn't see anything but blurrs even the next day"  . CVA (cerebral infarction)    After cardiac catheter 02/2000  . Depression   . Edema   . Fibromyalgia   . GERD (gastroesophageal reflux disease)   . Headache(784.0)   . High cholesterol   . History of bronchitis   . Hypertension   . IBS (irritable bowel syndrome)   . Migraines   . Panic attacks 04/17/11   "don't take anything for it"  . Pneumonia 04/17/11   "probably as many as 7 times"  . Renal disorder 04/17/11   "they are working at 60% capacity"  . Shortness of breath on exertion    "cause of my asthma"  . Stroke [facility]) 2002   residual "problem w/using the right word, left 5 lesions on my brain/MRI; long term memory loss"  . Type II diabetes mellitus ([place])    Past Surgical History:  Procedure Laterality Date  . CARDIAC CATHETERIZATION  2002  . CARPAL TUNNEL RELEASE  2003-2010   "twice on left; once on right"  . CHOLECYSTECTOMY  1987  . DEBRIDEMENT TENNIS ELBOW  2010  . VAGINAL HYSTERECTOMY  1977   Social History   Social History  . Marital Status: Married   Social History Main Topics  . Smoking status: Former Smoker -- 0.75  packs/day for 6 years    Types: Cigarettes    Quit date: 01/21/1980  . Smokeless tobacco: Never Used  . Alcohol Use: No  . Drug Use: No   Social History Narrative   Lives with husband in a one story home.  Has 2 children.  Retired from [employer].  Education: 12th grade.  Trade schools.    Current Outpatient Prescriptions on File Prior to Visit  Medication Sig Dispense Refill  . albuterol (PROVENTIL HFA;VENTOLIN  HFA) 108 (90 BASE) MCG/ACT inhaler Inhale 2 puffs into the lungs every 6 (six) hours as needed. For shortness of breath    . aspirin EC 81 MG tablet Take 81 mg by mouth daily.    Marland Kitchen atorvastatin (LIPITOR) 40 MG tablet     . Blood Glucose Monitoring Suppl (ONETOUCH VERIO) w/Device KIT 1 each by Does not apply route daily. 1 kit 0  . canagliflozin (INVOKANA) 100 MG TABS tablet Take 1 tablet (100 mg total) by mouth daily before breakfast. 30 tablet 5  . furosemide (LASIX) 20 MG tablet Take 20 mg by mouth 2 (two) times daily.     Marland Kitchen glucose blood (ONETOUCH VERIO) test strip Use as instructed to check sugar 3 times daily. 200 each 5  . insulin glargine (LANTUS) 100 UNIT/ML injection Inject 0.5 mLs (50 Units total) into the skin at bedtime. 20 mL 5  . insulin lispro (HUMALOG) 100 UNIT/ML injection Inject 0.2-0.3 mLs (20-30 Units total) into the skin 3 (three) times daily before meals. 20 mL 5  . Insulin Syringe-Needle U-100 (RELION INSULIN SYRINGE) 31G X 15/64" 1 ML MISC Use 3 times daily.. 300 each 2  . Lancets (ONETOUCH ULTRASOFT) lancets Use as instructed to check sugar 3 times daily. 200 each 3  . ranitidine (ZANTAC) 300 MG tablet Take 300 mg by mouth 2 (two) times daily.    . Syringe/Needle, Disp, (SYRINGE LUER LOCK) 23G X 1" 3 ML MISC 1 Package by Does not apply route as needed. 50 each 0  . Vitamin D, Cholecalciferol, 1000 UNITS TABS Take by mouth.     No current facility-administered medications on file prior to visit.    Allergies  Allergen Reactions  . Codeine Other (See  Comments)    "makes me crazy; see things; delusional"  . Erythromycin Other (See Comments)    "peeled skin; like a sunburn & I turn the color of the pill; a kind of purple-look"  . Lisinopril Cough    "cause I have asthma"  . Penicillins Rash and Other (See Comments)    "puffy blisters  . Sulfonamide Derivatives Hives    "watery hives"  . Actos [Pioglitazone]     Upset GI  . Amlodipine     Syncope  . Benicar [Olmesartan] Other (See Comments)    Dizziness and HA  . Byetta 10 Mcg Pen [Exenatide] Nausea And Vomiting    5 MCG pen  . Glimepiride     Upset GI  . Glipizide     Upset GI  . Glyburide-Metformin     Myalgias  . Humalog [Insulin Lispro]     Headache  . Invokana [Canagliflozin]     Yeast infections  . Januvia [Sitagliptin]     uti  . Reglan [Metoclopramide]     unknown  . Septra [Sulfamethoxazole-Trimethoprim] Hives  . Spironolactone     unk  . Victoza [Liraglutide] Diarrhea    Abdominal pain   . Zocor [Simvastatin]     unknown  . Losartan Potassium Rash    Upset GI  . Novolin R [Insulin] Swelling and Rash    70/30   Family History  Problem Relation Age of Onset  . Coronary artery disease Father   . Diabetes Mellitus I Father   . CVA Mother   . Stroke Mother   . Diabetes Mellitus I Mother   . Diabetes Mellitus I Sister   . Diabetes Mellitus I Brother   . Diabetes Mellitus I Maternal Grandmother   . Diabetes  Mellitus I Maternal Grandfather   . Diabetes Mellitus I Paternal Grandmother   . Diabetes Mellitus I Paternal Grandfather   . Diabetes Mellitus I Brother   . Aortic aneurysm Son    PE: BP 134/82 (BP Location: Left Arm, Patient Position: Sitting)   Pulse 68   Ht '5\' 3"'  (1.6 m)   Wt 199 lb (90.3 kg)   BMI 35.25 kg/m  Body mass index is 35.25 kg/m. Wt Readings from Last 3 Encounters:  05/12/16 199 lb (90.3 kg)  02/11/16 190 lb (86.2 kg)  12/17/15 188 lb (85.3 kg)   Constitutional: overweight, in NAD Eyes: PERRLA, EOMI, no  exophthalmos ENT: moist mucous membranes, no thyromegaly, no cervical lymphadenopathy Cardiovascular: RRR, No MRG Respiratory: CTA B Gastrointestinal: abdomen soft, NT, ND, BS+ Musculoskeletal: no deformities, strength intact in all 4 Skin: moist, warm, no rashes; Neurological: no tremor with outstretched hands, DTR normal in all 4  ASSESSMENT: 1. DM2, insulin-dependent, uncontrolled, with complications - CKD stage 3 - h/o stroke post cardiac cath in 2002 - macroalbuminuria - Lipoatrophy at the site of insulin injections  PLAN:  1. Patient with long-standing, uncontrolled diabetes, with still very fluctuating sugars, with slight improvement in the last 2 weeks. At last visit, we tried to split Lantus in 2 doses as she was having some lows at night, however, she did not do good with this and we had to go back to Lantus once a day. - She continues to have HAs and weight gain. She mentions that these started when she started insulin. However, we discussed today, that - we cannot use a GLP1 R agonist 2/2 h/o elevated lipase on Victoza; we tried [facility] and this caused dizziness, fatigue, and [place] infections. Unfortunately, we need to continue with insulin.  - As sugars after breakfast are lower, I will reduce the insulin with this meal. As sugars after dinner and in a.m. are higher, I will increase the insulin with this meal. We'll continue Lantus at the current dose. I also advised her that if she is active often assert the meal, she can decrease the insulin with the previous meal by 5-10 units.  - we also discussed at this visit that an insulin pump would be a much better option for her. She is open to this and I advised her to call her insurance to see which pumps are covered. Afterwards, we can schedule an appointment with the diabetes educator for prepump training. - Reviewed HbA1c from last visit:9.5% >> today, higher, at 9.7% - I advised her to:   Patient Instructions  Please decrease  b'fast insulin to 20-25 units. Continue lunch insulin at 30 units. Increase dinner insulin to 34-36 units.  If you plan to be active after a particular meal, then decrease the insulin with that meal by 5-10 units.  Continue Lantus 60 units at 12 am.  Check with your insurance if they cover a pump and which type.  Please come back for a follow-up appointment in 3 months.   - continue checking sugars at different times of the day - check 3 times a day, rotating checks - She is up-to-date with yearly eye exams - given flu shot this season - Return to clinic in 3 mo with sugar log  Philemon Kingdom, MD PhD [facility] Endocrinology

## 2016-05-12 NOTE — Telephone Encounter (Signed)
Called and LVM advising patient if we could change her appointment time. Left call back number if she could switch time slots.

## 2016-05-12 NOTE — Patient Instructions (Signed)
Please decrease b'fast insulin to 20-25 units. Continue lunch insulin at 30 units. Increase dinner insulin to 34-36 units.  If you plan to be active after a particular meal, then decrease the insulin with that meal by 5-10 units.  Continue Lantus 60 units at 12 am.  Check with your insurance if they cover a pump and which type.  Please come back for a follow-up appointment in 3 months.

## 2016-05-15 ENCOUNTER — Other Ambulatory Visit: Payer: Self-pay | Admitting: Internal Medicine

## 2016-06-25 IMAGING — DX DG LUMBAR SPINE COMPLETE 4+V
5 series · 5 of 5 positions shown · non-contrast
Comparison: CT on 10/18/2011

CLINICAL DATA: Low back injury and pain 2 days ago while lifting
granddaughter. Low back pain radiating to right leg. Initial
encounter.

EXAM:
LUMBAR SPINE - COMPLETE 4+ VIEW

[l-spine ap]
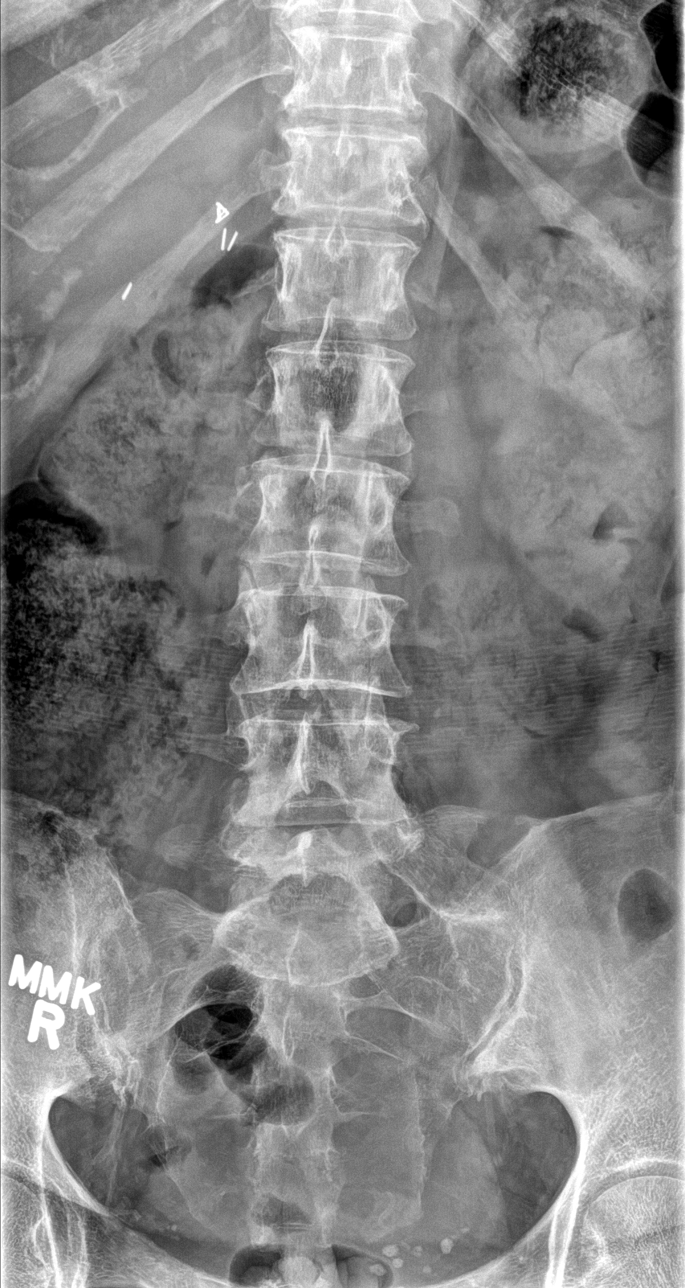

[l-spine obl (1 of 2)]
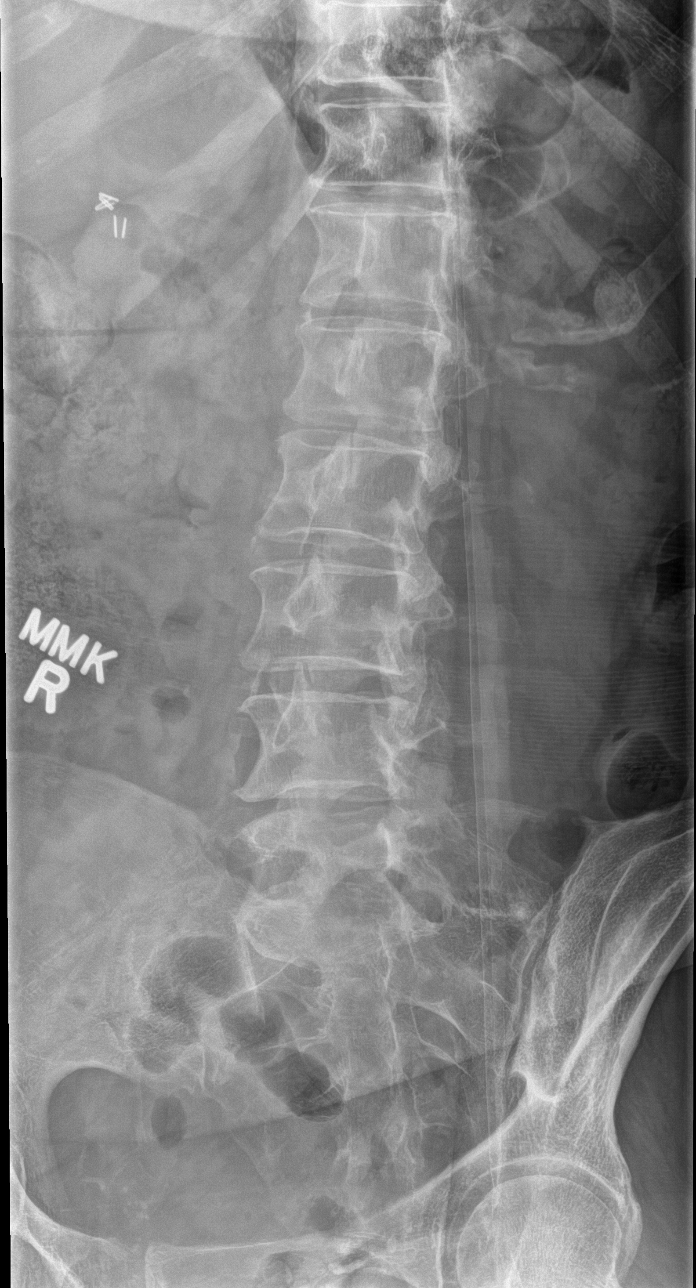

[l-spine obl (2 of 2)]
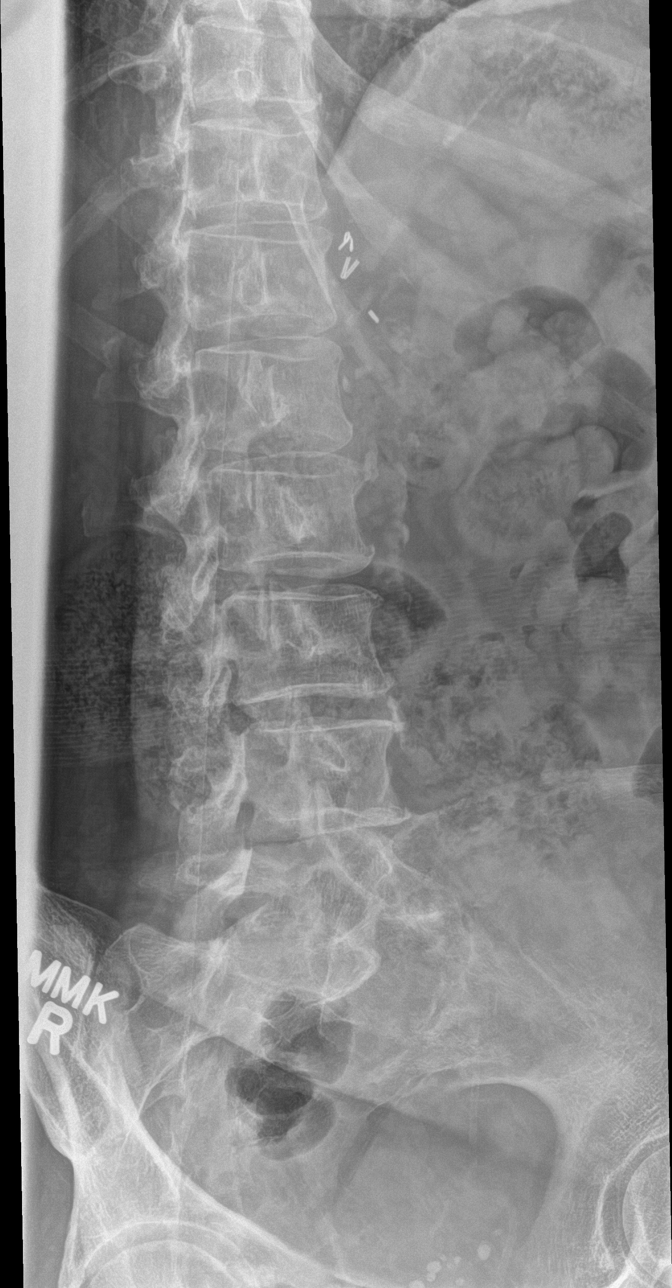

[l-spine lat]
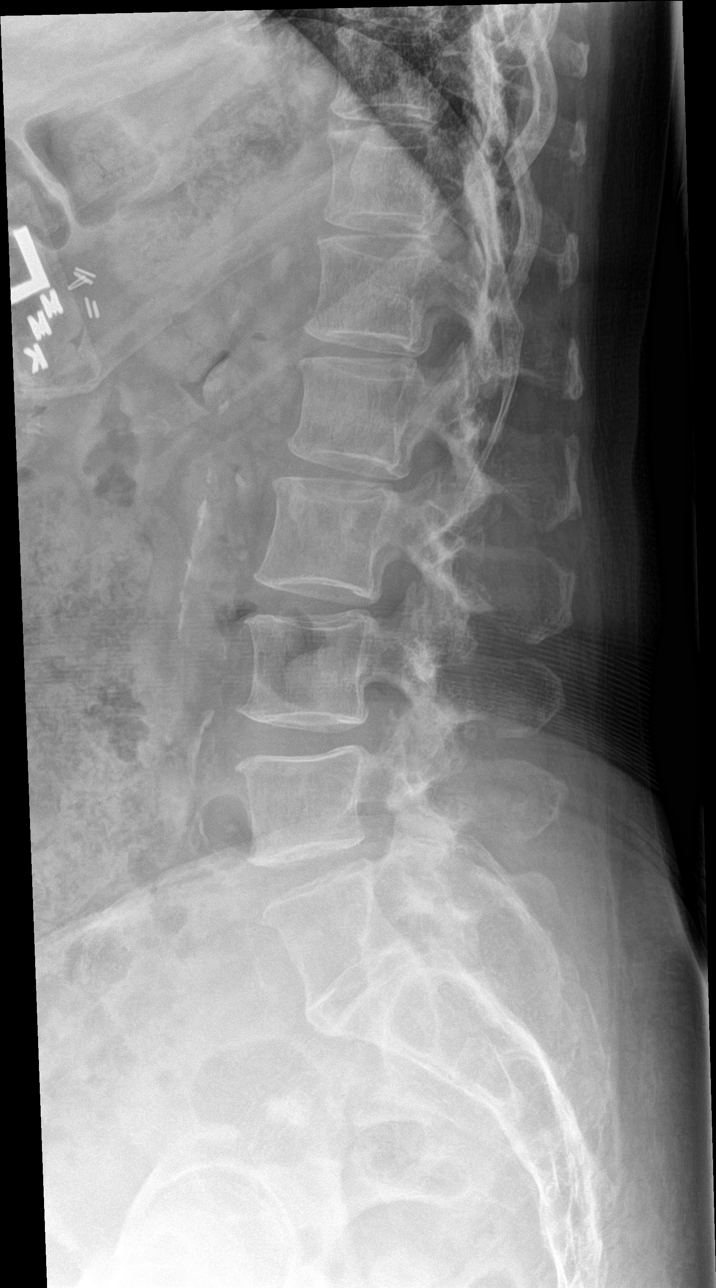

[l-spine spot]
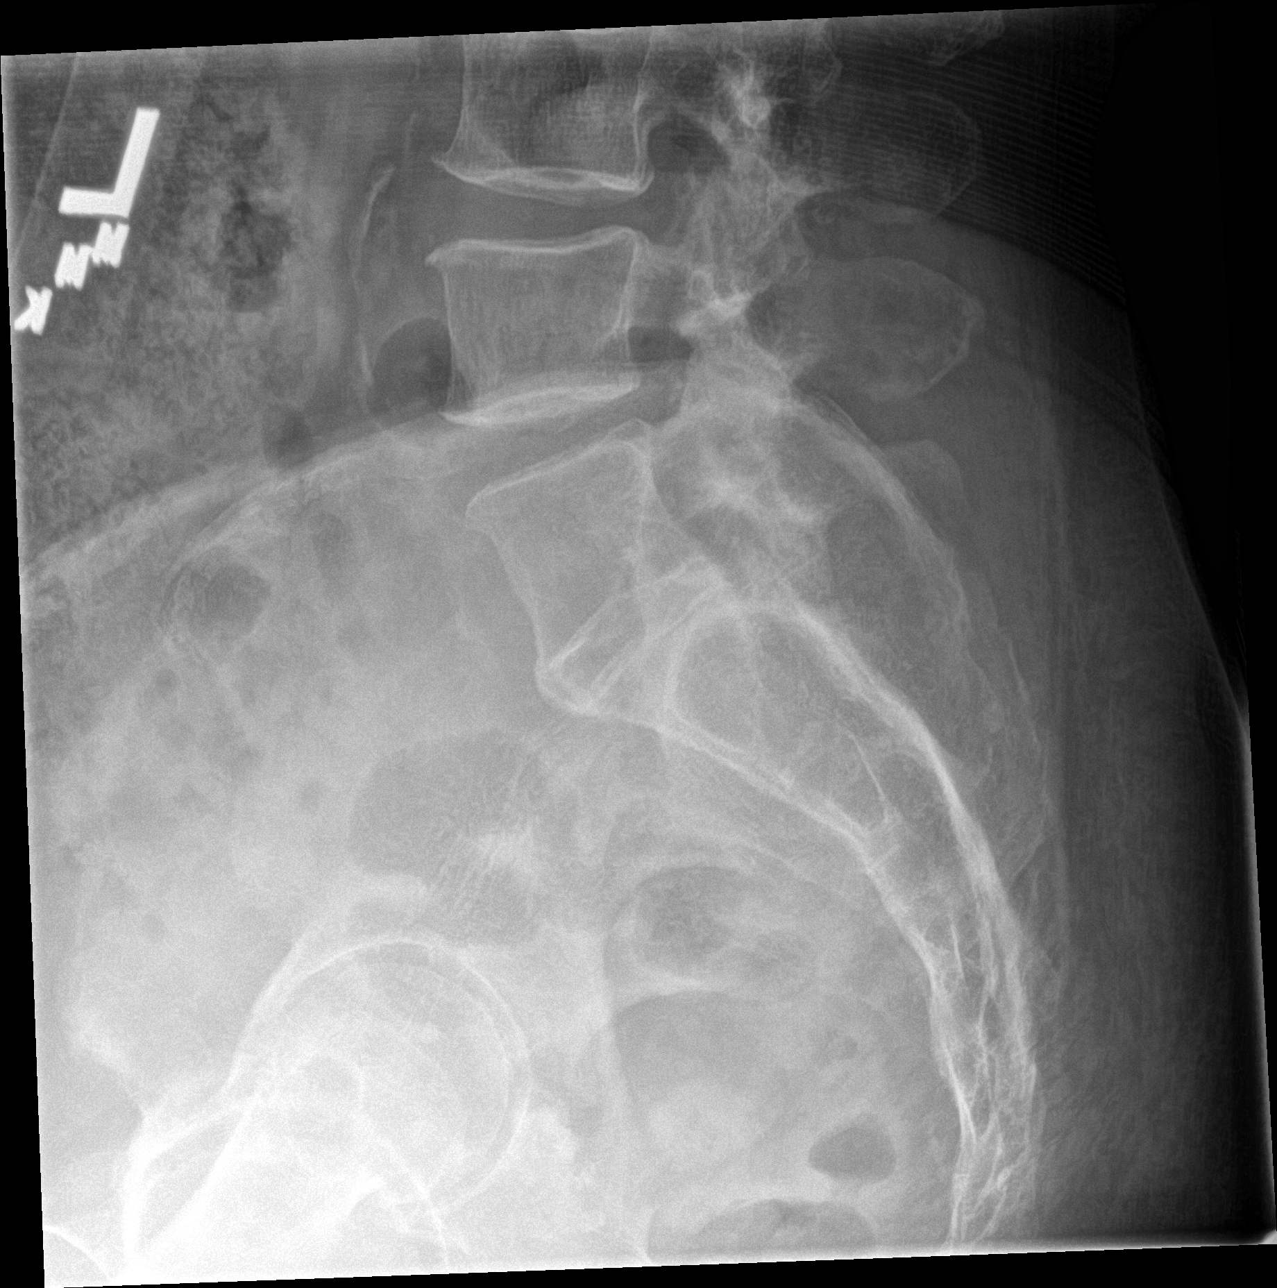

[5 of 5 positions shown; findings below may reference images not displayed]

FINDINGS: There is no evidence of lumbar spine fracture. Alignment is normal.
Intervertebral disc spaces are maintained. Mild lower lumbar facet
DJD seen bilaterally at L5-6. Transitional lumbosacral vertebra
noted which is labeled L6 for destructive purposes on this exam.
IMPRESSION: No acute findings.  Mild lower lumbar facet DJD.

## 2016-08-26 ENCOUNTER — Encounter: Payer: Self-pay | Admitting: Internal Medicine

## 2016-08-26 ENCOUNTER — Ambulatory Visit (INDEPENDENT_AMBULATORY_CARE_PROVIDER_SITE_OTHER): Payer: Medicare Other | Admitting: Internal Medicine

## 2016-08-26 VITALS — BP 134/70 | HR 77 | Wt 200.0 lb

## 2016-08-26 DIAGNOSIS — E669 Obesity, unspecified: Secondary | ICD-10-CM

## 2016-08-26 DIAGNOSIS — Z794 Long term (current) use of insulin: Secondary | ICD-10-CM

## 2016-08-26 DIAGNOSIS — E1122 Type 2 diabetes mellitus with diabetic chronic kidney disease: Secondary | ICD-10-CM | POA: Diagnosis not present

## 2016-08-26 DIAGNOSIS — N183 Chronic kidney disease, stage 3 unspecified: Secondary | ICD-10-CM

## 2016-08-26 DIAGNOSIS — Z6835 Body mass index (BMI) 35.0-35.9, adult: Secondary | ICD-10-CM

## 2016-08-26 DIAGNOSIS — IMO0001 Reserved for inherently not codable concepts without codable children: Secondary | ICD-10-CM

## 2016-08-26 LAB — POCT GLYCOSYLATED HEMOGLOBIN (HGB A1C): Hemoglobin A1C: 8.5

## 2016-08-26 NOTE — Addendum Note (Signed)
Addended by: Darene LamerHOMPSON, Kynzi Levay T on: 08/26/2016 03:38 PM   Modules accepted: Orders

## 2016-08-26 NOTE — Progress Notes (Addendum)
Patient ID: Yolanda Saunders, female   DOB: Sep 03, 1945, 71 y.o.   MRN: [id number]  HPI: Yolanda Saunders is a 71 y.o.-year-old female, returning for follow-up for DM2, dx 1999, insulin-dependent since ~2012, uncontrolled, with complications (CKD stage 3, h/o stroke post cardiac cath in 2002, macroalbuminuria). She saw Dr Buddy Duty in the past. Last visit with me 3.5 mo ago  She had low CBGs fall 2017: 38-43 >> we decreased Lantus. She increased the dose back since as sugars increased. We tried to split the dose >> sugars were higher >> we went back to the once a day Lantus. Sugars continue to be very variable.  Last hemoglobin A1c was: Lab Results  Component Value Date   HGBA1C 9.5 02/11/2016   HGBA1C 9.1 11/01/2015   HGBA1C 11.0 08/07/2015  07/31/2014: 13.4%   Pt could not afford the analog insulin products >> we changed to:  Insulin Before breakfast Before lunch Before dinner  Regular 25 - smaller meal 30 - larger meal 25 - smaller meal 30 - larger meal 25 - smaller meal 30 - larger meal  NPH 40  30   We then changed back to Lantus and Humalog: - Lantus 60 units at night - Humalog  - not following the instructions I gave her last time:  b'fast 20-25 units  >> 30  lunch 30 units.  dinner 34-36 units >> 30 If you plan to be active after a particular meal, then decrease the insulin with that meal by 5-10 units. We stopped Invokana 100 mg before b'fast >> lethargy, fatigue - in 11/2015. I suggested Bydureon 2 mg weekly - in donut hole >> could not start Tried Victoza >> helped, but AP She stopped Metformin ER 500 mg 2x a day because of AP, but does have IBS. She feels better after she stopped Tried Byetta >> N/V Tried Metformin >> AP, diarrhea, gas Tried SU >> upset stomach, nausea Has been on Invokana before >> yeast inf She had elevated lipase (72) in 09/2011 while on Victoza.  Pt checks her sugars 2x a day (per log): - am:   62, 87, 154-202, 276, 290 >> 80, 90-271 >> 57,  124-244 - 2h after b'fast: 82-263 >> n/c >> 192, 290 >> 67, 160 >> 258 - before lunch:110, 136, 189 >> 130-227 >> 65-174, 210 >> 85-191 - 2h after lunch: n/c >> 216-310 >> 132-207 >> 66, 174-301 - before dinner:  152-213, 302 >> 76, 120-175, 256 >> 101-171 - 2h after dinner: 182-292 >> 240--278 (> 1 mo ago) >> 216-427 (icecream) - bedtime:187-241, 310 >> 72, 210-341  >> n/c - nighttime:  400 x1 >> 60, 66, 180 - lows whean watching grandchildren >> 394 (skipped insulin) Lowest sugar was: 68 >> 57.  she has hypoglycemia awareness at 60s.   Highest sugar was 412 x1 >> 427   Glucometer: Free Style  Pt's meals are: - Breakfast: oatmeal - Lunch: sandwich; chicken nuggets and fries - Dinner: heaviest: meat + 2 veggies + dessert later - Snacks: "too many" - cookies, sugary snacks  - she has CKD, last BUN/creatinine: 07/09/2016: Any 1/1.22, glucose 183, GFR 43 Lab Results  Component Value Date   BUN 22 05/08/2015   CREATININE 1.15 05/08/2015  Had albuminuria in 04/2014 >300 In 01/2016: Component     Latest Ref Rng & Units 02/11/2016  Microalb, Ur     0.0 - 1.9 mg/dL 11.3 (H)  Creatinine,U     mg/dL 35.3  MICROALB/CREAT RATIO  0.0 - 30.0 mg/g 32.0 (H)  She saw a nephrologist in the past. Intolerant to ACEI and ARBs. - last set of lipids: 07/09/2016: 175/122/36/150 Lab Results  Component Value Date   CHOL 169 05/08/2015   HDL 38.60 (L) 05/08/2015   LDLCALC 108 (H) 05/08/2015   TRIG 116.0 05/08/2015   CHOLHDL 4 05/08/2015  On Lipitor. - last eye exam was in 06/2016 >> +? DR. Has cataracts.  - denies numbness and tingling in her feet.  07/09/2016, TSH normal, at 3.92.  She is seeing Dr Donika Patel >> investigation for disequllibrium and word difficulty. She also has HTN, HL, anemia, GERD.  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 ROS:  Constitutional: no weight gain/no weight loss, no fatigue, no subjective hyperthermia, no subjective hypothermia Eyes: no blurry vision,  no xerophthalmia ENT: no sore throat, no nodules palpated in throat, no dysphagia, no odynophagia, no hoarseness Cardiovascular: no CP/no SOB/no palpitations/+ leg swelling Respiratory: no cough/no SOB/no wheezing Gastrointestinal: no N/no V/no D/no C/no acid reflux Musculoskeletal: no muscle aches/no joint aches Skin: no rashes, no hair loss Neurological: no tremors/no numbness/no tingling/no dizziness  I reviewed pt's medications, allergies, PMH, social hx, family hx, and changes were documented in the history of present illness. Otherwise, unchanged from my initial visit note.  Past Medical History:  Diagnosis Date  . Anemia 04/17/11   "long, long years ago"  . Angina 04/16/11   "that's what I'm here for"  . Anxiety   . Asthma   . Carpal tunnel syndrome   . Cataracts, bilateral   . CKD (chronic kidney disease), stage III   . Complication of anesthesia 1987   "affected my eyes; couldn't see anything but blurrs even the next day"  . CVA (cerebral infarction)    After cardiac catheter 02/2000  . Depression   . Edema   . Fibromyalgia   . GERD (gastroesophageal reflux disease)   . Headache(784.0)   . High cholesterol   . History of bronchitis   . Hypertension   . IBS (irritable bowel syndrome)   . Migraines   . Panic attacks 04/17/11   "don't take anything for it"  . Pneumonia 04/17/11   "probably as many as 7 times"  . Renal disorder 04/17/11   "they are working at 60% capacity"  . Shortness of breath on exertion    "cause of my asthma"  . Stroke (HCC) 2002   residual "problem w/using the right word, left 5 lesions on my brain/MRI; long term memory loss"  . Type II diabetes mellitus (HCC)    Past Surgical History:  Procedure Laterality Date  . CARDIAC CATHETERIZATION  2002  . CARPAL TUNNEL RELEASE  2003-2010   "twice on left; once on right"  . CHOLECYSTECTOMY  1987  . DEBRIDEMENT TENNIS ELBOW  2010  . VAGINAL HYSTERECTOMY  1977   Social History   Social History  .  Marital Status: Married   Social History Main Topics  . Smoking status: Former Smoker -- 0.75 packs/day for 6 years    Types: Cigarettes    Quit date: 01/21/1980  . Smokeless tobacco: Never Used  . Alcohol Use: No  . Drug Use: No   Social History Narrative   Lives with husband in a one story home.  Has 2 children.  Retired from teaching music.  Education: 12th grade.  [employer].    Current Outpatient Prescriptions on File Prior to Visit  Medication Sig Dispense Refill  . albuterol (PROVENTIL   HFA;VENTOLIN HFA) 108 (90 BASE) MCG/ACT inhaler Inhale 2 puffs into the lungs every 6 (six) hours as needed. For shortness of breath    . aspirin EC 81 MG tablet Take 81 mg by mouth daily.    . atorvastatin (LIPITOR) 40 MG tablet     . Blood Glucose Monitoring Suppl (ONETOUCH VERIO) w/Device KIT 1 each by Does not apply route daily. 1 kit 0  . canagliflozin (INVOKANA) 100 MG TABS tablet Take 1 tablet (100 mg total) by mouth daily before breakfast. 30 tablet 5  . furosemide (LASIX) 20 MG tablet Take 20 mg by mouth 2 (two) times daily.     . glucose blood (ONETOUCH VERIO) test strip Use as instructed to check sugar 3 times daily. 200 each 5  . HUMALOG 100 UNIT/ML injection INJECT 20 TO 30 UNITS SUBCUTANEOUSLY 3 TIMES DAILY BEFORE MEALS 20 mL 5  . insulin glargine (LANTUS) 100 UNIT/ML injection Inject 0.5 mLs (50 Units total) into the skin at bedtime. 20 mL 5  . Insulin Syringe-Needle U-100 (RELION INSULIN SYRINGE) 31G X 15/64" 1 ML MISC Use 3 times daily.. 300 each 2  . Lancets (ONETOUCH ULTRASOFT) lancets Use as instructed to check sugar 3 times daily. 200 each 3  . LANTUS 100 UNIT/ML injection INJECT 60 UNITS SUBCUTANEOUSLY ONCE DAILY AT BEDTIME 20 mL 5  . ranitidine (ZANTAC) 300 MG tablet Take 300 mg by mouth 2 (two) times daily.    . Syringe/Needle, Disp, (SYRINGE LUER LOCK) 23G X 1" 3 ML MISC 1 Package by Does not apply route as needed. 50 each 0  . Vitamin D, Cholecalciferol, 1000 UNITS TABS  Take by mouth.     No current facility-administered medications on file prior to visit.    Allergies  Allergen Reactions  . Codeine Other (See Comments)    "makes me crazy; see things; delusional"  . Erythromycin Other (See Comments)    "peeled skin; like a sunburn & I turn the color of the pill; a kind of purple-look"  . Lisinopril Cough    "cause I have asthma"  . Penicillins Rash and Other (See Comments)    "puffy blisters  . Sulfonamide Derivatives Hives    "watery hives"  . Actos [Pioglitazone]     Upset GI  . Amlodipine     Syncope  . Benicar [Olmesartan] Other (See Comments)    Dizziness and HA  . Byetta 10 Mcg Pen [Exenatide] Nausea And Vomiting    5 MCG pen  . Glimepiride     Upset GI  . Glipizide     Upset GI  . Glyburide-Metformin     Myalgias  . Humalog [Insulin Lispro]     Headache  . Invokana [Canagliflozin]     Yeast infections  . Januvia [Sitagliptin]     uti  . Reglan [Metoclopramide]     unknown  . Septra [Sulfamethoxazole-Trimethoprim] Hives  . Spironolactone     unk  . Victoza [Liraglutide] Diarrhea    Abdominal pain   . Zocor [Simvastatin]     unknown  . Losartan Potassium Rash    Upset GI  . Novolin R [Insulin] Swelling and Rash    70/30   Family History  Problem Relation Age of Onset  . Coronary artery disease Father   . Diabetes Mellitus I Father   . CVA Mother   . Stroke Mother   . Diabetes Mellitus I Mother   . Diabetes Mellitus I Sister   . Diabetes Mellitus I Brother   .   Diabetes Mellitus I Maternal Grandmother   . Diabetes Mellitus I Maternal Grandfather   . Diabetes Mellitus I Paternal Grandmother   . Diabetes Mellitus I Paternal Grandfather   . Diabetes Mellitus I Brother   . Aortic aneurysm Son    PE: BP 134/70 (BP Location: Left Arm, Patient Position: Sitting)   Pulse 77   Wt 200 lb (90.7 kg)   SpO2 96%   BMI 35.43 kg/m   Body mass index is 35.43 kg/m. Wt Readings from Last 3 Encounters:  08/26/16 200 lb  (90.7 kg)  05/12/16 199 lb (90.3 kg)  02/11/16 190 lb (86.2 kg)   Constitutional: overweight, in NAD Eyes: PERRLA, EOMI, no exophthalmos ENT: moist mucous membranes, no thyromegaly, no cervical lymphadenopathy Cardiovascular: RRR, No MRG, + LE swelling  - pitting B Respiratory: CTA B Gastrointestinal: abdomen soft, NT, ND, BS+ Musculoskeletal: no deformities, strength intact in all 4 Skin: moist, warm, no rashes Neurological: no tremor with outstretched hands, DTR normal in all 4  ASSESSMENT: 1. DM2, insulin-dependent, uncontrolled, with complications - CKD stage 3 - h/o stroke post cardiac cath in 2002 - macroalbuminuria >> improved - Lipoatrophy at the site of insulin injections  2. CKD   3. Obesity class 2 BMI Classification:  < 18.5 underweight   18.5-24.9 normal weight   25.0-29.9 overweight   30.0-34.9 class I obesity   35.0-39.9 class II obesity   ? 40.0 class III obesity   PLAN:  1. Patient with long-standing, uncontrolled DM, with still very fluctuating sugars uncontrolled diabetes, with still very fluctuating sugars, with some improvement compared with last visit. She changed the doses of Humalog >> sugars are higher and she continues to have some lows, also >> we discussed about decreasing am insulin and increasing dinner insulin again - we cannot use a GLP1 R agonist 2/2 h/o elevated lipase on Victoza; we tried iNVOKANA and this caused dizziness, fatigue, and East infections. Unfortunately, we need to continue with insulin.  - will refer her to nutrition per her requeat - I advised her to:   Patient Instructions  Please continue; - Lantus 60 units at night - Humalog:  b'fast 30 >> 20-25 units.  lunch 30 units.  dinner 30 >> 34-36 units. If you plan to be active after a particular meal, then decrease the insulin with that meal by 5-10 units.  Please schedule an appt with Laura Jobe with nutrition.  Please come back for a follow-up appointment in 3  months.   - today, HbA1c is 8.5% (lower) - continue checking sugars at different times of the day - check 3x a day, rotating checks - advised for yearly eye exams >> she is UTD - Return to clinic in 3 mo with sugar   2. CKD - with albuminuria - improving per last ACR  3. Obesity class 2 - worse - referred to nutrition  Cristina Gherghe, MD PhD [facility]  

## 2016-08-26 NOTE — Patient Instructions (Addendum)
Please continue; - Lantus 60 units at night - Humalog:  b'fast 30 >> 20-25 units.  lunch 30 units.  dinner 30 >> 34-36 units. If you plan to be active after a particular meal, then decrease the insulin with that meal by 5-10 units.  Please schedule an appt with Oran ReinLaura Jobe with nutrition.  Please come back for a follow-up appointment in 3 months.

## 2016-09-03 ENCOUNTER — Telehealth: Payer: Self-pay | Admitting: *Deleted

## 2016-09-03 NOTE — Telephone Encounter (Signed)
NOTES SENT TO SCHEDULING.  °

## 2016-09-12 ENCOUNTER — Encounter: Payer: Self-pay | Admitting: Dietician

## 2016-09-12 ENCOUNTER — Encounter: Payer: Medicare Other | Attending: Family Medicine | Admitting: Dietician

## 2016-09-12 DIAGNOSIS — E1122 Type 2 diabetes mellitus with diabetic chronic kidney disease: Secondary | ICD-10-CM | POA: Insufficient documentation

## 2016-09-12 DIAGNOSIS — Z713 Dietary counseling and surveillance: Secondary | ICD-10-CM | POA: Insufficient documentation

## 2016-09-12 DIAGNOSIS — N183 Chronic kidney disease, stage 3 unspecified: Secondary | ICD-10-CM

## 2016-09-12 DIAGNOSIS — Z794 Long term (current) use of insulin: Secondary | ICD-10-CM | POA: Insufficient documentation

## 2016-09-12 NOTE — Patient Instructions (Signed)
Consider armchair exercises most days. Choose mindfully, eat slowly, stop when you are satisfied. Consider eating away from distractions.   Plan:  Aim for 2-3 Carb Choices per meal (30-45 grams) +/- 1 either way  Aim for 0-1 Carbs per snack if hungry  Include protein in moderation with your meals and snacks Consider reading food labels for Total Carbohydrate and Fat Grams of foods Continue checking BG at alternate times per day as directed by MD  Continue taking medication as directed by MD

## 2016-09-12 NOTE — Progress Notes (Signed)
Diabetes Self-Management Education  Visit Type: First/Initial  Appt. Start Time: 1000 Appt. End Time: 1120  09/12/2016  [name], identified by name and date of birth, is a 71 y.o. female with a diagnosis of Diabetes: Type 2. Other hx includes HTN, hyperlipidemia, GERD, CKD stage 3, CVA, fibromyalgia, sleeping problems.  She is checking her blood sugar 4 times per day.  Readings are generally above 180.  She is frustrated over her weight as she has gained 50 lbs since 2012 when she started on insulin.  She stays up into the early morning and eats.  "always hungry".  Medications include:  Lantus 60 units each HS, Humalog 20-30 units with each meal depending on meal size and blood sugar reading.  She tends to eat light in the morning and continue to eat more and more as the day progresses.  She states that her portions are too large especially at dinner.  She forgets her meal time insulin at times.  Patient lives with her husband who also has diabetes.  His personality has changed some since developing this and he can be difficult to care for at times.  Patient states that she sometimes forgets to do things for herself due to her husband's needs.  They share shopping and she does the cooking.  They eat out at times.    ASSESSMENT  Height 5' 3.5" (1.613 m), weight 204 lb (92.5 kg). Body mass index is 35.57 kg/m.      Diabetes Self-Management Education - 09/12/16 1013      Visit Information   Visit Type First/Initial     Initial Visit   Diabetes Type Type 2   Are you currently following a meal plan? No   Are you taking your medications as prescribed? Yes   Date Diagnosed 1999  Insulin since 2012     Health Coping   How would you rate your overall health? Poor     Psychosocial Assessment   Patient Belief/Attitude about Diabetes Defeat/Burnout   Self-care barriers Hard of hearing  memory issues, fibromyalgia (chronic pain)   Self-management support Doctor's office;Family    Other persons present Patient   Patient Concerns Nutrition/Meal planning;Glycemic Control;Weight Control   Special Needs None   Preferred Learning Style No preference indicated   Learning Readiness Ready   How often do you need to have someone help you when you read instructions, pamphlets, or other written materials from your doctor or pharmacy? 1 - Never   What is the last grade level you completed in school? technical college     Pre-Education Assessment   Patient understands the diabetes disease and treatment process. Demonstrates understanding / competency   Patient understands incorporating nutritional management into lifestyle. Needs Review   Patient undertands incorporating physical activity into lifestyle. Needs Review   Patient understands using medications safely. Needs Review   Patient understands monitoring blood glucose, interpreting and using results Needs Review   Patient understands prevention, detection, and treatment of acute complications. Demonstrates understanding / competency   Patient understands prevention, detection, and treatment of chronic complications. Demonstrates understanding / competency   Patient understands how to develop strategies to address psychosocial issues. Needs Review   Patient understands how to develop strategies to promote health/change behavior. Needs Review     Complications   Last HgB A1C per patient/outside source 8.5 %  08/26/16   How often do you check your blood sugar? 3-4 times/day   Fasting Blood glucose range (mg/dL) 916-606   Postprandial  Blood glucose range (mg/dL) >161;096-045   Number of hypoglycemic episodes per month 1   Can you tell when your blood sugar is low? Yes   What do you do if your blood sugar is low? drinks juice   Number of hyperglycemic episodes per week 2   Can you tell when your blood sugar is high? Yes   What do you do if your blood sugar is high? walks, chair exercises, drinks water   Have you had a  dilated eye exam in the past 12 months? Yes   Have you had a dental exam in the past 12 months? Yes   Are you checking your feet? Yes   How many days per week are you checking your feet? 7     Dietary Intake   Breakfast Bran cereal, 2% milk OR tomato sandwich on [employer] bread, coffee with low fat splenda creamer   Snack (morning) none   Lunch sandwich on [employer] OR out to eat chicken salad on 2 slices toast OR fruit   Snack (afternoon) none   Dinner meat, 2 vegetables, bread (cornbread, biscuits, [employer] bread) or mashed potatoes   Snack (evening) "junk food" pound cake, cheetos, bugles   Beverage(s) 6 bottles water per day, coffee with low fat splenda creamer, unsweetened tea     Exercise   Exercise Type ADL's  problems with mobility, weak muscles, sciatica, legs tremble and shake and loses balance.   How many days per week to you exercise? 0   How many minutes per day do you exercise? 0   Total minutes per week of exercise 0     Patient Education   Previous Diabetes Education Yes (please comment)  yes but cannot remember information post stroke   Disease state  Other (comment)  reviewed   Nutrition management  Role of diet in the treatment of diabetes and the relationship between the three main macronutrients and blood glucose level;Food label reading, portion sizes and measuring food.;Carbohydrate counting;Meal options for control of blood glucose level and chronic complications.;Meal timing in regards to the patients' current diabetes medication.   Physical activity and exercise  Role of exercise on diabetes management, blood pressure control and cardiac health.;Helped patient identify appropriate exercises in relation to his/her diabetes, diabetes complications and other health issue.   Medications Reviewed patients medication for diabetes, action, purpose, timing of dose and side effects.   Monitoring Purpose and frequency of SMBG.;Identified appropriate SMBG and/or A1C goals.;Other (comment)   provided basic information on Freestyle Libre   Psychosocial adjustment Worked with patient to identify barriers to care and solutions;Role of stress on diabetes;Identified and addressed patients feelings and concerns about diabetes     Individualized Goals (developed by patient)   Nutrition Follow meal plan discussed   Physical Activity Exercise 3-5 times per week;15 minutes per day   Medications take my medication as prescribed   Monitoring  test my blood glucose as discussed   Reducing Risk examine blood glucose patterns;do foot checks daily   Health Coping discuss diabetes with (comment)  MD,RD,CDE     Post-Education Assessment   Patient understands the diabetes disease and treatment process. Demonstrates understanding / competency   Patient understands incorporating nutritional management into lifestyle. Demonstrates understanding / competency   Patient undertands incorporating physical activity into lifestyle. Demonstrates understanding / competency   Patient understands using medications safely. Demonstrates understanding / competency   Patient understands monitoring blood glucose, interpreting and using results Demonstrates understanding / competency   Patient  understands prevention, detection, and treatment of acute complications. Demonstrates understanding / competency   Patient understands prevention, detection, and treatment of chronic complications. Demonstrates understanding / competency   Patient understands how to develop strategies to address psychosocial issues. Demonstrates understanding / competency   Patient understands how to develop strategies to promote health/change behavior. Demonstrates understanding / competency     Outcomes   Expected Outcomes Demonstrated interest in learning. Expect positive outcomes   Future DMSE PRN   Program Status Completed      Individualized Plan for Diabetes Self-Management Training:   Learning Objective:  Patient will have a  greater understanding of diabetes self-management. Patient education plan is to attend individual and/or group sessions per assessed needs and concerns.   Plan:   Patient Instructions  Consider armchair exercises most days. Choose mindfully, eat slowly, stop when you are satisfied. Consider eating away from distractions.   Plan:  Aim for 2-3 Carb Choices per meal (30-45 grams) +/- 1 either way  Aim for 0-1 Carbs per snack if hungry  Include protein in moderation with your meals and snacks Consider reading food labels for Total Carbohydrate and Fat Grams of foods Continue checking BG at alternate times per day as directed by MD  Continue taking medication as directed by MD      Expected Outcomes:  Demonstrated interest in learning. Expect positive outcomes  Education material provided: Living Well with Diabetes, Food label handouts, A1C conversion sheet, Meal plan card, My Plate and Snack sheet, Planning healthy meals  If problems or questions, patient to contact team via:  Phone  Future DSME appointment: PRN

## 2016-10-15 ENCOUNTER — Encounter: Payer: Self-pay | Admitting: Cardiology

## 2016-10-15 ENCOUNTER — Ambulatory Visit (INDEPENDENT_AMBULATORY_CARE_PROVIDER_SITE_OTHER): Payer: Medicare Other | Admitting: Cardiology

## 2016-10-15 VITALS — BP 124/74 | HR 76 | Ht 63.5 in | Wt 195.4 lb

## 2016-10-15 DIAGNOSIS — N183 Chronic kidney disease, stage 3 unspecified: Secondary | ICD-10-CM

## 2016-10-15 DIAGNOSIS — E1122 Type 2 diabetes mellitus with diabetic chronic kidney disease: Secondary | ICD-10-CM

## 2016-10-15 DIAGNOSIS — I10 Essential (primary) hypertension: Secondary | ICD-10-CM

## 2016-10-15 DIAGNOSIS — R55 Syncope and collapse: Secondary | ICD-10-CM

## 2016-10-15 DIAGNOSIS — Z794 Long term (current) use of insulin: Secondary | ICD-10-CM

## 2016-10-15 NOTE — Progress Notes (Deleted)
Cardiology Office Note:    Date:  10/15/2016   ID:  Yolanda Saunders, DOB 19-May-1945, MRN [id number]  PCP:  Shirline Frees, MD  Cardiologist:  Fransico Him, MD   Referring MD: Shirline Frees, MD   No chief complaint on file.   History of Present Illness:    Yolanda Saunders is a 71 y.o. female with a hx of ***  Past Medical History:  Diagnosis Date  . Anemia 04/17/11   "long, long years ago"  . Angina 04/16/11   "that's what I'm here for"  . Anxiety   . Asthma   . Carpal tunnel syndrome   . Cataracts, bilateral   . CKD (chronic kidney disease), stage III   . Complication of anesthesia 1987   "affected my eyes; couldn't see anything but blurrs even the next day"  . CVA (cerebral infarction)    After cardiac catheter 02/2000  . Depression   . Edema   . Fibromyalgia   . GERD (gastroesophageal reflux disease)   . Headache(784.0)   . High cholesterol   . History of bronchitis   . Hypertension   . IBS (irritable bowel syndrome)   . Migraines   . Panic attacks 04/17/11   "don't take anything for it"  . Pneumonia 04/17/11   "probably as many as 7 times"  . Renal disorder 04/17/11   "they are working at 60% capacity"  . Shortness of breath on exertion    "cause of my asthma"  . Stroke [facility]) 2002   residual "problem w/using the right word, left 5 lesions on my brain/MRI; long term memory loss"  . Type II diabetes mellitus ([place])     Past Surgical History:  Procedure Laterality Date  . CARDIAC CATHETERIZATION  2002  . CARPAL TUNNEL RELEASE  2003-2010   "twice on left; once on right"  . CHOLECYSTECTOMY  1987  . DEBRIDEMENT TENNIS ELBOW  2010  . VAGINAL HYSTERECTOMY  1977    Current Medications: Current Meds  Medication Sig  . albuterol (PROVENTIL HFA;VENTOLIN HFA) 108 (90 BASE) MCG/ACT inhaler Inhale 2 puffs into the lungs every 6 (six) hours as needed. For shortness of breath  . aspirin EC 81 MG tablet Take 81 mg by mouth daily.  Marland Kitchen atorvastatin (LIPITOR) 40 MG  tablet   . Blood Glucose Monitoring Suppl (ONETOUCH VERIO) w/Device KIT 1 each by Does not apply route daily.  . canagliflozin (INVOKANA) 100 MG TABS tablet Take 1 tablet (100 mg total) by mouth daily before breakfast.  . furosemide (LASIX) 20 MG tablet Take 20 mg by mouth 2 (two) times daily.   Marland Kitchen glucose blood (ONETOUCH VERIO) test strip Use as instructed to check sugar 3 times daily.  Marland Kitchen HUMALOG 100 UNIT/ML injection INJECT 20 TO 30 UNITS SUBCUTANEOUSLY 3 TIMES DAILY BEFORE MEALS  . Insulin Syringe-Needle U-100 (RELION INSULIN SYRINGE) 31G X 15/64" 1 ML MISC Use 3 times daily..  . Lancets (ONETOUCH ULTRASOFT) lancets Use as instructed to check sugar 3 times daily.  Marland Kitchen LANTUS 100 UNIT/ML injection INJECT 60 UNITS SUBCUTANEOUSLY ONCE DAILY AT BEDTIME  . ranitidine (ZANTAC) 300 MG tablet Take 300 mg by mouth 2 (two) times daily.  . Syringe/Needle, Disp, (SYRINGE LUER LOCK) 23G X 1" 3 ML MISC 1 Package by Does not apply route as needed.  . Vitamin D, Cholecalciferol, 1000 UNITS TABS Take 2,000 Units by mouth daily.      Allergies:   Codeine; Erythromycin; Lisinopril; Penicillins; Sulfonamide derivatives; Actos [pioglitazone]; Amlodipine; Benicar [  olmesartan]; Byetta 10 mcg pen [exenatide]; Glimepiride; Glipizide; Glyburide-metformin; Humalog [insulin lispro]; Invokana [canagliflozin]; Januvia [sitagliptin]; Reglan [metoclopramide]; Septra [sulfamethoxazole-trimethoprim]; Spironolactone; Victoza [liraglutide]; Zocor [simvastatin]; Losartan potassium; and Novolin r [insulin]   Social History   Social History  . Marital status: Married    Spouse name: N/A  . Number of children: N/A  . Years of education: N/A   Social History Main Topics  . Smoking status: Former Smoker    Packs/day: 0.75    Years: 6.00    Types: Cigarettes    Quit date: 01/21/1980  . Smokeless tobacco: Never Used  . Alcohol use No  . Drug use: No  . Sexual activity: Not Currently   Other Topics Concern  . None   Social  History Narrative   Lives with husband in a one story home.  Has 2 children.  Retired from [employer].  Education: 12th grade.  Trade schools.      Family History: The patient's ***family history includes Aortic aneurysm in her son; CVA in her mother; Coronary artery disease in her father; Diabetes Mellitus I in her brother, brother, father, maternal grandfather, maternal grandmother, mother, paternal grandfather, paternal grandmother, and sister; Stroke in her mother.  ROS:   Please see the history of present illness.    ROS  All other systems reviewed and negative.   EKGs/Labs/Other Studies Reviewed:    The following studies were reviewed today: ***  EKG:  EKG is *** ordered today.  The ekg ordered today demonstrates ***  Recent Labs: No results found for requested labs within last 8760 hours.   Recent Lipid Panel    Component Value Date/Time   CHOL 169 05/08/2015 0911   TRIG 116.0 05/08/2015 0911   HDL 38.60 (L) 05/08/2015 0911   CHOLHDL 4 05/08/2015 0911   VLDL 23.2 05/08/2015 0911   LDLCALC 108 (H) 05/08/2015 0911    Physical Exam:    VS:  BP 124/74   Pulse 76   Ht 5' 3.5" (1.613 m)   Wt 195 lb 6.4 oz (88.6 kg)   SpO2 99%   BMI 34.07 kg/m     Wt Readings from Last 3 Encounters:  10/15/16 195 lb 6.4 oz (88.6 kg)  09/12/16 204 lb (92.5 kg)  08/26/16 200 lb (90.7 kg)     GEN: *** Well nourished, well developed in no acute distress HEENT: Normal NECK: No JVD; No carotid bruits LYMPHATICS: No lymphadenopathy CARDIAC: ***RRR, no murmurs, rubs, gallops RESPIRATORY:  Clear to auscultation without rales, wheezing or rhonchi  ABDOMEN: Soft, non-tender, non-distended MUSCULOSKELETAL:  No edema; No deformity  SKIN: Warm and dry NEUROLOGIC:  Alert and oriented x 3 PSYCHIATRIC:  Normal affect   ASSESSMENT:    No diagnosis found. PLAN:    In order of problems listed above:  ***   Medication Adjustments/Labs and Tests Ordered: Current medicines are  reviewed at length with the patient today.  Concerns regarding medicines are outlined above.  No orders of the defined types were placed in this encounter.  No orders of the defined types were placed in this encounter.   Signed, Fransico Him, MD  10/15/2016 1:45 PM    [place]

## 2016-10-15 NOTE — Progress Notes (Signed)
Cardiology Office Note    Date:  10/15/2016   ID:  Yolanda Saunders, Yolanda Saunders 1945/02/09, MRN [id number]  PCP:  Shirline Frees, MD  Cardiologist:  Fransico Him, MD   Chief Complaint  Patient presents with  . Loss of Consciousness    History of Present Illness:  Yolanda Saunders is a 71 y.o. female who is being seen today for the evaluation of syncope at the request of Shirline Frees, MD.  This is a 71yo WF with a history of asthma, HTN, hyperlipidemia, DM, panic attacks, fibromyalgia and prior CVA after a cardiac cath in 2002 who is here for evaluation of syncope.  She has only had one syncopal episode.  This occurred about 6 weeks ago after taking a hot shower and she was standing brushing her teeth and then had syncope and fell into her sink.  She denies any palpitations, dizziness, nausea or diaphoresis prior to the event.  She did not injure herself.  She was completely oriented when she regained consciousness.  She denied any tongue biting of incontinence.  After that she was very exhausted the rest of the week.  She had no strength.  She says that she still has episodes of severe fatigue but seem worse than her typical fibromyalgia.  She has had some dizziness but no presyncope or syncope.  These can occur while sitting or doing activities.  She has GERD and gets chest discomfort which is what she had at the time of her cath in 2002.    Past Medical History:  Diagnosis Date  . Anemia 04/17/11   "long, long years ago"  . Angina 04/16/11   "that's what I'm here for"  . Anxiety   . Asthma   . Carpal tunnel syndrome   . Cataracts, bilateral   . CKD (chronic kidney disease), stage III   . Complication of anesthesia 1987   "affected my eyes; couldn't see anything but blurrs even the next day"  . CVA (cerebral infarction)    After cardiac catheter 02/2000  . Depression   . Edema   . Fibromyalgia   . GERD (gastroesophageal reflux disease)   . Headache(784.0)   . High cholesterol     . History of bronchitis   . Hypertension   . IBS (irritable bowel syndrome)   . Migraines   . Panic attacks 04/17/11   "don't take anything for it"  . Pneumonia 04/17/11   "probably as many as 7 times"  . Renal disorder 04/17/11   "they are working at 60% capacity"  . Shortness of breath on exertion    "cause of my asthma"  . Stroke [facility]) 2002   residual "problem w/using the right word, left 5 lesions on my brain/MRI; long term memory loss"  . Type II diabetes mellitus ([place])     Past Surgical History:  Procedure Laterality Date  . CARDIAC CATHETERIZATION  2002  . CARPAL TUNNEL RELEASE  2003-2010   "twice on left; once on right"  . CHOLECYSTECTOMY  1987  . DEBRIDEMENT TENNIS ELBOW  2010  . VAGINAL HYSTERECTOMY  1977    Current Medications: Current Meds  Medication Sig  . albuterol (PROVENTIL HFA;VENTOLIN HFA) 108 (90 BASE) MCG/ACT inhaler Inhale 2 puffs into the lungs every 6 (six) hours as needed. For shortness of breath  . aspirin EC 81 MG tablet Take 81 mg by mouth daily.  Marland Kitchen atorvastatin (LIPITOR) 40 MG tablet   . Blood Glucose Monitoring Suppl (ONETOUCH VERIO) w/Device KIT  1 each by Does not apply route daily.  . canagliflozin (INVOKANA) 100 MG TABS tablet Take 1 tablet (100 mg total) by mouth daily before breakfast.  . furosemide (LASIX) 20 MG tablet Take 20 mg by mouth 2 (two) times daily.   Marland Kitchen glucose blood (ONETOUCH VERIO) test strip Use as instructed to check sugar 3 times daily.  Marland Kitchen HUMALOG 100 UNIT/ML injection INJECT 20 TO 30 UNITS SUBCUTANEOUSLY 3 TIMES DAILY BEFORE MEALS  . Insulin Syringe-Needle U-100 (RELION INSULIN SYRINGE) 31G X 15/64" 1 ML MISC Use 3 times daily..  . Lancets (ONETOUCH ULTRASOFT) lancets Use as instructed to check sugar 3 times daily.  Marland Kitchen LANTUS 100 UNIT/ML injection INJECT 60 UNITS SUBCUTANEOUSLY ONCE DAILY AT BEDTIME  . ranitidine (ZANTAC) 300 MG tablet Take 300 mg by mouth 2 (two) times daily.  . Syringe/Needle, Disp, (SYRINGE LUER LOCK) 23G  X 1" 3 ML MISC 1 Package by Does not apply route as needed.  . Vitamin D, Cholecalciferol, 1000 UNITS TABS Take 2,000 Units by mouth daily.     Allergies:   Codeine; Erythromycin; Lisinopril; Penicillins; Sulfonamide derivatives; Actos [pioglitazone]; Amlodipine; Benicar [olmesartan]; Byetta 10 mcg pen [exenatide]; Glimepiride; Glipizide; Glyburide-metformin; Humalog [insulin lispro]; Invokana [canagliflozin]; Januvia [sitagliptin]; Reglan [metoclopramide]; Septra [sulfamethoxazole-trimethoprim]; Spironolactone; Victoza [liraglutide]; Zocor [simvastatin]; Losartan potassium; and Novolin r [insulin]   Social History   Social History  . Marital status: Married    Spouse name: N/A  . Number of children: N/A  . Years of education: N/A   Social History Main Topics  . Smoking status: Former Smoker    Packs/day: 0.75    Years: 6.00    Types: Cigarettes    Quit date: 01/21/1980  . Smokeless tobacco: Never Used  . Alcohol use No  . Drug use: No  . Sexual activity: Not Currently   Other Topics Concern  . None   Social History Narrative   Lives with husband in a one story home.  Has 2 children.  Retired from [employer].  Education: 12th grade.  Trade schools.      Family History:  The patient's family history includes Aortic aneurysm in her son; CVA in her mother; Coronary artery disease in her father; Diabetes Mellitus I in her brother, brother, father, maternal grandfather, maternal grandmother, mother, paternal grandfather, paternal grandmother, and sister; Stroke in her mother.   ROS:   Please see the history of present illness.    ROS All other systems reviewed and are negative.  PAD Screen 10/15/2016  Previous PAD dx? No  Previous surgical procedure? No  Pain with walking? No  Feet/toe relief with dangling? No  Painful, non-healing ulcers? No  Extremities discolored? No       PHYSICAL EXAM:   VS:  BP 124/74   Pulse 76   Ht 5' 3.5" (1.613 m)   Wt 195 lb 6.4 oz (88.6  kg)   SpO2 99%   BMI 34.07 kg/m    GEN: Well nourished, well developed, in no acute distress  HEENT: normal  Neck: no JVD, carotid bruits, or masses Cardiac: RRR; no murmurs, rubs, or gallops,no edema.  Intact distal pulses bilaterally.  Respiratory:  clear to auscultation bilaterally, normal work of breathing GI: soft, nontender, nondistended, + BS MS: no deformity or atrophy  Skin: warm and dry, no rash Neuro:  Alert and Oriented x 3, Strength and sensation are intact Psych: euthymic mood, full affect  Wt Readings from Last 3 Encounters:  10/15/16 195 lb 6.4 oz (88.6 kg)  09/12/16 204 lb (92.5 kg)  08/26/16 200 lb (90.7 kg)      Studies/Labs Reviewed:   EKG:  EKG is ordered today.  The ekg ordered today demonstrates NSR at NSR at 76bpm with septal infarct and ILBBB  Recent Labs: No results found for requested labs within last 8760 hours.   Lipid Panel    Component Value Date/Time   CHOL 169 05/08/2015 0911   TRIG 116.0 05/08/2015 0911   HDL 38.60 (L) 05/08/2015 0911   CHOLHDL 4 05/08/2015 0911   VLDL 23.2 05/08/2015 0911   LDLCALC 108 (H) 05/08/2015 0911    Additional studies/ records that were reviewed today include:  Office visit notes    ASSESSMENT:    1. Syncope, unspecified syncope type   2. Essential hypertension   3. Type 2 diabetes mellitus with stage 3 chronic kidney disease, with long-term current use of insulin ([place])   4. CKD (chronic kidney disease), stage III      PLAN:  In order of problems listed above:  1. Syncope - her symptoms are likely related to vasovagal or orthostatic hypotension from being in a hot shower prior to the episode.  The concerning additional symptoms is the severe fatigue she had for days after that is atypical of her fibromyalgia and she is still having episodes now of exertional fatigue.  She was standing at the time of her syncope but has had other episodes that have occurred sitting, making an arrhythmia concerning.   Her symptoms do not sound like a seizure.  She does have CRFs including HTN, hyperlipidemia and advanced age.  She has had CP but attributes it to her GERD.  Her EKG shows an incomplete LBBB with septal infarct.  I have recommended that we get a Lexiscan myoview to rule out ischemia.  I will also check a 2D echo to assess for structural heart disease.  I will get a 30 day heart monitor to rule out arrhythmia.    2.   HTN - BP is well controlled on exam today.    3.  DM - followed by her PCP  4.  CKD stage III - per PCP    Medication Adjustments/Labs and Tests Ordered: Current medicines are reviewed at length with the patient today.  Concerns regarding medicines are outlined above.  Medication changes, Labs and Tests ordered today are listed in the Patient Instructions below.  Patient Instructions  Medication Instructions:  Your provider recommends that you continue on your current medications as directed. Please refer to the Current Medication list given to you today.   None  Labwork: None  Testing/Procedures: Your provider has requested that you have an echocardiogram. Echocardiography is a painless test that uses sound waves to create images of your heart. It provides your doctor with information about the size and shape of your heart and how well your heart's chambers and valves are working. This procedure takes approximately one hour. There are no restrictions for this procedure.    Your provider has requested that you have a lexiscan myoview. For further information please visit [email or web address]. Please follow instruction sheet, as given.  Your physician has recommended that you wear an event monitor. Event monitors are medical devices that record the heart's electrical activity. Doctors most often [place] these monitors to diagnose arrhythmias. Arrhythmias are problems with the speed or rhythm of the heartbeat. The monitor is a small, portable device. You can wear one while you do your  normal daily activities. This is usually  used to diagnose what is causing palpitations/syncope (passing out).  Follow-Up: Your provider recommends that you schedule a follow-up appointment in 3 MONTHS with Dr. Radford Pax.  Any Other Special Instructions Will Be Listed Below (If Applicable).     If you need a refill on your cardiac medications before your next appointment, please call your pharmacy.      Signed, Fransico Him, MD  10/15/2016 2:58 PM    [address] [place], [place], [place]  [address] Phone: [phone]; Fax: [phone]

## 2016-10-15 NOTE — Patient Instructions (Signed)
Medication Instructions:  Your provider recommends that you continue on your current medications as directed. Please refer to the Current Medication list given to you today.   None  Labwork: None  Testing/Procedures: Your provider has requested that you have an echocardiogram. Echocardiography is a painless test that uses sound waves to create images of your heart. It provides your doctor with information about the size and shape of your heart and how well your heart's chambers and valves are working. This procedure takes approximately one hour. There are no restrictions for this procedure.    Your provider has requested that you have a lexiscan myoview. For further information please visit [email or web address] Please follow instruction sheet, as given.  Your physician has recommended that you wear an event monitor. Event monitors are medical devices that record the heart's electrical activity. Doctors most often [place] these monitors to diagnose arrhythmias. Arrhythmias are problems with the speed or rhythm of the heartbeat. The monitor is a small, portable device. You can wear one while you do your normal daily activities. This is usually used to diagnose what is causing palpitations/syncope (passing out).  Follow-Up: Your provider recommends that you schedule a follow-up appointment in 3 MONTHS with Dr. Mayford Knife.  Any Other Special Instructions Will Be Listed Below (If Applicable).     If you need a refill on your cardiac medications before your next appointment, please call your pharmacy.

## 2016-10-17 ENCOUNTER — Telehealth: Payer: Self-pay | Admitting: Cardiology

## 2016-10-17 NOTE — Telephone Encounter (Signed)
Error

## 2016-10-20 ENCOUNTER — Other Ambulatory Visit: Payer: Self-pay | Admitting: Cardiology

## 2016-10-20 DIAGNOSIS — R55 Syncope and collapse: Secondary | ICD-10-CM

## 2016-10-22 ENCOUNTER — Telehealth: Payer: Self-pay | Admitting: Internal Medicine

## 2016-10-22 ENCOUNTER — Other Ambulatory Visit: Payer: Self-pay

## 2016-10-22 ENCOUNTER — Telehealth (HOSPITAL_COMMUNITY): Payer: Self-pay | Admitting: *Deleted

## 2016-10-22 MED ORDER — INSULIN LISPRO 100 UNIT/ML ~~LOC~~ SOLN: SUBCUTANEOUS | 5 refills | Status: DC

## 2016-10-22 NOTE — Telephone Encounter (Signed)
**  Remind patient they can make refill requests via MyChart**  Medication refill request (Name & Dosage): HUMALOG 100 UNIT/ML injection [[id number]]     Preferred pharmacy (Name & Address):   [facility] [address] ([address] [place] - [address] [phone] (Phone)      Other comments (if applicable):   Patient ran out of medicine early, asking for increased prescription to reflect changes.

## 2016-10-22 NOTE — Telephone Encounter (Signed)
Submitted in with the increased dose from the last OV note.

## 2016-10-22 NOTE — Telephone Encounter (Signed)
Left message on voicemail per DPR in reference to upcoming appointment scheduled on 10/27/16 with detailed instructions given per Myocardial Perfusion Study Information Sheet for the test. LM to arrive 15 minutes early, and that it is imperative to arrive on time for appointment to keep from having the test rescheduled. If you need to cancel or reschedule your appointment, please call the office within 24 hours of your appointment. Failure to do so may result in a cancellation of your appointment, and a $50 no show fee. Phone number given for call back for any questions. Kajuana Shareef Jacqueline    

## 2016-10-23 ENCOUNTER — Ambulatory Visit (INDEPENDENT_AMBULATORY_CARE_PROVIDER_SITE_OTHER): Payer: Medicare Other

## 2016-10-23 DIAGNOSIS — R55 Syncope and collapse: Secondary | ICD-10-CM

## 2016-10-27 ENCOUNTER — Ambulatory Visit (HOSPITAL_COMMUNITY): Payer: Medicare Other | Attending: Internal Medicine

## 2016-10-27 ENCOUNTER — Other Ambulatory Visit: Payer: Self-pay

## 2016-10-27 ENCOUNTER — Ambulatory Visit (HOSPITAL_BASED_OUTPATIENT_CLINIC_OR_DEPARTMENT_OTHER): Payer: Medicare Other

## 2016-10-27 ENCOUNTER — Encounter (HOSPITAL_COMMUNITY): Payer: Medicare Other

## 2016-10-27 DIAGNOSIS — I10 Essential (primary) hypertension: Secondary | ICD-10-CM | POA: Diagnosis not present

## 2016-10-27 DIAGNOSIS — E119 Type 2 diabetes mellitus without complications: Secondary | ICD-10-CM | POA: Insufficient documentation

## 2016-10-27 DIAGNOSIS — R55 Syncope and collapse: Secondary | ICD-10-CM

## 2016-10-27 DIAGNOSIS — I639 Cerebral infarction, unspecified: Secondary | ICD-10-CM | POA: Diagnosis not present

## 2016-10-27 DIAGNOSIS — E785 Hyperlipidemia, unspecified: Secondary | ICD-10-CM | POA: Insufficient documentation

## 2016-10-27 LAB — MYOCARDIAL PERFUSION IMAGING
CHL CUP NUCLEAR SDS: 2
CHL CUP NUCLEAR SRS: 7
CHL CUP NUCLEAR SSS: 9
LHR: 0.32
LV dias vol: 88 mL (ref 46–106)
LV sys vol: 37 mL
Peak HR: 87 {beats}/min
Rest HR: 64 {beats}/min
TID: 0.91

## 2016-10-27 MED ORDER — REGADENOSON 0.4 MG/5ML IV SOLN
0.4000 mg | Freq: Once | INTRAVENOUS | Status: AC
  Administered 2016-10-27: 0.4 mg via INTRAVENOUS

## 2016-10-27 MED ORDER — TECHNETIUM TC 99M TETROFOSMIN IV KIT
10.3000 | PACK | Freq: Once | INTRAVENOUS | Status: AC | PRN
  Administered 2016-10-27: 10.3 via INTRAVENOUS
  Filled 2016-10-27: qty 11

## 2016-10-27 MED ORDER — TECHNETIUM TC 99M TETROFOSMIN IV KIT
32.5000 | PACK | Freq: Once | INTRAVENOUS | Status: AC | PRN
  Administered 2016-10-27: 32.5 via INTRAVENOUS
  Filled 2016-10-27: qty 33

## 2016-10-28 ENCOUNTER — Telehealth: Payer: Self-pay | Admitting: Cardiology

## 2016-10-28 NOTE — Telephone Encounter (Signed)
Spoke with patient who asked where she could go to get the compression stockings that have been ordered by Dr. Mayford Knife. I advised her of the 2 places that I am aware of, Paediatric nurse in [place] and [facility] [employer] in [place]. I gave her the addresses of both places and she thanked me for my help.

## 2016-10-28 NOTE — Telephone Encounter (Signed)
New message    Pt is calling asking about the prescription she was given for compression stockings. Please call.

## 2016-11-07 ENCOUNTER — Other Ambulatory Visit: Payer: Self-pay

## 2016-11-07 ENCOUNTER — Emergency Department (HOSPITAL_COMMUNITY): Payer: Medicare Other

## 2016-11-07 ENCOUNTER — Emergency Department (HOSPITAL_COMMUNITY)
Admission: EM | Admit: 2016-11-07 | Discharge: 2016-11-07 | Disposition: A | Discharge status: Home / Self Care | Payer: Medicare Other | Attending: Emergency Medicine | Admitting: Emergency Medicine

## 2016-11-07 ENCOUNTER — Encounter (HOSPITAL_COMMUNITY): Payer: Self-pay

## 2016-11-07 DIAGNOSIS — E1122 Type 2 diabetes mellitus with diabetic chronic kidney disease: Secondary | ICD-10-CM | POA: Insufficient documentation

## 2016-11-07 DIAGNOSIS — Z79899 Other long term (current) drug therapy: Secondary | ICD-10-CM | POA: Insufficient documentation

## 2016-11-07 DIAGNOSIS — I129 Hypertensive chronic kidney disease with stage 1 through stage 4 chronic kidney disease, or unspecified chronic kidney disease: Secondary | ICD-10-CM | POA: Diagnosis not present

## 2016-11-07 DIAGNOSIS — Z87891 Personal history of nicotine dependence: Secondary | ICD-10-CM | POA: Diagnosis not present

## 2016-11-07 DIAGNOSIS — Z7982 Long term (current) use of aspirin: Secondary | ICD-10-CM | POA: Insufficient documentation

## 2016-11-07 DIAGNOSIS — G43009 Migraine without aura, not intractable, without status migrainosus: Secondary | ICD-10-CM | POA: Insufficient documentation

## 2016-11-07 DIAGNOSIS — J45909 Unspecified asthma, uncomplicated: Secondary | ICD-10-CM | POA: Diagnosis not present

## 2016-11-07 DIAGNOSIS — N183 Chronic kidney disease, stage 3 (moderate): Secondary | ICD-10-CM | POA: Insufficient documentation

## 2016-11-07 DIAGNOSIS — I10 Essential (primary) hypertension: Secondary | ICD-10-CM

## 2016-11-07 DIAGNOSIS — Z794 Long term (current) use of insulin: Secondary | ICD-10-CM | POA: Diagnosis not present

## 2016-11-07 DIAGNOSIS — R51 Headache: Secondary | ICD-10-CM | POA: Diagnosis present

## 2016-11-07 LAB — CBG MONITORING, ED: Glucose-Capillary: 146 mg/dL — ABNORMAL HIGH (ref 65–99)

## 2016-11-07 LAB — I-STAT CHEM 8, ED
BUN: 17 mg/dL (ref 6–20)
CREATININE: 1.1 mg/dL — AB (ref 0.44–1.00)
Calcium, Ion: 1.25 mmol/L (ref 1.15–1.40)
Chloride: 106 mmol/L (ref 101–111)
GLUCOSE: 172 mg/dL — AB (ref 65–99)
HEMATOCRIT: 40 % (ref 36.0–46.0)
HEMOGLOBIN: 13.6 g/dL (ref 12.0–15.0)
POTASSIUM: 4.1 mmol/L (ref 3.5–5.1)
Sodium: 145 mmol/L (ref 135–145)
TCO2: 27 mmol/L (ref 22–32)

## 2016-11-07 LAB — PROTIME-INR
INR: 0.91
PROTHROMBIN TIME: 12.2 s (ref 11.4–15.2)

## 2016-11-07 LAB — I-STAT TROPONIN, ED: TROPONIN I, POC: 0 ng/mL (ref 0.00–0.08)

## 2016-11-07 MED ORDER — ASPIRIN-ACETAMINOPHEN-CAFFEINE 250-250-65 MG PO TABS
2.0000 | ORAL_TABLET | Freq: Once | ORAL | Status: AC
  Administered 2016-11-07: 2 via ORAL
  Filled 2016-11-07 (×2): qty 2

## 2016-11-07 NOTE — ED Provider Notes (Signed)
[place] EMERGENCY DEPARTMENT Provider Note   CSN: [id number] Arrival date & time: 11/07/16  3335     History   Chief Complaint Chief Complaint  Patient presents with  . Headache  . Hypertension    HPI Yolanda Saunders is a 71 y.o. female.  Patient c/o intermittent headache for the past few days, worse since early this AM. Dull to throbbing, moderate. Hx migraines. No acute/thunderclap type pain. No neck pain or stiffness. Denies eye pain or change in vision. No sinus pain or pressure. Denies associated numbness/weakness, problems w balance or coordination, or loss of normal functional ability. No recent head trauma. No syncope. Pt is also concerned he bp is high, not currently on meds for same.    The history is provided by the patient.  Headache   Pertinent negatives include no fever and no shortness of breath.  Hypertension  Associated symptoms include headaches. Pertinent negatives include no chest pain, no abdominal pain and no shortness of breath.  Weakness  Associated symptoms include headaches. Pertinent negatives include no shortness of breath, no chest pain and no confusion.    Past Medical History:  Diagnosis Date  . Anemia 04/17/11   "long, long years ago"  . Angina 04/16/11   "that's what I'm here for"  . Anxiety   . Asthma   . Carpal tunnel syndrome   . Cataracts, bilateral   . CKD (chronic kidney disease), stage III ([place])   . Complication of anesthesia 1987   "affected my eyes; couldn't see anything but blurrs even the next day"  . CVA (cerebral infarction)    After cardiac catheter 02/2000  . Depression   . Edema   . Fibromyalgia   . GERD (gastroesophageal reflux disease)   . Headache(784.0)   . High cholesterol   . History of bronchitis   . Hypertension   . IBS (irritable bowel syndrome)   . Migraines   . Panic attacks 04/17/11   "don't take anything for it"  . Pneumonia 04/17/11   "probably as many as 7 times"  . Renal  disorder 04/17/11   "they are working at 60% capacity"  . Shortness of breath on exertion    "cause of my asthma"  . Stroke [facility]) 2002   residual "problem w/using the right word, left 5 lesions on my brain/MRI; long term memory loss"  . Type II diabetes mellitus [facility])     Patient Active Problem List   Diagnosis Date Noted  . Syncope 10/15/2016  . CKD (chronic kidney disease), stage III ([place]) 10/15/2016  . Type 2 diabetes mellitus with stage 3 chronic kidney disease, with long-term current use of insulin ([place]) 08/07/2015  . Bilateral leg weakness 06/06/2014  . Abnormality of gait 06/06/2014  . Lumbosacral stenosis 06/06/2014  . Chest pain 04/16/2011  . Hyperlipidemia 09/13/2008  . OBESITY 09/13/2008  . Essential hypertension 09/13/2008  . CVA 09/13/2008  . ASTHMA 09/13/2008  . IRRITABLE BOWEL SYNDROME 09/13/2008  . GALLSTONES 09/13/2008  . UTI 09/13/2008  . ARTHRITIS 09/13/2008  . FIBROMYALGIA 09/13/2008  . HEADACHE, CHRONIC 09/13/2008    Past Surgical History:  Procedure Laterality Date  . CARDIAC CATHETERIZATION  2002  . CARPAL TUNNEL RELEASE  2003-2010   "twice on left; once on right"  . CHOLECYSTECTOMY  1987  . DEBRIDEMENT TENNIS ELBOW  2010  . VAGINAL HYSTERECTOMY  1977    OB History    No data available       Home Medications  Prior to Admission medications   Medication Sig Start Date End Date Taking? Authorizing Provider  albuterol (PROVENTIL HFA;VENTOLIN HFA) 108 (90 BASE) MCG/ACT inhaler Inhale 2 puffs into the lungs every 6 (six) hours as needed. For shortness of breath   Yes [provider]  aspirin EC 81 MG tablet Take 81 mg by mouth daily.   Yes [provider]  atorvastatin (LIPITOR) 40 MG tablet  01/17/16  Yes [provider]  Blood Glucose Monitoring Suppl (ONETOUCH VERIO) w/Device KIT 1 each by Does not apply route daily. 11/14/15  Yes Philemon Kingdom, MD  furosemide (LASIX) 20 MG tablet Take 20 mg by mouth 2 (two)  times daily.    Yes [provider]  glucose blood (ONETOUCH VERIO) test strip Use as instructed to check sugar 3 times daily. 11/14/15  Yes Philemon Kingdom, MD  insulin lispro (HUMALOG) 100 UNIT/ML injection Inject 20-25 units at breakfast, 30 units at lunch, and 34-36 units at dinner. 10/22/16  Yes Philemon Kingdom, MD  Insulin Syringe-Needle U-100 (RELION INSULIN SYRINGE) 31G X 15/64" 1 ML MISC Use 3 times daily.. 11/08/15  Yes Philemon Kingdom, MD  Lancets [facility] ULTRASOFT) lancets Use as instructed to check sugar 3 times daily. 11/14/15  Yes Philemon Kingdom, MD  LANTUS 100 UNIT/ML injection INJECT 60 UNITS SUBCUTANEOUSLY ONCE DAILY AT BEDTIME 05/15/16  Yes Philemon Kingdom, MD  ranitidine (ZANTAC) 300 MG tablet Take 300 mg by mouth 2 (two) times daily.   Yes [provider]  Syringe/Needle, Disp, (SYRINGE LUER LOCK) 23G X 1" 3 ML MISC 1 Package by Does not apply route as needed. 07/21/14  Yes Patel, Donika K, DO  Vitamin D, Cholecalciferol, 1000 UNITS TABS Take 2,000 Units by mouth daily.    Yes [provider]  canagliflozin (INVOKANA) 100 MG TABS tablet Take 1 tablet (100 mg total) by mouth daily before breakfast. Patient not taking: Reported on 11/07/2016 11/01/15   Philemon Kingdom, MD    Family History Family History  Problem Relation Age of Onset  . Coronary artery disease Father   . Diabetes Mellitus I Father   . CVA Mother   . Stroke Mother   . Diabetes Mellitus I Mother   . Diabetes Mellitus I Sister   . Diabetes Mellitus I Brother   . Diabetes Mellitus I Maternal Grandmother   . Diabetes Mellitus I Maternal Grandfather   . Diabetes Mellitus I Paternal Grandmother   . Diabetes Mellitus I Paternal Grandfather   . Diabetes Mellitus I Brother   . Aortic aneurysm Son     Social History Social History  Substance Use Topics  . Smoking status: Former Smoker    Packs/day: 0.75    Years: 6.00    Types: Cigarettes    Quit date: 01/21/1980  .  Smokeless tobacco: Never Used  . Alcohol use No     Allergies   Codeine; Erythromycin; Lisinopril; Penicillins; Sulfonamide derivatives; Actos [pioglitazone]; Amlodipine; Benicar [olmesartan]; Byetta 10 mcg pen [exenatide]; Glimepiride; Glipizide; Glyburide-metformin; Humalog [insulin lispro]; Invokana [canagliflozin]; Januvia [sitagliptin]; Reglan [metoclopramide]; Septra [sulfamethoxazole-trimethoprim]; Spironolactone; Victoza [liraglutide]; Zocor [simvastatin]; Losartan potassium; and Novolin r [insulin]   Review of Systems Review of Systems  Constitutional: Negative for fever.  HENT: Negative for sinus pain.   Eyes: Negative for pain, redness and visual disturbance.  Respiratory: Negative for shortness of breath.   Cardiovascular: Negative for chest pain.  Gastrointestinal: Negative for abdominal pain.  Genitourinary: Negative for flank pain.  Musculoskeletal: Negative for neck pain and neck stiffness.  Skin: Negative for rash.  Neurological: Positive for headaches.  Hematological: Does not bruise/bleed easily.  Psychiatric/Behavioral: Negative for confusion.     Physical Exam Updated Vital Signs BP (!) 175/70 (BP Location: Left Arm)   Pulse 63   Temp 97.7 F (36.5 C) (Oral)   Resp 16   Ht 1.613 m (5' 3.5")   Wt 90.7 kg (200 lb)   SpO2 100%   BMI 34.87 kg/m   Physical Exam  Constitutional: She is oriented to person, place, and time. She appears well-developed and well-nourished. No distress.  HENT:  Head: Atraumatic.  Nose: Nose normal.  Mouth/Throat: Oropharynx is clear and moist.  No sinus or temporal tenderness.  Eyes: Pupils are equal, round, and reactive to light. Conjunctivae and EOM are normal. No scleral icterus.  Neck: Neck supple. No tracheal deviation present. No thyromegaly present.  No stiffness or rigidity.   Cardiovascular: Normal rate, regular rhythm, normal heart sounds and intact distal pulses.  Exam reveals no gallop and no friction rub.   No  murmur heard. Pulmonary/Chest: Effort normal and breath sounds normal. No respiratory distress.  Abdominal: Soft. Normal appearance and bowel sounds are normal. She exhibits no distension. There is no tenderness.  Genitourinary:  Genitourinary Comments: No cva tenderness.  Musculoskeletal: Normal range of motion. She exhibits no edema or tenderness.  Neurological: She is alert and oriented to person, place, and time. No cranial nerve deficit.  Speech clear/lfuent. Motor intact bilaterally. str 5/5. No pronator drift. Steady gait.   Skin: Skin is warm and dry. No rash noted. She is not diaphoretic.  Psychiatric: She has a normal mood and affect.  Nursing note and vitals reviewed.    ED Treatments / Results  Labs (all labs ordered are listed, but only abnormal results are displayed) Results for orders placed or performed during the hospital encounter of 11/07/16  Protime-INR  Result Value Ref Range   Prothrombin Time 12.2 11.4 - 15.2 seconds   INR 0.91   I-stat troponin, ED  Result Value Ref Range   Troponin i, poc 0.00 0.00 - 0.08 ng/mL   Comment 3          CBG monitoring, ED  Result Value Ref Range   Glucose-Capillary 146 (H) 65 - 99 mg/dL   Comment 1 Notify RN    Comment 2 Document in Chart   I-Stat Chem 8, ED  Result Value Ref Range   Sodium 145 135 - 145 mmol/L   Potassium 4.1 3.5 - 5.1 mmol/L   Chloride 106 101 - 111 mmol/L   BUN 17 6 - 20 mg/dL   Creatinine, Ser 1.10 (H) 0.44 - 1.00 mg/dL   Glucose, Bld 172 (H) 65 - 99 mg/dL   Calcium, Ion 1.25 1.15 - 1.40 mmol/L   TCO2 27 22 - 32 mmol/L   Hemoglobin 13.6 12.0 - 15.0 g/dL   HCT 40.0 36.0 - 46.0 %   Ct Head Wo Contrast  Result Date: 11/07/2016 CLINICAL DATA:  71 year old female with right occipital headache and dizziness. History of prior stroke. EXAM: CT HEAD WITHOUT CONTRAST TECHNIQUE: Contiguous axial images were obtained from the base of the skull through the vertex without intravenous contrast. COMPARISON:   07/18/2013 brain MR. FINDINGS: Brain: No intracranial hemorrhage or CT evidence of large acute infarct. Chronic microvascular changes. Coarse calcification lower central pons of indeterminate etiology, possibly related to remote insult. No intracranial mass lesion noted on this unenhanced exam. Vascular: No hyperdense vessel. Skull: No acute abnormality. Sinuses/Orbits:  No acute orbital abnormality. Paranasal sinuses are clear. Other: Mastoid air cells and middle ear cavities are clear. IMPRESSION: No intracranial hemorrhage or CT evidence of large acute infarct. Chronic microvascular changes. Nonspecific small calcification central lower pons may be related to remote insult. Electronically Signed   By: Genia Del M.D.   On: 11/07/2016 11:07    EKG  EKG Interpretation  Date/Time:  Friday November 07 2016 12:37:55 EDT Ventricular Rate:  70 PR Interval:    QRS Duration: 111 QT Interval:  454 QTC Calculation: 490 R Axis:   -17 Text Interpretation:  Sinus rhythm LVH with secondary repolarization abnormality No significant change since last tracing Confirmed by Lajean Saver [phone]) on 11/07/2016 12:52:39 PM       Radiology Ct Head Wo Contrast  Result Date: 11/07/2016 CLINICAL DATA:  71 year old female with right occipital headache and dizziness. History of prior stroke. EXAM: CT HEAD WITHOUT CONTRAST TECHNIQUE: Contiguous axial images were obtained from the base of the skull through the vertex without intravenous contrast. COMPARISON:  07/18/2013 brain MR. FINDINGS: Brain: No intracranial hemorrhage or CT evidence of large acute infarct. Chronic microvascular changes. Coarse calcification lower central pons of indeterminate etiology, possibly related to remote insult. No intracranial mass lesion noted on this unenhanced exam. Vascular: No hyperdense vessel. Skull: No acute abnormality. Sinuses/Orbits: No acute orbital abnormality. Paranasal sinuses are clear. Other: Mastoid air cells and middle  ear cavities are clear. IMPRESSION: No intracranial hemorrhage or CT evidence of large acute infarct. Chronic microvascular changes. Nonspecific small calcification central lower pons may be related to remote insult. Electronically Signed   By: Genia Del M.D.   On: 11/07/2016 11:07    Procedures Procedures (including critical care time)  Medications Ordered in ED Medications  aspirin-acetaminophen-caffeine (EXCEDRIN MIGRAINE) per tablet 2 tablet (not administered)     Initial Impression / Assessment and Plan / ED Course  I have reviewed the triage vital signs and the nursing notes.  Pertinent labs & imaging results that were available during my care of the patient were reviewed by me and considered in my medical decision making (see chart for details).  Labs sent. Head ct.  Head ct neg for acute process.  Recheck bp.  excedrin po for pain.  Reviewed nursing notes and prior charts for additional history.   bp mildly improved on recheck. 164/60.  Will have f/u pcp as outpt.   Patient current appears stable for d/c.     Final Clinical Impressions(s) / ED Diagnoses   Final diagnoses:  None    New Prescriptions New Prescriptions   No medications on file     Lajean Saver, MD 11/07/16 1253

## 2016-11-07 NOTE — ED Notes (Signed)
Called [facility] requesting medication so patient can be discharged. Reported to be working on it. Patient to be discharged following medication administration.

## 2016-11-07 NOTE — ED Notes (Signed)
Dr. Steinl at bedside 

## 2016-11-07 NOTE — ED Triage Notes (Addendum)
Pt. Reports having an intermittent headache since Monday but last night the headache was severe and even touching the pillow was painful .  The pain was located rt. Side of the head but presenty it locarted in the back of gthe head.  She denies any n/v  She is having dizziness and is wearing a heart monitor at present time.  Skin is pink, warm and dry.  Speech is clear, Pt. Is alert and oriented X4.  NIH negative, Zenaida NieceVan is negative. Pt. Has a hx of stroke and it presented as these symptoms in the past. CBG was 217 this morning at her home.

## 2016-11-07 NOTE — Discharge Instructions (Signed)
It was our pleasure to provide your ER care today - we hope that you feel better.  Your head scan was read by our radiologist as showing no acute process.  Overall, your lab results look good - your glucose level was elevated (172).  Continue your diabetic medication, and follow up with your doctor.   Also follow up with your doctor in the next 1-2 weeks for recheck of your blood pressure as it is high today.   Rest. Drink adequate fluids. Take acetaminophen and/or ibuprofen as need.  Return to ER if worse, new symptoms, new or severe pain, trouble breathing, other concern.

## 2016-11-10 ENCOUNTER — Telehealth: Payer: Self-pay | Admitting: Cardiology

## 2016-11-10 NOTE — Telephone Encounter (Signed)
I spoke with pt. She reports she has been on the phone frequently with the monitor company. Monitor is beeping often and keeping her from sleeping.  I asked her if there was anything our office could do to assist her but she reports she is done wearing the monitor and will return to the company tomorrow. She wore the monitor about 3 weeks.

## 2016-11-10 NOTE — Telephone Encounter (Signed)
New message    Pt is calling stating that she is done wearing the heart montior she has. She said it is beeping and she has been on the phone with the company a lot and she is done. She is going to return it.

## 2016-11-24 ENCOUNTER — Other Ambulatory Visit: Payer: Self-pay | Admitting: Internal Medicine

## 2016-11-27 ENCOUNTER — Encounter: Payer: Self-pay | Admitting: Internal Medicine

## 2016-11-27 ENCOUNTER — Ambulatory Visit: Payer: Medicare Other | Admitting: Internal Medicine

## 2016-11-27 ENCOUNTER — Encounter: Payer: Medicare Other | Admitting: Dietician

## 2016-11-27 VITALS — BP 152/80 | HR 80 | Wt 196.0 lb

## 2016-11-27 DIAGNOSIS — Z794 Long term (current) use of insulin: Secondary | ICD-10-CM

## 2016-11-27 DIAGNOSIS — Z23 Encounter for immunization: Secondary | ICD-10-CM | POA: Diagnosis not present

## 2016-11-27 DIAGNOSIS — E669 Obesity, unspecified: Secondary | ICD-10-CM | POA: Diagnosis not present

## 2016-11-27 DIAGNOSIS — N183 Chronic kidney disease, stage 3 unspecified: Secondary | ICD-10-CM

## 2016-11-27 DIAGNOSIS — E1122 Type 2 diabetes mellitus with diabetic chronic kidney disease: Secondary | ICD-10-CM

## 2016-11-27 LAB — POCT GLYCOSYLATED HEMOGLOBIN (HGB A1C): HEMOGLOBIN A1C: 8.6

## 2016-11-27 NOTE — Patient Instructions (Addendum)
Please increase: - Lantus 60 >> 70 units at night - Humalog:  b'fast 30 units >> 35  lunch 30 units >> 35  dinner 35 units If you plan to be active after a particular meal, then decrease the insulin with that meal by 5-10 units.  Please come back for a follow-up appointment in 3 months.

## 2016-11-27 NOTE — Progress Notes (Signed)
Patient ID: Yolanda Saunders, female   DOB: 1945-07-03, 71 y.o.   MRN: [id number]  HPI: Yolanda Saunders is a 71 y.o.-year-old female, returning for follow-up for DM2, dx 1999, insulin-dependent since ~2012, uncontrolled, with complications (CKD stage 3, h/o stroke post cardiac cath in 2002, macroalbuminuria). She saw Dr Buddy Duty in the past. Last visit with me 3 mo ago.  She was in the ED for high BP >> started a new med (Toprol XL) >> allergic to this (she has multiple other intolerances). She had diarrhea, SOB, rash, and gained weight. Sugars also higher. After stopping >> started to lose weight and feels a little better, but sugars are still high.  After this episode, she developed gastro-enteritis.   She also had a heart monitor as she was passing out. Results pending.  Last hemoglobin A1c was: Lab Results  Component Value Date   HGBA1C 8.5 08/26/2016   HGBA1C 9.5 02/11/2016   HGBA1C 9.1 11/01/2015  07/31/2014: 13.4%   Pt could not afford the analog insulin products >> we changed to:  Insulin Before breakfast Before lunch Before dinner  Regular 25 - smaller meal 30 - larger meal 25 - smaller meal 30 - larger meal 25 - smaller meal 30 - larger meal  NPH 40  30   Now  back to Lantus and Humalog: - Lantus 60 units at night - Humalog:  b'fast 20-25 units >> 30  lunch 30 units - may forget this!  dinner 34-36 units >> 35  If you plan to be active after a particular meal, then decrease the insulin with that meal by 5-10 units. We stopped Invokana 100 mg before b'fast >> lethargy, fatigue - in 11/2015. I suggested Bydureon 2 mg weekly - in donut hole >> could not start Tried Victoza >> helped, but AP She stopped Metformin ER 500 mg 2x a day because of AP, but does have IBS. She feels better after she stopped Tried Byetta >> N/V Tried Metformin >> AP, diarrhea, gas Tried SU >> upset stomach, nausea Has been on Invokana before >> yeast inf She had elevated lipase (72) in 09/2011  while on Victoza.  Pt checks her sugars 2x a day (no log - forgot >> sugars now all upper 200s-400s, but before the above episode, sugars were much better: - am:   62, 87, 154-202, 276, 290 >> 80, 90-271 >> 57, 124-244 >> 230s - 2h after b'fast: 82-263 >> n/c >> 192, 290 >> 67, 160 >> 258 >> n/c - before lunch:110, 136, 189 >> 130-227 >> 65-174, 210 >> 85-191 >> n/c - 2h after lunch: n/c >> 216-310 >> 132-207 >> 66, 174-301 >> n/c - before dinner:  152-213, 302 >> 76, 120-175, 256 >> 101-171 >> 400s - 2h after dinner: 182-292 >> 240--278 (> 1 mo ago) >> 216-427 (icecream) >> 200s - bedtime:187-241, 310 >> 72, 210-341  >> n/c - nighttime:  400 x1 >> 60, 66, 180 - lows whean watching grandchildren >> 394 (skipped insulin) >> n/c Lowest sugar was: 68 >> 57 >> 65 1 mo ago.  she has hypoglycemia awareness at 60s.   Highest sugar was 412 x1 >> 427 >> 400s.  Glucometer: Free Style  Pt's meals are: - Breakfast: oatmeal - Lunch: sandwich; chicken nuggets and fries - Dinner: heaviest: meat + 2 veggies + dessert later - Snacks: "too many" - cookies, sugary snacks  - + CKD, last BUN/creatinine: Lab Results  Component Value Date   BUN 17 11/07/2016  CREATININE 1.10 (H) 11/07/2016  Had albuminuria: Lab Results  Component Value Date   MICRALBCREAT 32.0 (H) 02/11/2016  04/2014: ACR: >300 She saw a nephrologist in the past. Intolerant to ACEI and ARBs. - last set of lipids: 07/09/2016: 175/122/36/150 Lab Results  Component Value Date   CHOL 169 05/08/2015   HDL 38.60 (L) 05/08/2015   LDLCALC 108 (H) 05/08/2015   TRIG 116.0 05/08/2015   CHOLHDL 4 05/08/2015  On lipitor - last eye exam was in 06/2016 >> + DR. Has cataracts.  - no numbness and tingling in her feet.  07/09/2016, TSH normal, at 3.92.  She is seeing Dr Narda Amber >> investigation for disequllibrium and word difficulty. She also has HTN, HL, anemia, GERD.  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  ROS: Constitutional:  no weight gain/no weight loss, no fatigue, no subjective hyperthermia, no subjective hypothermia Eyes: no blurry vision, no xerophthalmia ENT: + sore throat, no nodules palpated in throat, no dysphagia, no odynophagia, no hoarseness Cardiovascular: no CP/+ SOB/no palpitations/+ leg swelling Respiratory: no cough/+ SOB/no wheezing Gastrointestinal: no N/no V/+ had D - resolved/no C/no acid reflux Musculoskeletal: + muscle aches/+ joint aches Skin: no rashes, no hair loss Neurological: no tremors/no numbness/no tingling/no dizziness, + HA  I reviewed pt's medications, allergies, PMH, social hx, family hx, and changes were documented in the history of present illness. Otherwise, unchanged from my initial visit note.  Past Medical History:  Diagnosis Date  . Anemia 04/17/11   "long, long years ago"  . Angina 04/16/11   "that's what I'm here for"  . Anxiety   . Asthma   . Carpal tunnel syndrome   . Cataracts, bilateral   . CKD (chronic kidney disease), stage III ([place])   . Complication of anesthesia 1987   "affected my eyes; couldn't see anything but blurrs even the next day"  . CVA (cerebral infarction)    After cardiac catheter 02/2000  . Depression   . Edema   . Fibromyalgia   . GERD (gastroesophageal reflux disease)   . Headache(784.0)   . High cholesterol   . History of bronchitis   . Hypertension   . IBS (irritable bowel syndrome)   . Migraines   . Panic attacks 04/17/11   "don't take anything for it"  . Pneumonia 04/17/11   "probably as many as 7 times"  . Renal disorder 04/17/11   "they are working at 60% capacity"  . Shortness of breath on exertion    "cause of my asthma"  . Stroke [facility]) 2002   residual "problem w/using the right word, left 5 lesions on my brain/MRI; long term memory loss"  . Type II diabetes mellitus ([place])    Past Surgical History:  Procedure Laterality Date  . CARDIAC CATHETERIZATION  2002  . CARPAL TUNNEL RELEASE  2003-2010   "twice on left; once  on right"  . CHOLECYSTECTOMY  1987  . DEBRIDEMENT TENNIS ELBOW  2010  . VAGINAL HYSTERECTOMY  1977   Social History   Social History  . Marital Status: Married   Social History Main Topics  . Smoking status: Former Smoker -- 0.75 packs/day for 6 years    Types: Cigarettes    Quit date: 01/21/1980  . Smokeless tobacco: Never Used  . Alcohol Use: No  . Drug Use: No   Social History Narrative   Lives with husband in a one story home.  Has 2 children.  Retired from [employer].  Education: 12th grade.  Trade schools.    Current Outpatient Medications on File Prior to Visit  Medication Sig Dispense Refill  . albuterol (PROVENTIL HFA;VENTOLIN HFA) 108 (90 BASE) MCG/ACT inhaler Inhale 2 puffs into the lungs every 6 (six) hours as needed. For shortness of breath    . aspirin EC 81 MG tablet Take 81 mg by mouth daily.    Marland Kitchen atorvastatin (LIPITOR) 40 MG tablet     . Blood Glucose Monitoring Suppl (ONETOUCH VERIO) w/Device KIT 1 each by Does not apply route daily. 1 kit 0  . furosemide (LASIX) 20 MG tablet Take 20 mg by mouth 2 (two) times daily.     Marland Kitchen glucose blood (ONETOUCH VERIO) test strip Use as instructed to check sugar 3 times daily. 200 each 5  . insulin lispro (HUMALOG) 100 UNIT/ML injection Inject 20-25 units at breakfast, 30 units at lunch, and 34-36 units at dinner. 30 mL 5  . Insulin Syringe-Needle U-100 (RELION INSULIN SYRINGE) 31G X 15/64" 1 ML MISC Use 3 times daily.. 300 each 2  . Lancets (ONETOUCH ULTRASOFT) lancets Use as instructed to check sugar 3 times daily. 200 each 3  . LANTUS 100 UNIT/ML injection INJECT 60 UNITS SUBCUTANEOUSLY AT BEDTIME 20 mL 5  . ranitidine (ZANTAC) 300 MG tablet Take 300 mg by mouth 2 (two) times daily.    . Syringe/Needle, Disp, (SYRINGE LUER LOCK) 23G X 1" 3 ML MISC 1 Package by Does not apply route as needed. 50 each 0  . Vitamin D, Cholecalciferol, 1000 UNITS TABS Take 2,000 Units by mouth daily.     . canagliflozin (INVOKANA) 100 MG TABS  tablet Take 1 tablet (100 mg total) by mouth daily before breakfast. (Patient not taking: Reported on 11/07/2016) 30 tablet 5   No current facility-administered medications on file prior to visit.    Allergies  Allergen Reactions  . Codeine Other (See Comments)    "makes me crazy; see things; delusional"  . Erythromycin Other (See Comments)    "peeled skin; like a sunburn & I turn the color of the pill; a kind of purple-look"  . Lisinopril Cough    "cause I have asthma"  . Penicillins Rash and Other (See Comments)    "puffy blisters  . Sulfonamide Derivatives Hives    "watery hives"  . Metoprolol Diarrhea and Nausea And Vomiting    Rapid weight gain.  . Actos [Pioglitazone]     Upset GI  . Amlodipine     Syncope  . Benicar [Olmesartan] Other (See Comments)    Dizziness and HA  . Byetta 10 Mcg Pen [Exenatide] Nausea And Vomiting    5 MCG pen  . Glimepiride     Upset GI  . Glipizide     Upset GI  . Glyburide-Metformin     Myalgias  . Humalog [Insulin Lispro]     Headache  . Invokana [Canagliflozin]     Yeast infections  . Januvia [Sitagliptin]     uti  . Reglan [Metoclopramide]     unknown  . Septra [Sulfamethoxazole-Trimethoprim] Hives  . Spironolactone     unk  . Victoza [Liraglutide] Diarrhea    Abdominal pain   . Zocor [Simvastatin]     unknown  . Losartan Potassium Rash    Upset GI  . Novolin R [Insulin] Swelling and Rash    70/30   Family History  Problem Relation Age of Onset  . Coronary artery disease Father   . Diabetes Mellitus I Father   . CVA  Mother   . Stroke Mother   . Diabetes Mellitus I Mother   . Diabetes Mellitus I Sister   . Diabetes Mellitus I Brother   . Diabetes Mellitus I Maternal Grandmother   . Diabetes Mellitus I Maternal Grandfather   . Diabetes Mellitus I Paternal Grandmother   . Diabetes Mellitus I Paternal Grandfather   . Diabetes Mellitus I Brother   . Aortic aneurysm Son    PE: BP (!) 152/80 (BP Location: Left Arm,  Patient Position: Sitting)   Pulse 80   Wt 196 lb (88.9 kg)   SpO2 97%   BMI 34.18 kg/m  Body mass index is 34.18 kg/m. Wt Readings from Last 3 Encounters:  11/27/16 196 lb (88.9 kg)  11/07/16 200 lb (90.7 kg)  10/27/16 195 lb (88.5 kg)   Constitutional: overweight, in NAD Eyes: PERRLA, EOMI, no exophthalmos ENT: moist mucous membranes, no thyromegaly, no cervical lymphadenopathy Cardiovascular: RRR, No MRG, + B LE edema  - wears compression hoses Respiratory: CTA B Gastrointestinal: abdomen soft, NT, ND, BS+ Musculoskeletal: no deformities, strength intact in all 4 Skin: moist, warm, no rashes Neurological: no tremor with outstretched hands, DTR normal in all 4  ASSESSMENT: 1. DM2, insulin-dependent, uncontrolled, with complications - CKD stage 3 - h/o stroke post cardiac cath in 2002 - macroalbuminuria >> improved - Lipoatrophy at the site of insulin injections  2. CKD   3. Obesity class 2 BMI Classification:  < 18.5 underweight   18.5-24.9 normal weight   25.0-29.9 overweight   30.0-34.9 class I obesity   35.0-39.9 class II obesity   ? 40.0 class III obesity   PLAN:  1. Patient with long-standing, uncontrolled DM, on basal-bolus insulin regimen, with worse sugars c/w last visit, despite increase in her insulin doses 2/2 multiple health issues. She is also forgetting insulin doses especially mid-day >> discussed that this is important and may prevent the sugars from increasing to 400s before dinner. - today will increase the doses and may need to increase further >> advised her to let me know about the sugars if not improving in next mo - we cannot use a GLP1 R agonist 2/2 h/o elevated lipase on Victoza; we tried [facility] and this caused dizziness, fatigue, and yeast infections. - I advised her to:  Patient Instructions  Please increase: - Lantus 60 >> 70 units at night - Humalog:  b'fast 30 units >> 35  lunch 30 units >> 35  dinner 35 units If you plan  to be active after a particular meal, then decrease the insulin with that meal by 5-10 units.  Please come back for a follow-up appointment in 3 months.   - today, HbA1c is 8.6% (stable, high) - continue checking sugars at different times of the day - check 3x a day, rotating checks - advised for yearly eye exams >> she is UTD - will give her the flu shot today - Return to clinic in 3 mo with sugar log   2. CKD - + albuminuria - improving per last ACR  3. Obesity class 2 - weight a little lower - referred to nutrition at last visit >> saw Antonieta Iba - she die very well after this: feeling better and sugars also better, until her med intolerance as described in the HPI  Philemon Kingdom, MD PhD [facility] Endocrin[facility]

## 2016-12-25 ENCOUNTER — Other Ambulatory Visit: Payer: Self-pay | Admitting: Internal Medicine

## 2017-02-26 ENCOUNTER — Ambulatory Visit: Payer: Medicare Other | Admitting: Internal Medicine

## 2017-02-26 ENCOUNTER — Encounter: Payer: Self-pay | Admitting: Internal Medicine

## 2017-02-26 VITALS — BP 148/66 | HR 90 | Temp 98.1°F | Ht 63.5 in | Wt 199.6 lb

## 2017-02-26 DIAGNOSIS — N183 Chronic kidney disease, stage 3 unspecified: Secondary | ICD-10-CM

## 2017-02-26 DIAGNOSIS — E1122 Type 2 diabetes mellitus with diabetic chronic kidney disease: Secondary | ICD-10-CM | POA: Diagnosis not present

## 2017-02-26 DIAGNOSIS — E669 Obesity, unspecified: Secondary | ICD-10-CM | POA: Diagnosis not present

## 2017-02-26 DIAGNOSIS — Z794 Long term (current) use of insulin: Secondary | ICD-10-CM

## 2017-02-26 LAB — POCT GLYCOSYLATED HEMOGLOBIN (HGB A1C): HEMOGLOBIN A1C: 9.8

## 2017-02-26 MED ORDER — INSULIN LISPRO 100 UNIT/ML ~~LOC~~ SOLN: SUBCUTANEOUS | 5 refills | Status: DC

## 2017-02-26 MED ORDER — INSULIN GLARGINE 100 UNIT/ML ~~LOC~~ SOLN: SUBCUTANEOUS | 5 refills | Status: DC

## 2017-02-26 NOTE — Progress Notes (Signed)
Patient ID: Yolanda Saunders, female   DOB: [birth year], 72 y.o.   MRN: [id number]  HPI: Yolanda Saunders is a 72 y.o.-year-old female, returning for follow-up for DM2, dx 1999, insulin-dependent since ~2012, uncontrolled, with complications (CKD stage 3, h/o stroke post cardiac cath in 2002, macroalbuminuria). She saw Dr Buddy Duty in the past. Last visit with me 3 mo ago.  Since last visit, sugars are higher as she is usually more depressed during the winter.   Last hemoglobin A1c was: Lab Results  Component Value Date   HGBA1C 8.6 11/27/2016   HGBA1C 8.5 08/26/2016   HGBA1C 9.5 02/11/2016  07/31/2014: 13.4%   Pt could not afford the analog insulin products >> we changed to:  Insulin Before breakfast Before lunch Before dinner  Regular 25 - smaller meal 30 - larger meal 25 - smaller meal 30 - larger meal 25 - smaller meal 30 - larger meal  NPH 40  30   Now  back to Lantus and Humalog: - Lantus 70 units at night - Humalog:  b'fast 35 units  lunch 35 units  dinner 35 units If you plan to be active after a particular meal, then decrease the insulin with that meal by 5-10 units. We stopped Invokana 100 mg before b'fast >> lethargy, fatigue - in 11/2015. I suggested Bydureon 2 mg weekly - in donut hole >> could not start Tried Victoza >> helped, but AP She stopped Metformin ER 500 mg 2x a day because of AP, but does have IBS. She feels better after she stopped Tried Byetta >> N/V Tried Metformin >> AP, diarrhea, gas Tried SU >> upset stomach, nausea Has been on Invokana before >> yeast inf She had elevated lipase (72) in 09/2011 while on Victoza.  Pt check sugars frequently since last visit. 280s-310s: - am: 80, 90-271 >> 57, 124-244 >> 230s  >> 126-230 - 2h after b'fast192, 290 >> 67, 160 >> 258 >> n/c - before lunch 130-227 >> 65-174, 210 >> 85-191 >> n/c - 2h after lunch:  132-207 >> 66, 174-301 >> n/c - before dinner: 76, 120-175, 256 >> 101-171 >> 400s >> ? - 2h after  dinner:  216-427 (icecream) >> 200s >> ? - bedtime:187-241, 310 >> 72, 210-341  >> n/c - nighttime:  60, 66, 180>> 394 (skipped insulin) >> n/c Lowest sugar was: 74 >> 74 (when not eating).  she has hypoglycemia awareness in the 60s. Highest sugar was 400s >> 486.  Glucometer: Free Style  Pt's meals are: - Breakfast: oatmeal - Lunch: sandwich; chicken nuggets and fries - Dinner: heaviest: meat + 2 veggies + dessert later - Snacks: "too many" - cookies, sugary snacks  -+ CKD, last BUN/creatinine: Lab Results  Component Value Date   BUN 17 11/07/2016   CREATININE 1.10 (H) 11/07/2016   She has microalbuminuria: Lab Results  Component Value Date   MICRALBCREAT 32.0 (H) 02/11/2016  04/2014: ACR: >300 She saw a nephrologist in the past. She will see another nephrologist. She is intolerant to ace inhibitors and ARB's.  -+ HL; last set of lipids: 07/09/2016: 175/122/36/150 Lab Results  Component Value Date   CHOL 169 05/08/2015   HDL 38.60 (L) 05/08/2015   LDLCALC 108 (H) 05/08/2015   TRIG 116.0 05/08/2015   CHOLHDL 4 05/08/2015  On  Lipitor. - last eye exam was in 06/2016: + DR.  Has cataracts. -No numbness and tingling in her feet.  07/09/2016, TSH normal, at 3.92.  She is seeing Dr Narda Amber >>  investigation for disequllibrium and word difficulty. She also has HTN,  anemia, GERD.  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  ROS: Constitutional: no weight gain/no weight loss, no fatigue, no subjective hyperthermia, no subjective hypothermia Eyes: no blurry vision, no xerophthalmia ENT: no sore throat, no nodules palpated in throat, no dysphagia, no odynophagia, no hoarseness Cardiovascular: no CP/no SOB/no palpitations/no leg swelling Respiratory: no cough/no SOB/no wheezing Gastrointestinal: no N/no V/no D/no C/no acid reflux Musculoskeletal: no muscle aches/no joint aches Skin: no rashes, no hair loss Neurological: no tremors/no numbness/no tingling/no  dizziness  I reviewed pt's medications, allergies, PMH, social hx, family hx, and changes were documented in the history of present illness. Otherwise, unchanged from my initial visit note. On mm relaxer for bladder spasms (?)/.  Past Medical History:  Diagnosis Date  . Anemia 04/17/11   "long, long years ago"  . Angina 04/16/11   "that's what I'm here for"  . Anxiety   . Asthma   . Carpal tunnel syndrome   . Cataracts, bilateral   . CKD (chronic kidney disease), stage III ([place])   . Complication of anesthesia 1987   "affected my eyes; couldn't see anything but blurrs even the next day"  . CVA (cerebral infarction)    After cardiac catheter 02/2000  . Depression   . Edema   . Fibromyalgia   . GERD (gastroesophageal reflux disease)   . Headache(784.0)   . High cholesterol   . History of bronchitis   . Hypertension   . IBS (irritable bowel syndrome)   . Migraines   . Panic attacks 04/17/11   "don't take anything for it"  . Pneumonia 04/17/11   "probably as many as 7 times"  . Renal disorder 04/17/11   "they are working at 60% capacity"  . Shortness of breath on exertion    "cause of my asthma"  . Stroke [facility]) 2002   residual "problem w/using the right word, left 5 lesions on my brain/MRI; long term memory loss"  . Type II diabetes mellitus ([place])    Past Surgical History:  Procedure Laterality Date  . CARDIAC CATHETERIZATION  2002  . CARPAL TUNNEL RELEASE  2003-2010   "twice on left; once on right"  . CHOLECYSTECTOMY  1987  . DEBRIDEMENT TENNIS ELBOW  2010  . VAGINAL HYSTERECTOMY  1977   Social History   Social History  . Marital Status: Married   Social History Main Topics  . Smoking status: Former Smoker -- 0.75 packs/day for 6 years    Types: Cigarettes    Quit date: 01/21/1980  . Smokeless tobacco: Never Used  . Alcohol Use: No  . Drug Use: No   Social History Narrative   Lives with husband in a one story home.  Has 2 children.  Retired from [employer].   Education: 12th grade.  Trade schools.    Current Outpatient Medications on File Prior to Visit  Medication Sig Dispense Refill  . albuterol (PROVENTIL HFA;VENTOLIN HFA) 108 (90 BASE) MCG/ACT inhaler Inhale 2 puffs into the lungs every 6 (six) hours as needed. For shortness of breath    . aspirin EC 81 MG tablet Take 81 mg by mouth daily.    Marland Kitchen atorvastatin (LIPITOR) 40 MG tablet     . Blood Glucose Monitoring Suppl (ONETOUCH VERIO) w/Device KIT 1 each by Does not apply route daily. 1 kit 0  . furosemide (LASIX) 20 MG tablet Take 20 mg by mouth 2 (two) times daily.     Marland Kitchen  glucose blood (ONETOUCH VERIO) test strip Use as instructed to check sugar 3 times daily. 200 each 5  . insulin glargine (LANTUS) 100 UNIT/ML injection Inject 70 units at bedtime 20 vial 5  . insulin lispro (HUMALOG) 100 UNIT/ML injection Inject 20-25 units at breakfast, 30 units at lunch, and 34-36 units at dinner. 30 mL 5  . Insulin Syringe-Needle U-100 (RELION INSULIN SYRINGE) 31G X 15/64" 1 ML MISC Use 3 times daily.. 300 each 2  . LANTUS 100 UNIT/ML injection INJECT 60 UNITS SUBCUTANEOUSLY AT BEDTIME 20 mL 5  . ranitidine (ZANTAC) 300 MG tablet Take 300 mg by mouth 2 (two) times daily.    Marland Kitchen RELION INSULIN SYR 0.5ML/31G 31G X 5/16" 0.5 ML MISC USE THREE TIMES A DAY 200 each 5  . Syringe/Needle, Disp, (SYRINGE LUER LOCK) 23G X 1" 3 ML MISC 1 Package by Does not apply route as needed. 50 each 0  . Vitamin D, Cholecalciferol, 1000 UNITS TABS Take 2,000 Units by mouth daily.      No current facility-administered medications on file prior to visit.    Allergies  Allergen Reactions  . Codeine Other (See Comments)    "makes me crazy; see things; delusional"  . Erythromycin Other (See Comments)    "peeled skin; like a sunburn & I turn the color of the pill; a kind of purple-look"  . Lisinopril Cough    "cause I have asthma"  . Penicillins Rash and Other (See Comments)    "puffy blisters  . Sulfonamide Derivatives Hives     "watery hives"  . Metoprolol Diarrhea and Nausea And Vomiting    Rapid weight gain.  . Actos [Pioglitazone]     Upset GI  . Amlodipine     Syncope  . Benicar [Olmesartan] Other (See Comments)    Dizziness and HA  . Byetta 10 Mcg Pen [Exenatide] Nausea And Vomiting    5 MCG pen  . Glimepiride     Upset GI  . Glipizide     Upset GI  . Glyburide-Metformin     Myalgias  . Humalog [Insulin Lispro]     Headache  . Invokana [Canagliflozin]     Yeast infections  . Januvia [Sitagliptin]     uti  . Reglan [Metoclopramide]     unknown  . Septra [Sulfamethoxazole-Trimethoprim] Hives  . Spironolactone     unk  . Victoza [Liraglutide] Diarrhea    Abdominal pain   . Zocor [Simvastatin]     unknown  . Losartan Potassium Rash    Upset GI  . Novolin R [Insulin] Swelling and Rash    70/30   Family History  Problem Relation Age of Onset  . Coronary artery disease Father   . Diabetes Mellitus I Father   . CVA Mother   . Stroke Mother   . Diabetes Mellitus I Mother   . Diabetes Mellitus I Sister   . Diabetes Mellitus I Brother   . Diabetes Mellitus I Maternal Grandmother   . Diabetes Mellitus I Maternal Grandfather   . Diabetes Mellitus I Paternal Grandmother   . Diabetes Mellitus I Paternal Grandfather   . Diabetes Mellitus I Brother   . Aortic aneurysm Son    PE: BP (!) 148/66 (BP Location: Right Arm, Patient Position: Sitting, Cuff Size: Normal)   Pulse 90   Temp 98.1 F (36.7 C) (Oral)   Ht 5' 3.5" (1.613 m)   Wt 199 lb 9.6 oz (90.5 kg)   SpO2 99%   BMI  34.80 kg/m  Body mass index is 34.8 kg/m. Wt Readings from Last 3 Encounters:  02/26/17 199 lb 9.6 oz (90.5 kg)  11/27/16 196 lb (88.9 kg)  11/07/16 200 lb (90.7 kg)   Constitutional: overweight, in NAD Eyes: PERRLA, EOMI, no exophthalmos ENT: moist mucous membranes, no thyromegaly, no cervical lymphadenopathy Cardiovascular: RRR, No MRG, + l mild ower extremity edema bilaterally Respiratory: CTA  B Gastrointestinal: abdomen soft, NT, ND, BS+ Musculoskeletal: no deformities, strength intact in all 4 Skin: moist, warm, no rashes Neurological: no tremor with outstretched hands, DTR normal in all 4  ASSESSMENT: 1. DM2, insulin-dependent, uncontrolled, with complications - CKD stage 3 - h/o stroke post cardiac cath in 2002 - macroalbuminuria >> improved - Lipoatrophy at the site of insulin injections  2. Obesity class 2 BMI Classification:  < 18.5 underweight   18.5-24.9 normal weight   25.0-29.9 overweight   30.0-34.9 class I obesity   35.0-39.9 class II obesity   ? 40.0 class III obesity   PLAN:  1. Patient with long-standing, uncontrolled, type 2 diabetes, insulin-dependent, on basal-bolus insulin regimen with much higher sugars at this visit compared to last visit.  At last visit, she was forgetting insulin doses especially midday and we discussed about the fact that it is important not to miss them to gain good control of her diabetes.  We did increase the doses at last visit, also. - At this visit, her sugars are all high due to dysthymia during the winter.  She feels better during spring, usually.  - we discussed about ways to improve her diet - given specific suggestions, but will also increase her Lantus and split it into doses - We cannot use a GLP-1 receptor agonist due to elevated lipase on Victoza.  We tried Invokana but this caused dizziness, fatigue, and yeast infections.  We discussed that she is very insulin resistant and the only way to improve this short of gastric bypass is for her to switch to a more plant-based diet and reduce fatty foods. - I advised her to:  Patient Instructions  Please split: - Lantus 40 units 2x a day  Please continue: - Humalog:  b'fast 35 units  lunch 35 units  dinner 35 units If you plan to be active after a particular meal, then decrease the insulin with that meal by 5-10 units.  Please look up: Rip Esselstyn - The  Engine 2 diet   Rip Esselstyn - The Engine 2 7-day Rescue diet  Please come back for a follow-up appointment in 3 months.  - today, HbA1c is 9.8%, higher - continue checking sugars at different times of the day - check 3x a day, rotating checks - advised for yearly eye exams >> she is UTD - Return to clinic in 3 mo with sugar log   2. . Obesity class 2 - weight a little  higher - She is on nutrition before last visit.  However, she did not follow any diet plan since last visit.   - Discussed at length about diet and given examples of healthier foods and meals  Philemon Kingdom, MD PhD [facility] Endocrinology

## 2017-02-26 NOTE — Patient Instructions (Addendum)
Please split: - Lantus 40 units 2x a day  Please continue: - Humalog:  b'fast 35 units  lunch 35 units  dinner 35 units If you plan to be active after a particular meal, then decrease the insulin with that meal by 5-10 units.  Please look up: Rip Esselstyn - The Engine 2 diet   Rip Esselstyn - The Engine 2 7-day Rescue diet  Please come back for a follow-up appointment in 3 months.

## 2017-03-21 ENCOUNTER — Other Ambulatory Visit: Payer: Self-pay | Admitting: Internal Medicine

## 2017-04-20 ENCOUNTER — Other Ambulatory Visit: Payer: Self-pay | Admitting: Internal Medicine

## 2017-04-30 ENCOUNTER — Ambulatory Visit: Payer: Medicare Other | Admitting: Podiatry

## 2017-04-30 ENCOUNTER — Ambulatory Visit (INDEPENDENT_AMBULATORY_CARE_PROVIDER_SITE_OTHER): Payer: Medicare Other

## 2017-04-30 ENCOUNTER — Encounter: Payer: Self-pay | Admitting: Podiatry

## 2017-04-30 DIAGNOSIS — M722 Plantar fascial fibromatosis: Secondary | ICD-10-CM | POA: Diagnosis not present

## 2017-04-30 NOTE — Patient Instructions (Signed)
You can use Aspercreme with lidocaine to Biofreeze to your feet along the heels  If was nice to meet you today. If you have any questions or any further concerns, please feel fee to give me a call. You can call our office at [phone] or please feel fee to send me a message through MyChart.   ------  Plantar Fasciitis Rehab Ask your health care provider which exercises are safe for you. Do exercises exactly as told by your health care provider and adjust them as directed. It is normal to feel mild stretching, pulling, tightness, or discomfort as you do these exercises, but you should stop right away if you feel sudden pain or your pain gets worse. Do not begin these exercises until told by your health care provider. Stretching and range of motion exercises These exercises warm up your muscles and joints and improve the movement and flexibility of your foot. These exercises also help to relieve pain. Exercise A: Plantar fascia stretch  1. Sit with your left / right leg crossed over your opposite knee. 2. Hold your heel with one hand with that thumb near your arch. With your other hand, hold your toes and gently pull them back toward the top of your foot. You should feel a stretch on the bottom of your toes or your foot or both. 3. Hold this stretch for__________ seconds. 4. Slowly release your toes and return to the starting position. Repeat __________ times. Complete this exercise __________ times a day. Exercise B: Gastroc, standing  1. Stand with your hands against a wall. 2. Extend your left / right leg behind you, and bend your front knee slightly. 3. Keeping your heels on the floor and keeping your back knee straight, shift your weight toward the wall without arching your back. You should feel a gentle stretch in your left / right calf. 4. Hold this position for __________ seconds. Repeat __________ times. Complete this exercise __________ times a day. Exercise C: Soleus,  standing 1. Stand with your hands against a wall. 2. Extend your left / right leg behind you, and bend your front knee slightly. 3. Keeping your heels on the floor, bend your back knee and slightly shift your weight over the back leg. You should feel a gentle stretch deep in your calf. 4. Hold this position for __________ seconds. Repeat __________ times. Complete this exercise __________ times a day. Exercise D: Gastrocsoleus, standing 1. Stand with the ball of your left / right foot on a step. The ball of your foot is on the walking surface, right under your toes. 2. Keep your other foot firmly on the same step. 3. Hold onto the wall or a railing for balance. 4. Slowly lift your other foot, allowing your body weight to press your heel down over the edge of the step. You should feel a stretch in your left / right calf. 5. Hold this position for __________ seconds. 6. Return both feet to the step. 7. Repeat this exercise with a slight bend in your left / right knee. Repeat __________ times with your left / right knee straight and __________ times with your left / right knee bent. Complete this exercise __________ times a day. Balance exercise This exercise builds your balance and strength control of your arch to help take pressure off your plantar fascia. Exercise E: Single leg stand 1. Without shoes, stand near a railing or in a doorway. You may hold onto the railing or door frame as needed. 2.  Stand on your left / right foot. Keep your big toe down on the floor and try to keep your arch lifted. Do not let your foot roll inward. 3. Hold this position for __________ seconds. 4. If this exercise is too easy, you can try it with your eyes closed or while standing on a pillow. Repeat __________ times. Complete this exercise __________ times a day. This information is not intended to replace advice given to you by your health care provider. Make sure you discuss any questions you have with your  health care provider. Document Released: 01/06/2005 Document Revised: 09/11/2015 Document Reviewed: 11/20/2014 [employer] Interactive Patient Education  2018 [employer]

## 2017-04-30 NOTE — Progress Notes (Signed)
Subjective:   Patient ID: Yolanda Saunders, female   DOB: 72 y.o.   MRN: [id number]   HPI 72 year old female presents the office with concerns of pain in the bottoms of both of her heels which is been ongoing for about 3 months and the right side is worse than the left.  She states that she has pain in the morning she first gets up Is the worst it is starting to become more continuous throughout the day.  She has to walk on her tiptoes.  She denies any recent injury or trauma she denies any swelling or redness. She denies any claudication symptoms.  No numbness or tingling. She has a history of plantar fasciitis. She did try her old orthotics but they make it worse but hey are several years old. No other concerns.  Review of Systems  All other systems reviewed and are negative.  Past Medical History:  Diagnosis Date  . Anemia 04/17/11   "long, long years ago"  . Angina 04/16/11   "that's what I'm here for"  . Anxiety   . Asthma   . Carpal tunnel syndrome   . Cataracts, bilateral   . CKD (chronic kidney disease), stage III ([place])   . Complication of anesthesia 1987   "affected my eyes; couldn't see anything but blurrs even the next day"  . CVA (cerebral infarction)    After cardiac catheter 02/2000  . Depression   . Edema   . Fibromyalgia   . GERD (gastroesophageal reflux disease)   . Headache(784.0)   . High cholesterol   . History of bronchitis   . Hypertension   . IBS (irritable bowel syndrome)   . Migraines   . Panic attacks 04/17/11   "don't take anything for it"  . Pneumonia 04/17/11   "probably as many as 7 times"  . Renal disorder 04/17/11   "they are working at 60% capacity"  . Shortness of breath on exertion    "cause of my asthma"  . Stroke [facility]) 2002   residual "problem w/using the right word, left 5 lesions on my brain/MRI; long term memory loss"  . Type II diabetes mellitus ([place])     Past Surgical History:  Procedure Laterality Date  . CARDIAC CATHETERIZATION   2002  . CARPAL TUNNEL RELEASE  2003-2010   "twice on left; once on right"  . CHOLECYSTECTOMY  1987  . DEBRIDEMENT TENNIS ELBOW  2010  . VAGINAL HYSTERECTOMY  1977     Current Outpatient Medications:  .  albuterol (PROVENTIL HFA;VENTOLIN HFA) 108 (90 BASE) MCG/ACT inhaler, Inhale 2 puffs into the lungs every 6 (six) hours as needed. For shortness of breath, Disp: , Rfl:  .  aspirin EC 81 MG tablet, Take 81 mg by mouth daily., Disp: , Rfl:  .  atorvastatin (LIPITOR) 40 MG tablet, , Disp: , Rfl:  .  Blood Glucose Monitoring Suppl (ONETOUCH VERIO) w/Device KIT, 1 each by Does not apply route daily., Disp: 1 kit, Rfl: 0 .  furosemide (LASIX) 20 MG tablet, Take 20 mg by mouth 2 (two) times daily. , Disp: , Rfl:  .  insulin glargine (LANTUS) 100 UNIT/ML injection, INJECT 40 UNITS SUBCUTANEOUSLY 2x a day, Disp: 20 mL, Rfl: 5 .  insulin lispro (HUMALOG) 100 UNIT/ML injection, Inject 35 units before meals - 3x a day, Disp: 30 mL, Rfl: 5 .  insulin lispro (HUMALOG) 100 UNIT/ML injection, INJECT 20-25 UNITS AT BREAKFAST, 30 UNITS AT LUNCH, AND 34-36 UNITS AT [place], Disp:  30 mL, Rfl: 5 .  Insulin Syringe-Needle U-100 (RELION INSULIN SYRINGE) 31G X 15/64" 1 ML MISC, Use 3 times daily.., Disp: 300 each, Rfl: 2 .  ONETOUCH VERIO test strip, USE ONE STRIP TO CHECK GLUCOSE THREE TIMES DAILY, Disp: 200 each, Rfl: 5 .  ranitidine (ZANTAC) 300 MG tablet, Take 300 mg by mouth 2 (two) times daily., Disp: , Rfl:  .  RELION INSULIN SYR 0.5ML/31G 31G X 5/16" 0.5 ML MISC, USE THREE TIMES A DAY, Disp: 200 each, Rfl: 5 .  Syringe/Needle, Disp, (SYRINGE LUER LOCK) 23G X 1" 3 ML MISC, 1 Package by Does not apply route as needed., Disp: 50 each, Rfl: 0 .  Vitamin D, Cholecalciferol, 1000 UNITS TABS, Take 2,000 Units by mouth daily. , Disp: , Rfl:   Allergies  Allergen Reactions  . Codeine Other (See Comments)    "makes me crazy; see things; delusional"  . Erythromycin Other (See Comments)    "peeled skin; like a  sunburn & I turn the color of the pill; a kind of purple-look"  . Lisinopril Cough    "cause I have asthma"  . Penicillins Rash and Other (See Comments)    "puffy blisters  . Sulfonamide Derivatives Hives    "watery hives"  . Metoprolol Diarrhea and Nausea And Vomiting    Rapid weight gain.  . Actos [Pioglitazone]     Upset GI  . Amlodipine     Syncope  . Benicar [Olmesartan] Other (See Comments)    Dizziness and HA  . Byetta 10 Mcg Pen [Exenatide] Nausea And Vomiting    5 MCG pen  . Glimepiride     Upset GI  . Glipizide     Upset GI  . Glyburide-Metformin     Myalgias  . Humalog [Insulin Lispro]     Headache  . Invokana [Canagliflozin]     Yeast infections  . Januvia [Sitagliptin]     uti  . Reglan [Metoclopramide]     unknown  . Septra [Sulfamethoxazole-Trimethoprim] Hives  . Spironolactone     unk  . Victoza [Liraglutide] Diarrhea    Abdominal pain   . Zocor [Simvastatin]     unknown  . Losartan Potassium Rash    Upset GI  . Novolin R [Insulin] Swelling and Rash    70/30    Social History   Socioeconomic History  . Marital status: Married    Spouse name: Not on file  . Number of children: Not on file  . Years of education: Not on file  . Highest education level: Not on file  Occupational History  . Not on file  Social Needs  . Financial resource strain: Not on file  . Food insecurity:    Worry: Not on file    Inability: Not on file  . Transportation needs:    Medical: Not on file    Non-medical: Not on file  Tobacco Use  . Smoking status: Former Smoker    Packs/day: 0.75    Years: 6.00    Pack years: 4.50    Types: Cigarettes    Last attempt to quit: 01/21/1980    Years since quitting: 37.2  . Smokeless tobacco: Never Used  Substance and Sexual Activity  . Alcohol use: No    Alcohol/week: 0.0 oz  . Drug use: No  . Sexual activity: Not Currently  Lifestyle  . Physical activity:    Days per week: Not on file    Minutes per session: Not on  file  .  Stress: Not on file  Relationships  . Social connections:    Talks on phone: Not on file    Gets together: Not on file    Attends religious service: Not on file    Active member of club or organization: Not on file    Attends meetings of clubs or organizations: Not on file    Relationship status: Not on file  . Intimate partner violence:    Fear of current or ex partner: Not on file    Emotionally abused: Not on file    Physically abused: Not on file    Forced sexual activity: Not on file  Other Topics Concern  . Not on file  Social History Narrative   Lives with husband in a one story home.  Has 2 children.  Retired from [employer].  Education: 12th grade.  Trade schools.          Objective:  Physical Exam  General: AAO x3, NAD  Dermatological: Skin is warm, dry and supple bilateral. Nails x 10 are well manicured; remaining integument appears unremarkable at this time. There are no open sores, no preulcerative lesions, no rash or signs of infection present.  Vascular: Dorsalis Pedis artery and Posterior Tibial artery pedal pulses are 2/4 bilateral with immedate capillary fill time. Pedal hair growth present. No varicosities and no lower extremity edema present bilateral. There is no pain with calf compression, swelling, warmth, erythema.   Neruologic: Grossly intact via light touch bilateral. . Protective threshold with Semmes Wienstein monofilament intact to all pedal sites bilateral. Negative tinel sign   Musculoskeletal:Tenderness to palpation along the plantar medial tubercle of the calcaneus at the insertion of plantar fascia on the right > left foot. There is no pain along the course of the plantar fascia within the arch of the foot. Plantar fascia appears to be intact. There is no pain with lateral compression of the calcaneus or pain with vibratory sensation. Mild discomfort along the distal portion of the achilles tendon. Thompson test is negative. Achilles  tendon appears intact. No overlying edema, erythema, increase in warmth. No other areas of tenderness to bilateral lower extremities.  Muscular strength 5/5 in all groups tested bilateral.  Gait: Unassisted, Nonantalgic.       Assessment:   Bilateral plantar fasciitis.     Plan:  -Treatment options discussed including all alternatives, risks, and complications -Etiology of symptoms were discussed -X-rays were obtained and reviewed with the patient. No evidence of acute fracture or stress fracture identified at this time.  Vessel calcification is present. -I did an ABI in the office given vessel calcification.  The left was 1.05 and the right was 1.06. -We discussed stretching, icing exercises daily.  We discussed shoe gear modifications and orthotics.  She has orthotics at home she is in agreement with her to her next appointment will be to see if they are fitting or if we need to adjust them.  I dispensed bilateral plantar fascial brace is.  Discussed anti-inflammatories as needed.  Declines steroid injection.  Discussed other treatments including physical therapy, EPAT  Trula Slade DPM

## 2017-05-15 ENCOUNTER — Other Ambulatory Visit: Payer: Self-pay | Admitting: Family Medicine

## 2017-05-15 DIAGNOSIS — R945 Abnormal results of liver function studies: Principal | ICD-10-CM

## 2017-05-15 DIAGNOSIS — R1031 Right lower quadrant pain: Secondary | ICD-10-CM

## 2017-05-15 DIAGNOSIS — R7989 Other specified abnormal findings of blood chemistry: Secondary | ICD-10-CM

## 2017-05-22 ENCOUNTER — Ambulatory Visit
Admission: RE | Admit: 2017-05-22 | Discharge: 2017-05-22 | Disposition: A | Discharge status: Home / Self Care | Payer: Medicare Other | Source: Ambulatory Visit | Attending: Family Medicine | Admitting: Family Medicine

## 2017-05-22 DIAGNOSIS — R1031 Right lower quadrant pain: Secondary | ICD-10-CM

## 2017-05-28 ENCOUNTER — Ambulatory Visit: Payer: Medicare Other | Admitting: Internal Medicine

## 2017-05-28 ENCOUNTER — Encounter: Payer: Self-pay | Admitting: Internal Medicine

## 2017-05-28 VITALS — BP 132/80 | HR 73 | Ht 63.5 in | Wt 197.8 lb

## 2017-05-28 DIAGNOSIS — E1122 Type 2 diabetes mellitus with diabetic chronic kidney disease: Secondary | ICD-10-CM

## 2017-05-28 DIAGNOSIS — N183 Chronic kidney disease, stage 3 unspecified: Secondary | ICD-10-CM

## 2017-05-28 DIAGNOSIS — Z794 Long term (current) use of insulin: Secondary | ICD-10-CM | POA: Diagnosis not present

## 2017-05-28 DIAGNOSIS — E785 Hyperlipidemia, unspecified: Secondary | ICD-10-CM

## 2017-05-28 DIAGNOSIS — E669 Obesity, unspecified: Secondary | ICD-10-CM

## 2017-05-28 LAB — POCT GLYCOSYLATED HEMOGLOBIN (HGB A1C): HEMOGLOBIN A1C: 8.4

## 2017-05-28 NOTE — Patient Instructions (Addendum)
Please continue: - Lantus 40 units 2x a day - Humalog:  b'fast 35 units  Lunch 35 units  dinner 35 units If you plan to be active after a particular meal, then decrease the insulin with that meal by 5-10 units.  If you change to Regular insulin, use the same doses as for Humalog but inject 30 min before a meal.  Please come back for a follow-up appointment in 3-4 months.

## 2017-05-28 NOTE — Progress Notes (Signed)
Patient ID: Yolanda Saunders, female   DOB: 11/28/1945, 72 y.o.   MRN: [id number]  HPI: Yolanda Saunders is a 72 y.o.-year-old female, returning for follow-up for DM2, dx 1999, insulin-dependent since ~2012, uncontrolled, with complications (CKD stage 3, h/o stroke post cardiac cath in 2002, macroalbuminuria). She saw Dr Buddy Duty in the past. Last visit with me 3 months ago.  She was sick in last 3 mo >> pancreatitis.  She has a lot of pain and sugars are more fluctuating.  Last hemoglobin A1c was: Lab Results  Component Value Date   HGBA1C 9.8 02/26/2017   HGBA1C 8.6 11/27/2016   HGBA1C 8.5 08/26/2016  07/31/2014: 13.4%   In the past, she had a period in which she could not afford analog insulin products  >> we changed to:  Insulin Before breakfast Before lunch Before dinner  Regular 25 - smaller meal 30 - larger meal 25 - smaller meal 30 - larger meal 25 - smaller meal 30 - larger meal  NPH 40  30   Now  back to Lantus and Humalog: - Lantus 40 units 2x a day - Humalog: >> HAs from Humalog  b'fast 35 units  Lunch 35 units  dinner 35 units If you plan to be active after a particular meal, then decrease the insulin with that meal by 5-10 units. We stopped Invokana 100 mg before b'fast >> lethargy, fatigue -  in 11/2015 I suggested Bydureon 2 mg weekly - in donut hole >> could not start Tried Victoza >> helped, but AP She stopped Metformin ER 500 mg 2x a day because of AP, but does have IBS. She feels better after she stopped Tried Byetta >> N/V Tried Metformin >> AP, diarrhea, gas Tried SU >> upset stomach, nausea Has been on Invokana before >> yeast inf She had elevated lipase (72) in 09/2011 while on Victoza.  Patient checks sugars 3 times a day: - am: 57, 124-244 >> 230s  >> 126-230 >> 121-281 - 2h after b'fast: 67, 160 >> 258 >> n/c >> 248 - before lunch: 65-174, 210 >> 85-191 >> n/c >> 70-242 - 2h after lunch:  132-207 >> 66, 174-301 >> n/c - before dinner:101-171 >>  400s >> n/c >>66-228, 284 - 2h after dinner:  216-427 (icecream) >> 200s >> 162-279 - bedtime:187-241, 310 >> 72, 210-341  >> n/c >> 96-319, 389 - nighttime:  60, 66, 180>> 394 (skipped insulin) >> n/c >> 77-277 Lowest sugar was: 65 >> 74 (when not eating) >> 44 x1 (took too much insulin), 58.  she has hypoglycemia awareness in the 60s. Highest sugar was 400s >> 486 >> 380.   Glucometer: Free Style  Pt's meals are: - Breakfast: oatmeal - Lunch: sandwich; chicken nuggets and fries - Dinner: heaviest: meat + 2 veggies + dessert later - Snacks: "too many" - cookies, sugary snacks  -+ CKD, last BUN/creatinine: Lab Results  Component Value Date   BUN 17 11/07/2016   CREATININE 1.10 (H) 11/07/2016   She has microalbuminuria: Lab Results  Component Value Date   MICRALBCREAT 32.0 (H) 02/11/2016  04/2014: ACR: >300 She sees nephrology. She is intolerant to ACE inhibitors and ARB's.  -+ HL; last set of lipids: 07/09/2016: 175/122/36/150 Lab Results  Component Value Date   CHOL 169 05/08/2015   HDL 38.60 (L) 05/08/2015   LDLCALC 108 (H) 05/08/2015   TRIG 116.0 05/08/2015   CHOLHDL 4 05/08/2015  On Lipitor. - last eye exam was in 06/2016: + DR.  Has  cataracts. -No numbness and tingling in her feet.  07/09/2016, TSH normal, at 3.92.  She is seeing Dr Narda Amber >> investigation for disequllibrium and word difficulty. She also has HTN,  anemia, GERD.  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  ROS: Constitutional: no weight gain/no weight loss, no fatigue, no subjective hyperthermia, no subjective hypothermia Eyes: no blurry vision, no xerophthalmia ENT: no sore throat, no nodules palpated in throat, no dysphagia, no odynophagia, no hoarseness Cardiovascular: no CP/no SOB/no palpitations/no leg swelling Respiratory: no cough/no SOB/no wheezing Gastrointestinal: + N/+ V/no D/no C/no acid reflux, + AP Musculoskeletal: no muscle aches/no joint aches Skin: no rashes, no hair  loss Neurological: no tremors/no numbness/no tingling/no dizziness, + HA  I reviewed pt's medications, allergies, PMH, social hx, family hx, and changes were documented in the history of present illness. Otherwise, unchanged from my initial visit note.  Past Medical History:  Diagnosis Date  . Anemia 04/17/11   "long, long years ago"  . Angina 04/16/11   "that's what I'm here for"  . Anxiety   . Asthma   . Carpal tunnel syndrome   . Cataracts, bilateral   . CKD (chronic kidney disease), stage III ([place])   . Complication of anesthesia 1987   "affected my eyes; couldn't see anything but blurrs even the next day"  . CVA (cerebral infarction)    After cardiac catheter 02/2000  . Depression   . Edema   . Fibromyalgia   . GERD (gastroesophageal reflux disease)   . Headache(784.0)   . High cholesterol   . History of bronchitis   . Hypertension   . IBS (irritable bowel syndrome)   . Migraines   . Panic attacks 04/17/11   "don't take anything for it"  . Pneumonia 04/17/11   "probably as many as 7 times"  . Renal disorder 04/17/11   "they are working at 60% capacity"  . Shortness of breath on exertion    "cause of my asthma"  . Stroke [facility]) 2002   residual "problem w/using the right word, left 5 lesions on my brain/MRI; long term memory loss"  . Type II diabetes mellitus ([place])    Past Surgical History:  Procedure Laterality Date  . CARDIAC CATHETERIZATION  2002  . CARPAL TUNNEL RELEASE  2003-2010   "twice on left; once on right"  . CHOLECYSTECTOMY  1987  . DEBRIDEMENT TENNIS ELBOW  2010  . VAGINAL HYSTERECTOMY  1977   Social History   Social History  . Marital Status: Married   Social History Main Topics  . Smoking status: Former Smoker -- 0.75 packs/day for 6 years    Types: Cigarettes    Quit date: 01/21/1980  . Smokeless tobacco: Never Used  . Alcohol Use: No  . Drug Use: No   Social History Narrative   Lives with husband in a one story home.  Has 2 children.   Retired from [employer].  Education: 12th grade.  Trade schools.    Current Outpatient Medications on File Prior to Visit  Medication Sig Dispense Refill  . albuterol (PROVENTIL HFA;VENTOLIN HFA) 108 (90 BASE) MCG/ACT inhaler Inhale 2 puffs into the lungs every 6 (six) hours as needed. For shortness of breath    . aspirin EC 81 MG tablet Take 81 mg by mouth daily.    Marland Kitchen atorvastatin (LIPITOR) 40 MG tablet     . Blood Glucose Monitoring Suppl (ONETOUCH VERIO) w/Device KIT 1 each by Does not apply route daily. 1 kit  0  . furosemide (LASIX) 20 MG tablet Take 20 mg by mouth 2 (two) times daily.     . insulin glargine (LANTUS) 100 UNIT/ML injection INJECT 40 UNITS SUBCUTANEOUSLY 2x a day 20 mL 5  . insulin lispro (HUMALOG) 100 UNIT/ML injection Inject 35 units before meals - 3x a day 30 mL 5  . insulin lispro (HUMALOG) 100 UNIT/ML injection INJECT 20-25 UNITS AT BREAKFAST, 30 UNITS AT LUNCH, AND 34-36 UNITS AT DINNER 30 mL 5  . Insulin Syringe-Needle U-100 (RELION INSULIN SYRINGE) 31G X 15/64" 1 ML MISC Use 3 times daily.. 300 each 2  . ONETOUCH VERIO test strip USE ONE STRIP TO CHECK GLUCOSE THREE TIMES DAILY 200 each 5  . ranitidine (ZANTAC) 300 MG tablet Take 300 mg by mouth 2 (two) times daily.    Marland Kitchen RELION INSULIN SYR 0.5ML/31G 31G X 5/16" 0.5 ML MISC USE THREE TIMES A DAY 200 each 5  . Syringe/Needle, Disp, (SYRINGE LUER LOCK) 23G X 1" 3 ML MISC 1 Package by Does not apply route as needed. 50 each 0  . Vitamin D, Cholecalciferol, 1000 UNITS TABS Take 2,000 Units by mouth daily.      No current facility-administered medications on file prior to visit.    Allergies  Allergen Reactions  . Codeine Other (See Comments)    "makes me crazy; see things; delusional"  . Erythromycin Other (See Comments)    "peeled skin; like a sunburn & I turn the color of the pill; a kind of purple-look"  . Lisinopril Cough    "cause I have asthma"  . Penicillins Rash and Other (See Comments)    "puffy  blisters  . Sulfonamide Derivatives Hives    "watery hives"  . Metoprolol Diarrhea and Nausea And Vomiting    Rapid weight gain.  . Actos [Pioglitazone]     Upset GI  . Amlodipine     Syncope  . Benicar [Olmesartan] Other (See Comments)    Dizziness and HA  . Byetta 10 Mcg Pen [Exenatide] Nausea And Vomiting    5 MCG pen  . Glimepiride     Upset GI  . Glipizide     Upset GI  . Glyburide-Metformin     Myalgias  . Humalog [Insulin Lispro]     Headache  . Invokana [Canagliflozin]     Yeast infections  . Januvia [Sitagliptin]     uti  . Reglan [Metoclopramide]     unknown  . Septra [Sulfamethoxazole-Trimethoprim] Hives  . Spironolactone     unk  . Victoza [Liraglutide] Diarrhea    Abdominal pain   . Zocor [Simvastatin]     unknown  . Losartan Potassium Rash    Upset GI  . Novolin R [Insulin] Swelling and Rash    70/30   Family History  Problem Relation Age of Onset  . Coronary artery disease Father   . Diabetes Mellitus I Father   . CVA Mother   . Stroke Mother   . Diabetes Mellitus I Mother   . Diabetes Mellitus I Sister   . Diabetes Mellitus I Brother   . Diabetes Mellitus I Maternal Grandmother   . Diabetes Mellitus I Maternal Grandfather   . Diabetes Mellitus I Paternal Grandmother   . Diabetes Mellitus I Paternal Grandfather   . Diabetes Mellitus I Brother   . Aortic aneurysm Son    PE: BP 132/80   Pulse 73   Ht 5' 3.5" (1.613 m)   Wt 197 lb 12.8 oz (89.7  kg)   SpO2 98%   BMI 34.49 kg/m  Body mass index is 34.49 kg/m. Wt Readings from Last 3 Encounters:  05/28/17 197 lb 12.8 oz (89.7 kg)  02/26/17 199 lb 9.6 oz (90.5 kg)  11/27/16 196 lb (88.9 kg)   Constitutional: overweight, in NAD Eyes: PERRLA, EOMI, no exophthalmos ENT: moist mucous membranes, no thyromegaly, no cervical lymphadenopathy Cardiovascular: RRR, No MRG Respiratory: CTA B Gastrointestinal: abdomen soft, NT, ND, BS+ Musculoskeletal: no deformities, strength intact in all  4 Skin: moist, warm, no rashes Neurological: no tremor with outstretched hands, DTR normal in all 4  ASSESSMENT: 1. DM2, insulin-dependent, uncontrolled, with complications - CKD stage 3 - h/o stroke post cardiac cath in 2002 - macroalbuminuria >> improved - Lipoatrophy at the site of insulin injections  2. Obesity class 2 BMI Classification:  < 18.5 underweight   18.5-24.9 normal weight   25.0-29.9 overweight   30.0-34.9 class I obesity   35.0-39.9 class II obesity   ? 40.0 class III obesity   3. HL  PLAN:  1. Patient with long-standing, uncontrolled, type 2 diabetes, insulin-dependent, on basal-bolus insulin regimen, with higher sugars at last visit due to dysthymia during winter.  She was also missing some doses of insulin.  At that time, we discussed about improving diet and I also advised her to increase Lantus and split it in 2 doses. -We cannot use a GLP-1 receptor agonist due to history of pancreatitis.  We tried Invokana but this caused dizziness, fatigue, and yeast infections.  At last visit, we discussed about gastric bypass versus a plant-based diet.  Given examples and recommendations for the latter. -At this visit, sugars are very fluctuating possibly also because of her not being able to eat well and having nausea and abdominal pain.  We again discussed about improving diet by eliminating fatty foods.  She would like to start this especially since this is also recommended for her pancreatitis. -We also discussed about switching from Humalog to our insulin due to the headaches that she develops with Humalog, however, for now, she would like to remain on the analog insulin.  We discussed about differences between Humalog and regular insulin in terms of dosing.  I advised her that she can get this from [facility] without prescription, if she decides to use it. -Otherwise, we will not change the doses of her insulin regimen due to too much variability in her CBGs.  Improving  her diet will greatly help. - I advised her to:  Patient Instructions  Please continue: - Lantus 40 units 2x a day - Humalog:  b'fast 35 units  Lunch 35 units  dinner 35 units If you plan to be active after a particular meal, then decrease the insulin with that meal by 5-10 units.  If you change to Regular insulin, use the same doses as for Humalog but inject 30 min before a meal.  Please come back for a follow-up appointment in 3-4 months.   - today, HbA1c is 8.4% (better) - continue checking sugars at different times of the day - check 3x a day, rotating checks - advised for yearly eye exams >> she is UTD - Return to clinic in 3-4 mo with sugar log   2. . Obesity class 2 - Discussed about food choices and the need of a diet at last visit.  Given examples of healthier foods and meals.  She mentions that she and her husband are eating healthier - Weight is stable.  3. HL -  Reviewed latest lipid panel from 06/2016: LDL very high - Continues Lipitor without side effects.  Philemon Kingdom, MD PhD [facility] Endocrinology

## 2017-06-01 ENCOUNTER — Encounter: Payer: Self-pay | Admitting: Podiatry

## 2017-06-01 ENCOUNTER — Ambulatory Visit: Payer: Medicare Other | Admitting: Podiatry

## 2017-06-01 DIAGNOSIS — M722 Plantar fascial fibromatosis: Secondary | ICD-10-CM | POA: Diagnosis not present

## 2017-06-03 DIAGNOSIS — M722 Plantar fascial fibromatosis: Secondary | ICD-10-CM | POA: Insufficient documentation

## 2017-06-03 NOTE — Progress Notes (Signed)
Subjective: 72 year old female presents the office today for follow-up evaluation of bilateral heel pain with the right side worse than the left.  She states that her pain is about the same.  She states the braces are too tight however does help.  She states that she still gets pain in the morning when she first gets up and after being on her feet.  No numbness or tingling.  She points to the bottom of the heel which is the majority of symptoms.Denies any systemic complaints such as fevers, chills, nausea, vomiting. No acute changes since last appointment, and no other complaints at this time.   Objective: AAO x3, NAD DP/PT pulses palpable bilaterally, CRT less than 3 seconds There is continuation of tenderness on the plantar medial tubercle of the calcaneus at the insertion of plantar fascia bilaterally with the right side worse than left.  Biceps appears to be intact.  There is no pain with lateral compression of calcaneus.  Achilles tendon appears to be intact.  There is no overlying edema, erythema, increase in warmth.  No other areas of tenderness. No open lesions or pre-ulcerative lesions.  No pain with calf compression, swelling, warmth, erythema  Assessment: Bilateral heel pain right worse than left  Plan: -All treatment options discussed with the patient including all alternatives, risks, complications.  -Today we discussed a steroid injection and she wished to proceed on the right side only.  This was performed today.  See procedure note below.  Continue stretching, icing exercises daily.  Discussed supportive shoes.  Also discussed other treatments including physical therapy, EPAT. -Patient encouraged to call the office with any questions, concerns, change in symptoms.   Procedure: Injection Tendon/Ligament Discussed alternatives, risks, complications and verbal consent was obtained.  Location: Right plantar fascia at the glabrous junction; medial approach. Skin Prep:  Alcohol. Injectate: 0.5cc 0.5% marcaine plain, 0.5 cc 2% lidocaine plain and, 1 cc kenalog 10. Disposition: Patient tolerated procedure well. Injection site dressed with a band-aid.  Post-injection care was discussed and return precautions discussed.   Vivi Barrack DPM

## 2017-06-30 ENCOUNTER — Ambulatory Visit: Payer: Medicare Other | Admitting: Podiatry

## 2017-06-30 DIAGNOSIS — M722 Plantar fascial fibromatosis: Secondary | ICD-10-CM

## 2017-06-30 MED ORDER — TRIAMCINOLONE ACETONIDE 10 MG/ML IJ SUSP
10.0000 mg | Freq: Once | INTRAMUSCULAR | Status: AC
  Administered 2017-06-30: 10 mg

## 2017-07-02 NOTE — Progress Notes (Signed)
Subjective: 72 year old female presents the office today for follow-up evaluation of bilateral heel pain with the right side worse than the left.  She states that she was doing better she had about a week if no pain after the injection.  The pain started to come back.  She is been stretching ice as much as possible.  No recent injury or trauma denies any swelling or redness.  She has no other concerns today.   Objective: AAO x3, NAD DP/PT pulses palpable bilaterally, CRT less than 3 seconds There is continuation of tenderness on the plantar medial tubercle of the calcaneus at the insertion of plantar fascia bilaterally with the right side worse than left.  The plantar fascia appears to be intact.  Overall her pain has improved some but does continue.  There is no pain with lateral compression of calcaneus.  Achilles tendon appears to be intact.  There is no overlying edema, erythema, increase in warmth.  No other areas of tenderness. No open lesions or pre-ulcerative lesions.  No pain with calf compression, swelling, warmth, erythema  Assessment: Bilateral heel pain right worse than left  Plan: -All treatment options discussed with the patient including all alternatives, risks, complications.  -Today we discussed a steroid injection and she wished to proceed on the right side only.  This is her second injection.  This was performed today.  See procedure note below.  Continue stretching, icing exercises daily.  Discussed supportive shoes.  She declines physical therapy and other treatments and will continue with this for now. -Patient encouraged to call the office with any questions, concerns, change in symptoms.   Procedure: Injection Tendon/Ligament Discussed alternatives, risks, complications and verbal consent was obtained.  Location: Right plantar fascia at the glabrous junction; medial approach. Skin Prep: Alcohol. Injectate: 0.5cc 0.5% marcaine plain, 0.5 cc 2% lidocaine plain and, 1 cc  kenalog 10. Disposition: Patient tolerated procedure well. Injection site dressed with a band-aid.  Post-injection care was discussed and return precautions discussed.   Vivi BarrackMatthew R Wagoner DPM

## 2017-08-11 ENCOUNTER — Ambulatory Visit: Payer: Medicare Other | Admitting: Podiatry

## 2017-09-25 ENCOUNTER — Ambulatory Visit: Payer: Medicare Other | Admitting: Internal Medicine

## 2017-09-25 ENCOUNTER — Encounter: Payer: Self-pay | Admitting: Internal Medicine

## 2017-09-25 VITALS — BP 130/64 | HR 75 | Temp 97.7°F | Ht 63.5 in | Wt 203.0 lb

## 2017-09-25 DIAGNOSIS — E785 Hyperlipidemia, unspecified: Secondary | ICD-10-CM

## 2017-09-25 DIAGNOSIS — E1122 Type 2 diabetes mellitus with diabetic chronic kidney disease: Secondary | ICD-10-CM

## 2017-09-25 DIAGNOSIS — N183 Chronic kidney disease, stage 3 unspecified: Secondary | ICD-10-CM

## 2017-09-25 DIAGNOSIS — E669 Obesity, unspecified: Secondary | ICD-10-CM | POA: Diagnosis not present

## 2017-09-25 DIAGNOSIS — Z794 Long term (current) use of insulin: Secondary | ICD-10-CM

## 2017-09-25 LAB — POCT GLYCOSYLATED HEMOGLOBIN (HGB A1C): Hemoglobin A1C: 9.1 % — AB (ref 4.0–5.6)

## 2017-09-25 NOTE — Patient Instructions (Addendum)
  Try to write comments for unusual sugars.  Please change: - Lantus 30 units in am and 50 units at bedtime.  Please continue: - Humalog:  b'fast 35 units  lunch 35 units  dinner 35 units If you plan to be active after a particular meal, then decrease the insulin with that meal by 5-10 units.  Try to take 5-10 units if you have a snack after dinner.  Please come back for a follow-up appointment in 3-4 months.

## 2017-09-25 NOTE — Progress Notes (Signed)
Patient ID: Yolanda Saunders, female   DOB: 14-Dec-1945, 72 y.o.   MRN: [id number]  HPI: Yolanda Saunders is a 72 y.o.-year-old female, returning for follow-up for DM2, dx 1999, insulin-dependent since ~2012, uncontrolled, with complications (CKD stage 3, h/o stroke post cardiac cath in 2002, macroalbuminuria). She saw Dr Buddy Duty in the past. Last visit with me 4 months ago.  She still has foot pain and loss of balance.  She is very stressed -she takes care of her husband who has a history of seizures and she has to watch him all the time.   Last hemoglobin A1c was: Lab Results  Component Value Date   HGBA1C 8.4 05/28/2017   HGBA1C 9.8 02/26/2017   HGBA1C 8.6 11/27/2016  07/31/2014: 13.4%   In the past, she had a period in which she could not afford analog insulin products  >> we changed to:  Insulin Before breakfast Before lunch Before dinner  Regular 25 - smaller meal 30 - larger meal 25 - smaller meal 30 - larger meal 25 - smaller meal 30 - larger meal  NPH 40  30   Now  back to Lantus and Humalog: - Lantus 40 units 2x a day - Humalog: >> but has HA with this.  At last visit, I recommended to try regular insulin, but she did not have a chance to try this. 35 units before B,L,D If you plan to be active after a particular meal, then decrease the insulin with that meal by 5-10 units. We stopped Invokana 100 mg before b'fast >> lethargy, fatigue -  in 11/2015 I suggested Bydureon 2 mg weekly - in donut hole >> could not start Tried Victoza >> helped, but AP She stopped Metformin ER 500 mg 2x a day because of AP, but does have IBS. She feels better after she stopped Tried Byetta >> N/V Tried Metformin >> AP, diarrhea, gas Tried SU >> upset stomach, nausea Has been on Invokana before >> yeast inf She had elevated lipase (72) in 09/2011 while on Victoza.  She also reportedly had pancreatitis at the beginning of 2019.  Patient checks sugars 1-2x a day -reviewed her log: - am: 126-230  >> 121-281 >> 116-321 - 2h after b'fast:  258 >> n/c >> 248 >> 58, 214-265 - before lunch:85-191 >> n/c >> 70-242 >> 58, 95-228, 246 - 2h after lunch:  132-207 >> 66, 174-301 >> 160 - before dinner:n/c >>66-228, 284 >> 68, 273 - 2h after dinner:200s >> 162-279 >> n/c >> 175-307 - bedtime: >> n/c >> 96-319, 389 >> see below >> 106-307 - nighttime:  394 >> n/c >> 77-277 >> 321, 332 >> 252-349 Lowest sugar was: 44 x1 (took too much insulin), 58 >> 116.  she has hypoglycemia awareness in the 60s Highest sugar was 380 >> 349.   Glucometer: Free Style  Pt's meals are: - Breakfast: oatmeal - Lunch: sandwich; chicken nuggets and fries - Dinner: heaviest: meat + 2 veggies + dessert later - Snacks: "too many" - cookies, sugary snacks  -+ CKD, last BUN/creatinine: Lab Results  Component Value Date   BUN 17 11/07/2016   CREATININE 1.10 (H) 11/07/2016   + MAU: Lab Results  Component Value Date   MICRALBCREAT 32.0 (H) 02/11/2016  04/2014: ACR: >300 She sees nephrology. Intolerant to ACEI/ARBs.  -+ HL; last set of lipids: 07/09/2016: 175/122/36/150 Lab Results  Component Value Date   CHOL 169 05/08/2015   HDL 38.60 (L) 05/08/2015   LDLCALC 108 (H) 05/08/2015  TRIG 116.0 05/08/2015   CHOLHDL 4 05/08/2015  On Lipitor. - last eye exam was in 06/2016: + DR.  Has cataracts. - no numbness and tingling in her feet.  07/09/2016, TSH normal, at 3.92. Lab Results  Component Value Date   TSH 2.337 06/06/2014   She is seeing Dr Narda Amber >> investigation for disequllibrium and word difficulty. She also has HTN,  anemia, GERD.  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  ROS: Constitutional: no weight gain/no weight loss, + fatigue, + subjective hyperthermia, + subjective hypothermia, + nocturia Eyes: + Blurry vision, no xerophthalmia ENT: no sore throat, no nodules palpated in throat, no dysphagia, no odynophagia, no hoarseness Cardiovascular: no CP/no SOB/no palpitations/+ leg  swelling Respiratory: no cough/no SOB/no wheezing Gastrointestinal: no N/no V/no D/+ C/no acid reflux Musculoskeletal:+ muscle aches/+ joint aches Skin: no rashes, + hair loss Neurological: no tremors/no numbness/no tingling/no dizziness, + headache  I reviewed pt's medications, allergies, PMH, social hx, family hx, and changes were documented in the history of present illness. Otherwise, unchanged from my initial visit note.  Past Medical History:  Diagnosis Date  . Anemia 04/17/11   "long, long years ago"  . Angina 04/16/11   "that's what I'm here for"  . Anxiety   . Asthma   . Carpal tunnel syndrome   . Cataracts, bilateral   . CKD (chronic kidney disease), stage III ([place])   . Complication of anesthesia 1987   "affected my eyes; couldn't see anything but blurrs even the next day"  . CVA (cerebral infarction)    After cardiac catheter 02/2000  . Depression   . Edema   . Fibromyalgia   . GERD (gastroesophageal reflux disease)   . Headache(784.0)   . High cholesterol   . History of bronchitis   . Hypertension   . IBS (irritable bowel syndrome)   . Migraines   . Panic attacks 04/17/11   "don't take anything for it"  . Pneumonia 04/17/11   "probably as many as 7 times"  . Renal disorder 04/17/11   "they are working at 60% capacity"  . Shortness of breath on exertion    "cause of my asthma"  . Stroke [facility]) 2002   residual "problem w/using the right word, left 5 lesions on my brain/MRI; long term memory loss"  . Type II diabetes mellitus ([place])    Past Surgical History:  Procedure Laterality Date  . CARDIAC CATHETERIZATION  2002  . CARPAL TUNNEL RELEASE  2003-2010   "twice on left; once on right"  . CHOLECYSTECTOMY  1987  . DEBRIDEMENT TENNIS ELBOW  2010  . VAGINAL HYSTERECTOMY  1977   Social History   Social History  . Marital Status: Married   Social History Main Topics  . Smoking status: Former Smoker -- 0.75 packs/day for 6 years    Types: Cigarettes    Quit  date: 01/21/1980  . Smokeless tobacco: Never Used  . Alcohol Use: No  . Drug Use: No   Social History Narrative   Lives with husband in a one story home.  Has 2 children.  Retired from [employer].  Education: 12th grade.  Trade schools.    Current Outpatient Medications on File Prior to Visit  Medication Sig Dispense Refill  . albuterol (PROVENTIL HFA;VENTOLIN HFA) 108 (90 BASE) MCG/ACT inhaler Inhale 2 puffs into the lungs every 6 (six) hours as needed. For shortness of breath    . aspirin EC 81 MG tablet Take 81 mg by  mouth daily.    Marland Kitchen atorvastatin (LIPITOR) 40 MG tablet     . Blood Glucose Monitoring Suppl (ONETOUCH VERIO) w/Device KIT 1 each by Does not apply route daily. 1 kit 0  . furosemide (LASIX) 20 MG tablet Take 20 mg by mouth 2 (two) times daily.     . insulin glargine (LANTUS) 100 UNIT/ML injection INJECT 40 UNITS SUBCUTANEOUSLY 2x a day 20 mL 5  . insulin lispro (HUMALOG) 100 UNIT/ML injection Inject 35 units before meals - 3x a day 30 mL 5  . insulin lispro (HUMALOG) 100 UNIT/ML injection INJECT 20-25 UNITS AT BREAKFAST, 30 UNITS AT LUNCH, AND 34-36 UNITS AT DINNER 30 mL 5  . Insulin Syringe-Needle U-100 (RELION INSULIN SYRINGE) 31G X 15/64" 1 ML MISC Use 3 times daily.. 300 each 2  . ONETOUCH VERIO test strip USE ONE STRIP TO CHECK GLUCOSE THREE TIMES DAILY 200 each 5  . ranitidine (ZANTAC) 300 MG tablet Take 300 mg by mouth 2 (two) times daily.    Marland Kitchen RELION INSULIN SYR 0.5ML/31G 31G X 5/16" 0.5 ML MISC USE THREE TIMES A DAY 200 each 5  . Syringe/Needle, Disp, (SYRINGE LUER LOCK) 23G X 1" 3 ML MISC 1 Package by Does not apply route as needed. 50 each 0  . Vitamin D, Cholecalciferol, 1000 UNITS TABS Take 2,000 Units by mouth daily.      No current facility-administered medications on file prior to visit.    Allergies  Allergen Reactions  . Codeine Other (See Comments)    "makes me crazy; see things; delusional"  . Erythromycin Other (See Comments)    "peeled skin;  like a sunburn & I turn the color of the pill; a kind of purple-look"  . Lisinopril Cough    "cause I have asthma"  . Penicillins Rash and Other (See Comments)    "puffy blisters  . Sulfonamide Derivatives Hives    "watery hives"  . Metoprolol Diarrhea and Nausea And Vomiting    Rapid weight gain.  . Actos [Pioglitazone]     Upset GI  . Amlodipine     Syncope  . Benicar [Olmesartan] Other (See Comments)    Dizziness and HA  . Byetta 10 Mcg Pen [Exenatide] Nausea And Vomiting    5 MCG pen  . Glimepiride     Upset GI  . Glipizide     Upset GI  . Glyburide-Metformin     Myalgias  . Humalog [Insulin Lispro]     Headache  . Invokana [Canagliflozin]     Yeast infections  . Januvia [Sitagliptin]     uti  . Reglan [Metoclopramide]     unknown  . Septra [Sulfamethoxazole-Trimethoprim] Hives  . Spironolactone     unk  . Victoza [Liraglutide] Diarrhea    Abdominal pain   . Zocor [Simvastatin]     unknown  . Losartan Potassium Rash    Upset GI  . Novolin R [Insulin] Swelling and Rash    70/30   Family History  Problem Relation Age of Onset  . Coronary artery disease Father   . Diabetes Mellitus I Father   . CVA Mother   . Stroke Mother   . Diabetes Mellitus I Mother   . Diabetes Mellitus I Sister   . Diabetes Mellitus I Brother   . Diabetes Mellitus I Maternal Grandmother   . Diabetes Mellitus I Maternal Grandfather   . Diabetes Mellitus I Paternal Grandmother   . Diabetes Mellitus I Paternal Grandfather   . Diabetes  Mellitus I Brother   . Aortic aneurysm Son    PE: BP 130/64 (BP Location: Left Arm, Patient Position: Sitting, Cuff Size: Large)   Pulse 75   Temp 97.7 F (36.5 C) (Oral)   Ht 5' 3.5" (1.613 m)   Wt 203 lb (92.1 kg)   SpO2 98%   BMI 35.40 kg/m  Body mass index is 35.4 kg/m. Wt Readings from Last 3 Encounters:  09/25/17 203 lb (92.1 kg)  05/28/17 197 lb 12.8 oz (89.7 kg)  02/26/17 199 lb 9.6 oz (90.5 kg)   Constitutional: overweight, in  NAD Eyes: PERRLA, EOMI, no exophthalmos ENT: moist mucous membranes, no thyromegaly, no cervical lymphadenopathy Cardiovascular: RRR, No MRG Respiratory: CTA B Gastrointestinal: abdomen soft, NT, ND, BS+ Musculoskeletal: no deformities, strength intact in all 4 Skin: moist, warm, no rashes Neurological: no tremor with outstretched hands, DTR normal in all 4  ASSESSMENT: 1. DM2, insulin-dependent, uncontrolled, with complications - CKD stage 3 - h/o stroke post cardiac cath in 2002 - macroalbuminuria >> improved - Lipoatrophy at the site of insulin injections  2. Obesity class 2 BMI Classification:  < 18.5 underweight   18.5-24.9 normal weight   25.0-29.9 overweight   30.0-34.9 class I obesity   35.0-39.9 class II obesity   ? 40.0 class III obesity   3. HL  PLAN:  1. Patient with long-standing, uncontrolled, type 2 diabetes, insulin-dependent, on basal-bolus insulin regimen with very fluctuating blood sugars at last visit due to not being able to eat well and having nausea and abdominal pain from pancreatitis.  We discussed about the need to eliminate fatty foods and overall to improve her diet.  She was also complaining about headaches with Humalog and I recommended regular insulin before meals.  We did not change the doses of her insulins due to the variability in her CBGs. - We cannot use a GLP-1 receptor agonist due to history of pancreatitis.  We tried Invokana but this caused dizziness, fatigue, yeast infections.  We discussed in the past about gastric bypass versus a plant-based diet.  She would not want to go ahead with a gastric bypass.  She did not start the plant-based diet. - Sugars continue to be very variable and unfortunately she has many hyperglycemic spikes but also some normal or slightly even low blood sugars, in the upper 50s or 60s. - We discussed about redistributing Lantus with a higher dose at bedtime so that her sugars in the morning are better, however,  unfortunately, there are not many other changes in her regimen that we can safely do at this point.  We discussed at length about trying to eat meals about the same time each day for breakfast, lunch, dinner, which is something that she does not do.  I also advised her to take a small bolus of insulin before a snack, if she snacks at night.  She usually stays late at night and a snack after dinner.  We also discussed about the fact that she would benefit from a more structured meal plan and I suggested weight watchers.  She has information about the program at home and has tried it in the past.  The biggest problem for her is staying in the kitchen and cooking when she has weakness in her legs and fatigue.  We discussed about batch cooking.  She will try to get back to weight watchers.  I also advised her to write comments whenever she feels that her sugars are out of the ordinary. -  I advised her to:  Patient Instructions   Try to write comments for unusual sugars.  Please change: - Lantus 30 units in am and 50 units at bedtime.  Please continue: - Humalog:  b'fast 35 units  lunch 35 units  dinner 35 units If you plan to be active after a particular meal, then decrease the insulin with that meal by 5-10 units.  Try to take 5-10 units if you have a snack after dinner.  Please come back for a follow-up appointment in 3-4 months.  - today, HbA1c is 9.1% (higher) - continue checking sugars at different times of the day - check 3x a day, rotating checks - advised for yearly eye exams >> she is not UTD - She had a flu shot 2 days ago - Return to clinic in 3-4 mo with sugar log   2. . Obesity class 2 -Weight is higher at this visit.  Recommended weight watchers  3. HL - Reviewed latest lipid panel from a year ago: LDL very high - Continues Lipitor without side effects. - she is due for another lipid panel - For now-start the diet and we we will to check this at next visit.  Philemon Kingdom, MD PhD [facility] Endocrin[facility]

## 2017-10-08 ENCOUNTER — Telehealth: Payer: Self-pay

## 2017-10-08 NOTE — Telephone Encounter (Signed)
Notified patient of message from Dr. Gherghe, patient expressed understanding and agreement. No further questions.  

## 2017-10-08 NOTE — Telephone Encounter (Signed)
Patient called today to make MD aware that the kidney doctor started her on labetalol and since then she has had extremely high blood sugar  Tuesday  208 am 224 lunch 189 dinner 341 bedtime  Wednesday  255 am 161 lunch 283 dinner 394 bedtime  Today  260 am  All of the readings are before meals and bedtime please advise

## 2017-10-08 NOTE — Telephone Encounter (Signed)
Please advise her to change her regimen: - Lantus 30 >> 40 units in am and 50  units at bedtime. - Humalog:  b'fast 35 units >> 38 units  lunch 35 units >> 38 units  dinner 35 units >> 40-42 units Please not ask her to let us know how the sugars are doing after the weekend.

## 2017-10-12 NOTE — Telephone Encounter (Signed)
Patient calling back with sugar number:  9/20 Before breakfast 234 Before lunch 186 Before dinner 52 Before bed 286  9/21  Before breakfast 150 Before lunch 177 Before dinner 102 Before bed 139  9/22 Before breakfast 134 Before lunch 229 Before bed 336  9/23  Before breakfast 122 Before lunch 158

## 2017-10-12 NOTE — Telephone Encounter (Signed)
Sugars are quite variable but some of them have improved.  Due to the variability, I would not suggest any changes for now.  She needs to make notes why the sugars go up, for example the day when she had to 336.

## 2017-10-13 NOTE — Telephone Encounter (Signed)
Notified patient of message from Dr. Gherghe, patient expressed understanding and agreement. No further questions.  

## 2017-10-19 ENCOUNTER — Other Ambulatory Visit: Payer: Self-pay | Admitting: Internal Medicine

## 2017-12-18 ENCOUNTER — Other Ambulatory Visit: Payer: Self-pay | Admitting: Internal Medicine

## 2018-01-27 ENCOUNTER — Ambulatory Visit: Payer: Medicare Other | Admitting: Internal Medicine

## 2018-04-13 ENCOUNTER — Other Ambulatory Visit: Payer: Self-pay | Admitting: Internal Medicine

## 2018-04-22 ENCOUNTER — Ambulatory Visit: Payer: Medicare Other | Admitting: Internal Medicine

## 2018-05-15 ENCOUNTER — Other Ambulatory Visit: Payer: Self-pay | Admitting: Internal Medicine

## 2018-06-04 ENCOUNTER — Telehealth: Payer: Self-pay | Admitting: Internal Medicine

## 2018-06-04 NOTE — Telephone Encounter (Signed)
LM with pt husband to call back to reschedule appt for Dr Elvera Lennox - No Show/cancellation list.

## 2018-06-15 ENCOUNTER — Other Ambulatory Visit: Payer: Self-pay | Admitting: Internal Medicine

## 2018-07-16 ENCOUNTER — Other Ambulatory Visit: Payer: Self-pay | Admitting: Internal Medicine

## 2018-08-13 ENCOUNTER — Other Ambulatory Visit: Payer: Self-pay | Admitting: Internal Medicine

## 2018-09-22 ENCOUNTER — Other Ambulatory Visit: Payer: Self-pay

## 2018-09-22 ENCOUNTER — Telehealth: Payer: Self-pay | Admitting: Internal Medicine

## 2018-09-22 NOTE — Telephone Encounter (Signed)
Patient stated that she would to speak with someone in regards to her insulin HUMALOG 100 UNIT/ML injection. States it is not working and her sugars are running very high. Also, states she is having problems at the pharmacy. Please call patient before appointment on Friday.  Please Advise, Thanks

## 2018-09-22 NOTE — Telephone Encounter (Signed)
Left message for patient to return our call at [phone].  

## 2018-09-23 NOTE — Telephone Encounter (Signed)
Left message for patient to return our call at [phone].  

## 2018-09-24 ENCOUNTER — Ambulatory Visit: Payer: Medicare Other | Admitting: Internal Medicine

## 2018-09-24 ENCOUNTER — Encounter: Payer: Self-pay | Admitting: Internal Medicine

## 2018-09-24 ENCOUNTER — Other Ambulatory Visit: Payer: Self-pay

## 2018-09-24 VITALS — BP 160/80 | HR 78 | Ht 63.5 in | Wt 199.0 lb

## 2018-09-24 DIAGNOSIS — E1122 Type 2 diabetes mellitus with diabetic chronic kidney disease: Secondary | ICD-10-CM

## 2018-09-24 DIAGNOSIS — Z794 Long term (current) use of insulin: Secondary | ICD-10-CM

## 2018-09-24 DIAGNOSIS — N183 Chronic kidney disease, stage 3 unspecified: Secondary | ICD-10-CM

## 2018-09-24 DIAGNOSIS — E785 Hyperlipidemia, unspecified: Secondary | ICD-10-CM

## 2018-09-24 LAB — LDL CHOLESTEROL, DIRECT: Direct LDL: 144 mg/dL

## 2018-09-24 LAB — POCT GLYCOSYLATED HEMOGLOBIN (HGB A1C): Hemoglobin A1C: 9.5 % — AB (ref 4.0–5.6)

## 2018-09-24 LAB — LIPID PANEL
Cholesterol: 193 mg/dL (ref 0–200)
HDL: 36.3 mg/dL — ABNORMAL LOW (ref >39.00)
NonHDL: 156.36
Total CHOL/HDL Ratio: 5
Triglycerides: 221 mg/dL — ABNORMAL HIGH (ref 0.0–149.0)
VLDL: 44.2 mg/dL — ABNORMAL HIGH (ref 0.0–40.0)

## 2018-09-24 NOTE — Telephone Encounter (Signed)
Patient was seen today.

## 2018-09-24 NOTE — Progress Notes (Signed)
Patient ID: Yolanda Saunders, female   DOB: 03/02/1945, 73 y.o.   MRN: [id number]  HPI: Yolanda Saunders is a 73 y.o.-year-old female, returning for follow-up for DM2, dx 1999, insulin-dependent since ~2012, uncontrolled, with complications (CKD stage 3, h/o stroke post cardiac cath in 2002, macroalbuminuria). She saw Dr Buddy Duty in the past. Last visit with me 1 year ago.  She continues to be very stressed-she takes care of her husband who has a history of seizures and she has to watch him all the time. He just had an AMI >> was in ICU.  She has had very high blood sugars since last visit 200-300s, even spiking in the 400-600s! In the last week, she ran out of Humalog and her sugars improved significantly!!!  She was describing headaches with Humalog in the past and I suggested to switch to regular insulin multiple times in the past but she did not do so yet.  Last hemoglobin A1c was: Lab Results  Component Value Date   HGBA1C 9.1 (A) 09/25/2017   HGBA1C 8.4 05/28/2017   HGBA1C 9.8 02/26/2017  07/31/2014: 13.4%   In the past, she had a period in which she could not afford analog insulin products  >> we changed to:  Insulin Before breakfast Before lunch Before dinner  Regular 25 - smaller meal 30 - larger meal 25 - smaller meal 30 - larger meal 25 - smaller meal 30 - larger meal  NPH 40  30   Now  back to Lantus and Humalog: Please continue: - Lantus 40 units in am and 50  units at bedtime. - Humalog: >> HAs.  Now off Humalog completely for 1 week. Marland Kitchen   b'fast 38 units  lunch 38 units  dinner 40 to 42 units Try to take 5-10 units if you have a snack after dinner.  Please come back for a follow-up appointment in 3-4 months. We stopped Invokana 100 mg before b'fast >> lethargy, fatigue -  in 11/2015 I suggested Bydureon 2 mg weekly - in donut hole >> could not start Tried Victoza >> helped, but AP She stopped Metformin ER 500 mg 2x a day because of AP, but does have IBS. She feels  better after she stopped Tried Byetta >> N/V Tried Metformin >> AP, diarrhea, gas Tried SU >> upset stomach, nausea Has been on Invokana before >> yeast inf She had elevated lipase (72) in 09/2011 while on Victoza.  She also reportedly had pancreatitis at the beginning of 2019.  Patient checks sugars 3-4 times a day reviewing her log >> better after stopping Humalog-see below!: - am: 126-230 >> 121-281 >> 116-321 >> 53, 83-280 - 2h after b'fast:  258 >> n/c >> 248 >> 58, 214-265 >> 218 - before lunch:85-191 >> n/c >> 70-242 >> 58, 95-228, 246>> 62-144 - 2h after lunch:  132-207 >> 66, 174-301 >> 160 >> 285 - before dinner:n/c >>66-228, 284 >> 68, 273 >> 257, 285 - 2h after dinner:200s >> 162-279 >> n/c >> 175-307 >> 223 - bedtime: >> n/c >> 96-319, 389 >> see below >> 106-307 >> 196, 311 - nighttime:  394 >> n/c >> 77-277 >> 321, 332 >> 252-349 Lowest sugar was: 44 x1 (took too much insulin), 58 >> 116 >> 53 .  she has hypoglycemia awareness in the 60s Highest sugar was 380 >> 349 >> 311 after stopping HUmalog, 600s on Humalog.   Glucometer: Free Styl  Pt's meals are: - Breakfast: oatmeal - Lunch: sandwich; chicken  nuggets and fries - Dinner: heaviest: meat + 2 veggies + dessert later - Snacks: "too many" - cookies, sugary snacks  -+ CKD, last BUN/creatinine: Lab Results  Component Value Date   BUN 17 11/07/2016   CREATININE 1.10 (H) 11/07/2016   + MAU: Lab Results  Component Value Date   MICRALBCREAT 32.0 (H) 02/11/2016  04/2014: ACR: >300 She sees nephrology. Intolerant to ACEI/ARBs.  -+ HL; last set of lipids: 07/09/2016: 175/122/36/150 Lab Results  Component Value Date   CHOL 169 05/08/2015   HDL 38.60 (L) 05/08/2015   LDLCALC 108 (H) 05/08/2015   TRIG 116.0 05/08/2015   CHOLHDL 4 05/08/2015  On Lipitor. - last eye exam was in 06/2017: + DR.+ cataracts -She denies numbness and tingling in her feet.  07/09/2016: TSH normal, at 3.92. Lab Results  Component  Value Date   TSH 2.337 06/06/2014   She is seeing Dr Narda Amber >> investigation for disequllibrium and word difficulty. ? MS. She also has HTN,  anemia, GERD.  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  ROS: Constitutional: no weight gain/no weight loss, no fatigue, no subjective hyperthermia, no subjective hypothermia Eyes: no blurry vision, no xerophthalmia ENT: no sore throat, no nodules palpated in neck, no dysphagia, no odynophagia, no hoarseness Cardiovascular: no CP/no SOB/no palpitations/no leg swelling Respiratory: no cough/no SOB/no wheezing Gastrointestinal: no N/no V/no D/no C/no acid reflux Musculoskeletal: + muscle aches/+ joint aches Skin: no rashes, no hair loss Neurological: no tremors/no numbness/no tingling/no dizziness, + imbalance  I reviewed pt's medications, allergies, PMH, social hx, family hx, and changes were documented in the history of present illness. Otherwise, unchanged from my initial visit note.  Past Medical History:  Diagnosis Date  . Anemia 04/17/11   "long, long years ago"  . Angina 04/16/11   "that's what I'm here for"  . Anxiety   . Asthma   . Carpal tunnel syndrome   . Cataracts, bilateral   . CKD (chronic kidney disease), stage III ([place])   . Complication of anesthesia 1987   "affected my eyes; couldn't see anything but blurrs even the next day"  . CVA (cerebral infarction)    After cardiac catheter 02/2000  . Depression   . Edema   . Fibromyalgia   . GERD (gastroesophageal reflux disease)   . Headache(784.0)   . High cholesterol   . History of bronchitis   . Hypertension   . IBS (irritable bowel syndrome)   . Migraines   . Panic attacks 04/17/11   "don't take anything for it"  . Pneumonia 04/17/11   "probably as many as 7 times"  . Renal disorder 04/17/11   "they are working at 60% capacity"  . Shortness of breath on exertion    "cause of my asthma"  . Stroke [facility]) 2002   residual "problem w/using the right word, left 5 lesions  on my brain/MRI; long term memory loss"  . Type II diabetes mellitus ([place])    Past Surgical History:  Procedure Laterality Date  . CARDIAC CATHETERIZATION  2002  . CARPAL TUNNEL RELEASE  2003-2010   "twice on left; once on right"  . CHOLECYSTECTOMY  1987  . DEBRIDEMENT TENNIS ELBOW  2010  . VAGINAL HYSTERECTOMY  1977   Social History   Social History  . Marital Status: Married   Social History Main Topics  . Smoking status: Former Smoker -- 0.75 packs/day for 6 years    Types: Cigarettes    Quit date: 01/21/1980  .  Smokeless tobacco: Never Used  . Alcohol Use: No  . Drug Use: No   Social History Narrative   Lives with husband in a one story home.  Has 2 children.  Retired from [employer].  Education: 12th grade.  Trade schools.    Current Outpatient Medications on File Prior to Visit  Medication Sig Dispense Refill  . albuterol (PROVENTIL HFA;VENTOLIN HFA) 108 (90 BASE) MCG/ACT inhaler Inhale 2 puffs into the lungs every 6 (six) hours as needed. For shortness of breath    . aspirin EC 81 MG tablet Take 81 mg by mouth daily.    Marland Kitchen atorvastatin (LIPITOR) 40 MG tablet     . Blood Glucose Monitoring Suppl (ONETOUCH VERIO) w/Device KIT 1 each by Does not apply route daily. 1 kit 0  . furosemide (LASIX) 20 MG tablet Take 20 mg by mouth 2 (two) times daily.     Marland Kitchen HUMALOG 100 UNIT/ML injection INJECT 20-25 UNITS SUBCUTANEOUSLY AT BREAKFAST, 30 UNITS AT LUNCH, AND 34-36 UNITS AT DINNER 30 mL 2  . insulin glargine (LANTUS) 100 UNIT/ML injection INJECT 40 UNITS SUBCUTANEOUSLY 2x a day 20 mL 5  . insulin lispro (HUMALOG) 100 UNIT/ML injection Inject 35 units before meals - 3x a day 30 mL 5  . Insulin Syringe-Needle U-100 (RELION INSULIN SYRINGE) 31G X 15/64" 1 ML MISC Use 3 times daily.. 300 each 2  . LANTUS 100 UNIT/ML injection INJECT 60 UNITS SUBCUTANEOUSLY AT BEDTIME 20 mL 0  . ONETOUCH VERIO test strip USE ONE STRIP TO CHECK GLUCOSE THREE TIMES DAILY 200 each 5  . ranitidine  (ZANTAC) 300 MG tablet Take 300 mg by mouth 2 (two) times daily.    Marland Kitchen RELION INSULIN SYR 0.5ML/31G 31G X 5/16" 0.5 ML MISC USE THREE TIMES A DAY 200 each 5  . Syringe/Needle, Disp, (SYRINGE LUER LOCK) 23G X 1" 3 ML MISC 1 Package by Does not apply route as needed. 50 each 0  . Vitamin D, Cholecalciferol, 1000 UNITS TABS Take 2,000 Units by mouth daily.      No current facility-administered medications on file prior to visit.    Allergies  Allergen Reactions  . Codeine Other (See Comments)    "makes me crazy; see things; delusional"  . Erythromycin Other (See Comments)    "peeled skin; like a sunburn & I turn the color of the pill; a kind of purple-look"  . Lisinopril Cough    "cause I have asthma"  . Penicillins Rash and Other (See Comments)    "puffy blisters  . Sulfonamide Derivatives Hives    "watery hives"  . Metoprolol Diarrhea and Nausea And Vomiting    Rapid weight gain.  . Actos [Pioglitazone]     Upset GI  . Amlodipine     Syncope  . Benicar [Olmesartan] Other (See Comments)    Dizziness and HA  . Byetta 10 Mcg Pen [Exenatide] Nausea And Vomiting    5 MCG pen  . Glimepiride     Upset GI  . Glipizide     Upset GI  . Glyburide-Metformin     Myalgias  . Humalog [Insulin Lispro]     Headache  . Invokana [Canagliflozin]     Yeast infections  . Januvia [Sitagliptin]     uti  . Reglan [Metoclopramide]     unknown  . Septra [Sulfamethoxazole-Trimethoprim] Hives  . Spironolactone     unk  . Victoza [Liraglutide] Diarrhea    Abdominal pain   . Zocor [Simvastatin]  unknown  . Losartan Potassium Rash    Upset GI  . Novolin R [Insulin] Swelling and Rash    70/30   Family History  Problem Relation Age of Onset  . Coronary artery disease Father   . Diabetes Mellitus I Father   . CVA Mother   . Stroke Mother   . Diabetes Mellitus I Mother   . Diabetes Mellitus I Sister   . Diabetes Mellitus I Brother   . Diabetes Mellitus I Maternal Grandmother   .  Diabetes Mellitus I Maternal Grandfather   . Diabetes Mellitus I Paternal Grandmother   . Diabetes Mellitus I Paternal Grandfather   . Diabetes Mellitus I Brother   . Aortic aneurysm Son    PE: BP (!) 160/80   Pulse 78   Ht 5' 3.5" (1.613 m) Comment: measured  Wt 199 lb (90.3 kg)   SpO2 97%   BMI 34.70 kg/m  Body mass index is 34.7 kg/m. Wt Readings from Last 3 Encounters:  09/24/18 199 lb (90.3 kg)  09/25/17 203 lb (92.1 kg)  05/28/17 197 lb 12.8 oz (89.7 kg)   Constitutional: overweight, in NAD Eyes: PERRLA, EOMI, no exophthalmos ENT: moist mucous membranes, no thyromegaly, no cervical lymphadenopathy Cardiovascular: RRR, No MRG Respiratory: CTA B Gastrointestinal: abdomen soft, NT, ND, BS+ Musculoskeletal: no deformities, strength intact in all 4 Skin: moist, warm, no rashes Neurological: no tremor with outstretched hands, DTR normal in all 4  ASSESSMENT: 1. DM2, insulin-dependent, uncontrolled, with complications - CKD stage 3 - h/o stroke post cardiac cath in 2002 - macroalbuminuria >> improved - Lipoatrophy at the site of insulin injections  2. Obesity class 2 BMI Classification:  < 18.5 underweight   18.5-24.9 normal weight   25.0-29.9 overweight   30.0-34.9 class I obesity   35.0-39.9 class II obesity   ? 40.0 class III obesity   3. HL  PLAN:  1. Patient with longstanding, uncontrolled, type 2 diabetes, insulin-dependent, on basal-bolus insulin regimen with very fluctuating blood sugars.  We cannot use a GLP-1 receptor agonist due to history of pancreatitis.  We tried Invokana but this caused dizziness, fatigue, yeast infections.  We discussed in the past about gastric bypass versus a plant-based diet but she did not want to implement any of these.  I suggested weight watchers at last visit.  She did not start.  At that time, sugars are very variable with sugars in the 50s or 60s alternating with many hypoglycemic spikes so we were not able to adjust her  insulin regimen.  Since last visit, she called [place] with high blood sugars so we increased both Lantus and Humalog doses.  At last visit I also advised her to take a small dose of Humalog before a snack.  We also discussed about different options for healthier meals including batch cooking, a more structured meal plan and consistent eating times. -At this visit, she reports a very high blood sugar since last visit, mostly in the 200s to 300s, however, spiking in the 400s to 600s until approximately 1 week ago when she ran out of Humalog due to the pharmacy not being able to dispense this to her.  Note, she had been Humalog in the past (headaches) but this was the only insulin approved by her insurance and she opted to continue this and not use, as I suggested. however, since she stopped Humalog sugars actually significantly to the point of lows.  She still has higher blood sugars after meals so she will  need some form of mealtime coverage.  I again suggested regular insulin and directed her to  [facility] where she can pick it up without prescription.  She agrees to start this.  We will do so at the lower dose compared to her previous Humalog dose.  I advised her to inject this 30 minutes before meals, which she was actually doing when she was using Humalog.  Also, since her sugars are low in the morning (53 this morning), will reduce her Lantus dose at night.  I advised her to let me know she experiences any more low blood sugars or if the sugars start to increase. - I advised her to:  Patient Instructions  Please change: - Lantus 40 units in am and 40 units at bedtime.  Please do not restart Humalog.  Please start: - Regular (ReliOn):  b'fast 15-20 units  lunch 15-20 units  dinner 15-25 units Try to take 5-10 units if you have a snack after dinner.  Please come back for a follow-up appointment in 3 months.  - we checked her HbA1c: 9.5% (higher) - advised to check sugars at different times of the  day - 3x a day, rotating check times - advised for yearly eye exams >> she is due - We will check a CMP now  - return to clinic in 3-4 months   2. . Obesity class 2 -At last visit I recommended to start weight watchers - not doing this -At this visit, she lost 4 pounds from the previous visit  3. HL - Reviewed latest lipid panel from 2019: LDL very high - Continues Lipitor without side effects. -She is due for another lipid panel now  Component     Latest Ref Rng & Units 09/24/2018  Glucose     65 - 99 mg/dL 198 (H)  BUN     7 - 25 mg/dL 17  Creatinine     0.60 - 0.93 mg/dL 1.24 (H)  GFR, Est Non African American     > OR = 60 mL/min/1.30m 42 (L)  GFR, Est African American     > OR = 60 mL/min/1.733m49 (L)  BUN/Creatinine Ratio     6 - 22 (calc) 14  Sodium     135 - 146 mmol/L 142  Potassium     3.5 - 5.3 mmol/L 4.3  Chloride     98 - 110 mmol/L 108  CO2     20 - 32 mmol/L 27  Calcium     8.6 - 10.4 mg/dL 9.7  Total Protein     6.1 - 8.1 g/dL 6.0 (L)  Albumin MSPROF     3.6 - 5.1 g/dL 3.8  Globulin     1.9 - 3.7 g/dL (calc) 2.2  AG Ratio     1.0 - 2.5 (calc) 1.7  Total Bilirubin     0.2 - 1.2 mg/dL 0.4  Alkaline phosphatase (APISO)     37 - 153 U/L 55  AST     10 - 35 U/L 12  ALT     6 - 29 U/L 16  Cholesterol     0 - 200 mg/dL 193  Triglycerides     0.0 - 149.0 mg/dL 221.0 (H)  HDL Cholesterol     >39.00 mg/dL 36.30 (L)  VLDL     0.0 - 40.0 mg/dL 44.2 (H)  Total CHOL/HDL Ratio      5  NonHDL      156.36  Direct LDL  mg/dL 144.0   LDL only slightly better.  Triglycerides are higher.  Improving her diabetes most likely her lipids, also. Glucose high.  Kidney function slightly lower. I will advise her to stay well-hydrated.  Philemon Kingdom, MD PhD [facility]

## 2018-09-24 NOTE — Patient Instructions (Addendum)
Please change: - Lantus 40 units in am and 40 units at bedtime.  Please do not restart Humalog.  Please start: - Regular (ReliOn):  b'fast 15-20 units  lunch 15-20 units  dinner 15-25 units Try to take 5-10 units if you have a snack after dinner.  Please come back for a follow-up appointment in 3 months.

## 2018-09-25 LAB — COMPLETE METABOLIC PANEL WITH GFR
AG Ratio: 1.7 (calc) (ref 1.0–2.5)
ALT: 16 U/L (ref 6–29)
AST: 12 U/L (ref 10–35)
Albumin: 3.8 g/dL (ref 3.6–5.1)
Alkaline phosphatase (APISO): 55 U/L (ref 37–153)
BUN/Creatinine Ratio: 14 (calc) (ref 6–22)
BUN: 17 mg/dL (ref 7–25)
CO2: 27 mmol/L (ref 20–32)
Calcium: 9.7 mg/dL (ref 8.6–10.4)
Chloride: 108 mmol/L (ref 98–110)
Creat: 1.24 mg/dL — ABNORMAL HIGH (ref 0.60–0.93)
GFR, Est African American: 49 mL/min/{1.73_m2} — ABNORMAL LOW (ref >=60)
GFR, Est Non African American: 42 mL/min/{1.73_m2} — ABNORMAL LOW (ref >=60)
Globulin: 2.2 g/dL (calc) (ref 1.9–3.7)
Glucose, Bld: 198 mg/dL — ABNORMAL HIGH (ref 65–99)
Potassium: 4.3 mmol/L (ref 3.5–5.3)
Sodium: 142 mmol/L (ref 135–146)
Total Bilirubin: 0.4 mg/dL (ref 0.2–1.2)
Total Protein: 6 g/dL — ABNORMAL LOW (ref 6.1–8.1)

## 2018-09-28 ENCOUNTER — Other Ambulatory Visit: Payer: Self-pay | Admitting: Internal Medicine

## 2018-09-30 ENCOUNTER — Ambulatory Visit: Payer: Medicare Other | Admitting: Internal Medicine

## 2018-12-14 ENCOUNTER — Other Ambulatory Visit: Payer: Self-pay | Admitting: Internal Medicine

## 2018-12-24 ENCOUNTER — Ambulatory Visit: Payer: Medicare Other | Admitting: Internal Medicine

## 2019-02-17 ENCOUNTER — Other Ambulatory Visit: Payer: Self-pay | Admitting: Internal Medicine

## 2019-11-21 HISTORY — PX: EYE SURGERY: SHX253

## 2020-02-20 DIAGNOSIS — M25551 Pain in right hip: Secondary | ICD-10-CM | POA: Diagnosis not present

## 2020-02-20 DIAGNOSIS — I10 Essential (primary) hypertension: Secondary | ICD-10-CM | POA: Diagnosis not present

## 2020-02-27 DIAGNOSIS — N183 Chronic kidney disease, stage 3 unspecified: Secondary | ICD-10-CM | POA: Diagnosis not present

## 2020-03-05 ENCOUNTER — Other Ambulatory Visit: Payer: Self-pay | Admitting: Orthopedic Surgery

## 2020-03-05 DIAGNOSIS — M545 Low back pain, unspecified: Secondary | ICD-10-CM

## 2020-03-05 DIAGNOSIS — M25551 Pain in right hip: Secondary | ICD-10-CM

## 2020-03-08 DIAGNOSIS — E559 Vitamin D deficiency, unspecified: Secondary | ICD-10-CM | POA: Diagnosis not present

## 2020-03-08 DIAGNOSIS — M797 Fibromyalgia: Secondary | ICD-10-CM | POA: Diagnosis not present

## 2020-03-08 DIAGNOSIS — R609 Edema, unspecified: Secondary | ICD-10-CM | POA: Diagnosis not present

## 2020-03-08 DIAGNOSIS — N1831 Chronic kidney disease, stage 3a: Secondary | ICD-10-CM | POA: Diagnosis not present

## 2020-03-08 DIAGNOSIS — I129 Hypertensive chronic kidney disease with stage 1 through stage 4 chronic kidney disease, or unspecified chronic kidney disease: Secondary | ICD-10-CM | POA: Diagnosis not present

## 2020-03-15 ENCOUNTER — Emergency Department (HOSPITAL_COMMUNITY): Payer: Medicare Other

## 2020-03-15 ENCOUNTER — Encounter (HOSPITAL_COMMUNITY): Payer: Self-pay | Admitting: Emergency Medicine

## 2020-03-15 ENCOUNTER — Emergency Department (HOSPITAL_COMMUNITY)
Admission: EM | Admit: 2020-03-15 | Discharge: 2020-03-15 | Disposition: A | Discharge status: Home / Self Care | Payer: Medicare Other | Attending: Emergency Medicine | Admitting: Emergency Medicine

## 2020-03-15 DIAGNOSIS — K5732 Diverticulitis of large intestine without perforation or abscess without bleeding: Secondary | ICD-10-CM | POA: Insufficient documentation

## 2020-03-15 DIAGNOSIS — Z79899 Other long term (current) drug therapy: Secondary | ICD-10-CM | POA: Diagnosis not present

## 2020-03-15 DIAGNOSIS — Z794 Long term (current) use of insulin: Secondary | ICD-10-CM | POA: Diagnosis not present

## 2020-03-15 DIAGNOSIS — K5900 Constipation, unspecified: Secondary | ICD-10-CM | POA: Diagnosis present

## 2020-03-15 DIAGNOSIS — I129 Hypertensive chronic kidney disease with stage 1 through stage 4 chronic kidney disease, or unspecified chronic kidney disease: Secondary | ICD-10-CM | POA: Diagnosis not present

## 2020-03-15 DIAGNOSIS — Z87891 Personal history of nicotine dependence: Secondary | ICD-10-CM | POA: Diagnosis not present

## 2020-03-15 DIAGNOSIS — E1122 Type 2 diabetes mellitus with diabetic chronic kidney disease: Secondary | ICD-10-CM | POA: Diagnosis not present

## 2020-03-15 DIAGNOSIS — J45909 Unspecified asthma, uncomplicated: Secondary | ICD-10-CM | POA: Insufficient documentation

## 2020-03-15 DIAGNOSIS — Z7982 Long term (current) use of aspirin: Secondary | ICD-10-CM | POA: Diagnosis not present

## 2020-03-15 DIAGNOSIS — N183 Chronic kidney disease, stage 3 unspecified: Secondary | ICD-10-CM | POA: Insufficient documentation

## 2020-03-15 LAB — CBC WITH DIFFERENTIAL/PLATELET
Abs Immature Granulocytes: 0.04 10*3/uL (ref 0.00–0.07)
Basophils Absolute: 0.1 10*3/uL (ref 0.0–0.1)
Basophils Relative: 1 %
Eosinophils Absolute: 0.3 10*3/uL (ref 0.0–0.5)
Eosinophils Relative: 2 %
HCT: 43.5 % (ref 36.0–46.0)
Hemoglobin: 14.8 g/dL (ref 12.0–15.0)
Immature Granulocytes: 0 %
Lymphocytes Relative: 18 %
Lymphs Abs: 2 10*3/uL (ref 0.7–4.0)
MCH: 31.4 pg (ref 26.0–34.0)
MCHC: 34 g/dL (ref 30.0–36.0)
MCV: 92.2 fL (ref 80.0–100.0)
Monocytes Absolute: 0.8 10*3/uL (ref 0.1–1.0)
Monocytes Relative: 7 %
Neutro Abs: 7.8 10*3/uL — ABNORMAL HIGH (ref 1.7–7.7)
Neutrophils Relative %: 72 %
Platelets: 188 10*3/uL (ref 150–400)
RBC: 4.72 MIL/uL (ref 3.87–5.11)
RDW: 13.3 % (ref 11.5–15.5)
WBC: 10.9 10*3/uL — ABNORMAL HIGH (ref 4.0–10.5)
nRBC: 0 % (ref 0.0–0.2)

## 2020-03-15 LAB — URINALYSIS, ROUTINE W REFLEX MICROSCOPIC
Bilirubin Urine: NEGATIVE
Glucose, UA: 150 mg/dL — AB
Hgb urine dipstick: NEGATIVE
Ketones, ur: NEGATIVE mg/dL
Leukocytes,Ua: NEGATIVE
Nitrite: NEGATIVE
Protein, ur: 30 mg/dL — AB
Specific Gravity, Urine: 1.009 (ref 1.005–1.030)
pH: 5 (ref 5.0–8.0)

## 2020-03-15 LAB — COMPREHENSIVE METABOLIC PANEL
ALT: 33 U/L (ref 0–44)
AST: 20 U/L (ref 15–41)
Albumin: 3.7 g/dL (ref 3.5–5.0)
Alkaline Phosphatase: 57 U/L (ref 38–126)
Anion gap: 13 (ref 5–15)
BUN: 22 mg/dL (ref 8–23)
CO2: 24 mmol/L (ref 22–32)
Calcium: 9.8 mg/dL (ref 8.9–10.3)
Chloride: 101 mmol/L (ref 98–111)
Creatinine, Ser: 1.37 mg/dL — ABNORMAL HIGH (ref 0.44–1.00)
GFR, Estimated: 40 mL/min — ABNORMAL LOW (ref >60)
Glucose, Bld: 282 mg/dL — ABNORMAL HIGH (ref 70–99)
Potassium: 4.1 mmol/L (ref 3.5–5.1)
Sodium: 138 mmol/L (ref 135–145)
Total Bilirubin: 0.8 mg/dL (ref 0.3–1.2)
Total Protein: 6.5 g/dL (ref 6.5–8.1)

## 2020-03-15 LAB — LIPASE, BLOOD: Lipase: 22 U/L (ref 11–51)

## 2020-03-15 MED ORDER — CEFDINIR 300 MG PO CAPS: 300.0000 mg | ORAL_CAPSULE | Freq: Two times a day (BID) | ORAL | 0 refills | Status: DC

## 2020-03-15 MED ORDER — METRONIDAZOLE 500 MG PO TABS: 500.0000 mg | ORAL_TABLET | Freq: Three times a day (TID) | ORAL | 0 refills | Status: AC

## 2020-03-15 MED ORDER — METRONIDAZOLE 500 MG PO TABS
500.0000 mg | ORAL_TABLET | Freq: Once | ORAL | Status: AC
  Administered 2020-03-15: 500 mg via ORAL
  Filled 2020-03-15: qty 1

## 2020-03-15 MED ORDER — CEFDINIR 300 MG PO CAPS
300.0000 mg | ORAL_CAPSULE | Freq: Two times a day (BID) | ORAL | Status: DC
  Administered 2020-03-15: 300 mg via ORAL
  Filled 2020-03-15 (×2): qty 1

## 2020-03-15 NOTE — ED Notes (Signed)
Pt transported to CT ?

## 2020-03-15 NOTE — Discharge Instructions (Signed)
Take the antibiotics prescribed, follow-up with your primary care provider for bowel regimen.

## 2020-03-15 NOTE — ED Triage Notes (Signed)
Pt here with c/o constipation ongoing for 3 weeks , pt has tried otc meds and enemas with minimal success

## 2020-03-15 NOTE — ED Notes (Signed)
Pt d/c home per MD order. Discharge summary reviewed with pt, pt verbalizes understanding. No s/s of acute distress noted at discharge. Off unit via WC; Discharged home with visitor.  

## 2020-03-15 NOTE — ED Provider Notes (Signed)
[facility] EMERGENCY DEPARTMENT Provider Note   CSN: [id number] Arrival date & time: 03/15/20  4315     History No chief complaint on file.   Yolanda Saunders is a 75 y.o. female.   Constipation Severity:  Severe Time since last bowel movement:  1 week Timing:  Constant Progression:  Worsening Chronicity:  New Context: not dehydration, not medication and not narcotics   Stool description:  Pellet like Relieved by:  Enemas Worsened by:  Nothing Ineffective treatments:  None tried Associated symptoms: abdominal pain, anorexia and nausea   Associated symptoms: no back pain, no diarrhea, no dysuria, no fever and no vomiting        Past Medical History:  Diagnosis Date  . Anemia 04/17/11   "long, long years ago"  . Angina 04/16/11   "that's what I'm here for"  . Anxiety   . Asthma   . Carpal tunnel syndrome   . Cataracts, bilateral   . CKD (chronic kidney disease), stage III ([place])   . Complication of anesthesia 1987   "affected my eyes; couldn't see anything but blurrs even the next day"  . CVA (cerebral infarction)    After cardiac catheter 02/2000  . Depression   . Edema   . Fibromyalgia   . GERD (gastroesophageal reflux disease)   . Headache(784.0)   . High cholesterol   . History of bronchitis   . Hypertension   . IBS (irritable bowel syndrome)   . Migraines   . Panic attacks 04/17/11   "don't take anything for it"  . Pneumonia 04/17/11   "probably as many as 7 times"  . Renal disorder 04/17/11   "they are working at 60% capacity"  . Shortness of breath on exertion    "cause of my asthma"  . Stroke [facility]) 2002   residual "problem w/using the right word, left 5 lesions on my brain/MRI; long term memory loss"  . Type II diabetes mellitus [facility])     Patient Active Problem List   Diagnosis Date Noted  . Plantar fasciitis 06/03/2017  . Syncope 10/15/2016  . CKD (chronic kidney disease), stage III ([place]) 10/15/2016  . Type 2 diabetes  mellitus with stage 3 chronic kidney disease, with long-term current use of insulin ([place]) 08/07/2015  . Bilateral leg weakness 06/06/2014  . Abnormality of gait 06/06/2014  . Lumbosacral stenosis 06/06/2014  . Chest pain 04/16/2011  . Hyperlipidemia 09/13/2008  . Class 1 obesity 09/13/2008  . Essential hypertension 09/13/2008  . CVA 09/13/2008  . ASTHMA 09/13/2008  . IRRITABLE BOWEL SYNDROME 09/13/2008  . GALLSTONES 09/13/2008  . UTI 09/13/2008  . ARTHRITIS 09/13/2008  . FIBROMYALGIA 09/13/2008  . HEADACHE, CHRONIC 09/13/2008    Past Surgical History:  Procedure Laterality Date  . CARDIAC CATHETERIZATION  2002  . CARPAL TUNNEL RELEASE  2003-2010   "twice on left; once on right"  . CHOLECYSTECTOMY  1987  . DEBRIDEMENT TENNIS ELBOW  2010  . VAGINAL HYSTERECTOMY  1977     OB History   No obstetric history on file.     Family History  Problem Relation Age of Onset  . Coronary artery disease Father   . Diabetes Mellitus I Father   . CVA Mother   . Stroke Mother   . Diabetes Mellitus I Mother   . Diabetes Mellitus I Sister   . Diabetes Mellitus I Brother   . Diabetes Mellitus I Maternal Grandmother   . Diabetes Mellitus I Maternal Grandfather   .  Diabetes Mellitus I Paternal Grandmother   . Diabetes Mellitus I Paternal Grandfather   . Diabetes Mellitus I Brother   . Aortic aneurysm Son     Social History   Tobacco Use  . Smoking status: Former Smoker    Packs/day: 0.75    Years: 6.00    Pack years: 4.50    Types: Cigarettes    Quit date: 01/21/1980    Years since quitting: 40.1  . Smokeless tobacco: Never Used  Substance Use Topics  . Alcohol use: No    Alcohol/week: 0.0 standard drinks  . Drug use: No    Home Medications Prior to Admission medications   Medication Sig Start Date End Date Taking? Authorizing Provider  cefdinir (OMNICEF) 300 MG capsule Take 1 capsule (300 mg total) by mouth 2 (two) times daily. 03/15/20  Yes Breck Coons, MD  metroNIDAZOLE  (FLAGYL) 500 MG tablet Take 1 tablet (500 mg total) by mouth 3 (three) times daily for 7 days. 03/15/20 03/22/20 Yes Breck Coons, MD  albuterol (PROVENTIL HFA;VENTOLIN HFA) 108 (90 BASE) MCG/ACT inhaler Inhale 2 puffs into the lungs every 6 (six) hours as needed. For shortness of breath    [provider]  aspirin EC 81 MG tablet Take 81 mg by mouth daily.    [provider]  atorvastatin (LIPITOR) 40 MG tablet  01/17/16   [provider]  Blood Glucose Monitoring Suppl (ONETOUCH VERIO) w/Device KIT 1 each by Does not apply route daily. 11/14/15   Philemon Kingdom, MD  furosemide (LASIX) 20 MG tablet Take 20 mg by mouth 2 (two) times daily.     [provider]  insulin glargine (LANTUS) 100 UNIT/ML injection INJECT 40 UNITS SUBCUTANEOUSLY 2x a day 02/26/17   Philemon Kingdom, MD  Insulin Syringe-Needle U-100 (RELION INSULIN SYRINGE) 31G X 15/64" 1 ML MISC Use 3 times daily.. 11/08/15   Philemon Kingdom, MD  ONETOUCH VERIO test strip USE 1 STRIP TO CHECK GLUCOSE THREE TIMES DAILY 12/15/18   Philemon Kingdom, MD  ranitidine (ZANTAC) 300 MG tablet Take 300 mg by mouth 2 (two) times daily.    [provider]  RELION INSULIN SYR 0.5ML/31G 31G X 5/16" 0.5 ML MISC USE THREE TIMES A DAY 02/17/19   Philemon Kingdom, MD  Syringe/Needle, Disp, (SYRINGE LUER LOCK) 23G X 1" 3 ML MISC 1 Package by Does not apply route as needed. 07/21/14   Narda Amber K, DO  Vitamin D, Cholecalciferol, 1000 UNITS TABS Take 2,000 Units by mouth daily.     [provider]    Allergies    Codeine, Erythromycin, Lisinopril, Penicillins, Sulfonamide derivatives, Metoprolol, Actos [pioglitazone], Amlodipine, Benicar [olmesartan], Byetta 10 mcg pen [exenatide], Glimepiride, Glipizide, Glyburide-metformin, Humalog [insulin lispro], Invokana [canagliflozin], Januvia [sitagliptin], Reglan [metoclopramide], Septra [sulfamethoxazole-trimethoprim], Spironolactone, Victoza [liraglutide],  Zocor [simvastatin], Losartan potassium, and Novolin r [insulin]  Review of Systems   Review of Systems  Constitutional: Negative for chills and fever.  HENT: Negative for congestion and rhinorrhea.   Respiratory: Negative for cough and shortness of breath.   Cardiovascular: Negative for chest pain and palpitations.  Gastrointestinal: Positive for abdominal pain, anorexia, constipation and nausea. Negative for diarrhea and vomiting.  Genitourinary: Negative for difficulty urinating and dysuria.  Musculoskeletal: Negative for arthralgias and back pain.  Skin: Negative for rash and wound.  Neurological: Negative for light-headedness and headaches.    Physical Exam Updated Vital Signs BP (!) 128/55   Pulse 67   Temp 98.2 F (36.8 C) (Oral)  Resp 16   SpO2 99%   Physical Exam Vitals and nursing note reviewed. Exam conducted with a chaperone present.  Constitutional:      General: She is not in acute distress.    Appearance: Normal appearance.  HENT:     Head: Normocephalic and atraumatic.     Nose: No rhinorrhea.  Eyes:     General:        Right eye: No discharge.        Left eye: No discharge.     Conjunctiva/sclera: Conjunctivae normal.  Cardiovascular:     Rate and Rhythm: Normal rate and regular rhythm.  Pulmonary:     Effort: Pulmonary effort is normal. No respiratory distress.     Breath sounds: No stridor.  Abdominal:     General: Abdomen is flat. There is no distension.     Palpations: Abdomen is soft.     Tenderness: There is no abdominal tenderness. There is no guarding or rebound.     Hernia: No hernia is present.  Genitourinary:    Comments: Rectal exam without signs of hemorrhoid melena or gross blood or fissure.  Hard stool palpated in the rectum. Musculoskeletal:        General: No tenderness or signs of injury.  Skin:    General: Skin is warm and dry.  Neurological:     General: No focal deficit present.     Mental Status: She is alert. Mental  status is at baseline.     Motor: No weakness.  Psychiatric:        Mood and Affect: Mood normal.        Behavior: Behavior normal.     ED Results / Procedures / Treatments   Labs (all labs ordered are listed, but only abnormal results are displayed) Labs Reviewed  CBC WITH DIFFERENTIAL/PLATELET - Abnormal; Notable for the following components:      Result Value   WBC 10.9 (*)    Neutro Abs 7.8 (*)    All other components within normal limits  COMPREHENSIVE METABOLIC PANEL - Abnormal; Notable for the following components:   Glucose, Bld 282 (*)    Creatinine, Ser 1.37 (*)    GFR, Estimated 40 (*)    All other components within normal limits  URINALYSIS, ROUTINE W REFLEX MICROSCOPIC - Abnormal; Notable for the following components:   Color, Urine STRAW (*)    Glucose, UA 150 (*)    Protein, ur 30 (*)    Bacteria, UA RARE (*)    All other components within normal limits  LIPASE, BLOOD    EKG None  Radiology CT ABDOMEN PELVIS WO CONTRAST  Result Date: 03/15/2020 CLINICAL DATA:  Lower abdominal pain and constipation EXAM: CT ABDOMEN AND PELVIS WITHOUT CONTRAST TECHNIQUE: Multidetector CT imaging of the abdomen and pelvis was performed following the standard protocol without IV contrast. COMPARISON:  2013 FINDINGS: Lower chest: No acute abnormality. Hepatobiliary: No focal liver abnormality is seen. Status post cholecystectomy. No biliary dilatation. Pancreas: Unremarkable. Spleen: Unremarkable. Adrenals/Urinary Tract: Adrenals are unremarkable. Small cyst of the upper pole of the right kidney. Bladder is poorly distended but unremarkable. Stomach/Bowel: Stomach is within normal limits. Bowel is normal in caliber. Sigmoid diverticulosis with mild adjacent fat infiltration. Mild to moderate stool burden. Normal appendix. Vascular/Lymphatic: Aortic atherosclerosis. No enlarged lymph nodes. Reproductive: Status post hysterectomy. No adnexal masses. Other: No ascites.  No acute  abnormality of the abdominal wall. Musculoskeletal: No acute osseous abnormality. IMPRESSION: Acute sigmoid diverticulitis without complication.  Electronically Signed   By: Macy Mis M.D.   On: 03/15/2020 12:31    Procedures Procedures   Medications Ordered in ED Medications  cefdinir (OMNICEF) capsule 300 mg (has no administration in time range)  metroNIDAZOLE (FLAGYL) tablet 500 mg (has no administration in time range)    ED Course  I have reviewed the triage vital signs and the nursing notes.  Pertinent labs & imaging results that were available during my care of the patient were reviewed by me and considered in my medical decision making (see chart for details).    MDM Rules/Calculators/A&P                          History of IBS-C.  Has been trying medications at home that are over-the-counter for constipation, minimal relief still feels that she is significantly constipated.  Tolerating p.o. no nausea vomiting currently.  Rectal exam with hard impacted stool, I disimpacted what I could but could not reach far enough to disimpact what I assume is a much larger stool burden. Will get CT ab/pel without contrast.   Laboratory studies show some mild leukocytosis.  Otherwise fairly unremarkable with a slightly elevated creatinine.  Patient is able to tolerate p.o. hydration as we recommend p.o. hydration.  History of constipation plus CT findings consistent with uncomplicated sigmoid diverticulitis.  Recommend antibiotics.  Due to the patient's antibiotic allergy list consulted pharmacy, we have, with a regimen of cefdinir and Flagyl.  First dose here prescribed antibiotics for discharge home.  And follow-up with outpatient providers for bowel regimen and management.  Return precautions discussed.\    Final Clinical Impression(s) / ED Diagnoses Final diagnoses:  Sigmoid diverticulitis    Rx / DC Orders ED Discharge Orders         Ordered    cefdinir (OMNICEF) 300 MG capsule  2  times daily        03/15/20 1344    metroNIDAZOLE (FLAGYL) 500 MG tablet  3 times daily        03/15/20 1344           Breck Coons, MD 03/15/20 1346

## 2020-03-20 DIAGNOSIS — E1121 Type 2 diabetes mellitus with diabetic nephropathy: Secondary | ICD-10-CM | POA: Diagnosis not present

## 2020-03-20 DIAGNOSIS — M797 Fibromyalgia: Secondary | ICD-10-CM | POA: Diagnosis not present

## 2020-03-20 DIAGNOSIS — I10 Essential (primary) hypertension: Secondary | ICD-10-CM | POA: Diagnosis not present

## 2020-03-20 DIAGNOSIS — E7800 Pure hypercholesterolemia, unspecified: Secondary | ICD-10-CM | POA: Diagnosis not present

## 2020-03-20 DIAGNOSIS — K5792 Diverticulitis of intestine, part unspecified, without perforation or abscess without bleeding: Secondary | ICD-10-CM | POA: Diagnosis not present

## 2020-03-20 DIAGNOSIS — K5904 Chronic idiopathic constipation: Secondary | ICD-10-CM | POA: Diagnosis not present

## 2020-03-20 DIAGNOSIS — N183 Chronic kidney disease, stage 3 unspecified: Secondary | ICD-10-CM | POA: Diagnosis not present

## 2020-03-24 ENCOUNTER — Other Ambulatory Visit: Payer: Self-pay

## 2020-03-24 ENCOUNTER — Ambulatory Visit
Admission: RE | Admit: 2020-03-24 | Discharge: 2020-03-24 | Disposition: A | Discharge status: Home / Self Care | Payer: Medicare Other | Source: Ambulatory Visit | Attending: Orthopedic Surgery | Admitting: Orthopedic Surgery

## 2020-03-24 DIAGNOSIS — M48061 Spinal stenosis, lumbar region without neurogenic claudication: Secondary | ICD-10-CM | POA: Diagnosis not present

## 2020-03-24 DIAGNOSIS — M25551 Pain in right hip: Secondary | ICD-10-CM

## 2020-03-24 DIAGNOSIS — M545 Low back pain, unspecified: Secondary | ICD-10-CM

## 2020-03-27 DIAGNOSIS — M25551 Pain in right hip: Secondary | ICD-10-CM | POA: Diagnosis not present

## 2020-04-05 DIAGNOSIS — M5416 Radiculopathy, lumbar region: Secondary | ICD-10-CM | POA: Diagnosis not present

## 2020-04-13 ENCOUNTER — Other Ambulatory Visit: Payer: Self-pay | Admitting: Internal Medicine

## 2020-04-16 DIAGNOSIS — M5416 Radiculopathy, lumbar region: Secondary | ICD-10-CM | POA: Diagnosis not present

## 2020-04-17 ENCOUNTER — Other Ambulatory Visit: Payer: Self-pay

## 2020-04-17 ENCOUNTER — Encounter: Payer: Self-pay | Admitting: Internal Medicine

## 2020-04-17 ENCOUNTER — Ambulatory Visit (INDEPENDENT_AMBULATORY_CARE_PROVIDER_SITE_OTHER): Payer: Medicare Other | Admitting: Internal Medicine

## 2020-04-17 VITALS — BP 138/80 | HR 68 | Ht 63.5 in | Wt 195.0 lb

## 2020-04-17 DIAGNOSIS — N183 Chronic kidney disease, stage 3 unspecified: Secondary | ICD-10-CM | POA: Diagnosis not present

## 2020-04-17 DIAGNOSIS — E785 Hyperlipidemia, unspecified: Secondary | ICD-10-CM | POA: Diagnosis not present

## 2020-04-17 DIAGNOSIS — Z794 Long term (current) use of insulin: Secondary | ICD-10-CM

## 2020-04-17 DIAGNOSIS — E1122 Type 2 diabetes mellitus with diabetic chronic kidney disease: Secondary | ICD-10-CM

## 2020-04-17 LAB — POCT GLYCOSYLATED HEMOGLOBIN (HGB A1C): Hemoglobin A1C: 9.1 % — AB (ref 4.0–5.6)

## 2020-04-17 NOTE — Patient Instructions (Addendum)
Please restart: - Lantus 30 units in am >> increase to 40 units if sugars are still high after 2 days. Continue to increase by 4 units every 2 days until sugars in am are <140.  Please increase: - R insulin: 30-40 units 30 min before meals.  Please return in 1 month with your sugar log.

## 2020-04-17 NOTE — Progress Notes (Signed)
Patient ID: Yolanda Saunders, female   DOB: 07/07/1945, 75 y.o.   MRN: [id number]  This visit occurred during the SARS-CoV-2 public health emergency.  Safety protocols were in place, including screening questions prior to the visit, additional usage of staff PPE, and extensive cleaning of exam room while observing appropriate contact time as indicated for disinfecting solutions.   HPI: Yolanda Saunders is a 75 y.o.-year-old female, returning for follow-up for DM2, dx 1999, insulin-dependent since ~2012, uncontrolled, with complications (CKD stage 3, h/o stroke post cardiac cath in 2002, macroalbuminuria). She saw Dr Buddy Duty in the past. Last visit with me 1.5 years ago.  Previous visit was 1 year prior. This appointment was scheduled emergently after sugars increased close to 500s after a steroid injection.  Interim history: She had Covid19 in 12/2019 >> developed significant back pain. Had a steroid inj in back yesterday.  Sugars increased significantly.  She increased her regular insulin doses and ended up taking more than 280 units of insulin yesterday!  Upon questioning, she is off Lantus since our last visit as she misunderstood instructions!! During investigation of her back pain, she was found to have diverticulitis.  This was treated. At last visit she continues to describe significant stress taking care of her husband who had a history of seizures and an MI.  Last hemoglobin A1c was: 09/21/2019: HbA1c 11.5% Lab Results  Component Value Date   HGBA1C 9.5 (A) 09/24/2018   HGBA1C 9.1 (A) 09/25/2017   HGBA1C 8.4 05/28/2017  07/31/2014: 13.4%   In the past, she had a period in which she could not afford analog insulin products  >> we changed to:  Insulin Before breakfast Before lunch Before dinner  Regular 25 - smaller meal 30 - larger meal 25 - smaller meal 30 - larger meal 25 - smaller meal 30 - larger meal  NPH 40  30   At last visit: Lantus and Humalog: - Lantus 40 units in am and  50 units at bedtime. - Humalog: >> HAs >> stopped  b'fast 38 units  lunch 38 units  dinner 40 to 42 units Try to take 5-10 units if you have a snack after dinner.  I recommended the following regimen at last visit: - Lantus 40 units twice a day >> (vials)  off since last OV (!!!!) - Regular (ReliOn):  b'fast 15-20 units >> 30 units  lunch 15-20 units >> 32 units  dinner 15-25 units >> 34 units Try to take 5-10 units if you have a snack after dinner.  We stopped Invokana 100 mg before b'fast >> lethargy, fatigue -  in 11/2015 I suggested Bydureon 2 mg weekly - in donut hole >> could not start Tried Victoza >> helped, but AP She stopped Metformin ER 500 mg 2x a day because of AP, but does have IBS. She feels better after she stopped Tried Byetta >> N/V Tried Metformin >> AP, diarrhea, gas Tried SU >> upset stomach, nausea Has been on Invokana before >> yeast inf She had elevated lipase (72) in 09/2011 while on Victoza.  She also reportedly had pancreatitis at the beginning of 2019.  Patient checks blood sugars sugars 2-3x a day - yesterday/last 1 mo: - am: 126-230 >> 121-281 >> 116-321 >> 53, 83-280 >> 156/42-180 - 2h after b'fast:  258 >> n/c >> 248 >> 58, 214-265 >> 218 >> n/c - before lunch:n/c >> 70-242 >> 58, 95-228, 246 >> 62-144 >> 353/130s - 2h after lunch:  132-207 >> 66,  174-301 >> 160 >> 285 >> 355/n/c - before dinner:n/c >>66-228, 284 >> 68, 273 >> 257, 285 >> 395/180s - 2h after dinner:200s >> 162-279 >> n/c >> 175-307 >> 223 >> 425/180-185 - bedtime: 96-319, 389 >> see below >> 106-307 >> 196, 311 >> 409 - nighttime:  394 >> n/c >> 77-277 >> 321, 332 >> 252-349 >> 402 Lowest sugar was: 44 x1 (took too much insulin), 58 >> 116 >> 53 >> 42.  she has hypoglycemia awareness in the 60s Highest sugar was 311 after stopping Humalog, 600s on Humalog >> 400-500.   Glucometer: Free Style  Pt's meals are: - Breakfast: oatmeal - Lunch: sandwich; chicken nuggets and  fries - Dinner: heaviest: meat + 2 veggies + dessert later - Snacks: "too many" - cookies, sugary snacks  -+ CKD, last BUN/creatinine: Lab Results  Component Value Date   BUN 22 03/15/2020   CREATININE 1.37 (H) 03/15/2020  Received labs from PCP 09/21/2019: HbA1c 11.5%, glucose 373, BUN/creatinine 21/1.41, GFR 36  + MAU: Lab Results  Component Value Date   MICRALBCREAT 32.0 (H) 02/11/2016  04/2014: ACR: >300 She sees nephrology. Intolerant to ACEI/ARBs.  -+ HL; last set of lipids: 09/21/2019: 236/255/37/151 Lab Results  Component Value Date   CHOL 193 09/24/2018   HDL 36.30 (L) 09/24/2018   LDLCALC 108 (H) 05/08/2015   LDLDIRECT 144.0 09/24/2018   TRIG 221.0 (H) 09/24/2018   CHOLHDL 5 09/24/2018  07/09/2016: 175/122/36/150 On Lipitor 40. - last eye exam was in 2022: + DR. She had cataract surgery since last visit. -She denies numbness and tingling in her feet.  07/09/2016: TSH normal, at 3.92. Lab Results  Component Value Date   TSH 2.337 06/06/2014   She was seeing Dr. Narda Amber >> investigation for disequllibrium and word difficulty. ? MS. She also has HTN,  anemia, GERD.  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  ROS: Constitutional: no weight gain/no weight loss, no fatigue, no subjective hyperthermia, no subjective hypothermia Eyes: no blurry vision, no xerophthalmia ENT: no sore throat, no nodules palpated in neck, no dysphagia, no odynophagia, no hoarseness Cardiovascular: no CP/no SOB/no palpitations/no leg swelling Respiratory: no cough/no SOB/no wheezing Gastrointestinal: no N/no V/no D/no C/no acid reflux Musculoskeletal: + muscle aches/+ joint aches (back) Skin: no rashes, no hair loss Neurological: no tremors/no numbness/no tingling/no dizziness  I reviewed pt's medications, allergies, PMH, social hx, family hx, and changes were documented in the history of present illness. Otherwise, unchanged from my initial visit note.  Past Medical History:   Diagnosis Date  . Anemia 04/17/11   "long, long years ago"  . Angina 04/16/11   "that's what I'm here for"  . Anxiety   . Asthma   . Carpal tunnel syndrome   . Cataracts, bilateral   . CKD (chronic kidney disease), stage III ([place])   . Complication of anesthesia 1987   "affected my eyes; couldn't see anything but blurrs even the next day"  . CVA (cerebral infarction)    After cardiac catheter 02/2000  . Depression   . Edema   . Fibromyalgia   . GERD (gastroesophageal reflux disease)   . Headache(784.0)   . High cholesterol   . History of bronchitis   . Hypertension   . IBS (irritable bowel syndrome)   . Migraines   . Panic attacks 04/17/11   "don't take anything for it"  . Pneumonia 04/17/11   "probably as many as 7 times"  . Renal disorder 04/17/11   "  they are working at [employer] capacity"  . Shortness of breath on exertion    "cause of my asthma"  . Stroke [facility]) 2002   residual "problem w/using the right word, left 5 lesions on my brain/MRI; long term memory loss"  . Type II diabetes mellitus ([place])    Past Surgical History:  Procedure Laterality Date  . CARDIAC CATHETERIZATION  2002  . CARPAL TUNNEL RELEASE  2003-2010   "twice on left; once on right"  . CHOLECYSTECTOMY  1987  . DEBRIDEMENT TENNIS ELBOW  2010  . VAGINAL HYSTERECTOMY  1977   Social History   Social History  . Marital Status: Married   Social History Main Topics  . Smoking status: Former Smoker -- 0.75 packs/day for 6 years    Types: Cigarettes    Quit date: 01/21/1980  . Smokeless tobacco: Never Used  . Alcohol Use: No  . Drug Use: No   Social History Narrative   Lives with husband in a one story home.  Has 2 children.  Retired from [employer].  Education: 12th grade.  Trade schools.    Current Outpatient Medications on File Prior to Visit  Medication Sig Dispense Refill  . albuterol (PROVENTIL HFA;VENTOLIN HFA) 108 (90 BASE) MCG/ACT inhaler Inhale 2 puffs into the lungs every 6 (six) hours as  needed. For shortness of breath    . aspirin EC 81 MG tablet Take 81 mg by mouth daily.    Marland Kitchen atorvastatin (LIPITOR) 40 MG tablet     . Blood Glucose Monitoring Suppl (ONETOUCH VERIO) w/Device KIT 1 each by Does not apply route daily. 1 kit 0  . cefdinir (OMNICEF) 300 MG capsule Take 1 capsule (300 mg total) by mouth 2 (two) times daily. 14 capsule 0  . furosemide (LASIX) 20 MG tablet Take 20 mg by mouth 2 (two) times daily.     . insulin glargine (LANTUS) 100 UNIT/ML injection INJECT 40 UNITS SUBCUTANEOUSLY 2x a day 20 mL 5  . Insulin Syringe-Needle U-100 (RELION INSULIN SYRINGE) 31G X 15/64" 1 ML MISC Use 3 times daily.. 300 each 2  . ONETOUCH VERIO test strip USE 1 STRIP TO CHECK GLUCOSE THREE TIMES DAILY 200 each 11  . ranitidine (ZANTAC) 300 MG tablet Take 300 mg by mouth 2 (two) times daily.    Marland Kitchen RELION INSULIN SYR 0.5ML/31G 31G X 5/16" 0.5 ML MISC USE  THREE TIMES DAILY 300 each 0  . Syringe/Needle, Disp, (SYRINGE LUER LOCK) 23G X 1" 3 ML MISC 1 Package by Does not apply route as needed. 50 each 0  . Vitamin D, Cholecalciferol, 1000 UNITS TABS Take 2,000 Units by mouth daily.      No current facility-administered medications on file prior to visit.   Allergies  Allergen Reactions  . Codeine Other (See Comments)    "makes me crazy; see things; delusional"  . Erythromycin Other (See Comments)    "peeled skin; like a sunburn & I turn the color of the pill; a kind of purple-look"  . Lisinopril Cough    "cause I have asthma"  . Penicillins Rash and Other (See Comments)    "puffy blisters  . Sulfonamide Derivatives Hives    "watery hives"  . Metoprolol Diarrhea and Nausea And Vomiting    Rapid weight gain.  . Actos [Pioglitazone]     Upset GI  . Amlodipine     Syncope  . Benicar [Olmesartan] Other (See Comments)    Dizziness and HA  . Byetta 10 Mcg  Pen [Exenatide] Nausea And Vomiting    5 MCG pen  . Glimepiride     Upset GI  . Glipizide     Upset GI  . Glyburide-Metformin      Myalgias  . Humalog [Insulin Lispro]     Headache, SEVERE HYPERGLYCEMIA  . Invokana [Canagliflozin]     Yeast infections  . Januvia [Sitagliptin]     uti  . Reglan [Metoclopramide]     unknown  . Septra [Sulfamethoxazole-Trimethoprim] Hives  . Spironolactone     unk  . Victoza [Liraglutide] Diarrhea    Abdominal pain   . Zocor [Simvastatin]     unknown  . Losartan Potassium Rash    Upset GI  . Novolin R [Insulin] Swelling and Rash    70/30   Family History  Problem Relation Age of Onset  . Coronary artery disease Father   . Diabetes Mellitus I Father   . CVA Mother   . Stroke Mother   . Diabetes Mellitus I Mother   . Diabetes Mellitus I Sister   . Diabetes Mellitus I Brother   . Diabetes Mellitus I Maternal Grandmother   . Diabetes Mellitus I Maternal Grandfather   . Diabetes Mellitus I Paternal Grandmother   . Diabetes Mellitus I Paternal Grandfather   . Diabetes Mellitus I Brother   . Aortic aneurysm Son    PE: BP 138/80 (BP Location: Left Arm, Patient Position: Sitting, Cuff Size: Normal)   Pulse 68   Ht 5' 3.5" (1.613 m)   Wt 195 lb (88.5 kg)   SpO2 98%   BMI 34.00 kg/m  Body mass index is 34 kg/m. Wt Readings from Last 3 Encounters:  04/17/20 195 lb (88.5 kg)  09/24/18 199 lb (90.3 kg)  09/25/17 203 lb (92.1 kg)   Constitutional: overweight, in NAD Eyes: PERRLA, EOMI, no exophthalmos ENT: moist mucous membranes, no thyromegaly, no cervical lymphadenopathy Cardiovascular: RRR, No MRG Respiratory: CTA B Gastrointestinal: abdomen soft, NT, ND, BS+ Musculoskeletal: no deformities, strength intact in all 4 Skin: moist, warm, no rashes Neurological: no tremor with outstretched hands, DTR normal in all 4  ASSESSMENT: 1. DM2, insulin-dependent, uncontrolled, with complications - CKD stage 3 - h/o stroke post cardiac cath in 2002 - macroalbuminuria >> improved - Lipoatrophy at the site of insulin injections  2. Obesity class 2 BMI  Classification:  < 18.5 underweight   18.5-24.9 normal weight   25.0-29.9 overweight   30.0-34.9 class I obesity   35.0-39.9 class II obesity   ? 40.0 class III obesity   3. HL  PLAN:  1. Patient with longstanding, uncontrolled, type 2 diabetes, insulin-dependent, on basal-bolus insulin regimen, returning after a long absence of 1.5 years.  Previous visit was a year prior.  She just called [place] requesting an urgent appointment for blood sugars in the 500s while on steroids, despite increasing her insulin doses.  She was added on to the schedule on an emergent basis. -At this visit, we discussed that if she does not follow recommendations regarding follow-up, I would not be able to help her.  In the case of her very uncontrolled diabetes, appointments every 1 to 1.5 years are not sufficient and not recommended. -I reviewed her previous diabetic regimen.  We cannot use a GLP-1 receptor agonist due to history of pancreatitis. Invokana caused dizziness, fatigue, yeast infections.   -We discussed in the past about improving diet and switching to a more plant-based diet or weight watchers, if preferred.  We also discussed about  gastric bypass surgery.  She declined all of these. -At last visit, sugars are very high, mostly in the 200s to 300s and spiking into 400s to 600s.  She had intermittent compliance with insulin in the past, especially with Humalog, which caused headaches.  At last visit she was off Humalog for a week and sugars are actually better, but still significantly above target.  I suggested to switch to regular insulin (ReliOn).  However, afterwards, she was lost for follow-up.  -At this visit, she tells me that she misunderstood instructions at last visit and actually stopped Lantus completely!  I advised her to always read the written instructions given to her just in case she may forget some of the instructions given at the time of the visit.  Also, she needs to return for more  frequent visits. -She describes that she just got an injection of steroids yesterday morning and sugars increased from the 150s in the morning to 400s.  She took repeated boluses of regular insulin of 30 to 40 units without being able to control the blood sugars.  She did not have hypoglycemia overnight.  -At today's visit, we discussed about adding back Lantus in the morning and titrating the dose up as needed.  We will also increase her regular insulin dose for now.  I encouraged her to stay in touch with me to titrate her regimen further. - I advised her to:  Patient Instructions  Please restart: - Lantus 30 units in am >> increase to 40 units if sugars are still high after 2 days. Continue to increase by 4 units every 2 days until sugars in am are <140.  Please increase: - R insulin: 30-40 units 30 min before meals.  Please return in 1 month with your sugar log.   - we checked her HbA1c: 9.1% (lower) - advised to check sugars at different times of the day - 3x a day, rotating check times - advised for yearly eye exams >> she is UTD - return to clinic in 1 months  2. . Obesity class 2 -She is on insulin, which can be weight inducing. -She lost 4 pounds since our last in person visit. Some of this could be due to glucotoxicity.  3. HL - Reviewed latest lipid panel from 09/2019: LDL high, triglycerides also elevated  -Continues Lipitor 40 mg daily without side effects  Philemon Kingdom, MD PhD [facility]

## 2020-05-03 DIAGNOSIS — M5416 Radiculopathy, lumbar region: Secondary | ICD-10-CM | POA: Diagnosis not present

## 2020-05-14 DIAGNOSIS — M25551 Pain in right hip: Secondary | ICD-10-CM | POA: Diagnosis not present

## 2020-05-14 DIAGNOSIS — M25552 Pain in left hip: Secondary | ICD-10-CM | POA: Diagnosis not present

## 2020-05-17 DIAGNOSIS — R030 Elevated blood-pressure reading, without diagnosis of hypertension: Secondary | ICD-10-CM | POA: Diagnosis not present

## 2020-05-17 DIAGNOSIS — M5416 Radiculopathy, lumbar region: Secondary | ICD-10-CM | POA: Diagnosis not present

## 2020-05-22 ENCOUNTER — Ambulatory Visit: Payer: Medicare Other | Admitting: Internal Medicine

## 2020-05-22 ENCOUNTER — Encounter: Payer: Self-pay | Admitting: Internal Medicine

## 2020-05-22 ENCOUNTER — Other Ambulatory Visit: Payer: Self-pay

## 2020-05-22 VITALS — BP 128/82 | HR 85 | Ht 63.5 in | Wt 193.0 lb

## 2020-05-22 DIAGNOSIS — Z794 Long term (current) use of insulin: Secondary | ICD-10-CM

## 2020-05-22 DIAGNOSIS — N183 Chronic kidney disease, stage 3 unspecified: Secondary | ICD-10-CM | POA: Diagnosis not present

## 2020-05-22 DIAGNOSIS — E1122 Type 2 diabetes mellitus with diabetic chronic kidney disease: Secondary | ICD-10-CM | POA: Diagnosis not present

## 2020-05-22 DIAGNOSIS — E785 Hyperlipidemia, unspecified: Secondary | ICD-10-CM

## 2020-05-22 MED ORDER — "INSULIN SYRINGE-NEEDLE U-100 31G X 5/16"" 0.5 ML MISC": 5 refills | Status: DC

## 2020-05-22 MED ORDER — ONETOUCH VERIO VI STRP: ORAL_STRIP | 11 refills | Status: DC

## 2020-05-22 MED ORDER — INSULIN GLARGINE 100 UNIT/ML ~~LOC~~ SOLN: 30.0000 [IU] | Freq: Every day | SUBCUTANEOUS | 3 refills | Status: DC

## 2020-05-22 NOTE — Progress Notes (Signed)
Patient ID: Yolanda Saunders, female   DOB: Feb 05, 1945, 75 y.o.   MRN: [id number]  This visit occurred during the SARS-CoV-2 public health emergency.  Safety protocols were in place, including screening questions prior to the visit, additional usage of staff PPE, and extensive cleaning of exam room while observing appropriate contact time as indicated for disinfecting solutions.   HPI: Yolanda Saunders is a 75 y.o.-year-old female, returning for follow-up for DM2, dx 1999, insulin-dependent since ~2012, uncontrolled, with complications (CKD stage 3, h/o stroke post cardiac cath in 2002, macroalbuminuria). She saw Dr Buddy Duty in the past.  Last visit 1.5 months ago.    Interim history: At last visit he presented with very high blood sugars after a steroid injection in her back.  She was off Lantus as she misunderstood instructions at the previous visit.  At that time, she returned after 1.5 years from the previous visit. She continues to have significant stress taking care of her husband who has a history of seizures and an MRI stress taking care of her husband who had a history of seizures and had an MRI. Still having back pain. On Tramadol. Waiting for Sx.  Last hemoglobin A1c was: Lab Results  Component Value Date   HGBA1C 9.1 (A) 04/17/2020   HGBA1C 9.5 (A) 09/24/2018   HGBA1C 9.1 (A) 09/25/2017  09/21/2019: HbA1c 11.5% 07/31/2014: 13.4%   In the past, she had a period in which she could not afford analog insulin products  >> we changed to:  Insulin Before breakfast Before lunch Before dinner  Regular 25 - smaller meal 30 - larger meal 25 - smaller meal 30 - larger meal 25 - smaller meal 30 - larger meal  NPH 40  30   Prev. On: - Lantus 40 units in am and 50 units at bedtime. - Humalog: >> HAs >> stopped  b'fast 38 units  lunch 38 units  dinner 40 to 42 units Try to take 5-10 units if you have a snack after dinner.  Currently on: - Lantus 40 units twice a day >> (vials)  off >>  restarted 03/2020: 30 >> 20 units in am - Regular (ReliOn):  b'fast 15-20 units >> 30 >> 34 units  lunch 15-20 units >> 32 >> 34 units  dinner 15-25 units >> 34 units Try to take 5-10 units if you have a snack after dinner.  We stopped Invokana 100 mg before b'fast >> lethargy, fatigue -  in 11/2015 I suggested Bydureon 2 mg weekly - in donut hole >> could not start Tried Victoza >> helped, but AP She stopped Metformin ER 500 mg 2x a day because of AP, but does have IBS. She feels better after she stopped Tried Byetta >> N/V Tried Metformin >> AP, diarrhea, gas Tried SU >> upset stomach, nausea Has been on Invokana before >> yeast inf She had elevated lipase (72) in 09/2011 while on Victoza.  She also reportedly had pancreatitis at the beginning of 2019.  Patient checks blood sugars sugars 2-3x a day: - am:  116-321 >> 53, 83-280 >> 156/42-180 >> 47, 52-119, 169, 173, 290 (ate at night b/c lows) - 2h after b'fast:  258 >> n/c >> 248 >> 58, 214-265 >> 218 >> n/c >> 62, 64 - before lunch: 58, 95-228, 246 >> 62-144 >> 353/130s >> 63, 68-132, 162 - 2h after lunch:   66, 174-301 >> 160 >> 285 >> 355/n/c >> 73-199, 225, 266 - before dinner: 66-228, 284 >> 68, 273 >>  257, 285 >> 395/180s >> 86-199 - 2h after dinner: 162-279 >> n/c >> 175-307 >> 223 >> 425/180-185 >> 73-160 - bedtime:  see below >> 106-307 >> 196, 311 >> 409 >> 56, 102-153, 202, 205 - nighttime:  394 >> n/c >> 77-277 >> 321, 332 >> 252-349 >> 402 >> 46, 178 Lowest sugar was: 42 >> 46.  she has hypoglycemia awareness in the 60s Highest sugar was 600s on Humalog >> 400-500 >> 300s.   Glucometer: Free Style  Pt's meals are: - Breakfast: oatmeal - Lunch: sandwich; chicken nuggets and fries - Dinner: heaviest: meat + 2 veggies + dessert later - Snacks: "too many" - cookies, sugary snacks  -+ CKD, last BUN/creatinine: Lab Results  Component Value Date   BUN 22 03/15/2020   CREATININE 1.37 (H) 03/15/2020  Received labs  from PCP 09/21/2019: HbA1c 11.5%, glucose 373, BUN/creatinine 21/1.41, GFR 36  + MAU: Lab Results  Component Value Date   MICRALBCREAT 32.0 (H) 02/11/2016  04/2014: ACR: >300 She sees nephrology. Intolerant to ACEI/ARBs.  -+ HL; last set of lipids: 09/21/2019: 236/255/37/151 Lab Results  Component Value Date   CHOL 193 09/24/2018   HDL 36.30 (L) 09/24/2018   LDLCALC 108 (H) 05/08/2015   LDLDIRECT 144.0 09/24/2018   TRIG 221.0 (H) 09/24/2018   CHOLHDL 5 09/24/2018  07/09/2016: 175/122/36/150 On Lipitor 40. - last eye exam was in 2022: + DR. She had cataract surgery since last visit. -She denies numbness and tingling in her feet.  She was seeing Dr. Narda Amber >> investigation for disequllibrium and word difficulty. ? MS. She also has HTN,  anemia, GERD.  During investigation of her back pain in 02/2020, she was found to have diverticulitis.  This was treated.  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  ROS: Constitutional: no weight gain/no weight loss, no fatigue, no subjective hyperthermia, no subjective hypothermia Eyes: no blurry vision, no xerophthalmia ENT: no sore throat, no nodules palpated in neck, no dysphagia, no odynophagia, no hoarseness Cardiovascular: no CP/no SOB/no palpitations/no leg swelling Respiratory: no cough/no SOB/no wheezing Gastrointestinal: no N/no V/no D/no C/no acid reflux Musculoskeletal: + muscle aches/+ joint aches (back) Skin: no rashes, no hair loss Neurological: no tremors/no numbness/no tingling/no dizziness  I reviewed pt's medications, allergies, PMH, social hx, family hx, and changes were documented in the history of present illness. Otherwise, unchanged from my initial visit note.  Past Medical History:  Diagnosis Date  . Anemia 04/17/11   "long, long years ago"  . Angina 04/16/11   "that's what I'm here for"  . Anxiety   . Asthma   . Carpal tunnel syndrome   . Cataracts, bilateral   . CKD (chronic kidney disease), stage III  ([place])   . Complication of anesthesia 1987   "affected my eyes; couldn't see anything but blurrs even the next day"  . CVA (cerebral infarction)    After cardiac catheter 02/2000  . Depression   . Edema   . Fibromyalgia   . GERD (gastroesophageal reflux disease)   . Headache(784.0)   . High cholesterol   . History of bronchitis   . Hypertension   . IBS (irritable bowel syndrome)   . Migraines   . Panic attacks 04/17/11   "don't take anything for it"  . Pneumonia 04/17/11   "probably as many as 7 times"  . Renal disorder 04/17/11   "they are working at 60% capacity"  . Shortness of breath on exertion    "cause of  my asthma"  . Stroke [facility]) 2002   residual "problem w/using the right word, left 5 lesions on my brain/MRI; long term memory loss"  . Type II diabetes mellitus ([place])    Past Surgical History:  Procedure Laterality Date  . CARDIAC CATHETERIZATION  2002  . CARPAL TUNNEL RELEASE  2003-2010   "twice on left; once on right"  . CHOLECYSTECTOMY  1987  . DEBRIDEMENT TENNIS ELBOW  2010  . VAGINAL HYSTERECTOMY  1977   Social History   Social History  . Marital Status: Married   Social History Main Topics  . Smoking status: Former Smoker -- 0.75 packs/day for 6 years    Types: Cigarettes    Quit date: 01/21/1980  . Smokeless tobacco: Never Used  . Alcohol Use: No  . Drug Use: No   Social History Narrative   Lives with husband in a one story home.  Has 2 children.  Retired from [employer].  Education: 12th grade.  Trade schools.    Current Outpatient Medications on File Prior to Visit  Medication Sig Dispense Refill  . albuterol (PROVENTIL HFA;VENTOLIN HFA) 108 (90 BASE) MCG/ACT inhaler Inhale 2 puffs into the lungs every 6 (six) hours as needed. For shortness of breath    . aspirin EC 81 MG tablet Take 81 mg by mouth daily.    Marland Kitchen atorvastatin (LIPITOR) 40 MG tablet     . Blood Glucose Monitoring Suppl (ONETOUCH VERIO) w/Device KIT 1 each by Does not apply route  daily. 1 kit 0  . cefdinir (OMNICEF) 300 MG capsule Take 1 capsule (300 mg total) by mouth 2 (two) times daily. 14 capsule 0  . furosemide (LASIX) 20 MG tablet Take 20 mg by mouth 2 (two) times daily.    . insulin regular (NOVOLIN R) 100 units/mL injection 30 units    . Insulin Syringe-Needle U-100 (RELION INSULIN SYRINGE) 31G X 15/64" 1 ML MISC Use 3 times daily.. 300 each 2  . ONETOUCH VERIO test strip USE 1 STRIP TO CHECK GLUCOSE THREE TIMES DAILY 200 each 11  . ranitidine (ZANTAC) 300 MG tablet Take 300 mg by mouth 2 (two) times daily.    Marland Kitchen RELION INSULIN SYR 0.5ML/31G 31G X 5/16" 0.5 ML MISC USE  THREE TIMES DAILY 300 each 0  . Syringe/Needle, Disp, (SYRINGE LUER LOCK) 23G X 1" 3 ML MISC 1 Package by Does not apply route as needed. 50 each 0  . Vitamin D, Cholecalciferol, 1000 UNITS TABS Take 2,000 Units by mouth daily.      No current facility-administered medications on file prior to visit.   Allergies  Allergen Reactions  . Codeine Other (See Comments)    "makes me crazy; see things; delusional"  . Erythromycin Other (See Comments)    "peeled skin; like a sunburn & I turn the color of the pill; a kind of purple-look"  . Lisinopril Cough    "cause I have asthma"  . Penicillins Rash and Other (See Comments)    "puffy blisters  . Sulfonamide Derivatives Hives    "watery hives"  . Metoprolol Diarrhea and Nausea And Vomiting    Rapid weight gain.  . Actos [Pioglitazone]     Upset GI  . Amlodipine     Syncope  . Benicar [Olmesartan] Other (See Comments)    Dizziness and HA  . Byetta 10 Mcg Pen [Exenatide] Nausea And Vomiting    5 MCG pen  . Glimepiride     Upset GI  . Glipizide  Upset GI  . Glyburide-Metformin     Myalgias  . Humalog [Insulin Lispro]     Headache, SEVERE HYPERGLYCEMIA  . Invokana [Canagliflozin]     Yeast infections  . Januvia [Sitagliptin]     uti  . Reglan [Metoclopramide]     unknown  . Septra [Sulfamethoxazole-Trimethoprim] Hives  .  Spironolactone     unk  . Victoza [Liraglutide] Diarrhea    Abdominal pain   . Zocor [Simvastatin]     unknown  . Losartan Potassium Rash    Upset GI  . Novolin R [Insulin] Swelling and Rash    70/30   Family History  Problem Relation Age of Onset  . Coronary artery disease Father   . Diabetes Mellitus I Father   . CVA Mother   . Stroke Mother   . Diabetes Mellitus I Mother   . Diabetes Mellitus I Sister   . Diabetes Mellitus I Brother   . Diabetes Mellitus I Maternal Grandmother   . Diabetes Mellitus I Maternal Grandfather   . Diabetes Mellitus I Paternal Grandmother   . Diabetes Mellitus I Paternal Grandfather   . Diabetes Mellitus I Brother   . Aortic aneurysm Son    PE: BP 128/82 (BP Location: Right Arm, Patient Position: Sitting, Cuff Size: Normal)   Pulse 85   Ht 5' 3.5" (1.613 m)   Wt 193 lb (87.5 kg)   SpO2 97%   BMI 33.65 kg/m  Body mass index is 33.65 kg/m. Wt Readings from Last 3 Encounters:  05/22/20 193 lb (87.5 kg)  04/17/20 195 lb (88.5 kg)  09/24/18 199 lb (90.3 kg)   Constitutional: overweight, in NAD Eyes: PERRLA, EOMI, no exophthalmos ENT: moist mucous membranes, no thyromegaly, no cervical lymphadenopathy Cardiovascular: RRR, No MRG Respiratory: CTA B Gastrointestinal: abdomen soft, NT, ND, BS+ Musculoskeletal: no deformities, strength intact in all 4 Skin: moist, warm, no rashes Neurological: no tremor with outstretched hands, DTR normal in all 4  ASSESSMENT: 1. DM2, insulin-dependent, uncontrolled, with complications - CKD stage 3 - h/o stroke post cardiac cath in 2002 - macroalbuminuria >> improved - Lipoatrophy at the site of insulin injections  2. Obesity class 2 BMI Classification:  < 18.5 underweight   18.5-24.9 normal weight   25.0-29.9 overweight   30.0-34.9 class I obesity   35.0-39.9 class II obesity   ? 40.0 class III obesity   3. HL  PLAN:  1. Patient with longstanding, uncontrolled, type 2 diabetes,  insulin-dependent, on a basal-bolus insulin regimen, returning at last visit after long absence of 1.5 years.  Sugars are higher, in the 500s while on steroids, despite increasing her insulin doses.  At that time, we discussed that if she did not follow recommendations regarding follow-up, I would not be able to help her.  -We discussed about options for treatment at that time.  We could not use a GLP-1 receptor agonist due to history of pancreatitis.  Invokana caused dizziness, fatigue, yeast infections in the past.  We did discuss about improving diet in the past and I suggested a more plant-based diet or weight watchers or even gastric bypass surgery.  She declined all of these. -At last visit, upon questioning, she misunderstood instructions and stopped Lantus completely and she was trying to control her blood sugars only with regular insulin.  Sugars in the morning were in the 150s and increasing to 400s later in the day despite repeated boluses.  We added back Lantus insulin and advised her how to titrate it  up.  HbA1c at that time was 9.1%. -At this visit, she tells me that she had to back off the Lantus from 30 to 20 units as sugars were dropping too low.  However, she continues on a high dose of regular insulin 34 units before meals.  At this visit, per review of her detailed log sugars are still mostly very low, from 40s to 60s but alternating with sugars at goal and occasional high blood sugars mostly when she tries to correct the low.  I explained that Lantus is most likely not the culprit for her lower blood sugars, but she is now on too much regular insulin, after her steroid is out of her system.  Therefore, we will drop her dose of regular insulin to approximately half and we will try to change up the Lantus dose.  I explained that a more physiologic insulin regimen will have approximately equal doses of Lantus as regular insulin per day -I advised her to let me know if her sugars remain low -I  also recommend a CGM and gave her a list of suppliers - I advised her to:  Patient Instructions  Please increase; - Lantus 25 units in am (try to increase to 30 units if no lows in 4-5 days)  Please decrease: - R insulin: 15-20 units 30 min before meals.  The most common suppliers for the continuous glucose monitor (FreeStyle [place] or [place]) are:  [employer] [phone], extension [phone]  -[employer] [phone]  [employer] [place] [phone]    Please return in 1.5 month with your sugar log.    - advised to check sugars at different times of the day - 4x a day, rotating check times - advised for yearly eye exams >> she is UTD - return to clinic in 2 months  2. . Obesity class 2 -She is on insulin, which is weight inducing -However, no weight gain at this visit, she in fact lost 2 pounds  3. HL -Reviewed latest lipid panel from 09/2019: LDL high, triglycerides also elevated -Continues Lipitor 40 mg daily without side effects  Philemon Kingdom, MD PhD [facility] Endocrinology

## 2020-05-22 NOTE — Patient Instructions (Addendum)
Please increase; - Lantus 25 units in am (try to increase to 30 units if no lows in 4-5 days)  Please decrease: - R insulin: 15-20 units 30 min before meals.  The most common suppliers for the continuous glucose monitor (FreeStyle [place] or [place]) are:  [employer] [phone], extension [phone]  -[employer] [phone]  [employer] [phone]  [facility] [phone]    Please return in 1.5 month with your sugar log.

## 2020-05-28 ENCOUNTER — Other Ambulatory Visit: Payer: Self-pay | Admitting: Neurological Surgery

## 2020-05-29 NOTE — Progress Notes (Signed)
Surgical Instructions    Your procedure is scheduled on Jun 01, 2020.  Report to [facility] Main Entrance "A" at 05:30 A.M., then check in with the Admitting office.  Call this number if you have problems the morning of surgery:  [phone]   If you have any questions prior to your surgery date call [phone]: Open Monday-Friday 8am-4pm   Remember:  Do not eat after midnight the night before your surgery   Take these medicines the morning of surgery with A SIP OF WATER : Atorvastatin (Lipitor)  If needed: Albuterol Inhaler-- please bring all inhalers with you the day of surgery  As of today, STOP taking any Aspirin (unless otherwise instructed by your surgeon) Aleve, Naproxen, Ibuprofen, Motrin, Advil, Goody's, BC's, all herbal medications, fish oil, and all vitamins.   WHAT DO I DO ABOUT MY DIABETES MEDICATION?   Marland Kitchen Do not take oral diabetes medicines (pills) the morning of surgery.  . THE DAY BEFORE SURGERY, you make take your Regular Insulin (NOVOLIN R) without change at meals.  . THE NIGHT BEFORE SURGERY, take at HALF DOSE OF LANTUS which is 15 units.   . The day of surgery, do not take other diabetes injectables, including Byetta (exenatide), Bydureon (exenatide ER), Victoza (liraglutide), or Trulicity (dulaglutide).  . If your CBG is greater than 220 mg/dL, you may take  of your sliding scale (correction) dose of insulin.   HOW TO MANAGE YOUR DIABETES BEFORE AND AFTER SURGERY  Why is it important to control my blood sugar before and after surgery? . Improving blood sugar levels before and after surgery helps healing and can limit problems. . A way of improving blood sugar control is eating a healthy diet by: o  Eating less sugar and carbohydrates o  Increasing activity/exercise o  Talking with your doctor about reaching your blood sugar goals . High blood sugars (greater than 180 mg/dL) can raise your risk of infections and slow your recovery, so you will need  to focus on controlling your diabetes during the weeks before surgery. . Make sure that the doctor who takes care of your diabetes knows about your planned surgery including the date and location.  How do I manage my blood sugar before surgery? . Check your blood sugar at least 4 times a day, starting 2 days before surgery, to make sure that the level is not too high or low. . Check your blood sugar the morning of your surgery when you wake up and every 2 hours until you get to the Short Stay unit. o If your blood sugar is less than 70 mg/dL, you will need to treat for low blood sugar: - Do not take insulin. - Treat a low blood sugar (less than 70 mg/dL) with  cup of clear juice (cranberry or apple), 4 glucose tablets, OR glucose gel. - Recheck blood sugar in 15 minutes after treatment (to make sure it is greater than 70 mg/dL). If your blood sugar is not greater than 70 mg/dL on recheck, call [phone] for further instructions. . Report your blood sugar to the short stay nurse when you get to Short Stay.  . If you are admitted to the hospital after surgery: o Your blood sugar will be checked by the staff and you will probably be given insulin after surgery (instead of oral diabetes medicines) to make sure you have good blood sugar levels. o The goal for blood sugar control after surgery is 80-180 mg/dL.  Do not wear jewelry, make up, or nail polish            Do not wear lotions, powders, perfumes, or deodorant.            Do not shave 48 hours prior to surgery.              Do not bring valuables to the hospital.            [facility] is not responsible for any belongings or valuables.  Do NOT Smoke (Tobacco/Vaping) or drink Alcohol 24 hours prior to your procedure If you use a CPAP at night, you may bring all equipment for your overnight stay.   Contacts, glasses, dentures or bridgework may not be worn into surgery, please bring cases for these belongings   For  patients admitted to the hospital, discharge time will be determined by your treatment team.   Patients discharged the day of surgery will not be allowed to drive home, and someone needs to stay with them for 24 hours.    Special instructions:   [place]- Preparing For Surgery  Before surgery, you can play an important role. Because skin is not sterile, your skin needs to be as free of germs as possible. You can reduce the number of germs on your skin by washing with CHG (chlorahexidine gluconate) Soap before surgery.  CHG is an antiseptic cleaner which kills germs and bonds with the skin to continue killing germs even after washing.    Oral Hygiene is also important to reduce your risk of infection.  Remember - BRUSH YOUR TEETH THE MORNING OF SURGERY WITH YOUR REGULAR TOOTHPASTE  Please do not use if you have an allergy to CHG or antibacterial soaps. If your skin becomes reddened/irritated stop using the CHG.  Do not shave (including legs and underarms) for at least 48 hours prior to first CHG shower. It is OK to shave your face.  Please follow these instructions carefully.   1. Shower the NIGHT BEFORE SURGERY and the MORNING OF SURGERY  2. If you chose to wash your hair, wash your hair first as usual with your normal shampoo.  3. After you shampoo, rinse your hair and body thoroughly to remove the shampoo.  4. Wash Face and genitals (private parts) with your normal soap.   5.  Shower the NIGHT BEFORE SURGERY and the MORNING OF SURGERY with CHG Soap.   6. Use CHG Soap as you would any other liquid soap. You can apply CHG directly to the skin and wash gently with a scrungie or a clean washcloth.   7. Apply the CHG Soap to your body ONLY FROM THE NECK DOWN.  Do not use on open wounds or open sores. Avoid contact with your eyes, ears, mouth and genitals (private parts). Wash Face and genitals (private parts)  with your normal soap.   8. Wash thoroughly, paying special attention to  the area where your surgery will be performed.  9. Thoroughly rinse your body with warm water from the neck down.  10. DO NOT shower/wash with your normal soap after using and rinsing off the CHG Soap.  11. Pat yourself dry with a CLEAN TOWEL.  12. Wear CLEAN PAJAMAS to bed the night before surgery  13. Place CLEAN SHEETS on your bed the night before your surgery  14. DO NOT SLEEP WITH PETS.   Day of Surgery: Take a shower with CHG soap. Wear Clean/Comfortable clothing the morning of surgery  Do not apply any deodorants/lotions.   Remember to brush your teeth WITH YOUR REGULAR TOOTHPASTE.   Please read over the following fact sheets that you were given.

## 2020-05-30 ENCOUNTER — Encounter (HOSPITAL_COMMUNITY): Payer: Self-pay

## 2020-05-30 ENCOUNTER — Encounter (HOSPITAL_COMMUNITY)
Admission: RE | Admit: 2020-05-30 | Discharge: 2020-05-30 | Disposition: A | Discharge status: Home / Self Care | Payer: Medicare Other | Source: Ambulatory Visit | Attending: Neurological Surgery | Admitting: Neurological Surgery

## 2020-05-30 ENCOUNTER — Other Ambulatory Visit: Payer: Self-pay

## 2020-05-30 DIAGNOSIS — Z20822 Contact with and (suspected) exposure to covid-19: Secondary | ICD-10-CM | POA: Insufficient documentation

## 2020-05-30 DIAGNOSIS — Z01818 Encounter for other preprocedural examination: Secondary | ICD-10-CM | POA: Insufficient documentation

## 2020-05-30 HISTORY — DX: Unspecified osteoarthritis, unspecified site: M19.90

## 2020-05-30 LAB — CBC
HCT: 44.1 % (ref 36.0–46.0)
Hemoglobin: 14.1 g/dL (ref 12.0–15.0)
MCH: 30.3 pg (ref 26.0–34.0)
MCHC: 32 g/dL (ref 30.0–36.0)
MCV: 94.8 fL (ref 80.0–100.0)
Platelets: ADEQUATE 10*3/uL (ref 150–400)
RBC: 4.65 MIL/uL (ref 3.87–5.11)
RDW: 13.4 % (ref 11.5–15.5)
WBC: 8.1 10*3/uL (ref 4.0–10.5)
nRBC: 0 % (ref 0.0–0.2)

## 2020-05-30 LAB — BASIC METABOLIC PANEL
Anion gap: 7 (ref 5–15)
BUN: 16 mg/dL (ref 8–23)
CO2: 29 mmol/L (ref 22–32)
Calcium: 9.4 mg/dL (ref 8.9–10.3)
Chloride: 104 mmol/L (ref 98–111)
Creatinine, Ser: 1.2 mg/dL — ABNORMAL HIGH (ref 0.44–1.00)
GFR, Estimated: 47 mL/min — ABNORMAL LOW (ref >60)
Glucose, Bld: 157 mg/dL — ABNORMAL HIGH (ref 70–99)
Potassium: 4.6 mmol/L (ref 3.5–5.1)
Sodium: 140 mmol/L (ref 135–145)

## 2020-05-30 LAB — SURGICAL PCR SCREEN
MRSA, PCR: NEGATIVE
Staphylococcus aureus: NEGATIVE

## 2020-05-30 LAB — GLUCOSE, CAPILLARY: Glucose-Capillary: 163 mg/dL — ABNORMAL HIGH (ref 70–99)

## 2020-05-30 LAB — SARS CORONAVIRUS 2 (TAT 6-24 HRS): SARS Coronavirus 2: NEGATIVE

## 2020-05-30 LAB — HEMOGLOBIN A1C
Hgb A1c MFr Bld: 8 % — ABNORMAL HIGH (ref 4.8–5.6)
Mean Plasma Glucose: 182.9 mg/dL

## 2020-05-30 NOTE — Progress Notes (Signed)
Surgical Instructions    Your procedure is scheduled on Jun 01, 2020.  Report to [facility] Main Entrance "A" at 05:30 A.M., then check in with the Admitting office.  Call this number if you have problems the morning of surgery:  [phone]   If you have any questions prior to your surgery date call [phone]: Open Monday-Friday 8am-4pm   Remember:  Do not eat or drink after midnight the night before your surgery   Take these medicines the morning of surgery with A SIP OF WATER : Atorvastatin (Lipitor)  If needed: Albuterol Inhaler-- please bring all inhalers with you the day of surgery  As of today, STOP taking any Aspirin (unless otherwise instructed by your surgeon) Aleve, Naproxen, Ibuprofen, Motrin, Advil, Goody's, BC's, all herbal medications, fish oil, and all vitamins.   WHAT DO I DO ABOUT MY DIABETES MEDICATION?   Marland Kitchen Do not take oral diabetes medicines (pills) the morning of surgery.  . THE DAY BEFORE SURGERY, you make take your Regular Insulin (NOVOLIN R) without change at meals.  . THE NIGHT BEFORE SURGERY, take at HALF DOSE OF LANTUS which is 15 units.   . If your CBG is greater than 220 mg/dL, you may take  of your sliding scale (correction) dose of insulin.   HOW TO MANAGE YOUR DIABETES BEFORE AND AFTER SURGERY  Why is it important to control my blood sugar before and after surgery? . Improving blood sugar levels before and after surgery helps healing and can limit problems. . A way of improving blood sugar control is eating a healthy diet by: o  Eating less sugar and carbohydrates o  Increasing activity/exercise o  Talking with your doctor about reaching your blood sugar goals . High blood sugars (greater than 180 mg/dL) can raise your risk of infections and slow your recovery, so you will need to focus on controlling your diabetes during the weeks before surgery. . Make sure that the doctor who takes care of your diabetes knows about your planned  surgery including the date and location.  How do I manage my blood sugar before surgery? . Check your blood sugar at least 4 times a day, starting 2 days before surgery, to make sure that the level is not too high or low. . Check your blood sugar the morning of your surgery when you wake up and every 2 hours until you get to the Short Stay unit. o If your blood sugar is less than 70 mg/dL, you will need to treat for low blood sugar: - Do not take insulin. - Treat a low blood sugar (less than 70 mg/dL) with  cup of clear juice (cranberry or apple), 4 glucose tablets, OR glucose gel. - Recheck blood sugar in 15 minutes after treatment (to make sure it is greater than 70 mg/dL). If your blood sugar is not greater than 70 mg/dL on recheck, call [phone] for further instructions. . Report your blood sugar to the short stay nurse when you get to Short Stay.  . If you are admitted to the hospital after surgery: o Your blood sugar will be checked by the staff and you will probably be given insulin after surgery (instead of oral diabetes medicines) to make sure you have good blood sugar levels. o The goal for blood sugar control after surgery is 80-180 mg/dL.                      Do not wear jewelry, make up,  or nail polish            Do not wear lotions, powders, perfumes, or deodorant.            Do not shave 48 hours prior to surgery.              Do not bring valuables to the hospital.            [facility] is not responsible for any belongings or valuables.  Do NOT Smoke (Tobacco/Vaping) or drink Alcohol 24 hours prior to your procedure If you use a CPAP at night, you may bring all equipment for your overnight stay.   Contacts, glasses, dentures or bridgework may not be worn into surgery, please bring cases for these belongings   For patients admitted to the hospital, discharge time will be determined by your treatment team.   Patients discharged the day of surgery will not be allowed  to drive home, and someone needs to stay with them for 24 hours.    Special instructions:   [place]- Preparing For Surgery  Before surgery, you can play an important role. Because skin is not sterile, your skin needs to be as free of germs as possible. You can reduce the number of germs on your skin by washing with CHG (chlorahexidine gluconate) Soap before surgery.  CHG is an antiseptic cleaner which kills germs and bonds with the skin to continue killing germs even after washing.    Oral Hygiene is also important to reduce your risk of infection.  Remember - BRUSH YOUR TEETH THE MORNING OF SURGERY WITH YOUR REGULAR TOOTHPASTE  Please do not use if you have an allergy to CHG or antibacterial soaps. If your skin becomes reddened/irritated stop using the CHG.  Do not shave (including legs and underarms) for at least 48 hours prior to first CHG shower. It is OK to shave your face.  Please follow these instructions carefully.   1. Shower the NIGHT BEFORE SURGERY and the MORNING OF SURGERY  2. If you chose to wash your hair, wash your hair first as usual with your normal shampoo.  3. After you shampoo, rinse your hair and body thoroughly to remove the shampoo.  4. Wash Face and genitals (private parts) with your normal soap.   5.  Shower the NIGHT BEFORE SURGERY and the MORNING OF SURGERY with CHG Soap.   6. Use CHG Soap as you would any other liquid soap. You can apply CHG directly to the skin and wash gently with a scrungie or a clean washcloth.   7. Apply the CHG Soap to your body ONLY FROM THE NECK DOWN.  Do not use on open wounds or open sores. Avoid contact with your eyes, ears, mouth and genitals (private parts). Wash Face and genitals (private parts)  with your normal soap.   8. Wash thoroughly, paying special attention to the area where your surgery will be performed.  9. Thoroughly rinse your body with warm water from the neck down.  10. DO NOT shower/wash with your normal  soap after using and rinsing off the CHG Soap.  11. Pat yourself dry with a CLEAN TOWEL.  12. Wear CLEAN PAJAMAS to bed the night before surgery  13. Place CLEAN SHEETS on your bed the night before your surgery  14. DO NOT SLEEP WITH PETS.   Day of Surgery: Take a shower with CHG soap. Wear Clean/Comfortable clothing the morning of surgery Do not apply any deodorants/lotions.  Remember to brush your teeth WITH YOUR REGULAR TOOTHPASTE.   Please read over the following fact sheets that you were given.

## 2020-05-30 NOTE — Progress Notes (Addendum)
PCP - Johny Blamer MD Cardiologist - Denies Internal Med- Diabetes - [facility] Nephrology - Crista Elliot. MD PPM/ICD - Denies  Chest x-ray - N/A EKG - 05/30/2020 Stress Test - 10/27/2016 ECHO - 10/27/2016 Cardiac Cath - 2002  Sleep Study - Denies CPAP - N/A  Fasting Blood Sugar - 80's-90's Checks Blood Sugar _6__ times a day  Blood Thinner Instructions: N/A Aspirin Instructions: Stop today unless told otherwise by MD  ERAS Protcol - No   COVID TEST- 05/30/2020   Anesthesia review: Yes, cardiac hx.  Abnormal EKG Left BBB on PAT.  Patient denies shortness of breath, fever, cough and chest pain at PAT appointment   All instructions explained to the patient, with a verbal understanding of the material. Patient agrees to go over the instructions while at home for a better understanding. Patient also instructed to self quarantine after being tested for COVID-19. The opportunity to ask questions was provided.

## 2020-05-31 NOTE — Progress Notes (Signed)
Anesthesia Chart Review:  Follows with endocrinology for history of IDDM 2, uncontrolled, with complications (CKD stage III, H/O stroke postcardiac cath 2002, microalbuminuria).  A1c 8.0 on preop labs 05/30/2020, which is an improvement from prior 9.1 on 04/17/2020.  Patient was evaluated by cardiology in 2018 for syncopal episode that occurred after taking a hot shower.  It was felt that her symptoms were likely related to vasovagal or orthostatic hypotension.  However, she was noted to have abnormal EKG with incomplete LBBB and septal infarct.  Lexiscan was ordered to rule out ischemia and an echo was ordered to assess for structural heart disease.  30-day monitor was ordered to rule out arrhythmia.  Nuclear stress 10/27/2016 was low risk, nonischemic.  Echo 10/27/2016 showed EF 50-55% with mild aortic regurgitation.  Event monitor 10/23/2016 showed normal sinus rhythm with an average heart rate of 71 bpm.  Preop labs reviewed, creatinine mildly elevated 1.2, otherwise unremarkable.  EKG 05/30/2020: NSR.  Rate 73.  LAD.  Nonspecific intraventricular conduction delay.  Nuclear stress 10/27/2016:  Nuclear stress EF: 58%.  There was no ST segment deviation noted during stress.  Defect 1: There is a medium defect of moderate severity present in the mid anteroseptal, apical septal and apex location.  This is a low risk study.  The left ventricular ejection fraction is normal (55-65%).   Low risk, probably normal stress nuclear study with septal/apical defect possibly related to conduction abnormality; no ischemia; EF 58 with normal wall motion.  TTE 10/27/2016: Study Conclusions   - Left ventricle: The cavity size was normal. Wall thickness was  normal. Systolic function was normal. The estimated ejection  fraction was in the range of 50% to 55%. Doppler parameters are  consistent with abnormal left ventricular relaxation (grade 1  diastolic dysfunction).  - Aortic valve: There was  mild regurgitation.   Event monitor 10/20/2016: Normal sinus rhythm with an average heart rate of 71bpm   Yolanda Saunders [facility] Short Stay Center/Anesthesiology Phone [phone] 05/31/2020 9:26 AM

## 2020-05-31 NOTE — Anesthesia Preprocedure Evaluation (Addendum)
Anesthesia Evaluation  Patient identified by MRN, date of birth, ID band Patient awake    Reviewed: Allergy & Precautions, NPO status , Patient's Chart, lab work & pertinent test results  History of Anesthesia Complications Negative for: history of anesthetic complications  Airway Mallampati: III  TM Distance: >3 FB Neck ROM: Full    Dental  (+) Dental Advisory Given   Pulmonary asthma , former smoker,  Covid-19 Nucleic Acid Test Results Lab Results      Component                Value               Date                      SARSCOV2NAA              NEGATIVE            05/30/2020              breath sounds clear to auscultation       Cardiovascular hypertension, Pt. on medications (-) angina Rhythm:Regular  Left ventricle: The cavity size was normal. Wall thickness was  normal. Systolic function was normal. The estimated ejection  fraction was in the range of 50% to 55%. Doppler parameters are  consistent with abnormal left ventricular relaxation (grade 1  diastolic dysfunction).  - Aortic valve: There was mild regurgitation.     Nuclear stress EF: 58%.  There was no ST segment deviation noted during stress.  Defect 1: There is a medium defect of moderate severity present in the mid anteroseptal, apical septal and apex location.  This is a low risk study.  The left ventricular ejection fraction is normal (55-65%).   Low risk, probably normal stress nuclear study with septal/apical defect possibly related to conduction abnormality; no ischemia; EF 58 with normal wall motion.    Neuro/Psych  Headaches, PSYCHIATRIC DISORDERS Anxiety Depression residual "problem w/using the right word, left 5 lesions on my brain/MRI; long term memory loss"  Neuromuscular disease CVA    GI/Hepatic Neg liver ROS, GERD  ,  Endo/Other  diabetes, Insulin Dependent  Renal/GU CRFRenal diseaseLab Results      Component                 Value               Date                      CREATININE               1.20 (H)            05/30/2020                Musculoskeletal  (+) Arthritis , Fibromyalgia -  Abdominal   Peds  Hematology   Anesthesia Other Findings   Reproductive/Obstetrics                            Anesthesia Physical Anesthesia Plan  ASA: III  Anesthesia Plan: General   Post-op Pain Management:    Induction: Intravenous  PONV Risk Score and Plan: 3 and Ondansetron and Dexamethasone  Airway Management Planned: Oral ETT  Additional Equipment: None  Intra-op Plan:   Post-operative Plan: Extubation in OR  Informed Consent: I have reviewed the patients History and Physical, chart, labs and discussed the  procedure including the risks, benefits and alternatives for the proposed anesthesia with the patient or authorized representative who has indicated his/her understanding and acceptance.     Dental advisory given  Plan Discussed with: CRNA and Surgeon  Anesthesia Plan Comments: (PAT note by Antionette Poles, PA-C: Follows with endocrinology for history of IDDM 2, uncontrolled, with complications (CKD stage III, H/O stroke postcardiac cath 2002, microalbuminuria).  A1c 8.0 on preop labs 05/30/2020, which is an improvement from prior 9.1 on 04/17/2020.  Patient was evaluated by cardiology in 2018 for syncopal episode that occurred after taking a hot shower.  It was felt that her symptoms were likely related to vasovagal or orthostatic hypotension.  However, she was noted to have abnormal EKG with incomplete LBBB and septal infarct.  Lexiscan was ordered to rule out ischemia and an echo was ordered to assess for structural heart disease.  30-day monitor was ordered to rule out arrhythmia.  Nuclear stress 10/27/2016 was low risk, nonischemic.  Echo 10/27/2016 showed EF 50-55% with mild aortic regurgitation.  Event monitor 10/23/2016 showed normal sinus rhythm with an average heart rate  of 71 bpm.  Preop labs reviewed, creatinine mildly elevated 1.2, otherwise unremarkable.  EKG 05/30/2020: NSR.  Rate 73.  LAD.  Nonspecific intraventricular conduction delay.  Nuclear stress 10/27/2016: Nuclear stress EF: 58%. There was no ST segment deviation noted during stress. Defect 1: There is a medium defect of moderate severity present in the mid anteroseptal, apical septal and apex location. This is a low risk study. The left ventricular ejection fraction is normal (55-65%).   Low risk, probably normal stress nuclear study with septal/apical defect possibly related to conduction abnormality; no ischemia; EF 58 with normal wall motion.  TTE 10/27/2016: Study Conclusions   - Left ventricle: The cavity size was normal. Wall thickness was  normal. Systolic function was normal. The estimated ejection  fraction was in the range of 50% to 55%. Doppler parameters are  consistent with abnormal left ventricular relaxation (grade 1  diastolic dysfunction).  - Aortic valve: There was mild regurgitation.   Event monitor 10/20/2016: Normal sinus rhythm with an average heart rate of 71bpm )       Anesthesia Quick Evaluation

## 2020-06-01 ENCOUNTER — Observation Stay (HOSPITAL_COMMUNITY)
Admission: RE | Admit: 2020-06-01 | Discharge: 2020-06-02 | Disposition: A | Discharge status: Home / Self Care | Payer: Medicare Other | Attending: Neurological Surgery | Admitting: Neurological Surgery

## 2020-06-01 ENCOUNTER — Other Ambulatory Visit: Payer: Self-pay

## 2020-06-01 ENCOUNTER — Ambulatory Visit (HOSPITAL_COMMUNITY): Payer: Medicare Other | Admitting: Certified Registered"

## 2020-06-01 ENCOUNTER — Encounter (HOSPITAL_COMMUNITY)
Admission: RE | Disposition: A | Discharge status: Home / Self Care | Payer: Self-pay | Source: Home / Self Care | Attending: Neurological Surgery

## 2020-06-01 ENCOUNTER — Ambulatory Visit (HOSPITAL_COMMUNITY): Payer: Medicare Other | Admitting: Physician Assistant

## 2020-06-01 ENCOUNTER — Ambulatory Visit (HOSPITAL_COMMUNITY): Payer: Medicare Other

## 2020-06-01 ENCOUNTER — Encounter (HOSPITAL_COMMUNITY): Payer: Self-pay | Admitting: Neurological Surgery

## 2020-06-01 DIAGNOSIS — M5416 Radiculopathy, lumbar region: Secondary | ICD-10-CM | POA: Diagnosis present

## 2020-06-01 DIAGNOSIS — J45909 Unspecified asthma, uncomplicated: Secondary | ICD-10-CM | POA: Diagnosis not present

## 2020-06-01 DIAGNOSIS — M5116 Intervertebral disc disorders with radiculopathy, lumbar region: Secondary | ICD-10-CM | POA: Diagnosis not present

## 2020-06-01 DIAGNOSIS — Z419 Encounter for procedure for purposes other than remedying health state, unspecified: Secondary | ICD-10-CM

## 2020-06-01 DIAGNOSIS — I129 Hypertensive chronic kidney disease with stage 1 through stage 4 chronic kidney disease, or unspecified chronic kidney disease: Secondary | ICD-10-CM | POA: Diagnosis not present

## 2020-06-01 DIAGNOSIS — N183 Chronic kidney disease, stage 3 unspecified: Secondary | ICD-10-CM | POA: Insufficient documentation

## 2020-06-01 DIAGNOSIS — E119 Type 2 diabetes mellitus without complications: Secondary | ICD-10-CM | POA: Diagnosis not present

## 2020-06-01 DIAGNOSIS — Z794 Long term (current) use of insulin: Secondary | ICD-10-CM | POA: Diagnosis not present

## 2020-06-01 DIAGNOSIS — Z981 Arthrodesis status: Secondary | ICD-10-CM | POA: Diagnosis not present

## 2020-06-01 DIAGNOSIS — E1122 Type 2 diabetes mellitus with diabetic chronic kidney disease: Secondary | ICD-10-CM | POA: Diagnosis not present

## 2020-06-01 DIAGNOSIS — Z87891 Personal history of nicotine dependence: Secondary | ICD-10-CM | POA: Diagnosis not present

## 2020-06-01 HISTORY — PX: LUMBAR LAMINECTOMY/ DECOMPRESSION WITH MET-RX: SHX5959

## 2020-06-01 LAB — GLUCOSE, CAPILLARY
Glucose-Capillary: 102 mg/dL — ABNORMAL HIGH (ref 70–99)
Glucose-Capillary: 105 mg/dL — ABNORMAL HIGH (ref 70–99)
Glucose-Capillary: 137 mg/dL — ABNORMAL HIGH (ref 70–99)
Glucose-Capillary: 221 mg/dL — ABNORMAL HIGH (ref 70–99)
Glucose-Capillary: 246 mg/dL — ABNORMAL HIGH (ref 70–99)
Glucose-Capillary: 238 mg/dL — ABNORMAL HIGH (ref 70–99)
Glucose-Capillary: 215 mg/dL — ABNORMAL HIGH (ref 70–99)
Glucose-Capillary: 184 mg/dL — ABNORMAL HIGH (ref 70–99)

## 2020-06-01 SURGERY — LUMBAR LAMINECTOMY/ DECOMPRESSION WITH MET-RX
Anesthesia: General | Laterality: Right

## 2020-06-01 MED ORDER — CHLORHEXIDINE GLUCONATE CLOTH 2 % EX PADS: 6.0000 | MEDICATED_PAD | Freq: Once | CUTANEOUS | Status: DC

## 2020-06-01 MED ORDER — POLYETHYLENE GLYCOL 3350 17 G PO PACK: 17.0000 g | PACK | Freq: Every day | ORAL | Status: DC | PRN

## 2020-06-01 MED ORDER — LIDOCAINE 2% (20 MG/ML) 5 ML SYRINGE
INTRAMUSCULAR | Status: AC
  Filled 2020-06-01: qty 5

## 2020-06-01 MED ORDER — HYDROCHLOROTHIAZIDE 25 MG PO TABS: 25.0000 mg | ORAL_TABLET | ORAL | Status: DC

## 2020-06-01 MED ORDER — 0.9 % SODIUM CHLORIDE (POUR BTL) OPTIME
TOPICAL | Status: DC | PRN
  Administered 2020-06-01: 1000 mL

## 2020-06-01 MED ORDER — THROMBIN 5000 UNITS EX SOLR
CUTANEOUS | Status: AC
  Filled 2020-06-01: qty 5000

## 2020-06-01 MED ORDER — PROPOFOL 10 MG/ML IV BOLUS
INTRAVENOUS | Status: DC | PRN
  Administered 2020-06-01: 140 mg via INTRAVENOUS

## 2020-06-01 MED ORDER — ALBUTEROL SULFATE HFA 108 (90 BASE) MCG/ACT IN AERS
INHALATION_SPRAY | RESPIRATORY_TRACT | Status: AC
  Filled 2020-06-01: qty 6.7

## 2020-06-01 MED ORDER — DEXAMETHASONE SODIUM PHOSPHATE 10 MG/ML IJ SOLN
INTRAMUSCULAR | Status: AC
  Filled 2020-06-01: qty 1

## 2020-06-01 MED ORDER — LIDOCAINE-EPINEPHRINE 1 %-1:100000 IJ SOLN
INTRAMUSCULAR | Status: DC | PRN
  Administered 2020-06-01: 10 mL

## 2020-06-01 MED ORDER — PHENYLEPHRINE HCL-NACL 10-0.9 MG/250ML-% IV SOLN
INTRAVENOUS | Status: DC | PRN
  Administered 2020-06-01: 25 ug/min via INTRAVENOUS

## 2020-06-01 MED ORDER — ROCURONIUM BROMIDE 10 MG/ML (PF) SYRINGE
PREFILLED_SYRINGE | INTRAVENOUS | Status: DC | PRN
  Administered 2020-06-01: 15 mg via INTRAVENOUS
  Administered 2020-06-01: 60 mg via INTRAVENOUS

## 2020-06-01 MED ORDER — ONDANSETRON HCL 4 MG/2ML IJ SOLN
INTRAMUSCULAR | Status: DC | PRN
  Administered 2020-06-01: 4 mg via INTRAVENOUS

## 2020-06-01 MED ORDER — FENTANYL CITRATE (PF) 100 MCG/2ML IJ SOLN
25.0000 ug | INTRAMUSCULAR | Status: DC | PRN
  Administered 2020-06-01 (×2): 25 ug via INTRAVENOUS

## 2020-06-01 MED ORDER — ALBUTEROL SULFATE HFA 108 (90 BASE) MCG/ACT IN AERS
INHALATION_SPRAY | RESPIRATORY_TRACT | Status: DC | PRN
  Administered 2020-06-01: 2 via RESPIRATORY_TRACT

## 2020-06-01 MED ORDER — CHLORHEXIDINE GLUCONATE 0.12 % MT SOLN
15.0000 mL | Freq: Once | OROMUCOSAL | Status: AC
  Administered 2020-06-01: 15 mL via OROMUCOSAL

## 2020-06-01 MED ORDER — ACETAMINOPHEN 325 MG PO TABS: 650.0000 mg | ORAL_TABLET | ORAL | Status: DC | PRN

## 2020-06-01 MED ORDER — SODIUM CHLORIDE 0.9 % IV SOLN: 250.0000 mL | INTRAVENOUS | Status: DC

## 2020-06-01 MED ORDER — ONDANSETRON HCL 4 MG/2ML IJ SOLN: 4.0000 mg | Freq: Four times a day (QID) | INTRAMUSCULAR | Status: DC | PRN

## 2020-06-01 MED ORDER — ONDANSETRON HCL 4 MG PO TABS: 4.0000 mg | ORAL_TABLET | Freq: Four times a day (QID) | ORAL | Status: DC | PRN

## 2020-06-01 MED ORDER — VANCOMYCIN HCL IN DEXTROSE 1-5 GM/200ML-% IV SOLN: 1000.0000 mg | INTRAVENOUS | Status: AC

## 2020-06-01 MED ORDER — ACETAMINOPHEN 500 MG PO TABS: 1000.0000 mg | ORAL_TABLET | Freq: Once | ORAL | Status: DC | PRN

## 2020-06-01 MED ORDER — ACETAMINOPHEN 650 MG RE SUPP: 650.0000 mg | RECTAL | Status: DC | PRN

## 2020-06-01 MED ORDER — FENTANYL CITRATE (PF) 100 MCG/2ML IJ SOLN
INTRAMUSCULAR | Status: AC
  Filled 2020-06-01: qty 2

## 2020-06-01 MED ORDER — SODIUM CHLORIDE 0.9% FLUSH: 3.0000 mL | INTRAVENOUS | Status: DC | PRN

## 2020-06-01 MED ORDER — OXYCODONE HCL 5 MG/5ML PO SOLN: 5.0000 mg | Freq: Once | ORAL | Status: DC | PRN

## 2020-06-01 MED ORDER — PHENOL 1.4 % MT LIQD
1.0000 | OROMUCOSAL | Status: DC | PRN
Start: 2020-06-01 — End: 2020-06-02

## 2020-06-01 MED ORDER — MENTHOL 3 MG MT LOZG: 1.0000 | LOZENGE | OROMUCOSAL | Status: DC | PRN

## 2020-06-01 MED ORDER — OXYCODONE HCL 5 MG PO TABS: 5.0000 mg | ORAL_TABLET | ORAL | Status: DC | PRN

## 2020-06-01 MED ORDER — ACETAMINOPHEN 500 MG PO TABS
500.0000 mg | ORAL_TABLET | Freq: Four times a day (QID) | ORAL | Status: DC | PRN
  Administered 2020-06-01 (×2): 500 mg via ORAL
  Administered 2020-06-01 – 2020-06-02 (×3): 1000 mg via ORAL
  Filled 2020-06-01: qty 2
  Filled 2020-06-01: qty 1
  Filled 2020-06-01 (×2): qty 2
  Filled 2020-06-01: qty 1

## 2020-06-01 MED ORDER — FENTANYL CITRATE (PF) 250 MCG/5ML IJ SOLN
INTRAMUSCULAR | Status: AC
  Filled 2020-06-01: qty 5

## 2020-06-01 MED ORDER — ONDANSETRON HCL 4 MG/2ML IJ SOLN
INTRAMUSCULAR | Status: AC
  Filled 2020-06-01: qty 2

## 2020-06-01 MED ORDER — ORAL CARE MOUTH RINSE: 15.0000 mL | Freq: Once | OROMUCOSAL | Status: AC

## 2020-06-01 MED ORDER — OXYCODONE HCL 5 MG PO TABS
10.0000 mg | ORAL_TABLET | ORAL | Status: DC | PRN
Start: 2020-06-01 — End: 2020-06-01

## 2020-06-01 MED ORDER — PHENYLEPHRINE 40 MCG/ML (10ML) SYRINGE FOR IV PUSH (FOR BLOOD PRESSURE SUPPORT)
PREFILLED_SYRINGE | INTRAVENOUS | Status: AC
  Filled 2020-06-01: qty 10

## 2020-06-01 MED ORDER — INSULIN GLARGINE 100 UNIT/ML ~~LOC~~ SOLN
30.0000 [IU] | Freq: Every day | SUBCUTANEOUS | Status: DC
  Filled 2020-06-01 (×2): qty 0.3

## 2020-06-01 MED ORDER — CHLORHEXIDINE GLUCONATE 0.12 % MT SOLN
OROMUCOSAL | Status: AC
  Filled 2020-06-01: qty 15

## 2020-06-01 MED ORDER — FUROSEMIDE 40 MG PO TABS: 40.0000 mg | ORAL_TABLET | Freq: Every day | ORAL | Status: DC

## 2020-06-01 MED ORDER — ACETAMINOPHEN 160 MG/5ML PO SOLN: 1000.0000 mg | Freq: Once | ORAL | Status: DC | PRN

## 2020-06-01 MED ORDER — LIDOCAINE 2% (20 MG/ML) 5 ML SYRINGE
INTRAMUSCULAR | Status: DC | PRN
  Administered 2020-06-01: 60 mg via INTRAVENOUS

## 2020-06-01 MED ORDER — THROMBIN 5000 UNITS EX SOLR
OROMUCOSAL | Status: DC | PRN
  Administered 2020-06-01: 5 mL via TOPICAL

## 2020-06-01 MED ORDER — LACTATED RINGERS IV SOLN: INTRAVENOUS | Status: DC

## 2020-06-01 MED ORDER — PHENYLEPHRINE 40 MCG/ML (10ML) SYRINGE FOR IV PUSH (FOR BLOOD PRESSURE SUPPORT)
PREFILLED_SYRINGE | INTRAVENOUS | Status: DC | PRN
  Administered 2020-06-01: 40 ug via INTRAVENOUS
  Administered 2020-06-01: 80 ug via INTRAVENOUS

## 2020-06-01 MED ORDER — SUGAMMADEX SODIUM 200 MG/2ML IV SOLN
INTRAVENOUS | Status: DC | PRN
  Administered 2020-06-01: 360 mg via INTRAVENOUS

## 2020-06-01 MED ORDER — DOCUSATE SODIUM 100 MG PO CAPS
100.0000 mg | ORAL_CAPSULE | Freq: Two times a day (BID) | ORAL | Status: DC
  Administered 2020-06-01 (×2): 100 mg via ORAL
  Filled 2020-06-01 (×2): qty 1

## 2020-06-01 MED ORDER — DEXAMETHASONE SODIUM PHOSPHATE 10 MG/ML IJ SOLN
INTRAMUSCULAR | Status: DC | PRN
  Administered 2020-06-01: 5 mg via INTRAVENOUS

## 2020-06-01 MED ORDER — ACETAMINOPHEN 10 MG/ML IV SOLN
INTRAVENOUS | Status: AC
  Filled 2020-06-01: qty 100

## 2020-06-01 MED ORDER — VANCOMYCIN HCL IN DEXTROSE 1-5 GM/200ML-% IV SOLN
INTRAVENOUS | Status: AC
  Administered 2020-06-01: 1000 mg via INTRAVENOUS
  Filled 2020-06-01: qty 200

## 2020-06-01 MED ORDER — SODIUM CHLORIDE 0.9% FLUSH
3.0000 mL | Freq: Two times a day (BID) | INTRAVENOUS | Status: DC
  Administered 2020-06-01 (×2): 3 mL via INTRAVENOUS

## 2020-06-01 MED ORDER — OXYCODONE HCL 5 MG PO TABS
5.0000 mg | ORAL_TABLET | Freq: Once | ORAL | Status: DC | PRN
Start: 2020-06-01 — End: 2020-06-01

## 2020-06-01 MED ORDER — HYDROMORPHONE HCL 1 MG/ML IJ SOLN: 1.0000 mg | INTRAMUSCULAR | Status: DC | PRN

## 2020-06-01 MED ORDER — FENTANYL CITRATE (PF) 100 MCG/2ML IJ SOLN
INTRAMUSCULAR | Status: DC | PRN
  Administered 2020-06-01 (×2): 100 ug via INTRAVENOUS

## 2020-06-01 MED ORDER — ATORVASTATIN CALCIUM 40 MG PO TABS: 40.0000 mg | ORAL_TABLET | ORAL | Status: DC

## 2020-06-01 MED ORDER — INSULIN ASPART 100 UNIT/ML IJ SOLN
0.0000 [IU] | Freq: Three times a day (TID) | INTRAMUSCULAR | Status: DC
  Administered 2020-06-02: 0 [IU] via SUBCUTANEOUS

## 2020-06-01 MED ORDER — ALBUMIN HUMAN 5 % IV SOLN: INTRAVENOUS | Status: DC | PRN

## 2020-06-01 MED ORDER — CYCLOBENZAPRINE HCL 10 MG PO TABS: 10.0000 mg | ORAL_TABLET | Freq: Three times a day (TID) | ORAL | Status: DC | PRN

## 2020-06-01 MED ORDER — ROCURONIUM BROMIDE 10 MG/ML (PF) SYRINGE
PREFILLED_SYRINGE | INTRAVENOUS | Status: AC
  Filled 2020-06-01: qty 10

## 2020-06-01 MED ORDER — LIDOCAINE-EPINEPHRINE 1 %-1:100000 IJ SOLN
INTRAMUSCULAR | Status: AC
  Filled 2020-06-01: qty 1

## 2020-06-01 MED ORDER — ACETAMINOPHEN 10 MG/ML IV SOLN
1000.0000 mg | Freq: Once | INTRAVENOUS | Status: DC | PRN
  Administered 2020-06-01: 1000 mg via INTRAVENOUS

## 2020-06-01 MED ORDER — PROPOFOL 10 MG/ML IV BOLUS
INTRAVENOUS | Status: AC
  Filled 2020-06-01: qty 20

## 2020-06-01 MED ORDER — SUGAMMADEX SODIUM 500 MG/5ML IV SOLN
INTRAVENOUS | Status: AC
  Filled 2020-06-01: qty 5

## 2020-06-01 SURGICAL SUPPLY — 47 items
BAND RUBBER #18 3X1/16 STRL (MISCELLANEOUS) ×4 IMPLANT
BLADE CLIPPER SURG (BLADE) IMPLANT
BLADE SURG 11 STRL SS (BLADE) ×2 IMPLANT
BUR PRECISION FLUTE 5.0 (BURR) IMPLANT
BUR PRECISION MATCH 3.0 13 (BURR) ×2 IMPLANT
CANISTER SUCT 3000ML PPV (MISCELLANEOUS) ×2 IMPLANT
COVER WAND RF STERILE (DRAPES) ×2 IMPLANT
DECANTER SPIKE VIAL GLASS SM (MISCELLANEOUS) ×2 IMPLANT
DERMABOND ADVANCED (GAUZE/BANDAGES/DRESSINGS) ×1
DERMABOND ADVANCED .7 DNX12 (GAUZE/BANDAGES/DRESSINGS) ×1 IMPLANT
DRAPE C-ARM 42X72 X-RAY (DRAPES) ×4 IMPLANT
DRAPE LAPAROTOMY 100X72X124 (DRAPES) ×2 IMPLANT
DRAPE MICROSCOPE LEICA (MISCELLANEOUS) ×2 IMPLANT
DRAPE SURG 17X23 STRL (DRAPES) ×2 IMPLANT
DURAPREP 26ML APPLICATOR (WOUND CARE) ×2 IMPLANT
ELECT BLADE 6.5 EXT (BLADE) ×2 IMPLANT
ELECT REM PT RETURN 9FT ADLT (ELECTROSURGICAL) ×2
ELECTRODE REM PT RTRN 9FT ADLT (ELECTROSURGICAL) ×1 IMPLANT
GAUZE 4X4 16PLY RFD (DISPOSABLE) IMPLANT
GAUZE SPONGE 4X4 12PLY STRL (GAUZE/BANDAGES/DRESSINGS) IMPLANT
GLOVE BIOGEL PI IND STRL 7.5 (GLOVE) ×1 IMPLANT
GLOVE BIOGEL PI INDICATOR 7.5 (GLOVE) ×1
GLOVE ECLIPSE 7.5 STRL STRAW (GLOVE) ×2 IMPLANT
GLOVE EXAM NITRILE LRG STRL (GLOVE) IMPLANT
GLOVE EXAM NITRILE XL STR (GLOVE) IMPLANT
GLOVE EXAM NITRILE XS STR PU (GLOVE) IMPLANT
GOWN STRL REUS W/ TWL LRG LVL3 (GOWN DISPOSABLE) ×2 IMPLANT
GOWN STRL REUS W/ TWL XL LVL3 (GOWN DISPOSABLE) IMPLANT
GOWN STRL REUS W/TWL 2XL LVL3 (GOWN DISPOSABLE) IMPLANT
GOWN STRL REUS W/TWL LRG LVL3 (GOWN DISPOSABLE) ×4
GOWN STRL REUS W/TWL XL LVL3 (GOWN DISPOSABLE)
HEMOSTAT POWDER KIT SURGIFOAM (HEMOSTASIS) ×2 IMPLANT
KIT BASIN OR (CUSTOM PROCEDURE TRAY) ×2 IMPLANT
KIT TURNOVER KIT B (KITS) ×2 IMPLANT
NEEDLE HYPO 22GX1.5 SAFETY (NEEDLE) ×2 IMPLANT
NEEDLE SPNL 18GX3.5 QUINCKE PK (NEEDLE) ×2 IMPLANT
NS IRRIG 1000ML POUR BTL (IV SOLUTION) ×2 IMPLANT
PACK LAMINECTOMY NEURO (CUSTOM PROCEDURE TRAY) ×2 IMPLANT
PAD ARMBOARD 7.5X6 YLW CONV (MISCELLANEOUS) ×6 IMPLANT
SPONGE LAP 4X18 RFD (DISPOSABLE) IMPLANT
SUT MNCRL AB 3-0 PS2 18 (SUTURE) IMPLANT
SUT VIC AB 0 CT1 18XCR BRD8 (SUTURE) IMPLANT
SUT VIC AB 0 CT1 8-18 (SUTURE)
SUT VIC AB 2-0 CP2 18 (SUTURE) ×2 IMPLANT
TOWEL GREEN STERILE (TOWEL DISPOSABLE) ×2 IMPLANT
TOWEL GREEN STERILE FF (TOWEL DISPOSABLE) ×2 IMPLANT
WATER STERILE IRR 1000ML POUR (IV SOLUTION) ×2 IMPLANT

## 2020-06-01 NOTE — Progress Notes (Signed)
Pt's BP 205/68,pulse 83 on arrival to [facility]. Recheck of BP 194/74 and 214/102 . Dr. Maple Hudson in room when BP rechecked and aware of pt's BP. Pt reported to Dr. Maple Hudson that she drank 1/2cup of orange juice at 0330 for BS of 50. She stated that it was pulpless. BS on arrival to [facility] was 102.

## 2020-06-01 NOTE — Op Note (Signed)
PATIENT: Yolanda Saunders  DAY OF SURGERY: 06/01/20   PRE-OPERATIVE DIAGNOSIS:  Herniated nucleus pulposus with lumbar radiculopathy   POST-OPERATIVE DIAGNOSIS:  Herniated nucleus pulposus with lumbar radiculopathy   PROCEDURE:  Right minimally invasive L4-5 discectomy   SURGEON:  Surgeon(s) and Role:    Jadene Pierini, MD - Primary     Marikay Alar, MD - Assistant   ANESTHESIA: ETGA   BRIEF HISTORY: This is a 75 year old woman who presented with right lower extremity pain, numbness, and some weakness. An MRI showed a corresponding disc herniation and and nerve root compression at L4-5. I therefore recommended a minimally invasive L4-5 discectomy. This was discussed with the patient as well as risks, benefits, and alternatives and the patient wished to proceed with surgical treatment.    OPERATIVE DETAIL: The patient was taken to the operating room and placed on the OR table in the prone position. A formal time out was performed with two patient identifiers and confirmed the operative site. Anesthesia was induced by the anesthesia team. The operative site was marked, hair was clipped with surgical clippers, the area was then prepped and draped in a sterile fashion. Fluoroscopy was used to identify the surgical level prior to incision.   A 2cm incision was then marked 1cm off to the rightof midline. The fascia was incised sharply and serial dilators were docked to the lamino-facet junction using fluoroscopy to confirm position as well as perform a second count to confirm the correct surgical level. After a final dilator was placed, a tubular retractor was placed over this and secured to the table. The operating microscope was draped and brought into the field. Anatomy was palpated and confirmed, monopolar cautery was used to expose the facet, lamina, and a portion of the spinous process to confirm orientation. A high speed drill and kerrison rongeurs were then used to create a hemilaminotomy  and small medial facetectomy. The ligamentum flavum was resected and the thecal sac and traversing nerve root were identified. Dr. Yetta Barre scrubbed in and help with retracting the traversing nerve root to help protect it during the disc work. The nerve was gently retracted medially to expose the disc space using a suction-retractor. A disc herniation was clearly present. An annulotomy was created sharply and large, free disc fragments that were easily removed. The annulotomy was explored and additional free fragments were removed. The traversing nerve root was palpated throughout its visible course to confirm good decompression. The medial aspect of the neuro-foramen was also probed with a right angle ball-tip probe to confirm decompression of the exiting nerve root.   Hemostasis was obtained, the wound was copiously irrigated, and the tube was removed while using the microscope to confirm hemostasis of the muscle edges. All instrument and sponge counts were correct and the incision was then closed in layers. The patient was then returned to anesthesia for emergence. No apparent complications at the completion of the procedure.    EBL:  67mL   DRAINS: none   SPECIMENS: none   Jadene Pierini, MD 06/01/20 7:55 AM

## 2020-06-01 NOTE — Transfer of Care (Signed)
Immediate Anesthesia Transfer of Care Note  Patient: Yolanda Saunders  Procedure(s) Performed: Right Lumbar Four-Five Minimally invasive discectomy (Right )  Patient Location: PACU  Anesthesia Type:General  Level of Consciousness: drowsy and patient cooperative  Airway & Oxygen Therapy: Patient Spontanous Breathing  Post-op Assessment: Report given to RN and Patient moving all extremities X 4  Post vital signs: Reviewed and stable  Last Vitals:  Vitals Value Taken Time  BP 153/112 06/01/20 0936  Temp    Pulse 75 06/01/20 0936  Resp 13 06/01/20 0937  SpO2 94 % 06/01/20 0936  Vitals shown include unvalidated device data.  Last Pain:  Vitals:   06/01/20 0655  TempSrc:   PainSc: 8          Complications: No complications documented.

## 2020-06-01 NOTE — H&P (Signed)
Surgical H&P Update  HPI: 75 y.o. woman with RLE numbness / pain / weakness. No changes in health since she was last seen. Still having severe pain, numbness, and weakness and wishes to proceed with surgery.  PMHx:  Past Medical History:  Diagnosis Date  . Anemia 04/17/11   "long, long years ago"  . Angina 04/16/11   "that's what I'm here for"  . Anxiety   . Arthritis    Hips and knees  . Asthma   . Carpal tunnel syndrome   . Cataracts, bilateral   . CKD (chronic kidney disease), stage III (HCC)   . Complication of anesthesia 1987   "affected my eyes; couldn't see anything but blurrs even the next day"  . CVA (cerebral infarction)    After cardiac catheter 02/2000  . Depression   . Edema   . Fibromyalgia   . GERD (gastroesophageal reflux disease)   . Headache(784.0)   . High cholesterol   . History of bronchitis   . Hypertension   . IBS (irritable bowel syndrome)   . Migraines   . Panic attacks 04/17/11   "don't take anything for it"  . Pneumonia 04/17/11   "probably as many as 7 times"  . Renal disorder 04/17/11   "they are working at 60% capacity"  . Shortness of breath on exertion    "cause of my asthma"  . Stroke [facility]) 2002   residual "problem w/using the right word, left 5 lesions on my brain/MRI; long term memory loss"  . Type II diabetes mellitus (HCC)    FamHx:  Family History  Problem Relation Age of Onset  . Coronary artery disease Father   . Diabetes Mellitus I Father   . CVA Mother   . Stroke Mother   . Diabetes Mellitus I Mother   . Diabetes Mellitus I Sister   . Diabetes Mellitus I Brother   . Diabetes Mellitus I Maternal Grandmother   . Diabetes Mellitus I Maternal Grandfather   . Diabetes Mellitus I Paternal Grandmother   . Diabetes Mellitus I Paternal Grandfather   . Diabetes Mellitus I Brother   . Aortic aneurysm Son    SocHx:  reports that she quit smoking about 40 years ago. Her smoking use included cigarettes. She has a 4.50 pack-year  smoking history. She has never used smokeless tobacco. She reports that she does not drink alcohol and does not use drugs.  Physical Exam: AOx3, PERRL, FS, TM  Strength 5/5 x4 except 4/5 in R EHL/DF, 4+/5 PF, SILTx4 except L5 and S1 numbness  Assesment/Plan: 75 y.o. woman with lumbar radiculopathy with weakness, here for MIS discectomy. Risks, benefits, and alternatives discussed and the patient would like to continue with surgery.  -OR today -3C post-op  Jadene Pierini, MD 06/01/20 7:50 AM

## 2020-06-01 NOTE — Anesthesia Procedure Notes (Signed)
Procedure Name: Intubation Date/Time: 06/01/2020 8:09 AM Performed by: Barrington Ellison, CRNA Pre-anesthesia Checklist: Patient identified, Emergency Drugs available, Suction available and Patient being monitored Patient Re-evaluated:Patient Re-evaluated prior to induction Oxygen Delivery Method: Circle System Utilized Preoxygenation: Pre-oxygenation with 100% oxygen Induction Type: IV induction Ventilation: Mask ventilation without difficulty Laryngoscope Size: Mac and 3 Grade View: Grade II Tube type: Oral Tube size: 7.0 mm Number of attempts: 1 Airway Equipment and Method: Stylet and Oral airway Placement Confirmation: ETT inserted through vocal cords under direct vision,  positive ETCO2 and breath sounds checked- equal and bilateral Secured at: 21 cm Tube secured with: Tape Dental Injury: Teeth and Oropharynx as per pre-operative assessment

## 2020-06-01 NOTE — Evaluation (Signed)
Occupational Therapy Evaluation and Discharge Patient Details Name: Yolanda Saunders MRN: [id number] DOB: 05/25/1945 Today's Date: 06/01/2020    History of Present Illness Pt is a 74 yo female s/p right minimally invasive L4-5 discectomy.   Clinical Impression   This 75 yo female admitted and underwent above presents to acute OT with all education completed, we will D/C from acute OT.    Follow Up Recommendations  No OT follow up;Supervision - Intermittent    Equipment Recommendations  None recommended by OT       Precautions / Restrictions Precautions Precautions: Back Precaution Booklet Issued: Yes (comment) Precaution Comments: No brace required per orders Restrictions Weight Bearing Restrictions: No      Mobility Bed Mobility Overal bed mobility: Needs Assistance Bed Mobility: Rolling;Sidelying to Sit Rolling: Supervision Sidelying to sit: Supervision       General bed mobility comments: VCs for technique    Transfers Overall transfer level: Needs assistance Equipment used: Rolling walker (2 wheeled) Transfers: Sit to/from Stand Sit to Stand: Supervision         General transfer comment: VCs for safe hand placement    Balance Overall balance assessment: Mild deficits observed, not formally tested                                         ADL either performed or assessed with clinical judgement   ADL Overall ADL's : Needs assistance/impaired Eating/Feeding: Independent;Sitting     Grooming Details (indicate cue type and reason): Educated on use of two cups for brushing teeth to avoid bending over sink. Pt states as of late she had been sitting to brush her teeth Upper Body Bathing: Set up;Sitting   Lower Body Bathing: Minimal assistance Lower Body Bathing Details (indicate cue type and reason): S sit<>stand Upper Body Dressing : Set up;Sitting   Lower Body Dressing: Moderate assistance Lower Body Dressing Details (indicate cue  type and reason): S sit<>stand; pt reports her husband has been assisting her some with these tasks and continue to do so Toilet Transfer: Supervision/safety;Ambulation;RW     Toileting - Clothing Manipulation Details (indicate cue type and reason): Educated on use of wet wipes for back pericare             Vision Patient Visual Report: No change from baseline              Pertinent Vitals/Pain Pain Assessment: 0-10 Pain Score: 6  Pain Location: RLE Pain Descriptors / Indicators: Aching;Sore Pain Intervention(s): Limited activity within patient's tolerance;Monitored during session;Repositioned     Hand Dominance Right   Extremity/Trunk Assessment Upper Extremity Assessment Upper Extremity Assessment: Overall WFL for tasks assessed           Communication Communication Communication: No difficulties   Cognition Arousal/Alertness: Awake/alert Behavior During Therapy: WFL for tasks assessed/performed Overall Cognitive Status: Within Functional Limits for tasks assessed                                                Home Living Family/patient expects to be discharged to:: Private residence Living Arrangements: Spouse/significant other Available Help at Discharge: Family;Available 24 hours/day Type of Home: House Home Access: Stairs to enter [employer] of Steps: 6 Entrance Stairs-Rails: Right;Left;Can reach both Home Layout: One  level     Bathroom Shower/Tub: Producer, television/film/video: Standard     Home Equipment: Shower seat;Hand held Careers information officer - 4 wheels          Prior Functioning/Environment Level of Independence: Needs assistance  Gait / Transfers Assistance Needed: using rollator intermittently ADL's / Homemaking Assistance Needed: Husband has had to A her more over the last 5 months due to pain in RLE and back   Comments: Husband has been        OT Problem List: Impaired balance (sitting and/or  standing);Pain         OT Goals(Current goals can be found in the care plan section) Acute Rehab OT Goals Patient Stated Goal: to go home tomorrow and pain to be better                AM-PAC OT "6 Clicks" Daily Activity     Outcome Measure Help from another person eating meals?: None Help from another person taking care of personal grooming?: A Little Help from another person toileting, which includes using toliet, bedpan, or urinal?: A Little Help from another person bathing (including washing, rinsing, drying)?: A Little Help from another person to put on and taking off regular upper body clothing?: A Little Help from another person to put on and taking off regular lower body clothing?: A Lot 6 Click Score: 18   End of Session Equipment Utilized During Treatment: Rolling walker  Activity Tolerance: Patient tolerated treatment well Patient left: in chair;with call bell/phone within reach  OT Visit Diagnosis: Other abnormalities of gait and mobility (R26.89);Pain Pain - Right/Left: Right Pain - part of body: Leg                Time: 3785-8850 OT Time Calculation (min): 24 min Charges:  OT General Charges $OT Visit: 1 Visit OT Evaluation $OT Eval Moderate Complexity: 1 Mod OT Treatments $Self Care/Home Management : 8-22 mins  Ignacia Palma, OTR/L Acute [employer] Pager [phone] Office [phone]     Evette Georges 06/01/2020, 5:20 PM

## 2020-06-01 NOTE — Brief Op Note (Signed)
06/01/2020  9:17 AM  PATIENT:  Yolanda Saunders  75 y.o. female  PRE-OPERATIVE DIAGNOSIS:  Lumbar Radiculopathy  POST-OPERATIVE DIAGNOSIS:  Lumbar Radiculopathy  PROCEDURE:  Procedure(s): Right Lumbar Four-Five Minimally invasive discectomy (Right)  SURGEON:  Surgeon(s) and Role:    * Jadene Pierini, MD - Primary    * Tia Alert, MD - Assisting  PHYSICIAN ASSISTANT:   ANESTHESIA:   general  EBL:  10cc   BLOOD ADMINISTERED:none  DRAINS: none   LOCAL MEDICATIONS USED:  LIDOCAINE   SPECIMEN:  No Specimen  DISPOSITION OF SPECIMEN:  N/A  COUNTS:  YES  TOURNIQUET:  * No tourniquets in log *  DICTATION: .Note written in EPIC  PLAN OF CARE: Admit for overnight observation  PATIENT DISPOSITION:  PACU - hemodynamically stable.   Delay start of Pharmacological VTE agent (>24hrs) due to surgical blood loss or risk of bleeding: yes

## 2020-06-02 ENCOUNTER — Other Ambulatory Visit: Payer: Self-pay

## 2020-06-02 DIAGNOSIS — N183 Chronic kidney disease, stage 3 unspecified: Secondary | ICD-10-CM | POA: Diagnosis not present

## 2020-06-02 DIAGNOSIS — E119 Type 2 diabetes mellitus without complications: Secondary | ICD-10-CM | POA: Diagnosis not present

## 2020-06-02 DIAGNOSIS — J45909 Unspecified asthma, uncomplicated: Secondary | ICD-10-CM | POA: Diagnosis not present

## 2020-06-02 DIAGNOSIS — M5116 Intervertebral disc disorders with radiculopathy, lumbar region: Secondary | ICD-10-CM | POA: Diagnosis not present

## 2020-06-02 DIAGNOSIS — Z87891 Personal history of nicotine dependence: Secondary | ICD-10-CM | POA: Diagnosis not present

## 2020-06-02 DIAGNOSIS — I129 Hypertensive chronic kidney disease with stage 1 through stage 4 chronic kidney disease, or unspecified chronic kidney disease: Secondary | ICD-10-CM | POA: Diagnosis not present

## 2020-06-02 LAB — GLUCOSE, CAPILLARY
Glucose-Capillary: 166 mg/dL — ABNORMAL HIGH (ref 70–99)
Glucose-Capillary: 143 mg/dL — ABNORMAL HIGH (ref 70–99)

## 2020-06-02 NOTE — Discharge Instructions (Signed)
Wound Care Leave incision open to air. You may shower. Do not scrub directly on incision.  Do not put any creams, lotions, or ointments on incision. Activity Walk each and every day, increasing distance each day. No lifting greater than 5 lbs.  Avoid bending, arching, and twisting. No driving for 2 weeks; may ride as a passenger locally. Diet Resume your normal diet.  Return to Work Will be discussed at you follow up appointment. Call Your Doctor If Any of These Occur Redness, drainage, or swelling at the wound.  Temperature greater than 101 degrees. Severe pain not relieved by pain medication. Incision starts to come apart. Follow Up Appt Call today for appointment in 2-4 weeks ([phone]) or for problems.  If you have any hardware placed in your spine, you will need an x-ray before your appointment. 

## 2020-06-02 NOTE — Progress Notes (Signed)
Patient is discharged from room 3C08 at this time. Alert and in stable condition. IV site d/c'd and instructions read to patient with understanding verbalized and all questions answered. Left unit via wheelchair with all belongings at side. 

## 2020-06-02 NOTE — Evaluation (Signed)
Physical Therapy Evaluation/ Discharge Patient Details Name: Yolanda Saunders MRN: [id number] DOB: 06-17-1945 Today's Date: 06/02/2020   History of Present Illness  Pt is a 75 yo female s/p right minimally invasive L4-5 discectomy.  Clinical Impression  Pt very pleasant and reports slipping on ice in Dec causing initial injury and needing assist at home since that time due to pain. Pt with decreased gait and mobility with education for all precautions, safety, tranfers, gait and activity progression with pt able to verbalize understanding. Pt safe for D/C without further acute needs at this time.      Follow Up Recommendations No PT follow up    Equipment Recommendations  None recommended by PT    Recommendations for Other Services       Precautions / Restrictions Precautions Precautions: Back Precaution Booklet Issued: Yes (comment) Precaution Comments: No brace required per orders Restrictions Weight Bearing Restrictions: No      Mobility  Bed Mobility Overal bed mobility: Needs Assistance Bed Mobility: Rolling;Sidelying to Sit Rolling: Supervision Sidelying to sit: Supervision       General bed mobility comments: cues for precautions    Transfers Overall transfer level: Modified independent                  Ambulation/Gait Ambulation/Gait assistance: Supervision Gait Distance (Feet): 400 Feet Assistive device: Rolling walker (2 wheeled);None Gait Pattern/deviations: Step-through pattern;Decreased stride length;Antalgic   Gait velocity interpretation: >2.62 ft/sec, indicative of community ambulatory General Gait Details: pt walked initial 200' without RW with slow speed and cautious. Return 200' after stairs with increased antalgic gait and use of RW for support with cues for posture  Stairs Stairs: Yes Stairs assistance: Modified independent (Device/Increase time) Stair Management: Step to pattern;One rail Left;Forwards Number of Stairs: 6 General  stair comments: cues for sequence  Wheelchair Mobility    Modified Rankin (Stroke Patients Only)       Balance Overall balance assessment: Mild deficits observed, not formally tested                                           Pertinent Vitals/Pain Pain Score: 6  Pain Location: back Pain Descriptors / Indicators: Aching;Guarding;Sore Pain Intervention(s): Limited activity within patient's tolerance;Monitored during session;Repositioned;Patient requesting pain meds-RN notified    Home Living Family/patient expects to be discharged to:: Private residence Living Arrangements: Spouse/significant other Available Help at Discharge: Family;Available 24 hours/day Type of Home: House Home Access: Stairs to enter Entrance Stairs-Rails: Right;Left;Can reach both Entrance Stairs-Number of Steps: 6 Home Layout: One level Home Equipment: Shower seat;Hand held Careers information officer - 4 wheels;Walker - 2 wheels      Prior Function Level of Independence: Needs assistance   Gait / Transfers Assistance Needed: using rollator intermittently  ADL's / Homemaking Assistance Needed: Husband has had to A her more over the last 5 months due to pain in RLE and back        Hand Dominance        Extremity/Trunk Assessment   Upper Extremity Assessment Upper Extremity Assessment: Generalized weakness    Lower Extremity Assessment Lower Extremity Assessment: Generalized weakness    Cervical / Trunk Assessment Cervical / Trunk Assessment: Other exceptions Cervical / Trunk Exceptions: post surgical  Communication   Communication: No difficulties  Cognition Arousal/Alertness: Awake/alert Behavior During Therapy: WFL for tasks assessed/performed Overall Cognitive Status: Within Functional Limits for tasks assessed  General Comments      Exercises     Assessment/Plan    PT Assessment Patent does not need any  further PT services  PT Problem List         PT Treatment Interventions      PT Goals (Current goals can be found in the Care Plan section)  Acute Rehab PT Goals PT Goal Formulation: All assessment and education complete, DC therapy    Frequency     Barriers to discharge        Co-evaluation               AM-PAC PT "6 Clicks" Mobility  Outcome Measure Help needed turning from your back to your side while in a flat bed without using bedrails?: None Help needed moving from lying on your back to sitting on the side of a flat bed without using bedrails?: None Help needed moving to and from a bed to a chair (including a wheelchair)?: None Help needed standing up from a chair using your arms (e.g., wheelchair or bedside chair)?: None Help needed to walk in hospital room?: A Little Help needed climbing 3-5 steps with a railing? : None 6 Click Score: 23    End of Session   Activity Tolerance: Patient tolerated treatment well Patient left: in chair;with call bell/phone within reach Nurse Communication: Mobility status PT Visit Diagnosis: Other abnormalities of gait and mobility (R26.89)    Time: 1914-7829 PT Time Calculation (min) (ACUTE ONLY): 21 min   Charges:   PT Evaluation $PT Eval Low Complexity: 1 Low          Saina Waage P, PT Acute Rehabilitation Services Pager: [phone] Office: [phone]   Antar Milks B Reiana Poteet 06/02/2020, 9:01 AM

## 2020-06-02 NOTE — Discharge Summary (Signed)
Physician Discharge Summary  Patient ID: Yolanda Saunders MRN: [id number] DOB/AGE: Nov 25, 1945 75 y.o.  Admit date: 06/01/2020 Discharge date: 06/02/2020  Admission Diagnoses: HNP lumbar    Discharge Diagnoses: same   Discharged Condition: stable  Hospital Course: The patient was admitted on 06/01/2020 and taken to the operating room where the patient underwent lumbar microdiskectomy. The patient tolerated the procedure well and was taken to the recovery room and then to the floor in stable condition. The hospital course was routine. There were no complications. The wound remained clean dry and intact. Pt had appropriate back soreness. No complaints of leg pain or new N/T/W. The patient remained afebrile with stable vital signs, and tolerated a regular diet. The patient continued to increase activities, and pain was well controlled with oral pain medications.   Consults: None  Significant Diagnostic Studies:  Results for orders placed or performed during the hospital encounter of 06/01/20  Glucose, capillary  Result Value Ref Range   Glucose-Capillary 102 (H) 70 - 99 mg/dL  Glucose, capillary  Result Value Ref Range   Glucose-Capillary 105 (H) 70 - 99 mg/dL  Glucose, capillary  Result Value Ref Range   Glucose-Capillary 137 (H) 70 - 99 mg/dL   Comment 1 Notify RN    Comment 2 Document in Chart   Glucose, capillary  Result Value Ref Range   Glucose-Capillary 221 (H) 70 - 99 mg/dL  Glucose, capillary  Result Value Ref Range   Glucose-Capillary 246 (H) 70 - 99 mg/dL  Glucose, capillary  Result Value Ref Range   Glucose-Capillary 238 (H) 70 - 99 mg/dL  Glucose, capillary  Result Value Ref Range   Glucose-Capillary 215 (H) 70 - 99 mg/dL  Glucose, capillary  Result Value Ref Range   Glucose-Capillary 184 (H) 70 - 99 mg/dL   Comment 1 Notify RN    Comment 2 Document in Chart   Glucose, capillary  Result Value Ref Range   Glucose-Capillary 166 (H) 70 - 99 mg/dL  Glucose,  capillary  Result Value Ref Range   Glucose-Capillary 143 (H) 70 - 99 mg/dL    DG Lumbar Spine 2-3 Views  Result Date: 06/01/2020 CLINICAL DATA:  Surgery, elective. Additional history provided: Right lumbar 4-5 minimally invasive discectomy. Provided fluoroscopy time 22.4 seconds (13.22 mGy). EXAM: LUMBAR SPINE - 2-3 VIEW; DG C-ARM 1-60 MIN COMPARISON:  Lumbar spine MRI 03/24/2020. FINDINGS: AP and lateral view intraoperative fluoroscopic images of the lumbar spine are submitted. The lowest well-formed intervertebral disc space is designated L5-S1. On the AP view image, a surgical instrument is present with tip projecting near the superior aspect of the L4 spinous process. Correlate with procedural history. IMPRESSION: Two intraoperative fluoroscopic images of the lumbar spine, as described. Electronically Signed   By: Kellie Simmering DO   On: 06/01/2020 10:19   DG C-Arm 1-60 Min  Result Date: 06/01/2020 CLINICAL DATA:  Surgery, elective. Additional history provided: Right lumbar 4-5 minimally invasive discectomy. Provided fluoroscopy time 22.4 seconds (13.22 mGy). EXAM: LUMBAR SPINE - 2-3 VIEW; DG C-ARM 1-60 MIN COMPARISON:  Lumbar spine MRI 03/24/2020. FINDINGS: AP and lateral view intraoperative fluoroscopic images of the lumbar spine are submitted. The lowest well-formed intervertebral disc space is designated L5-S1. On the AP view image, a surgical instrument is present with tip projecting near the superior aspect of the L4 spinous process. Correlate with procedural history. IMPRESSION: Two intraoperative fluoroscopic images of the lumbar spine, as described. Electronically Signed   By: Kellie Simmering DO  On: 06/01/2020 10:19    Antibiotics:  Anti-infectives (From admission, onward)   Start     Dose/Rate Route Frequency Ordered Stop   06/01/20 0645  vancomycin (VANCOCIN) IVPB 1000 mg/200 mL premix        1,000 mg 200 mL/hr over 60 Minutes Intravenous On call to O.R. 06/01/20 0630 06/01/20 0817       Discharge Exam: Blood pressure (!) 145/72, pulse 79, temperature 98.2 F (36.8 C), temperature source Oral, resp. rate 20, height 5' 3.5" (1.613 m), weight 88.9 kg, SpO2 98 %. Neurologic: Grossly normal Dressing dry  Discharge Medications:   Allergies as of 06/02/2020      Reactions   Codeine Other (See Comments)   "makes me crazy; see things; delusional"   Erythromycin Other (See Comments)   "peeled skin; like a sunburn & I turn the color of the pill; a kind of purple-look"   Lisinopril Cough   "cause I have asthma"   Penicillins Rash, Other (See Comments)   "puffy blisters   Shellfish Allergy    Throat swells   Sulfonamide Derivatives Hives   "watery hives"   Metoprolol Diarrhea, Nausea And Vomiting   Rapid weight gain.   Actos [pioglitazone]    Upset GI   Amlodipine    Syncope   Benicar [olmesartan] Other (See Comments)   Dizziness and HA   Byetta 10 Mcg Pen [exenatide] Nausea And Vomiting   5 MCG pen   Gabapentin Other (See Comments)   Anxiety   Glimepiride    Upset GI   Glipizide    Upset GI   Glyburide-metformin    Myalgias   Humalog [insulin Lispro]    Headache, SEVERE HYPERGLYCEMIA   Invokana [canagliflozin]    Yeast infections   Januvia [sitagliptin]    uti   Other    Other reaction(s): Unknown   Reglan [metoclopramide]    unknown   Septra [sulfamethoxazole-trimethoprim] Hives   Spironolactone    unk   Tramadol Other (See Comments)   anxiety   Victoza [liraglutide] Diarrhea   Abdominal pain   Zocor [simvastatin]    unknown   Losartan Potassium Rash   Upset GI   Novolin R [insulin] Swelling, Rash   70/30      Medication List    TAKE these medications   aspirin EC 81 MG tablet Take 81 mg by mouth daily.   atorvastatin 40 MG tablet Commonly known as: LIPITOR Take 40 mg by mouth every Monday, Wednesday, and Friday.   cefdinir 300 MG capsule Commonly known as: OMNICEF Take 1 capsule (300 mg total) by mouth 2 (two) times daily.    furosemide 40 MG tablet Commonly known as: LASIX Take 40 mg by mouth daily.   hydrochlorothiazide 25 MG tablet Commonly known as: HYDRODIURIL Take 25 mg by mouth See admin instructions. Tid prn high blood pressure   insulin glargine 100 UNIT/ML injection Commonly known as: Lantus Inject 0.3 mLs (30 Units total) into the skin daily. What changed:   how much to take  additional instructions   insulin regular 100 units/mL injection Commonly known as: NOVOLIN R Inject 30-34 Units into the skin 3 (three) times daily before meals. Sliding scale   Insulin Syringe-Needle U-100 31G X 5/16" 0.5 ML Misc Commonly known as: RELION INSULIN SYR 0.5ML/31G USE 4x TIMES DAILY   OneTouch Verio test strip Generic drug: glucose blood USE 1 STRIP TO CHECK GLUCOSE 4x TIMES DAILY   OneTouch Verio w/Device Kit 1 each by Does not  apply route daily.   Syringe Luer Lock 23G X 1" 3 ML Misc 1 Package by Does not apply route as needed.   vitamin B-12 1000 MCG tablet Commonly known as: CYANOCOBALAMIN Take 1,000 mcg by mouth daily.   Vitamin D3 125 MCG (5000 UT) Tabs Take 5,000 Units by mouth daily.       Disposition: home   Final Dx: lumbar microdiskectomy  Discharge Instructions     Remove dressing in 72 hours   Complete by: As directed    Call MD for:  difficulty breathing, headache or visual disturbances   Complete by: As directed    Call MD for:  persistant nausea and vomiting   Complete by: As directed    Call MD for:  redness, tenderness, or signs of infection (pain, swelling, redness, odor or green/yellow discharge around incision site)   Complete by: As directed    Call MD for:  severe uncontrolled pain   Complete by: As directed    Call MD for:  temperature >100.4   Complete by: As directed    Diet - low sodium heart healthy   Complete by: As directed    Increase activity slowly   Complete by: As directed        Follow-up Information    Judith Part, MD.  Schedule an appointment as soon as possible for a visit in 2 week(s).   Specialty: Neurosurgery Contact information: [place] [id number] [phone]                Signed: Eustace Moore 06/02/2020, 6:45 AM

## 2020-06-04 ENCOUNTER — Encounter (HOSPITAL_COMMUNITY): Payer: Self-pay | Admitting: Neurological Surgery

## 2020-06-07 NOTE — Anesthesia Postprocedure Evaluation (Signed)
Anesthesia Post Note  Patient: SHILPA BUSHEE  Procedure(s) Performed: Right Lumbar Four-Five Minimally invasive discectomy (Right )     Patient location during evaluation: PACU Anesthesia Type: General Level of consciousness: awake and alert Pain management: pain level controlled Vital Signs Assessment: post-procedure vital signs reviewed and stable Respiratory status: spontaneous breathing, nonlabored ventilation, respiratory function stable and patient connected to nasal cannula oxygen Cardiovascular status: blood pressure returned to baseline and stable Postop Assessment: no apparent nausea or vomiting Anesthetic complications: no   No complications documented.  Last Vitals:  Vitals:   06/02/20 0330 06/02/20 0716  BP: (!) 145/72 (!) 143/70  Pulse: 79 78  Resp: 20 18  Temp: 36.8 C 37.1 C  SpO2: 98% 100%    Last Pain:  Vitals:   06/02/20 0910  TempSrc:   PainSc: 7                  Naliah Eddington

## 2020-07-20 ENCOUNTER — Other Ambulatory Visit: Payer: Self-pay | Admitting: Neurological Surgery

## 2020-07-20 ENCOUNTER — Other Ambulatory Visit (HOSPITAL_COMMUNITY): Payer: Self-pay | Admitting: Neurological Surgery

## 2020-07-20 DIAGNOSIS — M5416 Radiculopathy, lumbar region: Secondary | ICD-10-CM

## 2020-07-22 ENCOUNTER — Other Ambulatory Visit: Payer: Medicare Other

## 2020-07-25 ENCOUNTER — Other Ambulatory Visit: Payer: Medicare Other

## 2020-07-25 ENCOUNTER — Telehealth (HOSPITAL_COMMUNITY): Payer: Self-pay

## 2020-07-25 NOTE — Telephone Encounter (Signed)
I received an order from Geryl Councilman at Dr Marcy Siren office to schedule patient for right LE to R/O DVT, when I spoke with patient she refused test. I did call and speak with [facility] @ Dr Laretta Alstrom office, she told me to shred the referral and she would make the doctor aware.    [employer]

## 2020-07-27 ENCOUNTER — Other Ambulatory Visit: Payer: Medicare Other

## 2020-07-27 ENCOUNTER — Ambulatory Visit
Admission: RE | Admit: 2020-07-27 | Discharge: 2020-07-27 | Disposition: A | Discharge status: Home / Self Care | Payer: Medicare Other | Source: Ambulatory Visit | Attending: Neurological Surgery | Admitting: Neurological Surgery

## 2020-07-27 ENCOUNTER — Other Ambulatory Visit: Payer: Self-pay

## 2020-07-27 DIAGNOSIS — M5416 Radiculopathy, lumbar region: Secondary | ICD-10-CM

## 2020-07-27 DIAGNOSIS — M545 Low back pain, unspecified: Secondary | ICD-10-CM | POA: Diagnosis not present

## 2020-07-27 DIAGNOSIS — M48061 Spinal stenosis, lumbar region without neurogenic claudication: Secondary | ICD-10-CM | POA: Diagnosis not present

## 2020-08-02 ENCOUNTER — Observation Stay (HOSPITAL_COMMUNITY): Payer: Medicare Other

## 2020-08-02 ENCOUNTER — Other Ambulatory Visit: Payer: Self-pay | Admitting: Neurological Surgery

## 2020-08-02 ENCOUNTER — Inpatient Hospital Stay (HOSPITAL_COMMUNITY)
Admission: EM | Admit: 2020-08-02 | Discharge: 2020-08-08 | DRG: 064 | Disposition: A | Discharge status: Home Health | Payer: Medicare Other | Attending: Internal Medicine | Admitting: Internal Medicine

## 2020-08-02 ENCOUNTER — Emergency Department (HOSPITAL_COMMUNITY): Payer: Medicare Other

## 2020-08-02 DIAGNOSIS — E785 Hyperlipidemia, unspecified: Secondary | ICD-10-CM | POA: Diagnosis not present

## 2020-08-02 DIAGNOSIS — Q283 Other malformations of cerebral vessels: Secondary | ICD-10-CM | POA: Diagnosis not present

## 2020-08-02 DIAGNOSIS — I5043 Acute on chronic combined systolic (congestive) and diastolic (congestive) heart failure: Secondary | ICD-10-CM | POA: Diagnosis present

## 2020-08-02 DIAGNOSIS — Z7982 Long term (current) use of aspirin: Secondary | ICD-10-CM

## 2020-08-02 DIAGNOSIS — H538 Other visual disturbances: Secondary | ICD-10-CM | POA: Diagnosis not present

## 2020-08-02 DIAGNOSIS — Z823 Family history of stroke: Secondary | ICD-10-CM

## 2020-08-02 DIAGNOSIS — Z794 Long term (current) use of insulin: Secondary | ICD-10-CM

## 2020-08-02 DIAGNOSIS — I63531 Cerebral infarction due to unspecified occlusion or stenosis of right posterior cerebral artery: Secondary | ICD-10-CM | POA: Diagnosis not present

## 2020-08-02 DIAGNOSIS — Z6832 Body mass index (BMI) 32.0-32.9, adult: Secondary | ICD-10-CM

## 2020-08-02 DIAGNOSIS — N1831 Chronic kidney disease, stage 3a: Secondary | ICD-10-CM | POA: Diagnosis present

## 2020-08-02 DIAGNOSIS — Z79899 Other long term (current) drug therapy: Secondary | ICD-10-CM

## 2020-08-02 DIAGNOSIS — I5022 Chronic systolic (congestive) heart failure: Secondary | ICD-10-CM

## 2020-08-02 DIAGNOSIS — Z28310 Unvaccinated for covid-19: Secondary | ICD-10-CM

## 2020-08-02 DIAGNOSIS — I130 Hypertensive heart and chronic kidney disease with heart failure and stage 1 through stage 4 chronic kidney disease, or unspecified chronic kidney disease: Secondary | ICD-10-CM | POA: Diagnosis not present

## 2020-08-02 DIAGNOSIS — E1122 Type 2 diabetes mellitus with diabetic chronic kidney disease: Secondary | ICD-10-CM | POA: Diagnosis not present

## 2020-08-02 DIAGNOSIS — I2583 Coronary atherosclerosis due to lipid rich plaque: Secondary | ICD-10-CM | POA: Diagnosis not present

## 2020-08-02 DIAGNOSIS — G8929 Other chronic pain: Secondary | ICD-10-CM | POA: Diagnosis present

## 2020-08-02 DIAGNOSIS — E7800 Pure hypercholesterolemia, unspecified: Secondary | ICD-10-CM | POA: Diagnosis present

## 2020-08-02 DIAGNOSIS — J45909 Unspecified asthma, uncomplicated: Secondary | ICD-10-CM | POA: Diagnosis present

## 2020-08-02 DIAGNOSIS — E669 Obesity, unspecified: Secondary | ICD-10-CM | POA: Diagnosis present

## 2020-08-02 DIAGNOSIS — U071 COVID-19: Secondary | ICD-10-CM | POA: Diagnosis not present

## 2020-08-02 DIAGNOSIS — I429 Cardiomyopathy, unspecified: Secondary | ICD-10-CM | POA: Diagnosis present

## 2020-08-02 DIAGNOSIS — R29702 NIHSS score 2: Secondary | ICD-10-CM | POA: Diagnosis present

## 2020-08-02 DIAGNOSIS — I639 Cerebral infarction, unspecified: Secondary | ICD-10-CM | POA: Diagnosis not present

## 2020-08-02 DIAGNOSIS — I6389 Other cerebral infarction: Secondary | ICD-10-CM | POA: Diagnosis not present

## 2020-08-02 DIAGNOSIS — H53462 Homonymous bilateral field defects, left side: Secondary | ICD-10-CM | POA: Diagnosis present

## 2020-08-02 DIAGNOSIS — Z634 Disappearance and death of family member: Secondary | ICD-10-CM

## 2020-08-02 DIAGNOSIS — Z9049 Acquired absence of other specified parts of digestive tract: Secondary | ICD-10-CM

## 2020-08-02 DIAGNOSIS — R000 Tachycardia, unspecified: Secondary | ICD-10-CM | POA: Diagnosis not present

## 2020-08-02 DIAGNOSIS — I2510 Atherosclerotic heart disease of native coronary artery without angina pectoris: Secondary | ICD-10-CM | POA: Diagnosis present

## 2020-08-02 DIAGNOSIS — K219 Gastro-esophageal reflux disease without esophagitis: Secondary | ICD-10-CM | POA: Diagnosis present

## 2020-08-02 DIAGNOSIS — M797 Fibromyalgia: Secondary | ICD-10-CM | POA: Diagnosis present

## 2020-08-02 DIAGNOSIS — M21379 Foot drop, unspecified foot: Secondary | ICD-10-CM | POA: Diagnosis present

## 2020-08-02 DIAGNOSIS — M25551 Pain in right hip: Secondary | ICD-10-CM | POA: Diagnosis present

## 2020-08-02 DIAGNOSIS — I6622 Occlusion and stenosis of left posterior cerebral artery: Secondary | ICD-10-CM | POA: Diagnosis not present

## 2020-08-02 DIAGNOSIS — R410 Disorientation, unspecified: Secondary | ICD-10-CM | POA: Diagnosis not present

## 2020-08-02 DIAGNOSIS — I5042 Chronic combined systolic (congestive) and diastolic (congestive) heart failure: Secondary | ICD-10-CM

## 2020-08-02 DIAGNOSIS — I672 Cerebral atherosclerosis: Secondary | ICD-10-CM | POA: Diagnosis not present

## 2020-08-02 DIAGNOSIS — J90 Pleural effusion, not elsewhere classified: Secondary | ICD-10-CM | POA: Diagnosis not present

## 2020-08-02 DIAGNOSIS — Z880 Allergy status to penicillin: Secondary | ICD-10-CM

## 2020-08-02 DIAGNOSIS — I272 Pulmonary hypertension, unspecified: Secondary | ICD-10-CM | POA: Diagnosis present

## 2020-08-02 DIAGNOSIS — Z993 Dependence on wheelchair: Secondary | ICD-10-CM

## 2020-08-02 DIAGNOSIS — I63431 Cerebral infarction due to embolism of right posterior cerebral artery: Secondary | ICD-10-CM | POA: Diagnosis not present

## 2020-08-02 DIAGNOSIS — N183 Chronic kidney disease, stage 3 unspecified: Secondary | ICD-10-CM | POA: Diagnosis present

## 2020-08-02 DIAGNOSIS — Z888 Allergy status to other drugs, medicaments and biological substances status: Secondary | ICD-10-CM

## 2020-08-02 DIAGNOSIS — I351 Nonrheumatic aortic (valve) insufficiency: Secondary | ICD-10-CM | POA: Diagnosis not present

## 2020-08-02 DIAGNOSIS — Z885 Allergy status to narcotic agent status: Secondary | ICD-10-CM

## 2020-08-02 DIAGNOSIS — M5416 Radiculopathy, lumbar region: Secondary | ICD-10-CM | POA: Diagnosis not present

## 2020-08-02 DIAGNOSIS — Z833 Family history of diabetes mellitus: Secondary | ICD-10-CM

## 2020-08-02 DIAGNOSIS — E1165 Type 2 diabetes mellitus with hyperglycemia: Secondary | ICD-10-CM | POA: Diagnosis not present

## 2020-08-02 DIAGNOSIS — Z90710 Acquired absence of both cervix and uterus: Secondary | ICD-10-CM

## 2020-08-02 DIAGNOSIS — F419 Anxiety disorder, unspecified: Secondary | ICD-10-CM | POA: Diagnosis present

## 2020-08-02 DIAGNOSIS — E042 Nontoxic multinodular goiter: Secondary | ICD-10-CM | POA: Diagnosis not present

## 2020-08-02 DIAGNOSIS — Z8249 Family history of ischemic heart disease and other diseases of the circulatory system: Secondary | ICD-10-CM

## 2020-08-02 DIAGNOSIS — I5021 Acute systolic (congestive) heart failure: Secondary | ICD-10-CM | POA: Diagnosis not present

## 2020-08-02 DIAGNOSIS — R42 Dizziness and giddiness: Secondary | ICD-10-CM | POA: Diagnosis not present

## 2020-08-02 DIAGNOSIS — I6602 Occlusion and stenosis of left middle cerebral artery: Secondary | ICD-10-CM | POA: Diagnosis not present

## 2020-08-02 LAB — DIFFERENTIAL
Abs Immature Granulocytes: 0.02 10*3/uL (ref 0.00–0.07)
Basophils Absolute: 0 10*3/uL (ref 0.0–0.1)
Basophils Relative: 0 %
Eosinophils Absolute: 0.1 10*3/uL (ref 0.0–0.5)
Eosinophils Relative: 1 %
Immature Granulocytes: 0 %
Lymphocytes Relative: 16 %
Lymphs Abs: 1.1 10*3/uL (ref 0.7–4.0)
Monocytes Absolute: 0.6 10*3/uL (ref 0.1–1.0)
Monocytes Relative: 9 %
Neutro Abs: 5.2 10*3/uL (ref 1.7–7.7)
Neutrophils Relative %: 74 %

## 2020-08-02 LAB — COMPREHENSIVE METABOLIC PANEL
ALT: 16 U/L (ref 0–44)
AST: 15 U/L (ref 15–41)
Albumin: 3.6 g/dL (ref 3.5–5.0)
Alkaline Phosphatase: 57 U/L (ref 38–126)
Anion gap: 11 (ref 5–15)
BUN: 15 mg/dL (ref 8–23)
CO2: 23 mmol/L (ref 22–32)
Calcium: 9.6 mg/dL (ref 8.9–10.3)
Chloride: 102 mmol/L (ref 98–111)
Creatinine, Ser: 1.22 mg/dL — ABNORMAL HIGH (ref 0.44–1.00)
GFR, Estimated: 46 mL/min — ABNORMAL LOW (ref >60)
Glucose, Bld: 141 mg/dL — ABNORMAL HIGH (ref 70–99)
Potassium: 3.8 mmol/L (ref 3.5–5.1)
Sodium: 136 mmol/L (ref 135–145)
Total Bilirubin: 0.9 mg/dL (ref 0.3–1.2)
Total Protein: 6.5 g/dL (ref 6.5–8.1)

## 2020-08-02 LAB — I-STAT CHEM 8, ED
BUN: 18 mg/dL (ref 8–23)
Calcium, Ion: 1.17 mmol/L (ref 1.15–1.40)
Chloride: 104 mmol/L (ref 98–111)
Creatinine, Ser: 1.2 mg/dL — ABNORMAL HIGH (ref 0.44–1.00)
Glucose, Bld: 143 mg/dL — ABNORMAL HIGH (ref 70–99)
HCT: 43 % (ref 36.0–46.0)
Hemoglobin: 14.6 g/dL (ref 12.0–15.0)
Potassium: 3.8 mmol/L (ref 3.5–5.1)
Sodium: 138 mmol/L (ref 135–145)
TCO2: 25 mmol/L (ref 22–32)

## 2020-08-02 LAB — CBC
HCT: 43.9 % (ref 36.0–46.0)
Hemoglobin: 14.4 g/dL (ref 12.0–15.0)
MCH: 30.9 pg (ref 26.0–34.0)
MCHC: 32.8 g/dL (ref 30.0–36.0)
MCV: 94.2 fL (ref 80.0–100.0)
Platelets: 213 10*3/uL (ref 150–400)
RBC: 4.66 MIL/uL (ref 3.87–5.11)
RDW: 14 % (ref 11.5–15.5)
WBC: 7.1 10*3/uL (ref 4.0–10.5)
nRBC: 0 % (ref 0.0–0.2)

## 2020-08-02 LAB — APTT: aPTT: 28 seconds (ref 24–36)

## 2020-08-02 LAB — PROTIME-INR
INR: 1 (ref 0.8–1.2)
Prothrombin Time: 13.1 seconds (ref 11.4–15.2)

## 2020-08-02 LAB — CBG MONITORING, ED
Glucose-Capillary: 140 mg/dL — ABNORMAL HIGH (ref 70–99)
Glucose-Capillary: 102 mg/dL — ABNORMAL HIGH (ref 70–99)

## 2020-08-02 LAB — ETHANOL: Alcohol, Ethyl (B): <10 mg/dL (ref <10)

## 2020-08-02 MED ORDER — ACETAMINOPHEN 325 MG PO TABS
650.0000 mg | ORAL_TABLET | Freq: Once | ORAL | Status: AC
  Administered 2020-08-02: 650 mg via ORAL
  Filled 2020-08-02: qty 2

## 2020-08-02 MED ORDER — SENNOSIDES-DOCUSATE SODIUM 8.6-50 MG PO TABS: 1.0000 | ORAL_TABLET | Freq: Every evening | ORAL | Status: DC | PRN

## 2020-08-02 MED ORDER — LORAZEPAM 2 MG/ML IJ SOLN
0.5000 mg | Freq: Once | INTRAMUSCULAR | Status: AC
  Administered 2020-08-02: 0.5 mg via INTRAVENOUS
  Filled 2020-08-02: qty 1

## 2020-08-02 MED ORDER — STROKE: EARLY STAGES OF RECOVERY BOOK
Freq: Once | Status: AC
  Filled 2020-08-02: qty 1

## 2020-08-02 MED ORDER — ACETAMINOPHEN 160 MG/5ML PO SOLN: 650.0000 mg | ORAL | Status: DC | PRN

## 2020-08-02 MED ORDER — ENOXAPARIN SODIUM 40 MG/0.4ML IJ SOSY
40.0000 mg | PREFILLED_SYRINGE | INTRAMUSCULAR | Status: DC
  Administered 2020-08-02 – 2020-08-06 (×5): 40 mg via SUBCUTANEOUS
  Filled 2020-08-02 (×5): qty 0.4

## 2020-08-02 MED ORDER — IOHEXOL 350 MG/ML SOLN
75.0000 mL | Freq: Once | INTRAVENOUS | Status: AC | PRN
  Administered 2020-08-02: 75 mL via INTRAVENOUS

## 2020-08-02 MED ORDER — ACETAMINOPHEN 650 MG RE SUPP: 650.0000 mg | RECTAL | Status: DC | PRN

## 2020-08-02 MED ORDER — INSULIN ASPART 100 UNIT/ML IJ SOLN
0.0000 [IU] | Freq: Three times a day (TID) | INTRAMUSCULAR | Status: DC
Start: 2020-08-02 — End: 2020-08-05
  Administered 2020-08-03 (×2): 1 [IU] via SUBCUTANEOUS
  Administered 2020-08-03: 2 [IU] via SUBCUTANEOUS
  Administered 2020-08-03: 3 [IU] via SUBCUTANEOUS
  Administered 2020-08-04 (×2): 1 [IU] via SUBCUTANEOUS
  Administered 2020-08-04: 2 [IU] via SUBCUTANEOUS
  Administered 2020-08-04 (×2): 1 [IU] via SUBCUTANEOUS

## 2020-08-02 MED ORDER — ACETAMINOPHEN 325 MG PO TABS
650.0000 mg | ORAL_TABLET | ORAL | Status: DC | PRN
  Administered 2020-08-05 – 2020-08-06 (×3): 650 mg via ORAL
  Filled 2020-08-02 (×4): qty 2

## 2020-08-02 NOTE — H&P (Signed)
History and Physical    [name] XIV:[id number] DOB: Jul 13, 1945 DOA: 08/02/2020  PCP: Shirline Frees, MD  Patient coming from: Home, husband at bedside  I have personally briefly reviewed patient's old medical records in [place]  Chief Complaint: confusion  HPI: Yolanda Saunders is a 75 y.o. female with medical history significant for CVA, hypertension, IBS, insulin-dependent type 2 diabetes, CKD stage IIIa, asthma and hyperlipidemia who presents with concerns of increasing confusion.  About 2 days ago has been began to note that she was having confusion.  She was confused about how to use a remote and her phone.  Also her speech was not making much sense.  She also noticed some changes to her vision although denies double vision.  Denies any focal extremity weakness.  She is mostly wheelchair-bound due to right hip pain and recently underwent lumbar microdiscectomy on 5/13.  Her right hip pain recently returned and she has repeat surgery scheduled for next week. Patient has recently been undergoing a lot of stress.  Her son recently passed and his funeral is tomorrow afternoon.  ED Course: She was afebrile normotensive on room air.  CBC unremarkable.  Creatinine stable at baseline of 1.22.  BG of 141. CT head showed a large right PCA subacute cortical infarct.  Review of Systems: Constitutional: No Weight Change, No Fever ENT/Mouth: No sore throat, No Rhinorrhea Eyes: No Eye Pain, + Vision Changes Cardiovascular: No Chest Pain, no SOB, No Palpitations Respiratory: No Cough, No Sputum  Gastrointestinal: No Nausea, No Vomiting, No Diarrhea, No Constipation, No Pain Genitourinary: no Urinary Incontinence, No Urgency, No Flank Pain Musculoskeletal: No Arthralgias, No Myalgias Skin: No Skin Lesions, No Pruritus, Neuro: no Weakness, No Numbness Psych: No Anxiety/Panic, No Depression, no decrease appetite Heme/Lymph: No Bruising, No Bleeding   Past Medical History:   Diagnosis Date   Anemia 04/17/11   "long, long years ago"   Angina 04/16/11   "that's what I'm here for"   Anxiety    Arthritis    Hips and knees   Asthma    Carpal tunnel syndrome    Cataracts, bilateral    CKD (chronic kidney disease), stage III ([place])    Complication of anesthesia 1987   "affected my eyes; couldn't see anything but blurrs even the next day"   CVA (cerebral infarction)    After cardiac catheter 02/2000   Depression    Edema    Fibromyalgia    GERD (gastroesophageal reflux disease)    Headache(784.0)    High cholesterol    History of bronchitis    Hypertension    IBS (irritable bowel syndrome)    Migraines    Panic attacks 04/17/11   "don't take anything for it"   Pneumonia 04/17/11   "probably as many as 7 times"   Renal disorder 04/17/11   "they are working at 60% capacity"   Shortness of breath on exertion    "cause of my asthma"   Stroke ([place]) 2002   residual "problem w/using the right word, left 5 lesions on my brain/MRI; long term memory loss"   Type II diabetes mellitus ([place])     Past Surgical History:  Procedure Laterality Date   CARDIAC CATHETERIZATION  2002   CARPAL TUNNEL RELEASE  2003-2010   "twice on left; once on right"   [place] Bilateral 11/2019   Lenses Implant   histerectomy  LUMBAR LAMINECTOMY/ DECOMPRESSION WITH MET-RX Right 06/01/2020   Procedure: Right Lumbar Four-Five Minimally invasive discectomy;  Surgeon: Judith Part, MD;  Location: [facility];  Service: Neurosurgery;  Laterality: Right;   VAGINAL HYSTERECTOMY  1977     reports that she quit smoking about 40 years ago. Her smoking use included cigarettes. She has a 4.50 pack-year smoking history. She has never used smokeless tobacco. She reports that she does not drink alcohol and does not use drugs. Social History  Allergies  Allergen Reactions   Codeine Other (See Comments)    "makes me crazy; see  things; delusional"   Erythromycin Other (See Comments)    "peeled skin; like a sunburn & I turn the color of the pill; a kind of purple-look"   Lisinopril Cough    "cause I have asthma"   Penicillins Rash and Other (See Comments)    "puffy blisters   Shellfish Allergy     Throat swells   Sulfonamide Derivatives Hives    "watery hives"   Metoprolol Diarrhea and Nausea And Vomiting    Rapid weight gain.   Actos [Pioglitazone]     Upset GI   Amlodipine     Syncope   Benicar [Olmesartan] Other (See Comments)    Dizziness and HA   Byetta 10 Mcg Pen [Exenatide] Nausea And Vomiting    5 MCG pen   Gabapentin Other (See Comments)    Anxiety    Glimepiride     Upset GI   Glipizide     Upset GI   Glyburide-Metformin     Myalgias   Humalog [Insulin Lispro]     Headache, SEVERE HYPERGLYCEMIA   Invokana [Canagliflozin]     Yeast infections   Januvia [Sitagliptin]     uti   Other     Other reaction(s): Unknown   Reglan [Metoclopramide]     unknown   Septra [Sulfamethoxazole-Trimethoprim] Hives   Spironolactone     unk   Tramadol Other (See Comments)    anxiety   Victoza [Liraglutide] Diarrhea    Abdominal pain    Zocor [Simvastatin]     unknown   Losartan Potassium Rash    Upset GI   Novolin R [Insulin] Swelling and Rash    70/30    Family History  Problem Relation Age of Onset   Coronary artery disease Father    Diabetes Mellitus I Father    CVA Mother    Stroke Mother    Diabetes Mellitus I Mother    Diabetes Mellitus I Sister    Diabetes Mellitus I Brother    Diabetes Mellitus I Maternal Grandmother    Diabetes Mellitus I Maternal Grandfather    Diabetes Mellitus I Paternal Grandmother    Diabetes Mellitus I Paternal Grandfather    Diabetes Mellitus I Brother    Aortic aneurysm Son      Prior to Admission medications   Medication Sig Start Date End Date Taking? Authorizing Provider  aspirin EC 81 MG tablet Take 81 mg by mouth daily.    [provider]  atorvastatin (LIPITOR) 40 MG tablet Take 40 mg by mouth every Monday, Wednesday, and Friday. 01/17/16   [provider]  Blood Glucose Monitoring Suppl (ONETOUCH VERIO) w/Device KIT 1 each by Does not apply route daily. 11/14/15   Philemon Kingdom, MD  cefdinir (OMNICEF) 300 MG capsule Take 1 capsule (300 mg total) by mouth 2 (two) times daily. Patient not taking: Reported on 05/30/2020 03/15/20   Ron Parker,  Lenoria Chime, MD  Cholecalciferol (VITAMIN D3) 125 MCG (5000 UT) TABS Take 5,000 Units by mouth daily.    [provider]  furosemide (LASIX) 40 MG tablet Take 40 mg by mouth daily.    [provider]  glucose blood (ONETOUCH VERIO) test strip USE 1 STRIP TO CHECK GLUCOSE 4x TIMES DAILY 05/22/20   Philemon Kingdom, MD  hydrochlorothiazide (HYDRODIURIL) 25 MG tablet Take 25 mg by mouth See admin instructions. Tid prn high blood pressure    [provider]  insulin glargine (LANTUS) 100 UNIT/ML injection Inject 0.3 mLs (30 Units total) into the skin daily. Patient taking differently: Inject 30-34 Units into the skin daily. Sliding scale 05/22/20   Philemon Kingdom, MD  insulin regular (NOVOLIN R) 100 units/mL injection Inject 30-34 Units into the skin 3 (three) times daily before meals. Sliding scale    [provider]  Insulin Syringe-Needle U-100 (RELION INSULIN SYR 0.5ML/31G) 31G X 5/16" 0.5 ML MISC USE 4x TIMES DAILY 05/22/20   Philemon Kingdom, MD  Syringe/Needle, Disp, (SYRINGE LUER LOCK) 23G X 1" 3 ML MISC 1 Package by Does not apply route as needed. 07/21/14   Narda Amber K, DO  vitamin B-12 (CYANOCOBALAMIN) 1000 MCG tablet Take 1,000 mcg by mouth daily.    [provider]    Physical Exam: Vitals:   08/02/20 1607 08/02/20 1759 08/02/20 1800  BP: 137/72 (!) 146/61 (!) 149/76  Pulse: 99 86 85  Resp: _0 Temp: 98.6 F (37 C)    TempSrc: Oral    SpO2: 99% 99% 99%    Constitutional: NAD, calm, comfortable, elderly female  laying flat in bed Vitals:   08/02/20 1607 08/02/20 1759 08/02/20 1800  BP: 137/72 (!) 146/61 (!) 149/76  Pulse: 99 86 85  Resp: _1 Temp: 98.6 F (37 C)    TempSrc: Oral    SpO2: 99% 99% 99%   Eyes: PERRL, lids and conjunctivae normal ENMT: Mucous membranes are moist.  Neck: normal, supple Respiratory: clear to auscultation bilaterally, no wheezing, no crackles. Normal respiratory effort. No accessory muscle use.  Cardiovascular: Regular rate and rhythm, no murmurs / rubs / gallops. No extremity edema. 2+ pedal pulses. No carotid bruits.  Abdomen: no tenderness, no masses palpated.  Bowel sounds positive.  Musculoskeletal: no clubbing / cyanosis. No joint deformity upper and lower extremities. Good ROM, no contractures. Normal muscle tone.  Skin: no rashes, lesions, ulcers. No induration Neurologic: CN 2-12 grossly intact.  No nystagmus.  No facial asymmetry.  Sensation intact, Strength 5/5 in all 4.  Patient unable to name simple objects.  Current sentence recall Psychiatric: Normal judgment and insight. Alert and oriented x 3. Normal mood.     Labs on Admission: I have personally reviewed following labs and imaging studies  CBC: Recent Labs  Lab 08/02/20 1616 08/02/20 1654  WBC 7.1  --   NEUTROABS 5.2  --   HGB 14.4 14.6  HCT 43.9 43.0  MCV 94.2  --   PLT 213  --    Basic Metabolic Panel: Recent Labs  Lab 08/02/20 1616 08/02/20 1654  NA 136 138  K 3.8 3.8  CL 102 104  CO2 23  --   GLUCOSE 141* 143*  BUN 15 18  CREATININE 1.22* 1.20*  CALCIUM 9.6  --    GFR: CrCl cannot be calculated (Unknown ideal weight.). Liver Function Tests: Recent Labs  Lab 08/02/20 1616  AST 15  ALT 16  ALKPHOS  57  BILITOT 0.9  PROT 6.5  ALBUMIN 3.6   No results for input(s): LIPASE, AMYLASE in the last 168 hours. No results for input(s): AMMONIA in the last 168 hours. Coagulation Profile: Recent Labs  Lab 08/02/20 1616  INR 1.0   Cardiac Enzymes: No results for  input(s): CKTOTAL, CKMB, CKMBINDEX, TROPONINI in the last 168 hours. BNP (last 3 results) No results for input(s): PROBNP in the last 8760 hours. HbA1C: No results for input(s): HGBA1C in the last 72 hours. CBG: Recent Labs  Lab 08/02/20 1727  GLUCAP 140*   Lipid Profile: No results for input(s): CHOL, HDL, LDLCALC, TRIG, CHOLHDL, LDLDIRECT in the last 72 hours. Thyroid Function Tests: No results for input(s): TSH, T4TOTAL, FREET4, T3FREE, THYROIDAB in the last 72 hours. Anemia Panel: No results for input(s): VITAMINB12, FOLATE, FERRITIN, TIBC, IRON, RETICCTPCT in the last 72 hours. Urine analysis:    Component Value Date/Time   COLORURINE STRAW (A) 03/15/2020 1204   APPEARANCEUR CLEAR 03/15/2020 1204   LABSPEC 1.009 03/15/2020 1204   PHURINE 5.0 03/15/2020 1204   GLUCOSEU 150 (A) 03/15/2020 1204   HGBUR NEGATIVE 03/15/2020 1204   BILIRUBINUR NEGATIVE 03/15/2020 1204   KETONESUR NEGATIVE 03/15/2020 1204   PROTEINUR 30 (A) 03/15/2020 1204   UROBILINOGEN 1.0 10/18/2011 0811   NITRITE NEGATIVE 03/15/2020 1204   LEUKOCYTESUR NEGATIVE 03/15/2020 1204    Radiological Exams on Admission: CT HEAD WO CONTRAST  Result Date: 08/02/2020 CLINICAL DATA:  Mental status change, unknown cause. Additional history provided: Patient reports "not feeling like herself." Patient reports vision "isn't up to par." Patient reports dizziness and confusion. EXAM: CT HEAD WITHOUT CONTRAST TECHNIQUE: Contiguous axial images were obtained from the base of the skull through the vertex without intravenous contrast. COMPARISON:  Head CT 11/07/2016.  Brain MRI 07/18/2013. FINDINGS: Brain: Mild generalized cerebral and cerebellar atrophy. Abnormal cortical/subcortical hypodensity within the right temporal and occipital lobes measuring 8.9 x 2.6 x 1.4 cm (AP x TV x CC) compatible with acute right PCA territory infarction. Acute infarction changes are also present within the callosal splenium on the right. Chronic  lacunar infarct within the left lentiform nucleus not definitively present on the prior head CT of 11/07/2016. Background mild patchy and ill-defined hypoattenuation within the cerebral white matter, nonspecific but compatible chronic small vessel ischemic disease. Redemonstrated nonspecific linear calcifications within the inferior pons (series 3, image 26). There is no acute intracranial hemorrhage. No extra-axial fluid collection. No evidence of an intracranial mass. No midline shift. Vascular: No hyperdense vessel.  Atherosclerotic calcifications. Skull: Normal. Negative for fracture or focal lesion. Sinuses/Orbits: Visualized orbits show no acute finding. Mild bilateral frontal and ethmoid sinus mucosal thickening. These results were called by telephone at the time of interpretation on 08/02/2020 at 5:31 pm to provider PA [facility], who verbally acknowledged these results. IMPRESSION: Large acute cortical/subcortical right PCA territory infarct within the right temporal and occipital lobes, as well as callosal splenium. No significant mass effect at this time. No evidence of hemorrhagic conversion. Chronic lacunar infarct within the left basal ganglia, not definitively present on the prior head CT of 11/07/2016. Background mild generalized parenchymal atrophy and mild cerebral white matter chronic small vessel ischemic disease. Electronically Signed   By: Kellie Simmering DO   On: 08/02/2020 17:33      Assessment/Plan  Large right cortical PCA territory infarct  -pt already on daily aspirin for previous stoke. Neurology recommending adding Plavix for 3 months and then aspirin 11m alone -Neuro rec MRA  but unfortunately pt already went for CTA head and neck  - MRI brain pending - obtain echocardiogram  -Obtain A1c and lipids -PT/OT/SLT -Frequent neuro checks and keep on telemetry -Allow for permissive hypertension with blood pressure treatment as needed only if systolic goes above 536  COVID virus  infection -COVID test subsequently also turned positive  -Start remdesivir  Insulin-dependent type 2 diabetes Hemoglobin A1c of 9.1 on 03/2020 -place on sliding scale  Hypertension - Allow for permissive hypertension during stroke work-up  CKD stage IIIa - Creatinine stable  Hyperlipidemia - Continue statin  DVT prophylaxis:.Lovenox Code Status: Full Family Communication: Plan discussed with patient and husband at bedside  disposition Plan: Home with observation Consults called: Neurology Admission status: Observation  Level of care: Progressive  Status is: Observation  The patient remains OBS appropriate and will d/c before 2 midnights.  Dispo: The patient is from: Home              Anticipated d/c is to: Home              Patient currently is not medically stable to d/c.   Difficult to place patient No         Orene Desanctis DO [facility]   If 7PM-7AM, please contact night-coverage [email or web address]   08/02/2020, 6:50 PM

## 2020-08-02 NOTE — ED Notes (Signed)
Patient transported to CT/MRI 

## 2020-08-02 NOTE — ED Provider Notes (Signed)
Emergency Medicine Provider Triage Evaluation Note  Yolanda Saunders , a 75 y.o. female  was evaluated in triage.  Pt complains of confusion x2 days. Husband is at bedside and notes patient has been unable to perform simple tasks over the past 2 days such as tell the difference between a remote and a telephone. She notes she has been very confused. She recently lost her son on Tuesday, funeral is tomorrow, so has been under extra stress. She states her vision is different and everything is "bundled together". No speech changes, unilateral weakness, or dizziness. History of remote CVA. She recently had low back surgery on 5/13 by Dr. Maurice Small. They were evaluated in the office today where another surgery is scheduled for next week. No fever or chills. Denies chest pain and shortness of breath.   Review of Systems  Positive: confusion Negative: fever  Physical Exam  BP 137/72   Pulse 99   Temp 98.6 F (37 C) (Oral)   Resp 16   SpO2 99%  Gen:   Awake, no distress   Resp:  Normal effort  MSK:   Moves extremities without difficulty  Other:  No facial droop, equal grip strength, normal speech  Medical Decision Making  Medically screening exam initiated at 4:12 PM.  Appropriate orders placed.  Yolanda Saunders was informed that the remainder of the evaluation will be completed by another provider, this initial triage assessment does not replace that evaluation, and the importance of remaining in the ED until their evaluation is complete.  Stroke labs ordered. CT head. Patient out of TPA window.    Mannie Stabile, PA-C 08/02/20 1618    Rozelle Logan, DO 08/03/20 1213

## 2020-08-02 NOTE — ED Provider Notes (Signed)
[facility] EMERGENCY DEPARTMENT Provider Note   CSN: [id number] Arrival date & time: 08/02/20  1603     History Chief complaint confusion  Yolanda Saunders is a 75 y.o. female.  HPI  Patient's had difficulty with confusion over the last 2 days.  History was provided by the patient and her husband.  Husband noted the patient's had issues with performing simple tasks.  For example, she could not distinguish between her remote control and a telephone.  Patient feels like her vision is also off.  No known speech changes.  No dizziness.  No focal weakness.  Past Medical History:  Diagnosis Date  . Anemia 04/17/11   "long, long years ago"  . Angina 04/16/11   "that's what I'm here for"  . Anxiety   . Arthritis    Hips and knees  . Asthma   . Carpal tunnel syndrome   . Cataracts, bilateral   . CKD (chronic kidney disease), stage III ([place])   . Complication of anesthesia 1987   "affected my eyes; couldn't see anything but blurrs even the next day"  . CVA (cerebral infarction)    After cardiac catheter 02/2000  . Depression   . Edema   . Fibromyalgia   . GERD (gastroesophageal reflux disease)   . Headache(784.0)   . High cholesterol   . History of bronchitis   . Hypertension   . IBS (irritable bowel syndrome)   . Migraines   . Panic attacks 04/17/11   "don't take anything for it"  . Pneumonia 04/17/11   "probably as many as 7 times"  . Renal disorder 04/17/11   "they are working at 60% capacity"  . Shortness of breath on exertion    "cause of my asthma"  . Stroke [facility]) 2002   residual "problem w/using the right word, left 5 lesions on my brain/MRI; long term memory loss"  . Type II diabetes mellitus [facility])     Patient Active Problem List   Diagnosis Date Noted  . Lumbar radiculopathy 06/01/2020  . Plantar fasciitis 06/03/2017  . Syncope 10/15/2016  . CKD (chronic kidney disease), stage III ([place]) 10/15/2016  . Type 2 diabetes mellitus with stage 3  chronic kidney disease, with long-term current use of insulin ([place]) 08/07/2015  . Bilateral leg weakness 06/06/2014  . Abnormality of gait 06/06/2014  . Lumbosacral stenosis 06/06/2014  . Chest pain 04/16/2011  . Hyperlipidemia 09/13/2008  . Class 1 obesity 09/13/2008  . Essential hypertension 09/13/2008  . CVA 09/13/2008  . ASTHMA 09/13/2008  . IRRITABLE BOWEL SYNDROME 09/13/2008  . GALLSTONES 09/13/2008  . UTI 09/13/2008  . ARTHRITIS 09/13/2008  . FIBROMYALGIA 09/13/2008  . HEADACHE, CHRONIC 09/13/2008    Past Surgical History:  Procedure Laterality Date  . CARDIAC CATHETERIZATION  2002  . CARPAL TUNNEL RELEASE  2003-2010   "twice on left; once on right"  . CHOLECYSTECTOMY  1987  . DEBRIDEMENT TENNIS ELBOW  2010  . EYE SURGERY Bilateral 11/2019   Lenses Implant  . histerectomy    . LUMBAR LAMINECTOMY/ DECOMPRESSION WITH MET-RX Right 06/01/2020   Procedure: Right Lumbar Four-Five Minimally invasive discectomy;  Surgeon: Judith Part, MD;  Location: [place];  Service: Neurosurgery;  Laterality: Right;  Marland Kitchen VAGINAL HYSTERECTOMY  1977     OB History   No obstetric history on file.     Family History  Problem Relation Age of Onset  . Coronary artery disease Father   . Diabetes Mellitus I Father   .  CVA Mother   . Stroke Mother   . Diabetes Mellitus I Mother   . Diabetes Mellitus I Sister   . Diabetes Mellitus I Brother   . Diabetes Mellitus I Maternal Grandmother   . Diabetes Mellitus I Maternal Grandfather   . Diabetes Mellitus I Paternal Grandmother   . Diabetes Mellitus I Paternal Grandfather   . Diabetes Mellitus I Brother   . Aortic aneurysm Son     Social History   Tobacco Use  . Smoking status: Former    Packs/day: 0.75    Years: 6.00    Pack years: 4.50    Types: Cigarettes    Quit date: 01/21/1980    Years since quitting: 40.5  . Smokeless tobacco: Never  Substance Use Topics  . Alcohol use: No    Alcohol/week: 0.0 standard drinks  . Drug  use: No    Home Medications Prior to Admission medications   Medication Sig Start Date End Date Taking? Authorizing Provider  aspirin EC 81 MG tablet Take 81 mg by mouth daily.    [provider]  atorvastatin (LIPITOR) 40 MG tablet Take 40 mg by mouth every Monday, Wednesday, and Friday. 01/17/16   [provider]  Blood Glucose Monitoring Suppl (ONETOUCH VERIO) w/Device KIT 1 each by Does not apply route daily. 11/14/15   Philemon Kingdom, MD  cefdinir (OMNICEF) 300 MG capsule Take 1 capsule (300 mg total) by mouth 2 (two) times daily. Patient not taking: Reported on 05/30/2020 03/15/20   Breck Coons, MD  Cholecalciferol (VITAMIN D3) 125 MCG (5000 UT) TABS Take 5,000 Units by mouth daily.    [provider]  furosemide (LASIX) 40 MG tablet Take 40 mg by mouth daily.    [provider]  glucose blood (ONETOUCH VERIO) test strip USE 1 STRIP TO CHECK GLUCOSE 4x TIMES DAILY 05/22/20   Philemon Kingdom, MD  hydrochlorothiazide (HYDRODIURIL) 25 MG tablet Take 25 mg by mouth See admin instructions. Tid prn high blood pressure    [provider]  insulin glargine (LANTUS) 100 UNIT/ML injection Inject 0.3 mLs (30 Units total) into the skin daily. Patient taking differently: Inject 30-34 Units into the skin daily. Sliding scale 05/22/20   Philemon Kingdom, MD  insulin regular (NOVOLIN R) 100 units/mL injection Inject 30-34 Units into the skin 3 (three) times daily before meals. Sliding scale    [provider]  Insulin Syringe-Needle U-100 (RELION INSULIN SYR 0.5ML/31G) 31G X 5/16" 0.5 ML MISC USE 4x TIMES DAILY 05/22/20   Philemon Kingdom, MD  Syringe/Needle, Disp, (SYRINGE LUER LOCK) 23G X 1" 3 ML MISC 1 Package by Does not apply route as needed. 07/21/14   Narda Amber K, DO  vitamin B-12 (CYANOCOBALAMIN) 1000 MCG tablet Take 1,000 mcg by mouth daily.    [provider]    Allergies    Codeine, Erythromycin, Lisinopril, Penicillins,  Shellfish allergy, Sulfonamide derivatives, Metoprolol, Actos [pioglitazone], Amlodipine, Benicar [olmesartan], Byetta 10 mcg pen [exenatide], Gabapentin, Glimepiride, Glipizide, Glyburide-metformin, Humalog [insulin lispro], Invokana [canagliflozin], Januvia [sitagliptin], Other, Reglan [metoclopramide], Septra [sulfamethoxazole-trimethoprim], Spironolactone, Tramadol, Victoza [liraglutide], Zocor [simvastatin], Losartan potassium, and Novolin r [insulin]  Review of Systems   Review of Systems  Musculoskeletal:  Positive for arthralgias.       Chronic right hip, back pain  All other systems reviewed and are negative.  Physical Exam Updated Vital Signs BP (!) 149/76   Pulse 85   Temp 98.6 F (37 C) (Oral)   Resp 14   SpO2  99%   Physical Exam Vitals and nursing note reviewed.  Constitutional:      General: She is not in acute distress.    Appearance: She is well-developed.  HENT:     Head: Normocephalic and atraumatic.     Right Ear: External ear normal.     Left Ear: External ear normal.  Eyes:     General: No scleral icterus.       Right eye: No discharge.        Left eye: No discharge.     Conjunctiva/sclera: Conjunctivae normal.  Neck:     Trachea: No tracheal deviation.  Cardiovascular:     Rate and Rhythm: Normal rate and regular rhythm.  Pulmonary:     Effort: Pulmonary effort is normal. No respiratory distress.     Breath sounds: Normal breath sounds. No stridor. No wheezing or rales.  Abdominal:     General: Bowel sounds are normal. There is no distension.     Palpations: Abdomen is soft.     Tenderness: There is no abdominal tenderness. There is no guarding or rebound.  Musculoskeletal:        General: No tenderness or deformity.     Cervical back: Neck supple.  Skin:    General: Skin is warm and dry.     Findings: No rash.  Neurological:     General: No focal deficit present.     Mental Status: She is alert.     Cranial Nerves: No cranial nerve deficit (no  facial droop, extraocular movements intact, no slurred speech).     Sensory: No sensory deficit.     Motor: No abnormal muscle tone or seizure activity.     Coordination: Coordination abnormal.     Comments: Difficulty with recognizing objects, could not identify my cell phone or my watch  Psychiatric:        Mood and Affect: Mood normal.    ED Results / Procedures / Treatments   Labs (all labs ordered are listed, but only abnormal results are displayed) Labs Reviewed  COMPREHENSIVE METABOLIC PANEL - Abnormal; Notable for the following components:      Result Value   Glucose, Bld 141 (*)    Creatinine, Ser 1.22 (*)    GFR, Estimated 46 (*)    All other components within normal limits  I-STAT CHEM 8, ED - Abnormal; Notable for the following components:   Creatinine, Ser 1.20 (*)    Glucose, Bld 143 (*)    All other components within normal limits  CBG MONITORING, ED - Abnormal; Notable for the following components:   Glucose-Capillary 140 (*)    All other components within normal limits  ETHANOL  PROTIME-INR  APTT  CBC  DIFFERENTIAL  RAPID URINE DRUG SCREEN, HOSP PERFORMED  URINALYSIS, ROUTINE W REFLEX MICROSCOPIC    EKG EKG Interpretation  Date/Time:  Thursday August 02 2020 16:03:37 EDT Ventricular Rate:  101 PR Interval:  148 QRS Duration: 112 QT Interval:  366 QTC Calculation: 474 R Axis:   -27 Text Interpretation: Sinus tachycardia Moderate voltage criteria for LVH, may be normal variant ( R in aVL , Cornell product ) Septal infarct , age undetermined T wave abnormality, consider lateral ischemia Abnormal ECG Since last tracing rate faster Confirmed by Dorie Rank [id number]) on 08/02/2020 5:41:42 PM  Radiology CT HEAD WO CONTRAST  Result Date: 08/02/2020 CLINICAL DATA:  Mental status change, unknown cause. Additional history provided: Patient reports "not feeling like herself." Patient reports vision "isn't up  to par." Patient reports dizziness and confusion. EXAM: CT  HEAD WITHOUT CONTRAST TECHNIQUE: Contiguous axial images were obtained from the base of the skull through the vertex without intravenous contrast. COMPARISON:  Head CT 11/07/2016.  Brain MRI 07/18/2013. FINDINGS: Brain: Mild generalized cerebral and cerebellar atrophy. Abnormal cortical/subcortical hypodensity within the right temporal and occipital lobes measuring 8.9 x 2.6 x 1.4 cm (AP x TV x CC) compatible with acute right PCA territory infarction. Acute infarction changes are also present within the callosal splenium on the right. Chronic lacunar infarct within the left lentiform nucleus not definitively present on the prior head CT of 11/07/2016. Background mild patchy and ill-defined hypoattenuation within the cerebral white matter, nonspecific but compatible chronic small vessel ischemic disease. Redemonstrated nonspecific linear calcifications within the inferior pons (series 3, image 26). There is no acute intracranial hemorrhage. No extra-axial fluid collection. No evidence of an intracranial mass. No midline shift. Vascular: No hyperdense vessel.  Atherosclerotic calcifications. Skull: Normal. Negative for fracture or focal lesion. Sinuses/Orbits: Visualized orbits show no acute finding. Mild bilateral frontal and ethmoid sinus mucosal thickening. These results were called by telephone at the time of interpretation on 08/02/2020 at 5:31 pm to provider PA [facility], who verbally acknowledged these results. IMPRESSION: Large acute cortical/subcortical right PCA territory infarct within the right temporal and occipital lobes, as well as callosal splenium. No significant mass effect at this time. No evidence of hemorrhagic conversion. Chronic lacunar infarct within the left basal ganglia, not definitively present on the prior head CT of 11/07/2016. Background mild generalized parenchymal atrophy and mild cerebral white matter chronic small vessel ischemic disease. Electronically Signed   By: Kellie Simmering DO    On: 08/02/2020 17:33    Procedures .Critical Care  Date/Time: 08/02/2020 6:11 PM Performed by: Dorie Rank, MD Authorized by: Dorie Rank, MD   Critical care provider statement:    Critical care time (minutes):  30   Critical care was time spent personally by me on the following activities:  Discussions with consultants, evaluation of patient's response to treatment, examination of patient, ordering and performing treatments and interventions, ordering and review of laboratory studies, ordering and review of radiographic studies, pulse oximetry, re-evaluation of patient's condition, obtaining history from patient or surrogate and review of old charts   Medications Ordered in ED Medications  acetaminophen (TYLENOL) tablet 650 mg (has no administration in time range)    ED Course  I have reviewed the triage vital signs and the nursing notes.  Pertinent labs & imaging results that were available during my care of the patient were reviewed by me and considered in my medical decision making (see chart for details).  Clinical Course as of 08/02/20 1819  Thu Aug 02, 2020  1740 CT scan shows large right PCA subacute acute cortical infarct [JK]  1813 CBC unremarkable.  Metabolic panel unremarkable. [JK]  [place] [JK]    Clinical Course User Index [JK] Dorie Rank, MD   MDM Rules/Calculators/A&P                          Patient presents to the ED for evaluation of confusion and symptoms concerning for stroke.  Onset was 2 days ago.  Patient does have significant word finding difficulty.  She is having difficulty identifying objects.  Patient's laboratory tests are unremarkable.  Her CT scan does show an acute stroke.  Patient is outside any acute treatment window.  I will  consult with the medical service and the neurology service for admission and further treatment Final Clinical Impression(s) / ED Diagnoses Final diagnoses:  Acute right PCA stroke [facility] [address])     Dorie Rank,  MD 08/02/20 1814

## 2020-08-02 NOTE — ED Triage Notes (Signed)
Pt states she woke up yesterday morning "not feeling like herself." Denies weakness, states vision "isn't up to par" but hasn't worn reading glasses, no speech changes, confusion- "unable to tell TV remote from telephone" Denies CP, SHOB, thinners, falls Endorses dizziness, hx R hip surgery x2wks ago  Son passed Tuesday, funeral tomorrow

## 2020-08-02 NOTE — Consult Note (Signed)
NEUROLOGY CONSULTATION NOTE   Date of service: August 02, 2020 Patient Name: Yolanda Saunders MRN:  [id number] DOB:  [birth year] Reason for consult: "subacute R PCA stroke" Requesting Provider: Orene Desanctis, DO _ _ _   _ __   _ __ _ _  __ __   _ __   __ _  History of Present Illness  Yolanda Saunders is a 75 y.o. female with PMH significant for asthma, anxiety, GERD, HLD, migraine, HTN, Diabetes who presents with difficulty with distinguishing objects visually.  She was holding a remote and thought it was a telephone. This has been going on for 2 days she was seen by he back doctor and had trouble signing her name and could not recall why she was there. No prior similar symptoms. She was brought in to the ED for further evaluation and a CT Head without contrast revealed a large subacute right PCA stroke.  ROS   Constitutional Denies weight loss, fever and chills.   HEENT + changes in vision. No problems with hearing.   Respiratory Denies SOB and cough.   CV Denies palpitations and CP   GI Denies abdominal pain, nausea, vomiting and diarrhea.   GU Denies dysuria and urinary frequency.   MSK Denies myalgia and joint pain.   Skin Denies rash and pruritus.   Neurological Denies headache and syncope.   Psychiatric Denies recent changes in mood. Denies anxiety and depression.    Past History   Past Medical History:  Diagnosis Date   Anemia 04/17/11   "long, long years ago"   Angina 04/16/11   "that's what I'm here for"   Anxiety    Arthritis    Hips and knees   Asthma    Carpal tunnel syndrome    Cataracts, bilateral    CKD (chronic kidney disease), stage III ([place])    Complication of anesthesia 1987   "affected my eyes; couldn't see anything but blurrs even the next day"   CVA (cerebral infarction)    After cardiac catheter 02/2000   Depression    Edema    Fibromyalgia    GERD (gastroesophageal reflux disease)    Headache(784.0)    High cholesterol    History of bronchitis     Hypertension    IBS (irritable bowel syndrome)    Migraines    Panic attacks 04/17/11   "don't take anything for it"   Pneumonia 04/17/11   "probably as many as 7 times"   Renal disorder 04/17/11   "they are working at 60% capacity"   Shortness of breath on exertion    "cause of my asthma"   Stroke ([place]) 2002   residual "problem w/using the right word, left 5 lesions on my brain/MRI; long term memory loss"   Type II diabetes mellitus ([place])    Past Surgical History:  Procedure Laterality Date   CARDIAC CATHETERIZATION  2002   CARPAL TUNNEL RELEASE  2003-2010   "twice on left; once on right"   [place] Bilateral 11/2019   Lenses Implant   histerectomy     LUMBAR LAMINECTOMY/ DECOMPRESSION WITH MET-RX Right 06/01/2020   Procedure: Right Lumbar Four-Five Minimally invasive discectomy;  Surgeon: Judith Part, MD;  Location: [facility];  Service: Neurosurgery;  Laterality: Right;   VAGINAL HYSTERECTOMY  1977   Family History  Problem Relation Age of Onset   Coronary artery disease Father  Diabetes Mellitus I Father    CVA Mother    Stroke Mother    Diabetes Mellitus I Mother    Diabetes Mellitus I Sister    Diabetes Mellitus I Brother    Diabetes Mellitus I Maternal Grandmother    Diabetes Mellitus I Maternal Grandfather    Diabetes Mellitus I Paternal Grandmother    Diabetes Mellitus I Paternal Grandfather    Diabetes Mellitus I Brother    Aortic aneurysm Son    Social History   Socioeconomic History   Marital status: Married    Spouse name: Not on file   Number of children: Not on file   Years of education: Not on file   Highest education level: Not on file  Occupational History   Not on file  Tobacco Use   Smoking status: Former    Packs/day: 0.75    Years: 6.00    Pack years: 4.50    Types: Cigarettes    Quit date: 01/21/1980    Years since quitting: 40.5   Smokeless tobacco: Never  Substance and  Sexual Activity   Alcohol use: No    Alcohol/week: 0.0 standard drinks   Drug use: No   Sexual activity: Not Currently  Other Topics Concern   Not on file  Social History Narrative   Lives with husband in a one story home.  Has 2 children.  Retired from [employer].  Education: 12th grade.  Trade schools.    Social Determinants of Health   Financial Resource Strain: Not on file  Food Insecurity: Not on file  Transportation Needs: Not on file  Physical Activity: Not on file  Stress: Not on file  Social Connections: Not on file   Allergies  Allergen Reactions   Codeine Other (See Comments)    "makes me crazy; see things; delusional"   Erythromycin Other (See Comments)    "peeled skin; like a sunburn & I turn the color of the pill; a kind of purple-look"   Lisinopril Cough    "cause I have asthma"   Penicillins Rash and Other (See Comments)    "puffy blisters   Shellfish Allergy     Throat swells   Sulfonamide Derivatives Hives    "watery hives"   Metoprolol Diarrhea and Nausea And Vomiting    Rapid weight gain.   Actos [Pioglitazone]     Upset GI   Amlodipine     Syncope   Benicar [Olmesartan] Other (See Comments)    Dizziness and HA   Byetta 10 Mcg Pen [Exenatide] Nausea And Vomiting    5 MCG pen   Gabapentin Other (See Comments)    Anxiety    Glimepiride     Upset GI   Glipizide     Upset GI   Glyburide-Metformin     Myalgias   Humalog [Insulin Lispro]     Headache, SEVERE HYPERGLYCEMIA   Invokana [Canagliflozin]     Yeast infections   Januvia [Sitagliptin]     uti   Other     Other reaction(s): Unknown   Reglan [Metoclopramide]     unknown   Septra [Sulfamethoxazole-Trimethoprim] Hives   Spironolactone     unk   Tramadol Other (See Comments)    anxiety   Victoza [Liraglutide] Diarrhea    Abdominal pain    Zocor [Simvastatin]     unknown   Losartan Potassium Rash    Upset GI   Novolin R [Insulin] Swelling and Rash    70/30  Medications  (Not in a hospital admission)    Vitals   Vitals:   08/02/20 1607 08/02/20 1759 08/02/20 1800  BP: 137/72 (!) 146/61 (!) 149/76  Pulse: 99 86 85  Resp: _0 Temp: 98.6 F (37 C)    TempSrc: Oral    SpO2: 99% 99% 99%     There is no height or weight on file to calculate BMI.  Physical Exam   General: Laying comfortably in bed; in no acute distress.  HENT: Normal oropharynx and mucosa. Normal external appearance of ears and nose.  Neck: Supple, no pain or tenderness  CV: No JVD. No peripheral edema.  Pulmonary: Symmetric Chest rise. Normal respiratory effort.  Abdomen: Soft to touch, non-tender.  Ext: No cyanosis, edema, or deformity  Skin: No rash. Normal palpation of skin.   Musculoskeletal: Normal digits and nails by inspection. No clubbing.   Neurologic Examination  Mental status/Cognition: Alert, oriented to self, place, month and year, good attention.  Speech/language: Fluent, comprehension intact, object naming intact, repetition intact.  Cranial nerves:   CN II Pupils equal and reactive to light, Left hemianopsia   CN III,IV,VI EOM intact, no gaze preference or deviation, no nystagmus    CN V normal sensation in V1, V2, and V3 segments bilaterally    CN VII no asymmetry, no nasolabial fold flattening    CN VIII normal hearing to speech    CN IX & X normal palatal elevation, no uvular deviation    CN XI 5/5 head turn and 5/5 shoulder shrug bilaterally    CN XII midline tongue protrusion    Motor:  Muscle bulk: normal, tone normal, pronator drift none tremor none Mvmt Root Nerve  Muscle Right Left Comments  SA C5/6 Ax Deltoid 5 5   EF C5/6 Mc Biceps 5 5   EE C6/7/8 Rad Triceps 5 5   WF C6/7 Med FCR     WE C7/8 PIN ECU     F Ab C8/T1 U ADM/FDI 5 5   HF L1/2/3 Fem Illopsoas 5 5   KE L2/3/4 Fem Quad 5 5   DF L4/5 D Peron Tib Ant 5 5   PF S1/2 Tibial Grc/Sol 5 5    Reflexes:  Right Left Comments  Pectoralis      Biceps (C5/6) 0 0    Brachioradialis (C5/6) 1 1    Triceps (C6/7) 0 0    Patellar (L3/4) 0 0    Achilles (S1) 0 0    Hoffman      Plantar     Jaw jerk    Sensation:  Light touch intact   Pin prick    Temperature    Vibration   Proprioception    Coordination/Complex Motor:  - Finger to Nose intact BL - Heel to shin intact BL - Rapid alternating movement are normal - Gait: Unsafe to assess given vision deficit.  Labs   CBC:  Recent Labs  Lab 08/02/20 1616 08/02/20 1654  WBC 7.1  --   NEUTROABS 5.2  --   HGB 14.4 14.6  HCT 43.9 43.0  MCV 94.2  --   PLT 213  --     Basic Metabolic Panel:  Lab Results  Component Value Date   NA 138 08/02/2020   K 3.8 08/02/2020   CO2 23 08/02/2020   GLUCOSE 143 (H) 08/02/2020   BUN 18 08/02/2020   CREATININE 1.20 (H) 08/02/2020   CALCIUM 9.6 08/02/2020   GFRNONAA 46 (L)  08/02/2020   GFRAA 49 (L) 09/24/2018   Lipid Panel:  Lab Results  Component Value Date   LDLCALC 108 (H) 05/08/2015   HgbA1c:  Lab Results  Component Value Date   HGBA1C 8.0 (H) 05/30/2020   Urine Drug Screen: No results found for: LABOPIA, COCAINSCRNUR, LABBENZ, AMPHETMU, THCU, LABBARB  Alcohol Level     Component Value Date/Time   ETH <10 08/02/2020 1616    CT Head without contrast(personally reviewed): Large acute cortical/subcortical right PCA territory infarct within the right temporal and occipital lobes, as well as callosal splenium. No significant mass effect at this time. No evidence of hemorrhagic conversion.   Chronic lacunar infarct within the left basal ganglia, not definitively present on the prior head CT of 11/07/2016.   Background mild generalized parenchymal atrophy and mild cerebral white matter chronic small vessel ischemic disease.    MR Angio head without contrast and MR angio neck with and without contrast: pending  MRI Brain: pending  Impression   Yolanda Saunders is a 75 y.o. female with PMH significant for asthma, anxiety, GERD,  HLD, migraine, HTN, Diabetes who presents with difficulty identifying objects with her vision and left hemianopsia. She was found to have a R PCA stroke.  Primary Diagnosis:  Cerebral infarction due to embolism of  right posterior cerebral artery.    Secondary Diagnosis: Essential (primary) hypertension, Type 2 diabetes mellitus with hyperglycemia , and Obesity  Recommendations  Plan:  - Frequent Neuro checks per stroke unit protocol - Recommend brain imaging with MRI Brain without contrast - Recommend Vascular imaging with MRA Angio Head without contrast and MR Angio neck with and without contrast. - Recommend obtaining TTE - Recommend obtaining Lipid panel with LDL - Please start statin if LDL > 70 - Recommend HbA1c - Antithrombotic - aspirin 75m daily along with plavix 763mdaily for 3 weeks, followed by aspirin 8155maily alone. - Recommend DVT ppx - SBP goal - permissive hypertension first 24 h < 220/110. Held home meds.  - Recommend Telemetry monitoring for arrythmia - Recommend bedside swallow screen prior to PO intake. - Stroke education booklet - Recommend PT/OT/SLP consult  _____________________________________________________________________  Thank you for the opportunity to take part in the care of this patient. If you have any further questions, please contact the neurology consultation attending.  Signed,  [place] Falls[place] Number [phone]_ _   _ __   _ __ _ _  __ __   _ __   __ _

## 2020-08-03 ENCOUNTER — Observation Stay (HOSPITAL_COMMUNITY): Payer: Medicare Other

## 2020-08-03 ENCOUNTER — Encounter (HOSPITAL_COMMUNITY): Payer: Self-pay | Admitting: Family Medicine

## 2020-08-03 ENCOUNTER — Other Ambulatory Visit: Payer: Self-pay

## 2020-08-03 ENCOUNTER — Inpatient Hospital Stay (HOSPITAL_COMMUNITY): Payer: Medicare Other

## 2020-08-03 DIAGNOSIS — I5022 Chronic systolic (congestive) heart failure: Secondary | ICD-10-CM | POA: Diagnosis not present

## 2020-08-03 DIAGNOSIS — R29702 NIHSS score 2: Secondary | ICD-10-CM | POA: Diagnosis present

## 2020-08-03 DIAGNOSIS — G8929 Other chronic pain: Secondary | ICD-10-CM | POA: Diagnosis present

## 2020-08-03 DIAGNOSIS — H53462 Homonymous bilateral field defects, left side: Secondary | ICD-10-CM | POA: Diagnosis present

## 2020-08-03 DIAGNOSIS — M797 Fibromyalgia: Secondary | ICD-10-CM | POA: Diagnosis present

## 2020-08-03 DIAGNOSIS — I130 Hypertensive heart and chronic kidney disease with heart failure and stage 1 through stage 4 chronic kidney disease, or unspecified chronic kidney disease: Secondary | ICD-10-CM | POA: Diagnosis present

## 2020-08-03 DIAGNOSIS — I272 Pulmonary hypertension, unspecified: Secondary | ICD-10-CM | POA: Diagnosis present

## 2020-08-03 DIAGNOSIS — I6389 Other cerebral infarction: Secondary | ICD-10-CM

## 2020-08-03 DIAGNOSIS — I2510 Atherosclerotic heart disease of native coronary artery without angina pectoris: Secondary | ICD-10-CM | POA: Diagnosis present

## 2020-08-03 DIAGNOSIS — Z823 Family history of stroke: Secondary | ICD-10-CM | POA: Diagnosis not present

## 2020-08-03 DIAGNOSIS — I429 Cardiomyopathy, unspecified: Secondary | ICD-10-CM | POA: Diagnosis present

## 2020-08-03 DIAGNOSIS — I639 Cerebral infarction, unspecified: Secondary | ICD-10-CM | POA: Diagnosis present

## 2020-08-03 DIAGNOSIS — E1122 Type 2 diabetes mellitus with diabetic chronic kidney disease: Secondary | ICD-10-CM | POA: Diagnosis present

## 2020-08-03 DIAGNOSIS — E7800 Pure hypercholesterolemia, unspecified: Secondary | ICD-10-CM | POA: Diagnosis present

## 2020-08-03 DIAGNOSIS — I63431 Cerebral infarction due to embolism of right posterior cerebral artery: Secondary | ICD-10-CM | POA: Diagnosis present

## 2020-08-03 DIAGNOSIS — E1165 Type 2 diabetes mellitus with hyperglycemia: Secondary | ICD-10-CM | POA: Diagnosis present

## 2020-08-03 DIAGNOSIS — U071 COVID-19: Secondary | ICD-10-CM | POA: Diagnosis present

## 2020-08-03 DIAGNOSIS — I351 Nonrheumatic aortic (valve) insufficiency: Secondary | ICD-10-CM | POA: Diagnosis present

## 2020-08-03 DIAGNOSIS — Z634 Disappearance and death of family member: Secondary | ICD-10-CM | POA: Diagnosis not present

## 2020-08-03 DIAGNOSIS — Z6832 Body mass index (BMI) 32.0-32.9, adult: Secondary | ICD-10-CM | POA: Diagnosis not present

## 2020-08-03 DIAGNOSIS — N1831 Chronic kidney disease, stage 3a: Secondary | ICD-10-CM | POA: Diagnosis present

## 2020-08-03 DIAGNOSIS — E785 Hyperlipidemia, unspecified: Secondary | ICD-10-CM | POA: Diagnosis not present

## 2020-08-03 DIAGNOSIS — J45909 Unspecified asthma, uncomplicated: Secondary | ICD-10-CM | POA: Diagnosis present

## 2020-08-03 DIAGNOSIS — Z794 Long term (current) use of insulin: Secondary | ICD-10-CM | POA: Diagnosis not present

## 2020-08-03 DIAGNOSIS — E669 Obesity, unspecified: Secondary | ICD-10-CM | POA: Diagnosis present

## 2020-08-03 DIAGNOSIS — H538 Other visual disturbances: Secondary | ICD-10-CM | POA: Diagnosis present

## 2020-08-03 DIAGNOSIS — I5021 Acute systolic (congestive) heart failure: Secondary | ICD-10-CM | POA: Diagnosis not present

## 2020-08-03 DIAGNOSIS — I63531 Cerebral infarction due to unspecified occlusion or stenosis of right posterior cerebral artery: Secondary | ICD-10-CM | POA: Diagnosis not present

## 2020-08-03 DIAGNOSIS — I5043 Acute on chronic combined systolic (congestive) and diastolic (congestive) heart failure: Secondary | ICD-10-CM | POA: Diagnosis present

## 2020-08-03 DIAGNOSIS — I2583 Coronary atherosclerosis due to lipid rich plaque: Secondary | ICD-10-CM | POA: Diagnosis not present

## 2020-08-03 DIAGNOSIS — Z8249 Family history of ischemic heart disease and other diseases of the circulatory system: Secondary | ICD-10-CM | POA: Diagnosis not present

## 2020-08-03 LAB — HEMOGLOBIN A1C
Hgb A1c MFr Bld: 7.2 % — ABNORMAL HIGH (ref 4.8–5.6)
Mean Plasma Glucose: 159.94 mg/dL

## 2020-08-03 LAB — GLUCOSE, CAPILLARY
Glucose-Capillary: 114 mg/dL — ABNORMAL HIGH (ref 70–99)
Glucose-Capillary: 130 mg/dL — ABNORMAL HIGH (ref 70–99)
Glucose-Capillary: 210 mg/dL — ABNORMAL HIGH (ref 70–99)
Glucose-Capillary: 169 mg/dL — ABNORMAL HIGH (ref 70–99)
Glucose-Capillary: 144 mg/dL — ABNORMAL HIGH (ref 70–99)

## 2020-08-03 LAB — ECHOCARDIOGRAM COMPLETE
AR max vel: 1.74 cm2
AV Area VTI: 1.81 cm2
AV Area mean vel: 1.51 cm2
AV Mean grad: 5 mmHg
AV Peak grad: 9.2 mmHg
Ao pk vel: 1.52 m/s
Area-P 1/2: 4.49 cm2
Height: 63 in
S' Lateral: 3.7 cm
Weight: 2931.24 oz

## 2020-08-03 LAB — LIPID PANEL
Cholesterol: 278 mg/dL — ABNORMAL HIGH (ref 0–200)
HDL: 30 mg/dL — ABNORMAL LOW (ref >40)
LDL Cholesterol: 221 mg/dL — ABNORMAL HIGH (ref 0–99)
Total CHOL/HDL Ratio: 9.3 RATIO
Triglycerides: 137 mg/dL (ref <150)
VLDL: 27 mg/dL (ref 0–40)

## 2020-08-03 LAB — MRSA NEXT GEN BY PCR, NASAL: MRSA by PCR Next Gen: NOT DETECTED

## 2020-08-03 LAB — SARS CORONAVIRUS 2 (TAT 6-24 HRS): SARS Coronavirus 2: POSITIVE — AB

## 2020-08-03 MED ORDER — ASPIRIN EC 81 MG PO TBEC
81.0000 mg | DELAYED_RELEASE_TABLET | Freq: Every day | ORAL | Status: DC
  Administered 2020-08-03 – 2020-08-08 (×6): 81 mg via ORAL
  Filled 2020-08-03 (×6): qty 1

## 2020-08-03 MED ORDER — SODIUM CHLORIDE 0.9 % IV SOLN
200.0000 mg | Freq: Once | INTRAVENOUS | Status: AC
  Administered 2020-08-03: 200 mg via INTRAVENOUS
  Filled 2020-08-03: qty 40

## 2020-08-03 MED ORDER — SODIUM CHLORIDE 0.9 % IV SOLN
100.0000 mg | Freq: Every day | INTRAVENOUS | Status: AC
  Administered 2020-08-04 – 2020-08-06 (×3): 100 mg via INTRAVENOUS
  Filled 2020-08-03 (×4): qty 20

## 2020-08-03 MED ORDER — ATORVASTATIN CALCIUM 80 MG PO TABS
80.0000 mg | ORAL_TABLET | Freq: Every day | ORAL | Status: DC
  Administered 2020-08-03 – 2020-08-08 (×6): 80 mg via ORAL
  Filled 2020-08-03 (×6): qty 1

## 2020-08-03 MED ORDER — CLOPIDOGREL BISULFATE 75 MG PO TABS
75.0000 mg | ORAL_TABLET | Freq: Every day | ORAL | Status: DC
  Administered 2020-08-03 – 2020-08-08 (×6): 75 mg via ORAL
  Filled 2020-08-03 (×6): qty 1

## 2020-08-03 MED ORDER — PERFLUTREN LIPID MICROSPHERE
1.0000 mL | INTRAVENOUS | Status: AC | PRN
  Administered 2020-08-03: 5 mL via INTRAVENOUS
  Filled 2020-08-03: qty 10

## 2020-08-03 NOTE — Evaluation (Signed)
Physical Therapy Evaluation Patient Details Name: Yolanda Saunders MRN: [id number] DOB: September 12, 1945 Today's Date: 08/03/2020   History of Present Illness  75yo female admitted 08/02/20 with increased confusion, speech changes, and visual changes. Imaging shows large R PCA CVA. Incidentally Covid +. PMH CKD, CVA, fibromyalgia, HTN, IBS, panic attacks, CVA, DM, lumbar surgery 5/22  Clinical Impression   Patient received in bed, pleasant but very flat affect; spouse present and observed entirety of session. They report she has actually been mostly bed bound recently due to R Hip pain. However- able to mobilize on a min guard basis with heavy VC for safety/sequencing and tolerated in room gait distances well today. VSS on RA. Grossly but not unilaterally weak, LTT intact, no significant RAM deviations noted. Does definitely have L visual impairment (possible hemianopsia?) and significant difficulty sequencing tasks. Refused up to chair due to R hip discomfort in sitting, left in bed with all needs met in chair position, bed alarm active and spouse present. Will benefit from skilled HHPT f/u at DC.     Follow Up Recommendations Home health PT;Supervision/Assistance - 24 hour    Equipment Recommendations  Rolling walker with 5" wheels;3in1 (PT)    Recommendations for Other Services       Precautions / Restrictions Precautions Precautions: Back;Fall;Other (comment) Precaution Booklet Issued: No Precaution Comments: verbally reviewed back precautions (recent surgery May 2022), Covid +, severe R hip pain Restrictions Weight Bearing Restrictions: No      Mobility  Bed Mobility Overal bed mobility: Needs Assistance Bed Mobility: Rolling;Sidelying to Sit;Sit to Sidelying Rolling: Modified independent (Device/Increase time) Sidelying to sit: Min guard     Sit to sidelying: Min guard General bed mobility comments: able to roll side to side without difficulty with use of rail, did need min  guard for getting to/from EOB to maintain back precautions    Transfers Overall transfer level: Needs assistance Equipment used: Rolling walker (2 wheeled) Transfers: Sit to/from Stand Sit to Stand: Min guard         General transfer comment: min guard for safety, repeated max VC for hand placement and sequencing  Ambulation/Gait Ambulation/Gait assistance: Min guard Gait Distance (Feet): 20 Feet Assistive device: Rolling walker (2 wheeled) Gait Pattern/deviations: Step-through pattern;Antalgic;Decreased weight shift to right;Decreased stance time - right;Trunk flexed;Drifts right/left Gait velocity: decreased   General Gait Details: slow but steady with RW, gait distance limited at baseline due to severe R hip pain  Stairs            Wheelchair Mobility    Modified Rankin (Stroke Patients Only)       Balance Overall balance assessment: Mild deficits observed, not formally tested                                           Pertinent Vitals/Pain Pain Assessment: Faces Faces Pain Scale: Hurts little more Pain Location: R hip pain in weight bearing Pain Descriptors / Indicators: Aching;Discomfort Pain Intervention(s): Limited activity within patient's tolerance;Monitored during session    Home Living Family/patient expects to be discharged to:: Private residence Living Arrangements: Spouse/significant other Available Help at Discharge: Family;Available 24 hours/day Type of Home: House Home Access: Stairs to enter Entrance Stairs-Rails: Right;Left;Can reach both Entrance Stairs-Number of Steps: 6 Home Layout: One level Home Equipment: Shower seat;Hand held Tourist information centre manager - 4 wheels;Walker - 2 wheels Additional Comments: was scheduled for back surgery  next week, now likely cancelled due to covid +    Prior Function Level of Independence: Needs assistance               Hand Dominance   Dominant Hand: Right    Extremity/Trunk  Assessment   Upper Extremity Assessment Upper Extremity Assessment: Defer to OT evaluation    Lower Extremity Assessment Lower Extremity Assessment: Generalized weakness    Cervical / Trunk Assessment Cervical / Trunk Assessment: Kyphotic  Communication   Communication: No difficulties  Cognition Arousal/Alertness: Awake/alert Behavior During Therapy: Flat affect Overall Cognitive Status: Impaired/Different from baseline Area of Impairment: Safety/judgement;Awareness;Problem solving                         Safety/Judgement: Decreased awareness of safety;Decreased awareness of deficits Awareness: Intellectual Problem Solving: Slow processing;Decreased initiation;Difficulty sequencing;Requires tactile cues;Requires verbal cues General Comments: requires repeated verbal cues and tactile cues for correct performance of all tasks; also seems to have possible STM deficits and slighlty impulsive      General Comments General comments (skin integrity, edema, etc.): VSS on RA    Exercises     Assessment/Plan    PT Assessment Patient needs continued PT services  PT Problem List Decreased strength;Decreased cognition;Decreased knowledge of use of DME;Decreased activity tolerance;Decreased safety awareness;Decreased balance;Decreased mobility;Decreased coordination       PT Treatment Interventions DME instruction;Balance training;Gait training;Stair training;Functional mobility training;Patient/family education;Cognitive remediation;Therapeutic activities;Therapeutic exercise;Wheelchair mobility training    PT Goals (Current goals can be found in the Care Plan section)  Acute Rehab PT Goals Patient Stated Goal: go home, less hip pain PT Goal Formulation: With patient/family Time For Goal Achievement: 08/17/20 Potential to Achieve Goals: Fair    Frequency Min 3X/week   Barriers to discharge        Co-evaluation               AM-PAC PT "6 Clicks" Mobility   Outcome Measure Help needed turning from your back to your side while in a flat bed without using bedrails?: None Help needed moving from lying on your back to sitting on the side of a flat bed without using bedrails?: A Little Help needed moving to and from a bed to a chair (including a wheelchair)?: A Little Help needed standing up from a chair using your arms (e.g., wheelchair or bedside chair)?: A Little Help needed to walk in hospital room?: A Little Help needed climbing 3-5 steps with a railing? : A Lot 6 Click Score: 18    End of Session   Activity Tolerance: Patient tolerated treatment well Patient left: in bed;with call bell/phone within reach;with bed alarm set;with family/visitor present (in chair position to 40 degrees) Nurse Communication: Mobility status PT Visit Diagnosis: Unsteadiness on feet (R26.81);Muscle weakness (generalized) (M62.81);Pain;Difficulty in walking, not elsewhere classified (R26.2) Pain - Right/Left: Right Pain - part of body: Hip    Time: 6770-3403 PT Time Calculation (min) (ACUTE ONLY): 28 min   Charges:   PT Evaluation $PT Eval Moderate Complexity: 1 Mod PT Treatments $Gait Training: 8-22 mins       Windell Norfolk, DPT, PN1   Supplemental Physical Therapist [place]    Pager [phone] Acute Rehab Office [phone]

## 2020-08-03 NOTE — Plan of Care (Signed)
  Problem: Clinical Measurements: Goal: Ability to maintain clinical measurements within normal limits will improve Outcome: Progressing   Problem: Health Behavior/Discharge Planning: Goal: Ability to manage health-related needs will improve Outcome: Progressing   

## 2020-08-03 NOTE — Progress Notes (Signed)
Notified by RN that Covid test just resulted as positive. H&P not completed yet by admitting physician. No previous positive Covid test in chart.  Covid may be inciting etiology of the CVA.  Start Remdisivir, pharmacy consulted. Pt placed in airborne precautions.

## 2020-08-03 NOTE — Progress Notes (Signed)
Dr. Rachael Darby notified of patient having a postive covid test.

## 2020-08-03 NOTE — ED Notes (Signed)
Report called to RN receiving pt on [id number].

## 2020-08-03 NOTE — Progress Notes (Signed)
   08/03/20 0400  Assess: MEWS Score  Temp 97.7 F (36.5 C)  BP 97/82  Pulse Rate 78  ECG Heart Rate 78  Resp 13  Level of Consciousness Alert  SpO2 92 %  Assess: MEWS Score  MEWS Temp 0  MEWS Systolic 1  MEWS Pulse 0  MEWS RR 1  MEWS LOC 0  MEWS Score 2  MEWS Score Color Yellow  Assess: if the MEWS score is Yellow or Red  Were vital signs taken at a resting state? Yes  Focused Assessment No change from prior assessment  Early Detection of Sepsis Score *See Row Information* Low  MEWS guidelines implemented *See Row Information* No, vital signs rechecked  Treat  MEWS Interventions Administered scheduled meds/treatments

## 2020-08-03 NOTE — Progress Notes (Signed)
STROKE TEAM PROGRESS NOTE   SUBJECTIVE (INTERVAL HISTORY) No family is at the bedside.  Patient lying in bed, moving all extremities, however still has left hemianopia, not fully orientated with anomia.  MRI showed right PCA large infarct.  Plan to have loop recorder placed, however discussed with the EP, due to COVID positive status, will do loop recorder as outpatient.     OBJECTIVE Temp:  [97.7 F (36.5 C)-98.5 F (36.9 C)] 98.5 F (36.9 C) (07/15 1651) Pulse Rate:  [71-87] 87 (07/15 1651) Cardiac Rhythm: Normal sinus rhythm (07/15 0900) Resp:  [13-21] 18 (07/15 1651) BP: (93-152)/(54-92) 134/68 (07/15 1651) SpO2:  [91 %-98 %] 93 % (07/15 1651) Weight:  [83.1 kg] 83.1 kg (07/15 0101)  Recent Labs  Lab 08/02/20 2222 08/03/20 0315 08/03/20 0942 08/03/20 1417 08/03/20 1801  GLUCAP 102* 114* 130* 210* 169*   Recent Labs  Lab 08/02/20 1616 08/02/20 1654  NA 136 138  K 3.8 3.8  CL 102 104  CO2 23  --   GLUCOSE 141* 143*  BUN 15 18  CREATININE 1.22* 1.20*  CALCIUM 9.6  --    Recent Labs  Lab 08/02/20 1616  AST 15  ALT 16  ALKPHOS 57  BILITOT 0.9  PROT 6.5  ALBUMIN 3.6   Recent Labs  Lab 08/02/20 1616 08/02/20 1654  WBC 7.1  --   NEUTROABS 5.2  --   HGB 14.4 14.6  HCT 43.9 43.0  MCV 94.2  --   PLT 213  --    No results for input(s): CKTOTAL, CKMB, CKMBINDEX, TROPONINI in the last 168 hours. Recent Labs    08/02/20 1616  LABPROT 13.1  INR 1.0   No results for input(s): COLORURINE, LABSPEC, PHURINE, GLUCOSEU, HGBUR, BILIRUBINUR, KETONESUR, PROTEINUR, UROBILINOGEN, NITRITE, LEUKOCYTESUR in the last 72 hours.  Invalid input(s): APPERANCEUR     Component Value Date/Time   CHOL 278 (H) 08/03/2020 0119   TRIG 137 08/03/2020 0119   HDL 30 (L) 08/03/2020 0119   CHOLHDL 9.3 08/03/2020 0119   VLDL 27 08/03/2020 0119   LDLCALC 221 (H) 08/03/2020 0119   Lab Results  Component Value Date   HGBA1C 7.2 (H) 08/03/2020   No results found for: LABOPIA,  COCAINSCRNUR, LABBENZ, AMPHETMU, THCU, LABBARB  Recent Labs  Lab 08/02/20 1616  ETH <10    I have personally reviewed the radiological images below and agree with the radiology interpretations.  CT ANGIO HEAD W OR WO CONTRAST  Result Date: 08/02/2020 CLINICAL DATA:  Initial evaluation for neuro deficit, stroke. EXAM: CT ANGIOGRAPHY HEAD AND NECK TECHNIQUE: Multidetector CT imaging of the head and neck was performed using the standard protocol during bolus administration of intravenous contrast. Multiplanar CT image reconstructions and MIPs were obtained to evaluate the vascular anatomy. Carotid stenosis measurements (when applicable) are obtained utilizing NASCET criteria, using the distal internal carotid diameter as the denominator. CONTRAST:  75mL OMNIPAQUE IOHEXOL 350 MG/ML SOLN COMPARISON:  Prior CT from earlier the same day. FINDINGS: CTA NECK FINDINGS Aortic arch: Visualized aortic arch normal in caliber with normal 3 vessel morphology. Mild atheromatous change about the arch and origin of the great vessels without hemodynamically significant stenosis. Right carotid system: Right common and internal carotid arteries patent without stenosis, dissection or occlusion. Left carotid system: Left common and internal carotid arteries patent without stenosis, dissection, or occlusion. Mild atheromatous change about the left carotid bulb without significant stenosis. Vertebral arteries: Both vertebral arteries arise from subclavian arteries. Left vertebral artery dominant. Vertebral  arteries patent without stenosis, dissection, or occlusion. Short-segment fenestration at the right V2/V3 junction noted (series 7, image 168). Skeleton: No visible acute osseous finding. No discrete or worrisome osseous lesions. Other neck: No other acute soft tissue abnormality within the neck. Few scattered thyroid nodules noted, largest of which measures 1.3 cm on the right. Findings felt to be of doubtful significance given  size and patient age, no follow-up imaging recommended (ref: J Am Coll Radiol. 2015 Feb;12(2): 143-50).No other mass or adenopathy. Upper chest: Small layering left pleural effusion partially visualized. Visualized upper chest demonstrates no other acute findings. Review of the MIP images confirms the above findings CTA HEAD FINDINGS Anterior circulation: Horizontal petrous left ICA widely patent. Fairly severe atheromatous stenosis of up to 75% present at the or horizontal petrous right ICA (series 7, image 122). Scattered atheromatous change within the carotid siphons distally with associated mild to moderate diffuse narrowing. A1 segments patent bilaterally. Right A1 hypoplastic, accounting for the diminutive right ICA is compared to the left. Normal anterior communicating artery complex. Anterior cerebral arteries patent to their distal aspects without stenosis. No M1 stenosis or occlusion. Normal MCA bifurcations. Short-segment moderate proximal left M2 stenosis, superior division noted (series 11, image 19). Distal MCA branches well perfused and symmetric. Distal small vessel atheromatous irregularity. Posterior circulation: Both V4 segments patent to the vertebrobasilar junction without stenosis. Left vertebral artery dominant. Right PICA origin patent and normal. Left PICA not seen. Basilar patent to its distal aspect without stenosis. Superior cerebellar arteries patent bilaterally. Both PCAs primarily supplied via the basilar. Left PCA widely patent proximally. Focal severe distal left P2 stenosis noted (series 8, image 117). On the right, there is occlusion at the proximal right P2 segment (series 8, image 97). Scant attenuated flow distally within the right PCA distribution could be related to subocclusive thrombus and/or collateralization. Venous sinuses: Grossly patent allowing for timing the contrast bolus. Anatomic variants: Hypoplastic right A1 segment.  No aneurysm. Review of the MIP images  confirms the above findings IMPRESSION: 1. Acute occlusion at the proximal right P2 segment, likely embolic. Scant attenuated flow within the right PCA distribution distally could be related to subocclusive thrombus and/or collateralization. No embolic source seen elsewhere about the major arterial vasculature of the head and neck. 2. Severe 75% stenosis at the horizontal petrous right ICA. 3. Severe distal left P2 stenosis. 4. Additional scattered atheromatous change about the major arterial vasculature of the head and neck as above. No other proximal high-grade or correctable stenosis. 5. Small layering left pleural effusion, partially visualized. Electronically Signed   By: Rise Mu M.D.   On: 08/02/2020 22:52   CT HEAD WO CONTRAST  Result Date: 08/02/2020 CLINICAL DATA:  Mental status change, unknown cause. Additional history provided: Patient reports "not feeling like herself." Patient reports vision "isn't up to par." Patient reports dizziness and confusion. EXAM: CT HEAD WITHOUT CONTRAST TECHNIQUE: Contiguous axial images were obtained from the base of the skull through the vertex without intravenous contrast. COMPARISON:  Head CT 11/07/2016.  Brain MRI 07/18/2013. FINDINGS: Brain: Mild generalized cerebral and cerebellar atrophy. Abnormal cortical/subcortical hypodensity within the right temporal and occipital lobes measuring 8.9 x 2.6 x 1.4 cm (AP x TV x CC) compatible with acute right PCA territory infarction. Acute infarction changes are also present within the callosal splenium on the right. Chronic lacunar infarct within the left lentiform nucleus not definitively present on the prior head CT of 11/07/2016. Background mild patchy and ill-defined hypoattenuation within the  cerebral white matter, nonspecific but compatible chronic small vessel ischemic disease. Redemonstrated nonspecific linear calcifications within the inferior pons (series 3, image 26). There is no acute intracranial  hemorrhage. No extra-axial fluid collection. No evidence of an intracranial mass. No midline shift. Vascular: No hyperdense vessel.  Atherosclerotic calcifications. Skull: Normal. Negative for fracture or focal lesion. Sinuses/Orbits: Visualized orbits show no acute finding. Mild bilateral frontal and ethmoid sinus mucosal thickening. These results were called by telephone at the time of interpretation on 08/02/2020 at 5:31 pm to provider [facility], who verbally acknowledged these results. IMPRESSION: Large acute cortical/subcortical right PCA territory infarct within the right temporal and occipital lobes, as well as callosal splenium. No significant mass effect at this time. No evidence of hemorrhagic conversion. Chronic lacunar infarct within the left basal ganglia, not definitively present on the prior head CT of 11/07/2016. Background mild generalized parenchymal atrophy and mild cerebral white matter chronic small vessel ischemic disease. Electronically Signed   By: Jackey Loge DO   On: 08/02/2020 17:33   CT ANGIO NECK W OR WO CONTRAST  Result Date: 08/02/2020 CLINICAL DATA:  Initial evaluation for neuro deficit, stroke. EXAM: CT ANGIOGRAPHY HEAD AND NECK TECHNIQUE: Multidetector CT imaging of the head and neck was performed using the standard protocol during bolus administration of intravenous contrast. Multiplanar CT image reconstructions and MIPs were obtained to evaluate the vascular anatomy. Carotid stenosis measurements (when applicable) are obtained utilizing NASCET criteria, using the distal internal carotid diameter as the denominator. CONTRAST:  75mL OMNIPAQUE IOHEXOL 350 MG/ML SOLN COMPARISON:  Prior CT from earlier the same day. FINDINGS: CTA NECK FINDINGS Aortic arch: Visualized aortic arch normal in caliber with normal 3 vessel morphology. Mild atheromatous change about the arch and origin of the great vessels without hemodynamically significant stenosis. Right carotid system: Right  common and internal carotid arteries patent without stenosis, dissection or occlusion. Left carotid system: Left common and internal carotid arteries patent without stenosis, dissection, or occlusion. Mild atheromatous change about the left carotid bulb without significant stenosis. Vertebral arteries: Both vertebral arteries arise from subclavian arteries. Left vertebral artery dominant. Vertebral arteries patent without stenosis, dissection, or occlusion. Short-segment fenestration at the right V2/V3 junction noted (series 7, image 168). Skeleton: No visible acute osseous finding. No discrete or worrisome osseous lesions. Other neck: No other acute soft tissue abnormality within the neck. Few scattered thyroid nodules noted, largest of which measures 1.3 cm on the right. Findings felt to be of doubtful significance given size and patient age, no follow-up imaging recommended (ref: J Am Coll Radiol. 2015 Feb;12(2): 143-50).No other mass or adenopathy. Upper chest: Small layering left pleural effusion partially visualized. Visualized upper chest demonstrates no other acute findings. Review of the MIP images confirms the above findings CTA HEAD FINDINGS Anterior circulation: Horizontal petrous left ICA widely patent. Fairly severe atheromatous stenosis of up to 75% present at the or horizontal petrous right ICA (series 7, image 122). Scattered atheromatous change within the carotid siphons distally with associated mild to moderate diffuse narrowing. A1 segments patent bilaterally. Right A1 hypoplastic, accounting for the diminutive right ICA is compared to the left. Normal anterior communicating artery complex. Anterior cerebral arteries patent to their distal aspects without stenosis. No M1 stenosis or occlusion. Normal MCA bifurcations. Short-segment moderate proximal left M2 stenosis, superior division noted (series 11, image 19). Distal MCA branches well perfused and symmetric. Distal small vessel atheromatous  irregularity. Posterior circulation: Both V4 segments patent to the vertebrobasilar junction without stenosis.  Left vertebral artery dominant. Right PICA origin patent and normal. Left PICA not seen. Basilar patent to its distal aspect without stenosis. Superior cerebellar arteries patent bilaterally. Both PCAs primarily supplied via the basilar. Left PCA widely patent proximally. Focal severe distal left P2 stenosis noted (series 8, image 117). On the right, there is occlusion at the proximal right P2 segment (series 8, image 97). Scant attenuated flow distally within the right PCA distribution could be related to subocclusive thrombus and/or collateralization. Venous sinuses: Grossly patent allowing for timing the contrast bolus. Anatomic variants: Hypoplastic right A1 segment.  No aneurysm. Review of the MIP images confirms the above findings IMPRESSION: 1. Acute occlusion at the proximal right P2 segment, likely embolic. Scant attenuated flow within the right PCA distribution distally could be related to subocclusive thrombus and/or collateralization. No embolic source seen elsewhere about the major arterial vasculature of the head and neck. 2. Severe 75% stenosis at the horizontal petrous right ICA. 3. Severe distal left P2 stenosis. 4. Additional scattered atheromatous change about the major arterial vasculature of the head and neck as above. No other proximal high-grade or correctable stenosis. 5. Small layering left pleural effusion, partially visualized. Electronically Signed   By: Rise Mu M.D.   On: 08/02/2020 22:52   MR BRAIN WO CONTRAST  Result Date: 08/02/2020 CLINICAL DATA:  Initial evaluation for acute stroke. EXAM: MRI HEAD WITHOUT CONTRAST TECHNIQUE: Multiplanar, multiecho pulse sequences of the brain and surrounding structures were obtained without intravenous contrast. COMPARISON:  Prior CT and CTA from earlier the same day. FINDINGS: Brain: Cerebral volume within normal limits  for age. Patchy and confluent T2/FLAIR hyperintensity within the periventricular and deep white matter both cerebral hemispheres as well as the pons, most consistent with chronic small vessel ischemic disease, moderate in nature. Few scattered superimposed remote lacunar infarcts noted about the basal ganglia and right thalamus. Patchy restricted diffusion involving the parasagittal right temporal occipital region consistent with a moderate-sized acute right PCA distribution infarct. Mild patchy involvement of the right thalamus and splenium. Minimal associated petechial hemorrhage without frank hemorrhagic transformation or significant mass effect. No other evidence for acute or subacute ischemia. Gray-white matter differentiation otherwise maintained. No encephalomalacia to suggest chronic cortical infarction elsewhere within the brain. No other evidence for acute or chronic intracranial hemorrhage. No mass lesion, midline shift or mass effect. No hydrocephalus or extra-axial fluid collection. Pituitary gland suprasellar region normal. Midline structures intact. Vascular: Major intracranial vascular flow voids are maintained. Skull and upper cervical spine: Craniocervical junction within normal limits. Bone marrow signal intensity normal. No scalp soft tissue abnormality. Sinuses/Orbits: Patient status post bilateral ocular lens replacement. Right gaze noted. Scattered mucosal thickening noted throughout the paranasal sinuses. No mastoid effusion. Other: None. IMPRESSION: 1. Moderate-sized acute right PCA distribution infarct as above. Associated faint petechial hemorrhage without frank hemorrhagic transformation or significant mass effect. 2. Underlying moderate chronic microvascular ischemic disease with a few scattered remote lacunar infarcts about the basal ganglia and right thalamus. Electronically Signed   By: Rise Mu M.D.   On: 08/02/2020 23:03   MR LUMBAR SPINE WO CONTRAST  Result Date:  07/27/2020 CLINICAL DATA:  Low back pain with right hip and leg pain. Weakness for 6 months. EXAM: MRI LUMBAR SPINE WITHOUT CONTRAST TECHNIQUE: Multiplanar, multisequence MR imaging of the lumbar spine was performed. No intravenous contrast was administered. COMPARISON:  03/24/2020 FINDINGS: Segmentation:  5 lumbar type vertebrae Alignment:  Stable and unremarkable Vertebrae:  No fracture, evidence of discitis, or bone  lesion. Conus medullaris and cauda equina: Conus extends to the T12-L1 level. Conus and cauda equina appear normal. Paraspinal and other soft tissues: Stable generalized subcutaneous edematous appearance in the back. Evidence of prior right-sided decompression at this level Disc levels: T12- L1: Unremarkable. L1-L2: Small right paracentral protrusion L2-L3: Mild disc bulging and facet spurring L3-L4: Disc narrowing and bulging. Facet spurring and ligamentum flavum thickening. Mild spinal stenosis. Stable left subarticular recess narrowing that could affect the left L4 nerve root L4-L5: Disc narrowing and bulging with right paracentral herniation that has enlarged with extrusion appearance that causes high-grade spinal stenosis with severe right L5 impingement. Mild facet spurring. Mild bilateral foraminal narrowing greater on the right L5-S1:Unremarkable. IMPRESSION: 1. L4-5 high-grade spinal and right subarticular recess stenosis due primarily to a large extrusion. 2. L3-4 chronic left subarticular recess stenosis that could affect the left L4 nerve root. Electronically Signed   By: Marnee Spring M.D.   On: 07/27/2020 07:25   DG Chest Port 1 View  Result Date: 08/03/2020 CLINICAL DATA:  COVID. EXAM: PORTABLE CHEST 1 VIEW COMPARISON:  Chest x-ray dated April 16, 2011. FINDINGS: The heart size and mediastinal contours are within normal limits. Both lungs are clear. The visualized skeletal structures are unremarkable. IMPRESSION: No active disease. Electronically Signed   By: Obie Dredge M.D.    On: 08/03/2020 14:48   ECHOCARDIOGRAM COMPLETE  Result Date: 08/03/2020    ECHOCARDIOGRAM REPORT   Patient Name:   Yolanda Saunders Date of Exam: 08/03/2020 Medical Rec #:  [id number]         Height:       63.0 in Accession #:    [id number]        Weight:       183.2 lb Date of Birth:  1945-05-24         BSA:          1.863 m Patient Age:    74 years          BP:           138/92 mmHg Patient Gender: F                 HR:           71 bpm. Exam Location:  Inpatient Procedure: 2D Echo, Cardiac Doppler and Color Doppler Indications:    Stroke  History:        Patient has prior history of Echocardiogram examinations, most                 recent 10/27/2016. Stroke; Risk Factors:Former Smoker, Diabetes,                 Dyslipidemia and Hypertension.  Sonographer:    Shirlean Kelly Referring Phys: [id number] CHING T TU IMPRESSIONS  1. No left ventricular thrombus is seen with Definity contrast. Left ventricular ejection fraction, by estimation, is 35 to 40%. The left ventricle has moderately decreased function. The left ventricle has no regional wall motion abnormalities. Left ventricular diastolic parameters are consistent with Grade I diastolic dysfunction (impaired relaxation). There is severe hypokinesis of the left ventricular, mid-apical anteroseptal wall and anterior wall. There is mild dyskinesis of the left ventricular, entire apical segment.  2. Right ventricular systolic function is normal. The right ventricular size is normal. Tricuspid regurgitation signal is inadequate for assessing PA pressure.  3. The mitral valve is normal in structure. Trivial mitral valve regurgitation. No evidence of mitral stenosis.  4. The aortic  valve is normal in structure. Aortic valve regurgitation is mild. No aortic stenosis is present.  5. The inferior vena cava is normal in size with greater than 50% respiratory variability, suggesting right atrial pressure of 3 mmHg. Comparison(s): Prior images unable to be directly viewed,  comparison made by report only. Changes from prior study are noted. The left ventricular function is significantly worse. The left ventricular wall motion abnormalities are new. FINDINGS  Left Ventricle: No left ventricular thrombus is seen with Definity contrast. Left ventricular ejection fraction, by estimation, is 35 to 40%. The left ventricle has moderately decreased function. The left ventricle has no regional wall motion abnormalities. Severe hypokinesis of the left ventricular, mid-apical anteroseptal wall and anterior wall. Mild dyskinesis of the left ventricular, entire apical segment. The left ventricular internal cavity size was normal in size. There is no left ventricular hypertrophy. Left ventricular diastolic parameters are consistent with Grade I diastolic dysfunction (impaired relaxation).  LV Wall Scoring: The apical lateral segment, apical septal segment, apical inferior segment, and apex are dyskinetic. The mid and distal anterior wall and mid anteroseptal segment are hypokinetic. Right Ventricle: The right ventricular size is normal. No increase in right ventricular wall thickness. Right ventricular systolic function is normal. Tricuspid regurgitation signal is inadequate for assessing PA pressure. Left Atrium: Left atrial size was normal in size. Right Atrium: Right atrial size was normal in size. Pericardium: There is no evidence of pericardial effusion. Mitral Valve: The mitral valve is normal in structure. Trivial mitral valve regurgitation. No evidence of mitral valve stenosis. Tricuspid Valve: The tricuspid valve is normal in structure. Tricuspid valve regurgitation is not demonstrated. No evidence of tricuspid stenosis. Aortic Valve: The aortic valve is normal in structure. Aortic valve regurgitation is mild. No aortic stenosis is present. Aortic valve mean gradient measures 5.0 mmHg. Aortic valve peak gradient measures 9.2 mmHg. Aortic valve area, by VTI measures 1.81 cm. Pulmonic Valve:  The pulmonic valve was normal in structure. Pulmonic valve regurgitation is not visualized. No evidence of pulmonic stenosis. Aorta: The aortic root is normal in size and structure. Venous: The inferior vena cava is normal in size with greater than 50% respiratory variability, suggesting right atrial pressure of 3 mmHg. IAS/Shunts: No atrial level shunt detected by color flow Doppler.  LEFT VENTRICLE PLAX 2D LVIDd:         5.10 cm  Diastology LVIDs:         3.70 cm  LV e' medial:    4.57 cm/s LV PW:         1.00 cm  LV E/e' medial:  14.4 LV IVS:        1.10 cm  LV e' lateral:   5.11 cm/s LVOT diam:     1.80 cm  LV E/e' lateral: 12.9 LV SV:         54 LV SV Index:   29 LVOT Area:     2.54 cm  RIGHT VENTRICLE          IVC RV Basal diam:  3.20 cm  IVC diam: 1.00 cm LEFT ATRIUM             Index       RIGHT ATRIUM           Index LA diam:        3.50 cm 1.88 cm/m  RA Area:     14.50 cm LA Vol (A2C):   36.7 ml 19.70 ml/m RA Volume:   34.60 ml  18.57 ml/m LA Vol (A4C):   43.0 ml 23.08 ml/m LA Biplane Vol: 40.6 ml 21.79 ml/m  AORTIC VALVE AV Area (Vmax):    1.74 cm AV Area (Vmean):   1.51 cm AV Area (VTI):     1.81 cm AV Vmax:           152.00 cm/s AV Vmean:          105.000 cm/s AV VTI:            0.296 m AV Peak Grad:      9.2 mmHg AV Mean Grad:      5.0 mmHg LVOT Vmax:         104.00 cm/s LVOT Vmean:        62.200 cm/s LVOT VTI:          0.211 m LVOT/AV VTI ratio: 0.71  AORTA Ao Root diam: 3.20 cm Ao Asc diam:  3.40 cm MITRAL VALVE MV Area (PHT): 4.49 cm     SHUNTS MV Decel Time: 169 msec     Systemic VTI:  0.21 m MV E velocity: 65.70 cm/s   Systemic Diam: 1.80 cm MV A velocity: 110.00 cm/s MV E/A ratio:  0.60 Mihai Croitoru MD Electronically signed by Thurmon Fair MD Signature Date/Time: 08/03/2020/12:28:56 PM    Final    VAS [place] LOWER EXTREMITY VENOUS (DVT)  Result Date: 08/03/2020  Lower Venous DVT Study Patient Name:  Yolanda Saunders  Date of Exam:   08/03/2020 Medical Rec #: [id number]           Accession #:    [id number] Date of Birth: [birth year]          Patient Gender: F Patient Age:   74Y Exam Location:  [facility] Procedure:      VAS [place] LOWER EXTREMITY VENOUS (DVT) Referring Phys: [id number] Marvel Plan --------------------------------------------------------------------------------  Indications: Embolic stroke, BHALP-[id number].  Comparison Study: No prior studies. Performing Technologist: Jean Rosenthal RDMS,RVT  Examination Guidelines: A complete evaluation includes B-mode imaging, spectral Doppler, color Doppler, and power Doppler as needed of all accessible portions of each vessel. Bilateral testing is considered an integral part of a complete examination. Limited examinations for reoccurring indications may be performed as noted. The reflux portion of the exam is performed with the patient in reverse Trendelenburg.  +---------+---------------+---------+-----------+----------+--------------+ RIGHT    CompressibilityPhasicitySpontaneityPropertiesThrombus Aging +---------+---------------+---------+-----------+----------+--------------+ CFV      Full           Yes      Yes                                 +---------+---------------+---------+-----------+----------+--------------+ SFJ      Full                                                        +---------+---------------+---------+-----------+----------+--------------+ FV Prox  Full                                                        +---------+---------------+---------+-----------+----------+--------------+ FV Mid   Full                                                        +---------+---------------+---------+-----------+----------+--------------+  FV DistalFull                                                        +---------+---------------+---------+-----------+----------+--------------+ PFV      Full                                                         +---------+---------------+---------+-----------+----------+--------------+ POP      Full           Yes      Yes                                 +---------+---------------+---------+-----------+----------+--------------+ PTV      Full                                                        +---------+---------------+---------+-----------+----------+--------------+ PERO     Full                                                        +---------+---------------+---------+-----------+----------+--------------+ Gastroc  Full                                                        +---------+---------------+---------+-----------+----------+--------------+   +---------+---------------+---------+-----------+----------+--------------+ LEFT     CompressibilityPhasicitySpontaneityPropertiesThrombus Aging +---------+---------------+---------+-----------+----------+--------------+ CFV      Full           Yes      Yes                                 +---------+---------------+---------+-----------+----------+--------------+ SFJ      Full                                                        +---------+---------------+---------+-----------+----------+--------------+ FV Prox  Full                                                        +---------+---------------+---------+-----------+----------+--------------+ FV Mid   Full                                                        +---------+---------------+---------+-----------+----------+--------------+  FV DistalFull                                                        +---------+---------------+---------+-----------+----------+--------------+ PFV      Full                                                        +---------+---------------+---------+-----------+----------+--------------+ POP      Full           Yes      Yes                                  +---------+---------------+---------+-----------+----------+--------------+ PTV      Full                                                        +---------+---------------+---------+-----------+----------+--------------+ PERO     Full                                                        +---------+---------------+---------+-----------+----------+--------------+ Gastroc  Full                                                        +---------+---------------+---------+-----------+----------+--------------+     Summary: RIGHT: - There is no evidence of deep vein thrombosis in the lower extremity.  - No cystic structure found in the popliteal fossa.  LEFT: - There is no evidence of deep vein thrombosis in the lower extremity.  - No cystic structure found in the popliteal fossa.  *See table(s) above for measurements and observations. Electronically signed by Sherald Hesshristopher Clark MD on 08/03/2020 at 6:38:36 PM.    Final      PHYSICAL EXAM  Temp:  [97.7 F (36.5 C)-98.5 F (36.9 C)] 98.5 F (36.9 C) (07/15 1651) Pulse Rate:  [71-87] 87 (07/15 1651) Resp:  [13-21] 18 (07/15 1651) BP: (93-152)/(54-92) 134/68 (07/15 1651) SpO2:  [91 %-98 %] 93 % (07/15 1651) Weight:  [83.1 kg] 83.1 kg (07/15 0101)  General - Well nourished, well developed, in no apparent distress.  Ophthalmologic - fundi not visualized due to noncooperation.  Cardiovascular - Regular rhythm and rate.  Mental Status -  Level of arousal and orientation to months, place were intact, however, stated her age 75 instead of [age], and not orientated to the year. Language including expression, repetition, comprehension was assessed and found intact, however has difficulty with naming 1/4.  Cranial Nerves II - XII - II - left hemianopia III, IV, VI - Extraocular movements intact. V - Facial sensation intact bilaterally. VII - Facial movement intact bilaterally.  VIII - Hearing & vestibular intact bilaterally. X - Palate  elevates symmetrically. XI - Chin turning & shoulder shrug intact bilaterally. XII - Tongue protrusion intact.  Motor Strength - The patient's strength was normal in all extremities and pronator drift was absent.  Bulk was normal and fasciculations were absent.   Motor Tone - Muscle tone was assessed at the neck and appendages and was normal.  Reflexes - The patient's reflexes were symmetrical in all extremities and she had no pathological reflexes.  Sensory - Light touch, temperature/pinprick were assessed and were symmetrical.    Coordination - The patient had normal movements in the hands with no ataxia or dysmetria.  Tremor was absent.  Gait and Station - deferred.   ASSESSMENT/PLAN Yolanda Saunders is a 75 y.o. female with history of hypertension, hyperlipidemia, diabetes, migraine admitted for vision changes and confusion for 2 days. No tPA given due to outside window.    Stroke:  right large PCA infarct embolic secondary to unclear source, concerning for cardioembolic  CT head showed subacute right large PCA infarct CTA head and neck showed right P2 occlusion, left P2 stenosis, right ICA petrous portion 75% stenosis MRI right large PCA infarct including right thalamus LE venous Doppler negative for DVT 2D Echo EF 35 to 40% Recommend loop recorder placement to rule out A. fib.  However, due to patient COVID status, recommend outpatient follow-up with cardiology for loop recorder placement.  Dr. Elberta Fortis is aware. LDL 221 HgbA1c 7.2 SCDs for VTE prophylaxis aspirin 81 mg daily prior to admission, now on aspirin 81 mg daily and clopidogrel 75 mg daily DAPT for 3 months and then Plavix alone. Patient counseled to be compliant with her antithrombotic medications Ongoing aggressive stroke risk factor management Therapy recommendations: Pending Disposition: Pending  Cardiomyopathy 10/2016 EF 50 to 55% This admission EF 35 to 40% Recommend cardiology consult in am  COVID  infection  COVID positive this admission On remdesivir Treatment per primary team  Diabetes HgbA1c 7.2 goal < 7.0 Uncontrolled CBG monitoring SSI DM education and close PCP follow up  Hypertension Stable Gradually normalized within 2-3 days. Long term BP goal normotensive  Hyperlipidemia Home meds: Lipitor 40 LDL 221, goal < 70 Now on Lipitor 80 Continue statin at discharge  Other Stroke Risk Factors Advanced age Obesity, Body mass index is 32.45 kg/m.  Migraines  Other Active Problems CKD stage IIIa, creatinine 1.22  Hospital day # 0  Neurology will sign off. Please call with questions. Pt will follow up with stroke clinic NP at [facility] in about 4 weeks. Thanks for the consult.   Marvel Plan, MD PhD Stroke Neurology 08/03/2020 7:08 PM    To contact Stroke Continuity provider, please refer to [email or web address]. After hours, contact General Neurology

## 2020-08-03 NOTE — Progress Notes (Signed)
Lower extremity venous bilateral study completed.   Please see CV Proc for preliminary results.   Elga Santy, RDMS, RVT  

## 2020-08-03 NOTE — Plan of Care (Signed)
  Problem: Education: Goal: Knowledge of General Education information will improve Description: Including pain rating scale, medication(s)/side effects and non-pharmacologic comfort measures Outcome: Progressing   Problem: Health Behavior/Discharge Planning: Goal: Ability to manage health-related needs will improve 08/03/2020 0256 by Humphrey Rolls, RN Outcome: Progressing 08/03/2020 0119 by Humphrey Rolls, RN Outcome: Progressing   Problem: Clinical Measurements: Goal: Ability to maintain clinical measurements within normal limits will improve Outcome: Progressing

## 2020-08-03 NOTE — Progress Notes (Signed)
PROGRESS NOTE    Yolanda Saunders  VOJ:[id number] DOB: [birth year] DOA: 08/02/2020 PCP: Johny Blamer, MD    No chief complaint on file.   Brief Narrative:   Yolanda Saunders is a 75 y.o. female with medical history significant for CVA, hypertension, IBS, insulin-dependent type 2 diabetes, CKD stage IIIa, asthma and hyperlipidemia who presents with concerns of increasing confusion.   About 2 days ago has been began to note that she was having confusion.  She was confused about how to use a remote and her phone.  Also her speech was not making much sense.  She also noticed some changes to her vision although denies double vision.  Denies any focal extremity weakness.  She is mostly wheelchair-bound due to right hip pain and recently underwent lumbar microdiscectomy on 5/13.  Her right hip pain recently returned and she has repeat surgery scheduled for next week. Patient has recently been undergoing a lot of stress.  Her son recently passed and his funeral is tomorrow afternoon.   ED Course: She was afebrile normotensive on room air.  CBC unremarkable.  Creatinine stable at baseline of 1.22.  BG of 141. CT head showed a large right PCA subacute cortical infarct  Assessment & Plan:   Principal Problem:   Stroke [facility]) Active Problems:   Hyperlipidemia   Type 2 diabetes mellitus with stage 3 chronic kidney disease, with long-term current use of insulin (HCC)   CKD (chronic kidney disease), stage III (HCC)   COVID-19 virus infection   Acute CVA -MRI brain significant for moderate size acute right PCA distribution infarct, underlying moderate chronic microvascular ischemic disease, with few scattered remote lacunar infarcts in the basal ganglia and right thalamus -CTA head and neck significant for acute right P2 occlusion, with severe 75% stenosis in the right PCA, with severe distal left P2 stenosis. -Currently on aspirin and Plavix, will await further recommendation from  neurology. -2D echo is pending -LDL significantly elevated at 221 -A1c is elevated at 7.2 -PT/OT consulted    COVID virus infection -Patient is unvaccinated -At risk of COVID complication, so we will treat with 3 days of IV remdesivir especially she does presents with acute CVA which may be contributing to her stroke ,and will extend to 5 days if she becomes symptomatic -We will check chest x-ray to rule out any pneumonia   Insulin-dependent type 2 diabetes Hemoglobin A1c of 9.1 on 03/2020 -Continue with insulin sliding scale   Hypertension - Allow for permissive hypertension during stroke work-up   CKD stage IIIa - Creatinine stable   Hyperlipidemia - Continue statin  DVT prophylaxis: Lovenox Code Status: Full Family Communication: Discussed with husband by phone Disposition:   Status is: inpatient  The patient will require care spanning > 2 midnights and should be moved to inpatient because: IV treatments appropriate due to intensity of illness or inability to take PO  Dispo: The patient is from: Home              Anticipated d/c is to: Home              Patient currently is not medically stable to d/c.  She will need further treatment and work-up for her stroke, as well will need IV remdesivir to treat COVID as is likely contributing to her stroke as well.   Difficult to place patient No       Consultants:  Neurology  Subjective:  Patient reports she is feeling off, not at baseline, she  herself reports she is feeling confused  Objective: Vitals:   08/03/20 0322 08/03/20 0400 08/03/20 0500 08/03/20 0900  BP: 130/62 97/82 (!) 138/92 (!) 148/65  Pulse: 82 78 71 83  Resp: Temp: 97.9 F (36.6 C) 97.7 F (36.5 C) 97.8 F (36.6 C) 97.9 F (36.6 C)  TempSrc: Oral Oral Axillary Oral  SpO2: 95% 92% 97% 96%  Weight:      Height:        Intake/Output Summary (Last 24 hours) at 08/03/2020 1220 Last data filed at 08/03/2020 0535 Gross per 24 hour   Intake 532.41 ml  Output 400 ml  Net 132.41 ml   Filed Weights   08/03/20 0101  Weight: 83.1 kg    Examination:  Awake Alert, Oriented X 2, she is with a flat affect, and some confusion Symmetrical Chest wall movement, Good air movement bilaterally, CTAB RRR,No Gallops,Rubs or new Murmurs, No Parasternal Heave +ve B.Sounds, Abd Soft, No tenderness, No rebound - guarding or rigidity. No Cyanosis, Clubbing or edema, No new Rash or bruise     Data Reviewed: I have personally reviewed following labs and imaging studies  CBC: Recent Labs  Lab 08/02/20 1616 08/02/20 1654  WBC 7.1  --   NEUTROABS 5.2  --   HGB 14.4 14.6  HCT 43.9 43.0  MCV 94.2  --   PLT 213  --     Basic Metabolic Panel: Recent Labs  Lab 08/02/20 1616 08/02/20 1654  NA 136 138  K 3.8 3.8  CL 102 104  CO2 23  --   GLUCOSE 141* 143*  BUN 15 18  CREATININE 1.22* 1.20*  CALCIUM 9.6  --     GFR: Estimated Creatinine Clearance: 40.7 mL/min (A) (by C-G formula based on SCr of 1.2 mg/dL (H)).  Liver Function Tests: Recent Labs  Lab 08/02/20 1616  AST 15  ALT 16  ALKPHOS 57  BILITOT 0.9  PROT 6.5  ALBUMIN 3.6    CBG: Recent Labs  Lab 08/02/20 1727 08/02/20 2222 08/03/20 0315 08/03/20 0942  GLUCAP 140* 102* 114* 130*     Recent Results (from the past 240 hour(s))  SARS CORONAVIRUS 2 (TAT 6-24 HRS) Nasopharyngeal Nasopharyngeal Swab     Status: Abnormal   Collection Time: 08/02/20  9:37 PM   Specimen: Nasopharyngeal Swab  Result Value Ref Range Status   SARS Coronavirus 2 POSITIVE (A) NEGATIVE Final    Comment: (NOTE) SARS-CoV-2 target nucleic acids are DETECTED.  The SARS-CoV-2 RNA is generally detectable in upper and lower respiratory specimens during the acute phase of infection. Positive results are indicative of the presence of SARS-CoV-2 RNA. Clinical correlation with patient history and other diagnostic information is  necessary to determine patient infection status.  Positive results do not rule out bacterial infection or co-infection with other viruses.  The expected result is Negative.  Fact Sheet for Patients: HairSlick.no  Fact Sheet for Healthcare Providers: quierodirigir.com  This test is not yet approved or cleared by the [place] FDA and  has been authorized for detection and/or diagnosis of SARS-CoV-2 by FDA under an Emergency Use Authorization (EUA). This EUA will remain  in effect (meaning this test can be used) for the duration of the COVID-19 declaration under Section 564(b)(1) of the Act, 21 U. S.C. section 360bbb-3(b)(1), unless the authorization is terminated or revoked sooner.   Performed at [facility], [address] [place], [place] [address]   MRSA Next Gen by  PCR, Nasal     Status: None   Collection Time: 08/03/20  1:28 AM   Specimen: Nasal Mucosa; Nasal Swab  Result Value Ref Range Status   MRSA by PCR Next Gen NOT DETECTED NOT DETECTED Final    Comment: (NOTE) The GeneXpert MRSA Assay (FDA approved for NASAL specimens only), is one component of a comprehensive MRSA colonization surveillance program. It is not intended to diagnose MRSA infection nor to guide or monitor treatment for MRSA infections. Test performance is not FDA approved in patients less than [address]103 years old. Performed at [facility], [address] [place], [place] [id number]          Radiology Studies: CT ANGIO HEAD W OR WO CONTRAST  Result Date: 08/02/2020 CLINICAL DATA:  Initial evaluation for neuro deficit, stroke. EXAM: CT ANGIOGRAPHY HEAD AND NECK TECHNIQUE: Multidetector CT imaging of the head and neck was performed using the standard protocol during bolus administration of intravenous contrast. Multiplanar CT image reconstructions and MIPs were obtained to evaluate the vascular anatomy. Carotid stenosis measurements (when applicable) are obtained utilizing NASCET  criteria, using the distal internal carotid diameter as the denominator. CONTRAST:  75mL OMNIPAQUE IOHEXOL 350 MG/ML SOLN COMPARISON:  Prior CT from earlier the same day. FINDINGS: CTA NECK FINDINGS Aortic arch: Visualized aortic arch normal in caliber with normal 3 vessel morphology. Mild atheromatous change about the arch and origin of the great vessels without hemodynamically significant stenosis. Right carotid system: Right common and internal carotid arteries patent without stenosis, dissection or occlusion. Left carotid system: Left common and internal carotid arteries patent without stenosis, dissection, or occlusion. Mild atheromatous change about the left carotid bulb without significant stenosis. Vertebral arteries: Both vertebral arteries arise from subclavian arteries. Left vertebral artery dominant. Vertebral arteries patent without stenosis, dissection, or occlusion. Short-segment fenestration at the right V2/V3 junction noted (series 7, image 168). Skeleton: No visible acute osseous finding. No discrete or worrisome osseous lesions. Other neck: No other acute soft tissue abnormality within the neck. Few scattered thyroid nodules noted, largest of which measures 1.3 cm on the right. Findings felt to be of doubtful significance given size and patient age, no follow-up imaging recommended (ref: J Am Coll Radiol. 2015 Feb;12(2): 143-50).No other mass or adenopathy. Upper chest: Small layering left pleural effusion partially visualized. Visualized upper chest demonstrates no other acute findings. Review of the MIP images confirms the above findings CTA HEAD FINDINGS Anterior circulation: Horizontal petrous left ICA widely patent. Fairly severe atheromatous stenosis of up to 75% present at the or horizontal petrous right ICA (series 7, image 122). Scattered atheromatous change within the carotid siphons distally with associated mild to moderate diffuse narrowing. A1 segments patent bilaterally. Right A1  hypoplastic, accounting for the diminutive right ICA is compared to the left. Normal anterior communicating artery complex. Anterior cerebral arteries patent to their distal aspects without stenosis. No M1 stenosis or occlusion. Normal MCA bifurcations. Short-segment moderate proximal left M2 stenosis, superior division noted (series 11, image 19). Distal MCA branches well perfused and symmetric. Distal small vessel atheromatous irregularity. Posterior circulation: Both V4 segments patent to the vertebrobasilar junction without stenosis. Left vertebral artery dominant. Right PICA origin patent and normal. Left PICA not seen. Basilar patent to its distal aspect without stenosis. Superior cerebellar arteries patent bilaterally. Both PCAs primarily supplied via the basilar. Left PCA widely patent proximally. Focal severe distal left P2 stenosis noted (series 8, image 117). On the right, there is occlusion at the proximal right  P2 segment (series 8, image 97). Scant attenuated flow distally within the right PCA distribution could be related to subocclusive thrombus and/or collateralization. Venous sinuses: Grossly patent allowing for timing the contrast bolus. Anatomic variants: Hypoplastic right A1 segment.  No aneurysm. Review of the MIP images confirms the above findings IMPRESSION: 1. Acute occlusion at the proximal right P2 segment, likely embolic. Scant attenuated flow within the right PCA distribution distally could be related to subocclusive thrombus and/or collateralization. No embolic source seen elsewhere about the major arterial vasculature of the head and neck. 2. Severe 75% stenosis at the horizontal petrous right ICA. 3. Severe distal left P2 stenosis. 4. Additional scattered atheromatous change about the major arterial vasculature of the head and neck as above. No other proximal high-grade or correctable stenosis. 5. Small layering left pleural effusion, partially visualized. Electronically Signed   By:  Rise Mu M.D.   On: 08/02/2020 22:52   CT HEAD WO CONTRAST  Result Date: 08/02/2020 CLINICAL DATA:  Mental status change, unknown cause. Additional history provided: Patient reports "not feeling like herself." Patient reports vision "isn't up to par." Patient reports dizziness and confusion. EXAM: CT HEAD WITHOUT CONTRAST TECHNIQUE: Contiguous axial images were obtained from the base of the skull through the vertex without intravenous contrast. COMPARISON:  Head CT 11/07/2016.  Brain MRI 07/18/2013. FINDINGS: Brain: Mild generalized cerebral and cerebellar atrophy. Abnormal cortical/subcortical hypodensity within the right temporal and occipital lobes measuring 8.9 x 2.6 x 1.4 cm (AP x TV x CC) compatible with acute right PCA territory infarction. Acute infarction changes are also present within the callosal splenium on the right. Chronic lacunar infarct within the left lentiform nucleus not definitively present on the prior head CT of 11/07/2016. Background mild patchy and ill-defined hypoattenuation within the cerebral white matter, nonspecific but compatible chronic small vessel ischemic disease. Redemonstrated nonspecific linear calcifications within the inferior pons (series 3, image 26). There is no acute intracranial hemorrhage. No extra-axial fluid collection. No evidence of an intracranial mass. No midline shift. Vascular: No hyperdense vessel.  Atherosclerotic calcifications. Skull: Normal. Negative for fracture or focal lesion. Sinuses/Orbits: Visualized orbits show no acute finding. Mild bilateral frontal and ethmoid sinus mucosal thickening. These results were called by telephone at the time of interpretation on 08/02/2020 at 5:31 pm to provider PA [facility], who verbally acknowledged these results. IMPRESSION: Large acute cortical/subcortical right PCA territory infarct within the right temporal and occipital lobes, as well as callosal splenium. No significant mass effect at this time.  No evidence of hemorrhagic conversion. Chronic lacunar infarct within the left basal ganglia, not definitively present on the prior head CT of 11/07/2016. Background mild generalized parenchymal atrophy and mild cerebral white matter chronic small vessel ischemic disease. Electronically Signed   By: Jackey Loge DO   On: 08/02/2020 17:33   CT ANGIO NECK W OR WO CONTRAST  Result Date: 08/02/2020 CLINICAL DATA:  Initial evaluation for neuro deficit, stroke. EXAM: CT ANGIOGRAPHY HEAD AND NECK TECHNIQUE: Multidetector CT imaging of the head and neck was performed using the standard protocol during bolus administration of intravenous contrast. Multiplanar CT image reconstructions and MIPs were obtained to evaluate the vascular anatomy. Carotid stenosis measurements (when applicable) are obtained utilizing NASCET criteria, using the distal internal carotid diameter as the denominator. CONTRAST:  9mL OMNIPAQUE IOHEXOL 350 MG/ML SOLN COMPARISON:  Prior CT from earlier the same day. FINDINGS: CTA NECK FINDINGS Aortic arch: Visualized aortic arch normal in caliber with normal 3 vessel morphology. Mild atheromatous  change about the arch and origin of the great vessels without hemodynamically significant stenosis. Right carotid system: Right common and internal carotid arteries patent without stenosis, dissection or occlusion. Left carotid system: Left common and internal carotid arteries patent without stenosis, dissection, or occlusion. Mild atheromatous change about the left carotid bulb without significant stenosis. Vertebral arteries: Both vertebral arteries arise from subclavian arteries. Left vertebral artery dominant. Vertebral arteries patent without stenosis, dissection, or occlusion. Short-segment fenestration at the right V2/V3 junction noted (series 7, image 168). Skeleton: No visible acute osseous finding. No discrete or worrisome osseous lesions. Other neck: No other acute soft tissue abnormality within the  neck. Few scattered thyroid nodules noted, largest of which measures 1.3 cm on the right. Findings felt to be of doubtful significance given size and patient age, no follow-up imaging recommended (ref: J Am Coll Radiol. 2015 Feb;12(2): 143-50).No other mass or adenopathy. Upper chest: Small layering left pleural effusion partially visualized. Visualized upper chest demonstrates no other acute findings. Review of the MIP images confirms the above findings CTA HEAD FINDINGS Anterior circulation: Horizontal petrous left ICA widely patent. Fairly severe atheromatous stenosis of up to 75% present at the or horizontal petrous right ICA (series 7, image 122). Scattered atheromatous change within the carotid siphons distally with associated mild to moderate diffuse narrowing. A1 segments patent bilaterally. Right A1 hypoplastic, accounting for the diminutive right ICA is compared to the left. Normal anterior communicating artery complex. Anterior cerebral arteries patent to their distal aspects without stenosis. No M1 stenosis or occlusion. Normal MCA bifurcations. Short-segment moderate proximal left M2 stenosis, superior division noted (series 11, image 19). Distal MCA branches well perfused and symmetric. Distal small vessel atheromatous irregularity. Posterior circulation: Both V4 segments patent to the vertebrobasilar junction without stenosis. Left vertebral artery dominant. Right PICA origin patent and normal. Left PICA not seen. Basilar patent to its distal aspect without stenosis. Superior cerebellar arteries patent bilaterally. Both PCAs primarily supplied via the basilar. Left PCA widely patent proximally. Focal severe distal left P2 stenosis noted (series 8, image 117). On the right, there is occlusion at the proximal right P2 segment (series 8, image 97). Scant attenuated flow distally within the right PCA distribution could be related to subocclusive thrombus and/or collateralization. Venous sinuses: Grossly  patent allowing for timing the contrast bolus. Anatomic variants: Hypoplastic right A1 segment.  No aneurysm. Review of the MIP images confirms the above findings IMPRESSION: 1. Acute occlusion at the proximal right P2 segment, likely embolic. Scant attenuated flow within the right PCA distribution distally could be related to subocclusive thrombus and/or collateralization. No embolic source seen elsewhere about the major arterial vasculature of the head and neck. 2. Severe 75% stenosis at the horizontal petrous right ICA. 3. Severe distal left P2 stenosis. 4. Additional scattered atheromatous change about the major arterial vasculature of the head and neck as above. No other proximal high-grade or correctable stenosis. 5. Small layering left pleural effusion, partially visualized. Electronically Signed   By: Rise Mu M.D.   On: 08/02/2020 22:52   MR BRAIN WO CONTRAST  Result Date: 08/02/2020 CLINICAL DATA:  Initial evaluation for acute stroke. EXAM: MRI HEAD WITHOUT CONTRAST TECHNIQUE: Multiplanar, multiecho pulse sequences of the brain and surrounding structures were obtained without intravenous contrast. COMPARISON:  Prior CT and CTA from earlier the same day. FINDINGS: Brain: Cerebral volume within normal limits for age. Patchy and confluent T2/FLAIR hyperintensity within the periventricular and deep white matter both cerebral hemispheres as well as the  pons, most consistent with chronic small vessel ischemic disease, moderate in nature. Few scattered superimposed remote lacunar infarcts noted about the basal ganglia and right thalamus. Patchy restricted diffusion involving the parasagittal right temporal occipital region consistent with a moderate-sized acute right PCA distribution infarct. Mild patchy involvement of the right thalamus and splenium. Minimal associated petechial hemorrhage without frank hemorrhagic transformation or significant mass effect. No other evidence for acute or subacute  ischemia. Gray-white matter differentiation otherwise maintained. No encephalomalacia to suggest chronic cortical infarction elsewhere within the brain. No other evidence for acute or chronic intracranial hemorrhage. No mass lesion, midline shift or mass effect. No hydrocephalus or extra-axial fluid collection. Pituitary gland suprasellar region normal. Midline structures intact. Vascular: Major intracranial vascular flow voids are maintained. Skull and upper cervical spine: Craniocervical junction within normal limits. Bone marrow signal intensity normal. No scalp soft tissue abnormality. Sinuses/Orbits: Patient status post bilateral ocular lens replacement. Right gaze noted. Scattered mucosal thickening noted throughout the paranasal sinuses. No mastoid effusion. Other: None. IMPRESSION: 1. Moderate-sized acute right PCA distribution infarct as above. Associated faint petechial hemorrhage without frank hemorrhagic transformation or significant mass effect. 2. Underlying moderate chronic microvascular ischemic disease with a few scattered remote lacunar infarcts about the basal ganglia and right thalamus. Electronically Signed   By: Rise Mu M.D.   On: 08/02/2020 23:03        Scheduled Meds:   stroke: mapping our early stages of recovery book   Does not apply Once   aspirin EC  81 mg Oral Daily   atorvastatin  80 mg Oral Daily   clopidogrel  75 mg Oral Daily   enoxaparin (LOVENOX) injection  40 mg Subcutaneous Q24H   insulin aspart  0-9 Units Subcutaneous TID PC,HS,0200   Continuous Infusions:  [START ON 08/04/2020] remdesivir 100 mg in NS 100 mL       LOS: 0 days       Huey Bienenstock, MD [facility]   To contact the attending provider between 7A-7P or the covering provider during after hours 7P-7A, please log into the web site [email or web address] and access using universal [place] password for that web site. If you do not have the password, please call the hospital  operator.  08/03/2020, 12:20 PM

## 2020-08-04 DIAGNOSIS — E1122 Type 2 diabetes mellitus with diabetic chronic kidney disease: Secondary | ICD-10-CM | POA: Diagnosis not present

## 2020-08-04 DIAGNOSIS — I63431 Cerebral infarction due to embolism of right posterior cerebral artery: Secondary | ICD-10-CM | POA: Diagnosis not present

## 2020-08-04 DIAGNOSIS — Z794 Long term (current) use of insulin: Secondary | ICD-10-CM

## 2020-08-04 DIAGNOSIS — N1831 Chronic kidney disease, stage 3a: Secondary | ICD-10-CM | POA: Diagnosis not present

## 2020-08-04 DIAGNOSIS — U071 COVID-19: Secondary | ICD-10-CM | POA: Diagnosis not present

## 2020-08-04 LAB — GLUCOSE, CAPILLARY
Glucose-Capillary: 122 mg/dL — ABNORMAL HIGH (ref 70–99)
Glucose-Capillary: 131 mg/dL — ABNORMAL HIGH (ref 70–99)
Glucose-Capillary: 149 mg/dL — ABNORMAL HIGH (ref 70–99)
Glucose-Capillary: 132 mg/dL — ABNORMAL HIGH (ref 70–99)
Glucose-Capillary: 151 mg/dL — ABNORMAL HIGH (ref 70–99)

## 2020-08-04 LAB — URINALYSIS, ROUTINE W REFLEX MICROSCOPIC
Bilirubin Urine: NEGATIVE
Glucose, UA: NEGATIVE mg/dL
Hgb urine dipstick: NEGATIVE
Ketones, ur: 5 mg/dL — AB
Nitrite: NEGATIVE
Protein, ur: 30 mg/dL — AB
Specific Gravity, Urine: 1.013 (ref 1.005–1.030)
pH: 5 (ref 5.0–8.0)

## 2020-08-04 LAB — C-REACTIVE PROTEIN: CRP: 0.8 mg/dL (ref <1.0)

## 2020-08-04 LAB — BASIC METABOLIC PANEL
Anion gap: 6 (ref 5–15)
BUN: 17 mg/dL (ref 8–23)
CO2: 27 mmol/L (ref 22–32)
Calcium: 9.2 mg/dL (ref 8.9–10.3)
Chloride: 105 mmol/L (ref 98–111)
Creatinine, Ser: 1.33 mg/dL — ABNORMAL HIGH (ref 0.44–1.00)
GFR, Estimated: 41 mL/min — ABNORMAL LOW (ref >60)
Glucose, Bld: 115 mg/dL — ABNORMAL HIGH (ref 70–99)
Potassium: 3.3 mmol/L — ABNORMAL LOW (ref 3.5–5.1)
Sodium: 138 mmol/L (ref 135–145)

## 2020-08-04 LAB — RAPID URINE DRUG SCREEN, HOSP PERFORMED
Amphetamines: NOT DETECTED
Barbiturates: NOT DETECTED
Benzodiazepines: POSITIVE — AB
Cocaine: NOT DETECTED
Opiates: NOT DETECTED
Tetrahydrocannabinol: NOT DETECTED

## 2020-08-04 LAB — CBC
HCT: 40 % (ref 36.0–46.0)
Hemoglobin: 13.3 g/dL (ref 12.0–15.0)
MCH: 30.9 pg (ref 26.0–34.0)
MCHC: 33.3 g/dL (ref 30.0–36.0)
MCV: 93 fL (ref 80.0–100.0)
Platelets: 193 10*3/uL (ref 150–400)
RBC: 4.3 MIL/uL (ref 3.87–5.11)
RDW: 14.2 % (ref 11.5–15.5)
WBC: 6.5 10*3/uL (ref 4.0–10.5)
nRBC: 0 % (ref 0.0–0.2)

## 2020-08-04 MED ORDER — POTASSIUM CHLORIDE CRYS ER 20 MEQ PO TBCR
40.0000 meq | EXTENDED_RELEASE_TABLET | Freq: Four times a day (QID) | ORAL | Status: AC
  Administered 2020-08-04 (×2): 40 meq via ORAL
  Filled 2020-08-04 (×2): qty 2

## 2020-08-04 NOTE — Evaluation (Signed)
Occupational Therapy Evaluation Patient Details Name: Yolanda Saunders MRN: [id number] DOB: 1945/05/25 Today's Date: 08/04/2020    History of Present Illness 75yo female admitted 08/02/20 with increased confusion, speech changes, and visual changes. Imaging shows large R PCA CVA. Incidentally Covid +. PMH CKD, CVA, fibromyalgia, HTN, IBS, panic attacks, CVA, DM, lumbar surgery 5/22   Clinical Impression   Patient admitted for the above diagnosis.  Recent L4-5 discectomy in May of 2022, and as of that eval she was able to walk with supervision and could perform her ADL from a sit to stand level.  She lives with her spouse, who is able to assist as needed.  Patient states after surgery R hip and back pain increased, and she was needing increased assist with mobility and lower body ADL from her husband.  Barriers are listed below, and she was scheduled for further decompressive surgery in the near future.  Currently she is limited by pain, but is hopeful to return home.  She is needing up to [facility] for mobility, and scored Max A for lower body ADL due to pain.  HH OT can be considered to reinforce back precautions, and revisit adaptive equipment to increase independence.      Follow Up Recommendations  Home health OT;Supervision/Assistance - 24 hour    Equipment Recommendations  None recommended by OT    Recommendations for Other Services       Precautions / Restrictions Precautions Precautions: Fall;Back Precaution Comments: Recent back surgery. Restrictions Weight Bearing Restrictions: No      Mobility Bed Mobility Overal bed mobility: Needs Assistance Bed Mobility: Rolling;Sidelying to Sit;Sit to Sidelying Rolling: Modified independent (Device/Increase time) Sidelying to sit: Min guard;HOB elevated     Sit to sidelying: Min guard;HOB elevated      Transfers Overall transfer level: Needs assistance Equipment used: Rolling walker (2 wheeled) Transfers: Sit to/from  [employer] Sit to Stand: Min guard Stand pivot transfers: Min guard       General transfer comment: patient declined recliner, but agreed to demonstrate transfer.    Balance Overall balance assessment: Needs assistance Sitting-balance support: No upper extremity supported;Feet supported Sitting balance-Leahy Scale: Good     Standing balance support: Bilateral upper extremity supported Standing balance-Leahy Scale: Fair                             ADL either performed or assessed with clinical judgement   ADL Overall ADL's : Needs assistance/impaired                 Upper Body Dressing : Min guard;Sitting   Lower Body Dressing: Maximal assistance;Sitting/lateral leans Lower Body Dressing Details (indicate cue type and reason): declined to attempt Toilet Transfer: Min guard;BSC;RW           Functional mobility during ADLs: Min guard;Rolling walker       Vision   Vision Assessment?: Yes Saccades: Within functional limits Visual Fields: Left homonymous hemianopsia     Perception     Praxis      Pertinent Vitals/Pain Pain Assessment: Faces Faces Pain Scale: Hurts even more Pain Location: R hip pain in weight bearing and mobility Pain Descriptors / Indicators: Grimacing;Guarding Pain Intervention(s): Monitored during session;Limited activity within patient's tolerance     Hand Dominance     Extremity/Trunk Assessment Upper Extremity Assessment Upper Extremity Assessment: Generalized weakness   Lower Extremity Assessment Lower Extremity Assessment: Defer to PT evaluation  Cervical / Trunk Assessment Cervical / Trunk Assessment: Kyphotic (recent L4-5 discectomy 5/13)   Communication Communication Communication: No difficulties;Other (comment) (mild word finding and slow speech)   Cognition Arousal/Alertness: Awake/alert Behavior During Therapy: Flat affect Overall Cognitive Status: Impaired/Different from baseline                            Safety/Judgement: Decreased awareness of safety   Problem Solving: Slow processing;Decreased initiation;Difficulty sequencing;Requires tactile cues;Requires verbal cues     General Comments       Exercises     Shoulder Instructions      Home Living Family/patient expects to be discharged to:: Private residence Living Arrangements: Spouse/significant other Available Help at Discharge: Family;Available 24 hours/day Type of Home: House Home Access: Stairs to enter [employer] of Steps: 6 Entrance Stairs-Rails: Right;Left;Can reach both Home Layout: One level     Bathroom Shower/Tub: Producer, television/film/video: Standard     Home Equipment: Shower seat;Hand held Careers information officer - 4 wheels;Walker - 2 wheels          Prior Functioning/Environment Level of Independence: Needs assistance  Gait / Transfers Assistance Needed: using rollator intermittently ADL's / Homemaking Assistance Needed: Husband has had to A her more over the last 5 months due to pain in RLE and back for LB ADL.  Also assisting with community mobility, meal prep and home management.            OT Problem List: Decreased activity tolerance;Impaired balance (sitting and/or standing);Impaired vision/perception;Decreased safety awareness;Pain      OT Treatment/Interventions: Self-care/ADL training;Balance training;Patient/family education;Visual/perceptual remediation/compensation;Therapeutic activities    OT Goals(Current goals can be found in the care plan section) Acute Rehab OT Goals Patient Stated Goal: Return home and recover OT Goal Formulation: With patient Time For Goal Achievement: 08/18/20 Potential to Achieve Goals: Fair ADL Goals Pt Will Perform Grooming: with modified independence;standing;sitting Pt Will Perform Upper Body Bathing: with modified independence;sitting;standing Pt Will Perform Upper Body Dressing: with modified  independence;sitting;standing Pt Will Perform Lower Body Dressing: sit to/from stand;with adaptive equipment;with supervision Pt Will Transfer to Toilet: with modified independence;stand pivot transfer;ambulating;regular height toilet;bedside commode  OT Frequency: Min 2X/week   Barriers to D/C:    none noted       Co-evaluation              AM-PAC OT "6 Clicks" Daily Activity     Outcome Measure Help from another person eating meals?: None Help from another person taking care of personal grooming?: None Help from another person toileting, which includes using toliet, bedpan, or urinal?: A Little Help from another person bathing (including washing, rinsing, drying)?: A Lot Help from another person to put on and taking off regular upper body clothing?: A Little Help from another person to put on and taking off regular lower body clothing?: A Lot 6 Click Score: 18   End of Session Equipment Utilized During Treatment: Rolling walker  Activity Tolerance: Patient limited by pain Patient left: in bed;with call bell/phone within reach;with bed alarm set  OT Visit Diagnosis: Unsteadiness on feet (R26.81);Pain;History of falling (Z91.81);Muscle weakness (generalized) (M62.81) Pain - Right/Left: Right                Time: 7353-2992 OT Time Calculation (min): 17 min Charges:  OT General Charges $OT Visit: 1 Visit OT Evaluation $OT Eval Moderate Complexity: 1 Mod  08/04/2020  Rich, OTR/L  Acute Rehabilitation Services  Office:  [phone]   Suzanna Obey 08/04/2020, 4:51 PM

## 2020-08-04 NOTE — Consult Note (Signed)
Cardiology Consultation:   Patient ID: Yolanda Saunders MRN: [id number]; DOB: 1945/12/10  Admit date: 08/02/2020 Date of Consult: 08/04/2020  PCP:  Shirline Frees, MD   [facility] Providers Cardiologist:  Vickie Epley    Patient Profile:   Yolanda Saunders is a 75 y.o. female with a hx of HTN, IBS, CVA, DM, CKD3a, asthma who is being seen 08/04/2020 for the evaluation of newly reduced EF at the request of Dr Erlinda Hong.  History of Present Illness:   Yolanda Saunders presented to [facility] with confusion x 2 days and was diagnosed with a stroke (large right PCA infarct embolic). As part of the evaluation she had an echocardiogram which showed a reduced EF with segmental LV wall motion abnormalities.  Today she tells me that she has not had any chest pain. No syncope.  Her husband is at the bedside.   Her son recently passed away and the funeral was yesterday.   Past Medical History:  Diagnosis Date   Anemia 04/17/11   "long, long years ago"   Angina 04/16/11   "that's what I'm here for"   Anxiety    Arthritis    Hips and knees   Asthma    Carpal tunnel syndrome    Cataracts, bilateral    CKD (chronic kidney disease), stage III ([place])    Complication of anesthesia 1987   "affected my eyes; couldn't see anything but blurrs even the next day"   CVA (cerebral infarction)    After cardiac catheter 02/2000   Depression    Edema    Fibromyalgia    GERD (gastroesophageal reflux disease)    Headache(784.0)    High cholesterol    History of bronchitis    Hypertension    IBS (irritable bowel syndrome)    Migraines    Panic attacks 04/17/11   "don't take anything for it"   Pneumonia 04/17/11   "probably as many as 7 times"   Renal disorder 04/17/11   "they are working at 60% capacity"   Shortness of breath on exertion    "cause of my asthma"   Stroke ([place]) 2002   residual "problem w/using the right word, left 5 lesions on my brain/MRI; long term memory loss"   Type II  diabetes mellitus (Yolanda Saunders)     Past Surgical History:  Procedure Laterality Date   CARDIAC CATHETERIZATION  2002   CARPAL TUNNEL RELEASE  2003-2010   "twice on left; once on right"   [place] Bilateral 11/2019   Lenses Implant   histerectomy     LUMBAR LAMINECTOMY/ DECOMPRESSION WITH MET-RX Right 06/01/2020   Procedure: Right Lumbar Four-Five Minimally invasive discectomy;  Surgeon: Judith Part, MD;  Location: [place];  Service: Neurosurgery;  Laterality: Right;   VAGINAL HYSTERECTOMY  1977     Home Medications:  Prior to Admission medications   Medication Sig Start Date End Date Taking? Authorizing Provider  aspirin EC 81 MG tablet Take 81 mg by mouth daily.   Yes [provider]  atorvastatin (LIPITOR) 40 MG tablet Take 40 mg by mouth every Monday, Wednesday, and Friday. 01/17/16  Yes [provider]  Blood Glucose Monitoring Suppl (ONETOUCH VERIO) w/Device KIT 1 each by Does not apply route daily. 11/14/15  Yes Philemon Kingdom, MD  Cholecalciferol (VITAMIN D3) 125 MCG (5000 UT) TABS Take 5,000 Units by mouth daily.   Yes [provider]  furosemide (  LASIX) 40 MG tablet Take 40 mg by mouth daily.   Yes [provider]  glucose blood (ONETOUCH VERIO) test strip USE 1 STRIP TO CHECK GLUCOSE 4x TIMES DAILY 05/22/20  Yes Philemon Kingdom, MD  hydrochlorothiazide (HYDRODIURIL) 25 MG tablet Take 25 mg by mouth 3 (three) times daily as needed (high blood pressure). Tid prn high blood pressure   Yes [provider]  insulin glargine (LANTUS) 100 UNIT/ML injection Inject 0.3 mLs (30 Units total) into the skin daily. Patient taking differently: Inject 30-34 Units into the skin daily. Sliding scale 05/22/20  Yes Philemon Kingdom, MD  insulin regular (NOVOLIN R) 100 units/mL injection Inject 30-34 Units into the skin 3 (three) times daily before meals. Sliding scale   Yes [provider]  Insulin Syringe-Needle U-100 (RELION INSULIN SYR 0.5ML/31G) 31G X 5/16" 0.5 ML MISC USE 4x TIMES DAILY 05/22/20  Yes Philemon Kingdom, MD  Syringe/Needle, Disp, ([place]) 23G X 1" 3 ML MISC 1 Package by Does not apply route as needed. 07/21/14  Yes Patel, Donika K, DO  vitamin B-12 (CYANOCOBALAMIN) 1000 MCG tablet Take 1,000 mcg by mouth daily.   Yes [provider]  cefdinir (OMNICEF) 300 MG capsule Take 1 capsule (300 mg total) by mouth 2 (two) times daily. Patient not taking: No sig reported 03/15/20   Breck Coons, MD    Inpatient Medications: Scheduled Meds:  aspirin EC  81 mg Oral Daily   atorvastatin  80 mg Oral Daily   clopidogrel  75 mg Oral Daily   enoxaparin (LOVENOX) injection  40 mg Subcutaneous Q24H   insulin aspart  0-9 Units Subcutaneous TID PC,HS,0200   potassium chloride  40 mEq Oral Q6H   Continuous Infusions:  remdesivir 100 mg in NS 100 mL 100 mg (08/04/20 0844)   PRN Meds: acetaminophen **OR** acetaminophen (TYLENOL) oral liquid 160 mg/5 mL **OR** acetaminophen, senna-docusate  Allergies:    Allergies  Allergen Reactions   Codeine Other (See Comments)    "makes me crazy; see things; delusional"   Erythromycin Other (See Comments)    "peeled skin; like a sunburn & I turn the color of the pill; a kind of purple-look"   Lisinopril Cough    "cause I have asthma"   Penicillins Rash and Other (See Comments)    "puffy blisters   Shellfish Allergy     Throat swells   Sulfonamide Derivatives Hives    "watery hives"   Metoprolol Diarrhea and Nausea And Vomiting    Rapid weight gain.   Actos [Pioglitazone]     Upset GI   Amlodipine     Syncope   Benicar [Olmesartan] Other (See Comments)    Dizziness and HA   Byetta 10 Mcg Pen [Exenatide] Nausea And Vomiting    5 MCG pen   Gabapentin Other (See Comments)    Anxiety    Glimepiride     Upset GI   Glipizide     Upset GI   Glyburide-Metformin     Myalgias   Humalog [Insulin  Lispro]     Headache, SEVERE HYPERGLYCEMIA   Invokana [Canagliflozin]     Yeast infections   Januvia [Sitagliptin]     uti   Other     Other reaction(s): Unknown   Reglan [Metoclopramide]     unknown   Septra [Sulfamethoxazole-Trimethoprim] Hives   Spironolactone     unk   Tramadol Other (See Comments)    anxiety   Victoza [Liraglutide] Diarrhea  Abdominal pain    Zocor [Simvastatin]     unknown   Losartan Potassium Rash    Upset GI   Novolin R [Insulin] Swelling and Rash    70/30    Social History:   Social History   Socioeconomic History   Marital status: Married    Spouse name: Not on file   Number of children: Not on file   Years of education: Not on file   Highest education level: Not on file  Occupational History   Not on file  Tobacco Use   Smoking status: Former    Packs/day: 0.75    Years: 6.00    Pack years: 4.50    Types: Cigarettes    Quit date: 01/21/1980    Years since quitting: 40.5   Smokeless tobacco: Never  Substance and Sexual Activity   Alcohol use: No    Alcohol/week: 0.0 standard drinks   Drug use: No   Sexual activity: Not Currently  Other Topics Concern   Not on file  Social History Narrative   Lives with husband in a one story home.  Has 2 children.  Retired from [employer].  Education: 12th grade.  Trade schools.    Social Determinants of Health   Financial Resource Strain: Not on file  Food Insecurity: Not on file  Transportation Needs: Not on file  Physical Activity: Not on file  Stress: Not on file  Social Connections: Not on file  Intimate Partner Violence: Not on file    Family History:    Family History  Problem Relation Age of Onset   Coronary artery disease Father    Diabetes Mellitus I Father    CVA Mother    Stroke Mother    Diabetes Mellitus I Mother    Diabetes Mellitus I Sister    Diabetes Mellitus I Brother    Diabetes Mellitus I Maternal Grandmother    Diabetes Mellitus I Maternal Grandfather     Diabetes Mellitus I Paternal Grandmother    Diabetes Mellitus I Paternal Grandfather    Diabetes Mellitus I Brother    Aortic aneurysm Son      ROS:  Please see the history of present illness.   All other ROS reviewed and negative.     Physical Exam/Data:   Vitals:   08/03/20 2000 08/04/20 0000 08/04/20 0400 08/04/20 0758  BP: (!) 145/66 (!) 153/71 (!) 143/75 123/63  Pulse: 85 77 78 78  Resp: _0 Temp: 97.7 F (36.5 C) 98.4 F (36.9 C) 98.3 F (36.8 C) 98.1 F (36.7 C)  TempSrc: Oral Oral Oral Oral  SpO2: 97% 93% 95% 96%  Weight:      Height:        Intake/Output Summary (Last 24 hours) at 08/04/2020 1142 Last data filed at 08/04/2020 0400 Gross per 24 hour  Intake 440 ml  Output --  Net 440 ml   Last 3 Weights 08/03/2020 06/01/2020 05/30/2020  Weight (lbs) 183 lb 3.2 oz 196 lb 196 lb  Weight (kg) 83.1 kg 88.905 kg 88.905 kg     Body mass index is 32.45 kg/m.  General:  Well nourished, well developed, in no acute distress HEENT: normal Lymph: no adenopathy Neck: no JVD Endocrine:  No thryomegaly Vascular: No carotid bruits; FA pulses 2+ bilaterally without bruits  Cardiac:  normal S1, S2; RRR; no murmur  Lungs:  clear to auscultation bilaterally, no wheezing, rhonchi or rales  Abd: soft, nontender, no hepatomegaly  Ext: no  edema Musculoskeletal:  No deformities, BUE and BLE strength normal and equal Skin: warm and dry  Neuro:  CNs 2-12 intact, no focal abnormalities noted Psych:  Normal affect   EKG:  The EKG was personally reviewed and demonstrates:  sinus rhythm. Poor R wave progression. Left axis.  Telemetry:  Telemetry was personally reviewed and demonstrates:  sinus  Relevant CV Studies:  08/03/2020 Echo personally reviewed [id number]% Anterior/anteroseptal wall motion abnormality RV normal Trivial MR  Laboratory Data:  High Sensitivity Troponin:  No results for input(s): TROPONINIHS in the last 720 hours.   Chemistry Recent Labs  Lab  08/02/20 1616 08/02/20 1654 08/04/20 0022  NA 136 138 138  K 3.8 3.8 3.3*  CL 102 104 105  CO2 23  --  27  GLUCOSE 141* 143* 115*  BUN _0 CREATININE 1.22* 1.20* 1.33*  CALCIUM 9.6  --  9.2  GFRNONAA 46*  --  41*  ANIONGAP 11  --  6    Recent Labs  Lab 08/02/20 1616  PROT 6.5  ALBUMIN 3.6  AST 15  ALT 16  ALKPHOS 57  BILITOT 0.9   Hematology Recent Labs  Lab 08/02/20 1616 08/02/20 1654 08/04/20 0022  WBC 7.1  --  6.5  RBC 4.66  --  4.30  HGB 14.4 14.6 13.3  HCT 43.9 43.0 40.0  MCV 94.2  --  93.0  MCH 30.9  --  30.9  MCHC 32.8  --  33.3  RDW 14.0  --  14.2  PLT 213  --  193   BNPNo results for input(s): BNP, PROBNP in the last 168 hours.  DDimer No results for input(s): DDIMER in the last 168 hours.   Radiology/Studies:  CT ANGIO HEAD W OR WO CONTRAST  Result Date: 08/02/2020 CLINICAL DATA:  Initial evaluation for neuro deficit, stroke. EXAM: CT ANGIOGRAPHY HEAD AND NECK TECHNIQUE: Multidetector CT imaging of the head and neck was performed using the standard protocol during bolus administration of intravenous contrast. Multiplanar CT image reconstructions and MIPs were obtained to evaluate the vascular anatomy. Carotid stenosis measurements (when applicable) are obtained utilizing NASCET criteria, using the distal internal carotid diameter as the denominator. CONTRAST:  71m OMNIPAQUE IOHEXOL 350 MG/ML SOLN COMPARISON:  Prior CT from earlier the same day. FINDINGS: CTA NECK FINDINGS Aortic arch: Visualized aortic arch normal in caliber with normal 3 vessel morphology. Mild atheromatous change about the arch and origin of the great vessels without hemodynamically significant stenosis. Right carotid system: Right common and internal carotid arteries patent without stenosis, dissection or occlusion. Left carotid system: Left common and internal carotid arteries patent without stenosis, dissection, or occlusion. Mild atheromatous change about the left carotid bulb  without significant stenosis. Vertebral arteries: Both vertebral arteries arise from subclavian arteries. Left vertebral artery dominant. Vertebral arteries patent without stenosis, dissection, or occlusion. Short-segment fenestration at the right V2/V3 junction noted (series 7, image 168). Skeleton: No visible acute osseous finding. No discrete or worrisome osseous lesions. Other neck: No other acute soft tissue abnormality within the neck. Few scattered thyroid nodules noted, largest of which measures 1.3 cm on the right. Findings felt to be of doubtful significance given size and patient age, no follow-up imaging recommended (ref: J Am Coll Radiol. 2015 Feb;12(2): 143-50).No other mass or adenopathy. Upper chest: Small layering left pleural effusion partially visualized. Visualized upper chest demonstrates no other acute findings. Review of the MIP images confirms the above findings CTA HEAD FINDINGS Anterior circulation: Horizontal petrous left  ICA widely patent. Fairly severe atheromatous stenosis of up to 75% present at the or horizontal petrous right ICA (series 7, image 122). Scattered atheromatous change within the carotid siphons distally with associated mild to moderate diffuse narrowing. A1 segments patent bilaterally. Right A1 hypoplastic, accounting for the diminutive right ICA is compared to the left. Normal anterior communicating artery complex. Anterior cerebral arteries patent to their distal aspects without stenosis. No M1 stenosis or occlusion. Normal MCA bifurcations. Short-segment moderate proximal left M2 stenosis, superior division noted (series 11, image 19). Distal MCA branches well perfused and symmetric. Distal small vessel atheromatous irregularity. Posterior circulation: Both V4 segments patent to the vertebrobasilar junction without stenosis. Left vertebral artery dominant. Right PICA origin patent and normal. Left PICA not seen. Basilar patent to its distal aspect without stenosis.  Superior cerebellar arteries patent bilaterally. Both PCAs primarily supplied via the basilar. Left PCA widely patent proximally. Focal severe distal left P2 stenosis noted (series 8, image 117). On the right, there is occlusion at the proximal right P2 segment (series 8, image 97). Scant attenuated flow distally within the right PCA distribution could be related to subocclusive thrombus and/or collateralization. Venous sinuses: Grossly patent allowing for timing the contrast bolus. Anatomic variants: Hypoplastic right A1 segment.  No aneurysm. Review of the MIP images confirms the above findings IMPRESSION: 1. Acute occlusion at the proximal right P2 segment, likely embolic. Scant attenuated flow within the right PCA distribution distally could be related to subocclusive thrombus and/or collateralization. No embolic source seen elsewhere about the major arterial vasculature of the head and neck. 2. Severe 75% stenosis at the horizontal petrous right ICA. 3. Severe distal left P2 stenosis. 4. Additional scattered atheromatous change about the major arterial vasculature of the head and neck as above. No other proximal high-grade or correctable stenosis. 5. Small layering left pleural effusion, partially visualized. Electronically Signed   By: Jeannine Boga M.D.   On: 08/02/2020 22:52   CT HEAD WO CONTRAST  Result Date: 08/02/2020 CLINICAL DATA:  Mental status change, unknown cause. Additional history provided: Patient reports "not feeling like herself." Patient reports vision "isn't up to par." Patient reports dizziness and confusion. EXAM: CT HEAD WITHOUT CONTRAST TECHNIQUE: Contiguous axial images were obtained from the base of the skull through the vertex without intravenous contrast. COMPARISON:  Head CT 11/07/2016.  Brain MRI 07/18/2013. FINDINGS: Brain: Mild generalized cerebral and cerebellar atrophy. Abnormal cortical/subcortical hypodensity within the right temporal and occipital lobes measuring  8.9 x 2.6 x 1.4 cm (AP x TV x CC) compatible with acute right PCA territory infarction. Acute infarction changes are also present within the callosal splenium on the right. Chronic lacunar infarct within the left lentiform nucleus not definitively present on the prior head CT of 11/07/2016. Background mild patchy and ill-defined hypoattenuation within the cerebral white matter, nonspecific but compatible chronic small vessel ischemic disease. Redemonstrated nonspecific linear calcifications within the inferior pons (series 3, image 26). There is no acute intracranial hemorrhage. No extra-axial fluid collection. No evidence of an intracranial mass. No midline shift. Vascular: No hyperdense vessel.  Atherosclerotic calcifications. Skull: Normal. Negative for fracture or focal lesion. Sinuses/Orbits: Visualized orbits show no acute finding. Mild bilateral frontal and ethmoid sinus mucosal thickening. These results were called by telephone at the time of interpretation on 08/02/2020 at 5:31 pm to provider PA [facility], who verbally acknowledged these results. IMPRESSION: Large acute cortical/subcortical right PCA territory infarct within the right temporal and occipital lobes, as well as callosal splenium.  No significant mass effect at this time. No evidence of hemorrhagic conversion. Chronic lacunar infarct within the left basal ganglia, not definitively present on the prior head CT of 11/07/2016. Background mild generalized parenchymal atrophy and mild cerebral white matter chronic small vessel ischemic disease. Electronically Signed   By: Kellie Simmering DO   On: 08/02/2020 17:33   CT ANGIO NECK W OR WO CONTRAST  Result Date: 08/02/2020 CLINICAL DATA:  Initial evaluation for neuro deficit, stroke. EXAM: CT ANGIOGRAPHY HEAD AND NECK TECHNIQUE: Multidetector CT imaging of the head and neck was performed using the standard protocol during bolus administration of intravenous contrast. Multiplanar CT image  reconstructions and MIPs were obtained to evaluate the vascular anatomy. Carotid stenosis measurements (when applicable) are obtained utilizing NASCET criteria, using the distal internal carotid diameter as the denominator. CONTRAST:  61m OMNIPAQUE IOHEXOL 350 MG/ML SOLN COMPARISON:  Prior CT from earlier the same day. FINDINGS: CTA NECK FINDINGS Aortic arch: Visualized aortic arch normal in caliber with normal 3 vessel morphology. Mild atheromatous change about the arch and origin of the great vessels without hemodynamically significant stenosis. Right carotid system: Right common and internal carotid arteries patent without stenosis, dissection or occlusion. Left carotid system: Left common and internal carotid arteries patent without stenosis, dissection, or occlusion. Mild atheromatous change about the left carotid bulb without significant stenosis. Vertebral arteries: Both vertebral arteries arise from subclavian arteries. Left vertebral artery dominant. Vertebral arteries patent without stenosis, dissection, or occlusion. Short-segment fenestration at the right V2/V3 junction noted (series 7, image 168). Skeleton: No visible acute osseous finding. No discrete or worrisome osseous lesions. Other neck: No other acute soft tissue abnormality within the neck. Few scattered thyroid nodules noted, largest of which measures 1.3 cm on the right. Findings felt to be of doubtful significance given size and patient age, no follow-up imaging recommended (ref: J Am Coll Radiol. 2015 Feb;12(2): 143-50).No other mass or adenopathy. Upper chest: Small layering left pleural effusion partially visualized. Visualized upper chest demonstrates no other acute findings. Review of the MIP images confirms the above findings CTA HEAD FINDINGS Anterior circulation: Horizontal petrous left ICA widely patent. Fairly severe atheromatous stenosis of up to 75% present at the or horizontal petrous right ICA (series 7, image 122). Scattered  atheromatous change within the carotid siphons distally with associated mild to moderate diffuse narrowing. A1 segments patent bilaterally. Right A1 hypoplastic, accounting for the diminutive right ICA is compared to the left. Normal anterior communicating artery complex. Anterior cerebral arteries patent to their distal aspects without stenosis. No M1 stenosis or occlusion. Normal MCA bifurcations. Short-segment moderate proximal left M2 stenosis, superior division noted (series 11, image 19). Distal MCA branches well perfused and symmetric. Distal small vessel atheromatous irregularity. Posterior circulation: Both V4 segments patent to the vertebrobasilar junction without stenosis. Left vertebral artery dominant. Right PICA origin patent and normal. Left PICA not seen. Basilar patent to its distal aspect without stenosis. Superior cerebellar arteries patent bilaterally. Both PCAs primarily supplied via the basilar. Left PCA widely patent proximally. Focal severe distal left P2 stenosis noted (series 8, image 117). On the right, there is occlusion at the proximal right P2 segment (series 8, image 97). Scant attenuated flow distally within the right PCA distribution could be related to subocclusive thrombus and/or collateralization. Venous sinuses: Grossly patent allowing for timing the contrast bolus. Anatomic variants: Hypoplastic right A1 segment.  No aneurysm. Review of the MIP images confirms the above findings IMPRESSION: 1. Acute occlusion at the proximal right  P2 segment, likely embolic. Scant attenuated flow within the right PCA distribution distally could be related to subocclusive thrombus and/or collateralization. No embolic source seen elsewhere about the major arterial vasculature of the head and neck. 2. Severe 75% stenosis at the horizontal petrous right ICA. 3. Severe distal left P2 stenosis. 4. Additional scattered atheromatous change about the major arterial vasculature of the head and neck as  above. No other proximal high-grade or correctable stenosis. 5. Small layering left pleural effusion, partially visualized. Electronically Signed   By: Jeannine Boga M.D.   On: 08/02/2020 22:52   MR BRAIN WO CONTRAST  Result Date: 08/02/2020 CLINICAL DATA:  Initial evaluation for acute stroke. EXAM: MRI HEAD WITHOUT CONTRAST TECHNIQUE: Multiplanar, multiecho pulse sequences of the brain and surrounding structures were obtained without intravenous contrast. COMPARISON:  Prior CT and CTA from earlier the same day. FINDINGS: Brain: Cerebral volume within normal limits for age. Patchy and confluent T2/FLAIR hyperintensity within the periventricular and deep white matter both cerebral hemispheres as well as the pons, most consistent with chronic small vessel ischemic disease, moderate in nature. Few scattered superimposed remote lacunar infarcts noted about the basal ganglia and right thalamus. Patchy restricted diffusion involving the parasagittal right temporal occipital region consistent with a moderate-sized acute right PCA distribution infarct. Mild patchy involvement of the right thalamus and splenium. Minimal associated petechial hemorrhage without frank hemorrhagic transformation or significant mass effect. No other evidence for acute or subacute ischemia. Gray-white matter differentiation otherwise maintained. No encephalomalacia to suggest chronic cortical infarction elsewhere within the brain. No other evidence for acute or chronic intracranial hemorrhage. No mass lesion, midline shift or mass effect. No hydrocephalus or extra-axial fluid collection. Pituitary gland suprasellar region normal. Midline structures intact. Vascular: Major intracranial vascular flow voids are maintained. Skull and upper cervical spine: Craniocervical junction within normal limits. Bone marrow signal intensity normal. No scalp soft tissue abnormality. Sinuses/Orbits: Patient status post bilateral ocular lens replacement.  Right gaze noted. Scattered mucosal thickening noted throughout the paranasal sinuses. No mastoid effusion. Other: None. IMPRESSION: 1. Moderate-sized acute right PCA distribution infarct as above. Associated faint petechial hemorrhage without frank hemorrhagic transformation or significant mass effect. 2. Underlying moderate chronic microvascular ischemic disease with a few scattered remote lacunar infarcts about the basal ganglia and right thalamus. Electronically Signed   By: Jeannine Boga M.D.   On: 08/02/2020 23:03   DG Chest Port 1 View  Result Date: 08/03/2020 CLINICAL DATA:  COVID. EXAM: PORTABLE CHEST 1 VIEW COMPARISON:  Chest x-ray dated April 16, 2011. FINDINGS: The heart size and mediastinal contours are within normal limits. Both lungs are clear. The visualized skeletal structures are unremarkable. IMPRESSION: No active disease. Electronically Signed   By: Titus Dubin M.D.   On: 08/03/2020 14:48   ECHOCARDIOGRAM COMPLETE  Result Date: 08/03/2020    ECHOCARDIOGRAM REPORT   Patient Name:   JENESE MISCHKE Date of Exam: 08/03/2020 Medical Rec #:  [id number]         Height:       63.0 in Accession #:    [id number]        Weight:       183.2 lb Date of Birth:  10-09-1945         BSA:          1.863 m Patient Age:    27 years          BP:           138/92 mmHg Patient  Gender: F                 HR:           71 bpm. Exam Location:  Inpatient Procedure: 2D Echo, Cardiac Doppler and Color Doppler Indications:    Stroke  History:        Patient has prior history of Echocardiogram examinations, most                 recent 10/27/2016. Stroke; Risk Factors:Former Smoker, Diabetes,                 Dyslipidemia and Hypertension.  Sonographer:    Cammy Brochure Referring Phys: [id number] [place] T TU IMPRESSIONS  1. No left ventricular thrombus is seen with Definity contrast. Left ventricular ejection fraction, by estimation, is 35 to 40%. The left ventricle has moderately decreased function. The left  ventricle has no regional wall motion abnormalities. Left ventricular diastolic parameters are consistent with Grade I diastolic dysfunction (impaired relaxation). There is severe hypokinesis of the left ventricular, mid-apical anteroseptal wall and anterior wall. There is mild dyskinesis of the left ventricular, entire apical segment.  2. Right ventricular systolic function is normal. The right ventricular size is normal. Tricuspid regurgitation signal is inadequate for assessing PA pressure.  3. The mitral valve is normal in structure. Trivial mitral valve regurgitation. No evidence of mitral stenosis.  4. The aortic valve is normal in structure. Aortic valve regurgitation is mild. No aortic stenosis is present.  5. The inferior vena cava is normal in size with greater than 50% respiratory variability, suggesting right atrial pressure of 3 mmHg. Comparison(s): Prior images unable to be directly viewed, comparison made by report only. Changes from prior study are noted. The left ventricular function is significantly worse. The left ventricular wall motion abnormalities are new. FINDINGS  Left Ventricle: No left ventricular thrombus is seen with Definity contrast. Left ventricular ejection fraction, by estimation, is 35 to 40%. The left ventricle has moderately decreased function. The left ventricle has no regional wall motion abnormalities. Severe hypokinesis of the left ventricular, mid-apical anteroseptal wall and anterior wall. Mild dyskinesis of the left ventricular, entire apical segment. The left ventricular internal cavity size was normal in size. There is no left ventricular hypertrophy. Left ventricular diastolic parameters are consistent with Grade I diastolic dysfunction (impaired relaxation).  LV Wall Scoring: The apical lateral segment, apical septal segment, apical inferior segment, and apex are dyskinetic. The mid and distal anterior wall and mid anteroseptal segment are hypokinetic. Right Ventricle:  The right ventricular size is normal. No increase in right ventricular wall thickness. Right ventricular systolic function is normal. Tricuspid regurgitation signal is inadequate for assessing PA pressure. Left Atrium: Left atrial size was normal in size. Right Atrium: Right atrial size was normal in size. Pericardium: There is no evidence of pericardial effusion. Mitral Valve: The mitral valve is normal in structure. Trivial mitral valve regurgitation. No evidence of mitral valve stenosis. Tricuspid Valve: The tricuspid valve is normal in structure. Tricuspid valve regurgitation is not demonstrated. No evidence of tricuspid stenosis. Aortic Valve: The aortic valve is normal in structure. Aortic valve regurgitation is mild. No aortic stenosis is present. Aortic valve mean gradient measures 5.0 mmHg. Aortic valve peak gradient measures 9.2 mmHg. Aortic valve area, by VTI measures 1.81 cm. Pulmonic Valve: The pulmonic valve was normal in structure. Pulmonic valve regurgitation is not visualized. No evidence of pulmonic stenosis. Aorta: The aortic root is normal in size and  structure. Venous: The inferior vena cava is normal in size with greater than 50% respiratory variability, suggesting right atrial pressure of 3 mmHg. IAS/Shunts: No atrial level shunt detected by color flow Doppler.  LEFT VENTRICLE PLAX 2D LVIDd:         5.10 cm  Diastology LVIDs:         3.70 cm  LV e' medial:    4.57 cm/s LV PW:         1.00 cm  LV E/e' medial:  14.4 LV IVS:        1.10 cm  LV e' lateral:   5.11 cm/s LVOT diam:     1.80 cm  LV E/e' lateral: 12.9 LV SV:         54 LV SV Index:   29 LVOT Area:     2.54 cm  RIGHT VENTRICLE          IVC RV Basal diam:  3.20 cm  IVC diam: 1.00 cm LEFT ATRIUM             Index       RIGHT ATRIUM           Index LA diam:        3.50 cm 1.88 cm/m  RA Area:     14.50 cm LA Vol (A2C):   36.7 ml 19.70 ml/m RA Volume:   34.60 ml  18.57 ml/m LA Vol (A4C):   43.0 ml 23.08 ml/m LA Biplane Vol: 40.6 ml  21.79 ml/m  AORTIC VALVE AV Area (Vmax):    1.74 cm AV Area (Vmean):   1.51 cm AV Area (VTI):     1.81 cm AV Vmax:           152.00 cm/s AV Vmean:          105.000 cm/s AV VTI:            0.296 m AV Peak Grad:      9.2 mmHg AV Mean Grad:      5.0 mmHg LVOT Vmax:         104.00 cm/s LVOT Vmean:        62.200 cm/s LVOT VTI:          0.211 m LVOT/AV VTI ratio: 0.71  AORTA Ao Root diam: 3.20 cm Ao Asc diam:  3.40 cm MITRAL VALVE MV Area (PHT): 4.49 cm     SHUNTS MV Decel Time: 169 msec     Systemic VTI:  0.21 m MV E velocity: 65.70 cm/s   Systemic Diam: 1.80 cm MV A velocity: 110.00 cm/s MV E/A ratio:  0.60 Mihai Croitoru MD Electronically signed by Sanda Klein MD Signature Date/Time: 08/03/2020/12:28:56 PM    Final    VAS [place] LOWER EXTREMITY VENOUS (DVT)  Result Date: 08/03/2020  Lower Venous DVT Study Patient Name:  BRANDICE BUSSER  Date of Exam:   08/03/2020 Medical Rec #: [id number]          Accession #:    [id number] Date of Birth: 11-27-1945          Patient Gender: F Patient Age:   74Y Exam Location:  [facility] Procedure:      VAS [place] LOWER EXTREMITY VENOUS (DVT) Referring Phys: [id number] Rosalin Hawking --------------------------------------------------------------------------------  Indications: Embolic stroke, [id number].  Comparison Study: No prior studies. Performing Technologist: Darlin Coco RDMS,RVT  Examination Guidelines: A complete evaluation includes B-mode imaging, spectral Doppler, color Doppler, and power Doppler as needed of all accessible portions  of each vessel. Bilateral testing is considered an integral part of a complete examination. Limited examinations for reoccurring indications may be performed as noted. The reflux portion of the exam is performed with the patient in reverse Trendelenburg.  +---------+---------------+---------+-----------+----------+--------------+ RIGHT    CompressibilityPhasicitySpontaneityPropertiesThrombus Aging  +---------+---------------+---------+-----------+----------+--------------+ CFV      Full           Yes      Yes                                 +---------+---------------+---------+-----------+----------+--------------+ SFJ      Full                                                        +---------+---------------+---------+-----------+----------+--------------+ FV Prox  Full                                                        +---------+---------------+---------+-----------+----------+--------------+ FV Mid   Full                                                        +---------+---------------+---------+-----------+----------+--------------+ FV DistalFull                                                        +---------+---------------+---------+-----------+----------+--------------+ PFV      Full                                                        +---------+---------------+---------+-----------+----------+--------------+ POP      Full           Yes      Yes                                 +---------+---------------+---------+-----------+----------+--------------+ PTV      Full                                                        +---------+---------------+---------+-----------+----------+--------------+ PERO     Full                                                        +---------+---------------+---------+-----------+----------+--------------+ Gastroc  Full                                                        +---------+---------------+---------+-----------+----------+--------------+   +---------+---------------+---------+-----------+----------+--------------+  LEFT     CompressibilityPhasicitySpontaneityPropertiesThrombus Aging +---------+---------------+---------+-----------+----------+--------------+ CFV      Full           Yes      Yes                                  +---------+---------------+---------+-----------+----------+--------------+ SFJ      Full                                                        +---------+---------------+---------+-----------+----------+--------------+ FV Prox  Full                                                        +---------+---------------+---------+-----------+----------+--------------+ FV Mid   Full                                                        +---------+---------------+---------+-----------+----------+--------------+ FV DistalFull                                                        +---------+---------------+---------+-----------+----------+--------------+ PFV      Full                                                        +---------+---------------+---------+-----------+----------+--------------+ POP      Full           Yes      Yes                                 +---------+---------------+---------+-----------+----------+--------------+ PTV      Full                                                        +---------+---------------+---------+-----------+----------+--------------+ PERO     Full                                                        +---------+---------------+---------+-----------+----------+--------------+ Gastroc  Full                                                        +---------+---------------+---------+-----------+----------+--------------+  Summary: RIGHT: - There is no evidence of deep vein thrombosis in the lower extremity.  - No cystic structure found in the popliteal fossa.  LEFT: - There is no evidence of deep vein thrombosis in the lower extremity.  - No cystic structure found in the popliteal fossa.  *See table(s) above for measurements and observations. Electronically signed by Monica Martinez MD on 08/03/2020 at 20:38:36 PM.    Final      Assessment and Plan:   New diagnosis of LV dysfunction; acute heart failure EF  35%. Warm and dry. Wall motion abnormality concerning for LAD infarct. Dyskinesis of the apex raises the question of a past apical thrombus (not seen on today's contrasted echo) Will need addition of GDMT Plan for left heart catheterization early this week. Keep NPO Sunday night.  2. COVID 19 - on remdesivir per IM  3. Stroke Cont asa and plavix - cont statin    For questions or updates, please contact [place] Please consult [email or web address] for contact info under    Signed, Vickie Epley, MD  08/04/2020 11:42 AM

## 2020-08-04 NOTE — Progress Notes (Signed)
PROGRESS NOTE    Yolanda Saunders  LHT:[id number] DOB: Aug 21, 1945 DOA: 08/02/2020 PCP: Johny Blamer, MD    No chief complaint on file.   Brief Narrative:   Yolanda Saunders is a 75 y.o. female with medical history significant for CVA, hypertension, IBS, insulin-dependent type 2 diabetes, CKD stage IIIa, asthma and hyperlipidemia who presents with concerns of increasing confusion.   About 2 days ago has been began to note that she was having confusion.  She was confused about how to use a remote and her phone.  Also her speech was not making much sense.  She also noticed some changes to her vision although denies double vision.  Denies any focal extremity weakness.  She is mostly wheelchair-bound due to right hip pain and recently underwent lumbar microdiscectomy on 5/13.  Her right hip pain recently returned and she has repeat surgery scheduled for next week. Patient has recently been undergoing a lot of stress.  Her son recently passed and his funeral was last Friday which she missed as she was in the hospital.   Assessment & Plan:   Principal Problem:   Stroke [facility]) Active Problems:   Hyperlipidemia   Type 2 diabetes mellitus with stage 3 chronic kidney disease, with long-term current use of insulin (HCC)   CKD (chronic kidney disease), stage III (HCC)   COVID-19 virus infection   Stroke (cerebrum) (HCC)   Acute CVA -MRI brain significant for moderate size acute right PCA distribution infarct, underlying moderate chronic microvascular ischemic disease, with few scattered remote lacunar infarcts in the basal ganglia and right thalamus -CTA head and neck significant for acute right P2 occlusion, with severe 75% stenosis in the right PCA, with severe distal left P2 stenosis. -Currently on aspirin and Plavix, recommendation for dual antiplatelet therapy for 35-month, then Plavix alone -Lower extremities venous Dopplers negative for DVT -2D echo with EF 35 to 40% -Recommendation  for loop recorder to rule out A. fib, neurology discussed with Dr. Mosetta Anis, given her COVID-19 status, likely plan for loop recorder as an outpatient -LDL significantly elevated at 221 -A1c is elevated at 7.2 -PT/OT consulted    COVID virus infection -Patient is unvaccinated -At risk of COVID complication, so we will treat with 3 days of IV remdesivir especially she does presents with acute CVA which may be contributing to her stroke ,and will extend to 5 days if she becomes symptomatic -We will check chest x-ray to rule out any pneumonia  Cardiomyopathy with EF 35% -New diagnosis, concerning for LAD infarct -Plan for left heart cath early next week, to be n.p.o. Sunday night.   Insulin-dependent type 2 diabetes Hemoglobin A1c of 9.1 on 03/2020 it is 7.2 this admission -Continue with insulin sliding scale   Hypertension - Allow for permissive hypertension during stroke work-up, goal to normalize in 2 to 3 days  Hyperlipidemia LDL is 221, likely his Lipitor from 40-80-   CKD stage IIIa - Creatinine stable   Hyperlipidemia - Continue statin  DVT prophylaxis: Lovenox Code Status: Full Family Communication: Discussed with husband at bedside Disposition:   Status is: inpatient  The patient will require care spanning > 2 midnights and should be moved to inpatient because: IV treatments appropriate due to intensity of illness or inability to take PO  Dispo: The patient is from: Home              Anticipated d/c is to: Home  Patient currently is not medically stable to d/c.  She will need further treatment and work-up for her stroke, as well will need IV remdesivir to treat COVID as is likely contributing to her stroke as well.   Difficult to place patient No       Consultants:  Neurology Cardiology  Subjective:  Patient still reports left hemianopsia, denies fever, chills or new deficits  Objective: Vitals:   08/04/20 0000 08/04/20 0400 08/04/20 0758  08/04/20 1211  BP: (!) 153/71 (!) 143/75 123/63 (!) 144/67  Pulse: 77 78 78 84  Resp: Temp: 98.4 F (36.9 C) 98.3 F (36.8 C) 98.1 F (36.7 C) 98.7 F (37.1 C)  TempSrc: Oral Oral Oral Oral  SpO2: 93% 95% 96% 100%  Weight:      Height:        Intake/Output Summary (Last 24 hours) at 08/04/2020 1627 Last data filed at 08/04/2020 1508 Gross per 24 hour  Intake 540 ml  Output --  Net 540 ml   Filed Weights   08/03/20 0101  Weight: 83.1 kg    Examination:  Awake Alert, Oriented X 2, Normal affect Symmetrical Chest wall movement, Good air movement bilaterally, CTAB RRR,No Gallops,Rubs or new Murmurs, No Parasternal Heave +ve B.Sounds, Abd Soft, No tenderness, No rebound - guarding or rigidity. No Cyanosis, Clubbing or edema, No new Rash or bruise      Data Reviewed: I have personally reviewed following labs and imaging studies  CBC: Recent Labs  Lab 08/02/20 1616 08/02/20 1654 08/04/20 0022  WBC 7.1  --  6.5  NEUTROABS 5.2  --   --   HGB 14.4 14.6 13.3  HCT 43.9 43.0 40.0  MCV 94.2  --  93.0  PLT 213  --  193    Basic Metabolic Panel: Recent Labs  Lab 08/02/20 1616 08/02/20 1654 08/04/20 0022  NA 136 138 138  K 3.8 3.8 3.3*  CL 102 104 105  CO2 23  --  27  GLUCOSE 141* 143* 115*  BUN CREATININE 1.22* 1.20* 1.33*  CALCIUM 9.6  --  9.2    GFR: Estimated Creatinine Clearance: 36.8 mL/min (A) (by C-G formula based on SCr of 1.33 mg/dL (H)).  Liver Function Tests: Recent Labs  Lab 08/02/20 1616  AST 15  ALT 16  ALKPHOS 57  BILITOT 0.9  PROT 6.5  ALBUMIN 3.6    CBG: Recent Labs  Lab 08/03/20 1801 08/03/20 2025 08/04/20 0206 08/04/20 0822 08/04/20 1214  GLUCAP 169* 144* 122* 131* 149*     Recent Results (from the past 240 hour(s))  SARS CORONAVIRUS 2 (TAT 6-24 HRS) Nasopharyngeal Nasopharyngeal Swab     Status: Abnormal   Collection Time: 08/02/20  9:37 PM   Specimen: Nasopharyngeal Swab  Result Value Ref  Range Status   SARS Coronavirus 2 POSITIVE (A) NEGATIVE Final    Comment: (NOTE) SARS-CoV-2 target nucleic acids are DETECTED.  The SARS-CoV-2 RNA is generally detectable in upper and lower respiratory specimens during the acute phase of infection. Positive results are indicative of the presence of SARS-CoV-2 RNA. Clinical correlation with patient history and other diagnostic information is  necessary to determine patient infection status. Positive results do not rule out bacterial infection or co-infection with other viruses.  The expected result is Negative.  Fact Sheet for Patients: HairSlick.no  Fact Sheet for Healthcare Providers: quierodirigir.com  This test is not yet approved or cleared by the [place] FDA  and  has been authorized for detection and/or diagnosis of SARS-CoV-2 by FDA under an Emergency Use Authorization (EUA). This EUA will remain  in effect (meaning this test can be used) for the duration of the COVID-19 declaration under Section 564(b)(1) of the Act, 21 U. S.C. section 360bbb-3(b)(1), unless the authorization is terminated or revoked sooner.   Performed at [facility], [address] [place], [place] [address]   MRSA Next Gen by PCR, Nasal     Status: None   Collection Time: 08/03/20  1:28 AM   Specimen: Nasal Mucosa; Nasal Swab  Result Value Ref Range Status   MRSA by PCR Next Gen NOT DETECTED NOT DETECTED Final    Comment: (NOTE) The GeneXpert MRSA Assay (FDA approved for NASAL specimens only), is one component of a comprehensive MRSA colonization surveillance program. It is not intended to diagnose MRSA infection nor to guide or monitor treatment for MRSA infections. Test performance is not FDA approved in patients less than 60 years old. Performed at [facility], [address] [place], [place] [address]          Radiology Studies: CT ANGIO HEAD W OR WO  CONTRAST  Result Date: 08/02/2020 CLINICAL DATA:  Initial evaluation for neuro deficit, stroke. EXAM: CT ANGIOGRAPHY HEAD AND NECK TECHNIQUE: Multidetector CT imaging of the head and neck was performed using the standard protocol during bolus administration of intravenous contrast. Multiplanar CT image reconstructions and MIPs were obtained to evaluate the vascular anatomy. Carotid stenosis measurements (when applicable) are obtained utilizing NASCET criteria, using the distal internal carotid diameter as the denominator. CONTRAST:  42mL OMNIPAQUE IOHEXOL 350 MG/ML SOLN COMPARISON:  Prior CT from earlier the same day. FINDINGS: CTA NECK FINDINGS Aortic arch: Visualized aortic arch normal in caliber with normal 3 vessel morphology. Mild atheromatous change about the arch and origin of the great vessels without hemodynamically significant stenosis. Right carotid system: Right common and internal carotid arteries patent without stenosis, dissection or occlusion. Left carotid system: Left common and internal carotid arteries patent without stenosis, dissection, or occlusion. Mild atheromatous change about the left carotid bulb without significant stenosis. Vertebral arteries: Both vertebral arteries arise from subclavian arteries. Left vertebral artery dominant. Vertebral arteries patent without stenosis, dissection, or occlusion. Short-segment fenestration at the right V2/V3 junction noted (series 7, image 168). Skeleton: No visible acute osseous finding. No discrete or worrisome osseous lesions. Other neck: No other acute soft tissue abnormality within the neck. Few scattered thyroid nodules noted, largest of which measures 1.3 cm on the right. Findings felt to be of doubtful significance given size and patient age, no follow-up imaging recommended (ref: J Am Coll Radiol. 2015 Feb;12(2): 143-50).No other mass or adenopathy. Upper chest: Small layering left pleural effusion partially visualized. Visualized upper  chest demonstrates no other acute findings. Review of the MIP images confirms the above findings CTA HEAD FINDINGS Anterior circulation: Horizontal petrous left ICA widely patent. Fairly severe atheromatous stenosis of up to 75% present at the or horizontal petrous right ICA (series 7, image 122). Scattered atheromatous change within the carotid siphons distally with associated mild to moderate diffuse narrowing. A1 segments patent bilaterally. Right A1 hypoplastic, accounting for the diminutive right ICA is compared to the left. Normal anterior communicating artery complex. Anterior cerebral arteries patent to their distal aspects without stenosis. No M1 stenosis or occlusion. Normal MCA bifurcations. Short-segment moderate proximal left M2 stenosis, superior division noted (series 11, image 19). Distal MCA branches well perfused  and symmetric. Distal small vessel atheromatous irregularity. Posterior circulation: Both V4 segments patent to the vertebrobasilar junction without stenosis. Left vertebral artery dominant. Right PICA origin patent and normal. Left PICA not seen. Basilar patent to its distal aspect without stenosis. Superior cerebellar arteries patent bilaterally. Both PCAs primarily supplied via the basilar. Left PCA widely patent proximally. Focal severe distal left P2 stenosis noted (series 8, image 117). On the right, there is occlusion at the proximal right P2 segment (series 8, image 97). Scant attenuated flow distally within the right PCA distribution could be related to subocclusive thrombus and/or collateralization. Venous sinuses: Grossly patent allowing for timing the contrast bolus. Anatomic variants: Hypoplastic right A1 segment.  No aneurysm. Review of the MIP images confirms the above findings IMPRESSION: 1. Acute occlusion at the proximal right P2 segment, likely embolic. Scant attenuated flow within the right PCA distribution distally could be related to subocclusive thrombus and/or  collateralization. No embolic source seen elsewhere about the major arterial vasculature of the head and neck. 2. Severe 75% stenosis at the horizontal petrous right ICA. 3. Severe distal left P2 stenosis. 4. Additional scattered atheromatous change about the major arterial vasculature of the head and neck as above. No other proximal high-grade or correctable stenosis. 5. Small layering left pleural effusion, partially visualized. Electronically Signed   By: Rise Mu M.D.   On: 08/02/2020 22:52   CT HEAD WO CONTRAST  Result Date: 08/02/2020 CLINICAL DATA:  Mental status change, unknown cause. Additional history provided: Patient reports "not feeling like herself." Patient reports vision "isn't up to par." Patient reports dizziness and confusion. EXAM: CT HEAD WITHOUT CONTRAST TECHNIQUE: Contiguous axial images were obtained from the base of the skull through the vertex without intravenous contrast. COMPARISON:  Head CT 11/07/2016.  Brain MRI 07/18/2013. FINDINGS: Brain: Mild generalized cerebral and cerebellar atrophy. Abnormal cortical/subcortical hypodensity within the right temporal and occipital lobes measuring 8.9 x 2.6 x 1.4 cm (AP x TV x CC) compatible with acute right PCA territory infarction. Acute infarction changes are also present within the callosal splenium on the right. Chronic lacunar infarct within the left lentiform nucleus not definitively present on the prior head CT of 11/07/2016. Background mild patchy and ill-defined hypoattenuation within the cerebral white matter, nonspecific but compatible chronic small vessel ischemic disease. Redemonstrated nonspecific linear calcifications within the inferior pons (series 3, image 26). There is no acute intracranial hemorrhage. No extra-axial fluid collection. No evidence of an intracranial mass. No midline shift. Vascular: No hyperdense vessel.  Atherosclerotic calcifications. Skull: Normal. Negative for fracture or focal lesion.  Sinuses/Orbits: Visualized orbits show no acute finding. Mild bilateral frontal and ethmoid sinus mucosal thickening. These results were called by telephone at the time of interpretation on 08/02/2020 at 5:31 pm to provider PA [facility], who verbally acknowledged these results. IMPRESSION: Large acute cortical/subcortical right PCA territory infarct within the right temporal and occipital lobes, as well as callosal splenium. No significant mass effect at this time. No evidence of hemorrhagic conversion. Chronic lacunar infarct within the left basal ganglia, not definitively present on the prior head CT of 11/07/2016. Background mild generalized parenchymal atrophy and mild cerebral white matter chronic small vessel ischemic disease. Electronically Signed   By: Jackey Loge DO   On: 08/02/2020 17:33   CT ANGIO NECK W OR WO CONTRAST  Result Date: 08/02/2020 CLINICAL DATA:  Initial evaluation for neuro deficit, stroke. EXAM: CT ANGIOGRAPHY HEAD AND NECK TECHNIQUE: Multidetector CT imaging of the head and neck was  performed using the standard protocol during bolus administration of intravenous contrast. Multiplanar CT image reconstructions and MIPs were obtained to evaluate the vascular anatomy. Carotid stenosis measurements (when applicable) are obtained utilizing NASCET criteria, using the distal internal carotid diameter as the denominator. CONTRAST:  75mL OMNIPAQUE IOHEXOL 350 MG/ML SOLN COMPARISON:  Prior CT from earlier the same day. FINDINGS: CTA NECK FINDINGS Aortic arch: Visualized aortic arch normal in caliber with normal 3 vessel morphology. Mild atheromatous change about the arch and origin of the great vessels without hemodynamically significant stenosis. Right carotid system: Right common and internal carotid arteries patent without stenosis, dissection or occlusion. Left carotid system: Left common and internal carotid arteries patent without stenosis, dissection, or occlusion. Mild atheromatous  change about the left carotid bulb without significant stenosis. Vertebral arteries: Both vertebral arteries arise from subclavian arteries. Left vertebral artery dominant. Vertebral arteries patent without stenosis, dissection, or occlusion. Short-segment fenestration at the right V2/V3 junction noted (series 7, image 168). Skeleton: No visible acute osseous finding. No discrete or worrisome osseous lesions. Other neck: No other acute soft tissue abnormality within the neck. Few scattered thyroid nodules noted, largest of which measures 1.3 cm on the right. Findings felt to be of doubtful significance given size and patient age, no follow-up imaging recommended (ref: J Am Coll Radiol. 2015 Feb;12(2): 143-50).No other mass or adenopathy. Upper chest: Small layering left pleural effusion partially visualized. Visualized upper chest demonstrates no other acute findings. Review of the MIP images confirms the above findings CTA HEAD FINDINGS Anterior circulation: Horizontal petrous left ICA widely patent. Fairly severe atheromatous stenosis of up to 75% present at the or horizontal petrous right ICA (series 7, image 122). Scattered atheromatous change within the carotid siphons distally with associated mild to moderate diffuse narrowing. A1 segments patent bilaterally. Right A1 hypoplastic, accounting for the diminutive right ICA is compared to the left. Normal anterior communicating artery complex. Anterior cerebral arteries patent to their distal aspects without stenosis. No M1 stenosis or occlusion. Normal MCA bifurcations. Short-segment moderate proximal left M2 stenosis, superior division noted (series 11, image 19). Distal MCA branches well perfused and symmetric. Distal small vessel atheromatous irregularity. Posterior circulation: Both V4 segments patent to the vertebrobasilar junction without stenosis. Left vertebral artery dominant. Right PICA origin patent and normal. Left PICA not seen. Basilar patent to its  distal aspect without stenosis. Superior cerebellar arteries patent bilaterally. Both PCAs primarily supplied via the basilar. Left PCA widely patent proximally. Focal severe distal left P2 stenosis noted (series 8, image 117). On the right, there is occlusion at the proximal right P2 segment (series 8, image 97). Scant attenuated flow distally within the right PCA distribution could be related to subocclusive thrombus and/or collateralization. Venous sinuses: Grossly patent allowing for timing the contrast bolus. Anatomic variants: Hypoplastic right A1 segment.  No aneurysm. Review of the MIP images confirms the above findings IMPRESSION: 1. Acute occlusion at the proximal right P2 segment, likely embolic. Scant attenuated flow within the right PCA distribution distally could be related to subocclusive thrombus and/or collateralization. No embolic source seen elsewhere about the major arterial vasculature of the head and neck. 2. Severe 75% stenosis at the horizontal petrous right ICA. 3. Severe distal left P2 stenosis. 4. Additional scattered atheromatous change about the major arterial vasculature of the head and neck as above. No other proximal high-grade or correctable stenosis. 5. Small layering left pleural effusion, partially visualized. Electronically Signed   By: Rise MuBenjamin  McClintock M.D.   On: 08/02/2020  22:52   MR BRAIN WO CONTRAST  Result Date: 08/02/2020 CLINICAL DATA:  Initial evaluation for acute stroke. EXAM: MRI HEAD WITHOUT CONTRAST TECHNIQUE: Multiplanar, multiecho pulse sequences of the brain and surrounding structures were obtained without intravenous contrast. COMPARISON:  Prior CT and CTA from earlier the same day. FINDINGS: Brain: Cerebral volume within normal limits for age. Patchy and confluent T2/FLAIR hyperintensity within the periventricular and deep white matter both cerebral hemispheres as well as the pons, most consistent with chronic small vessel ischemic disease, moderate in  nature. Few scattered superimposed remote lacunar infarcts noted about the basal ganglia and right thalamus. Patchy restricted diffusion involving the parasagittal right temporal occipital region consistent with a moderate-sized acute right PCA distribution infarct. Mild patchy involvement of the right thalamus and splenium. Minimal associated petechial hemorrhage without frank hemorrhagic transformation or significant mass effect. No other evidence for acute or subacute ischemia. Gray-white matter differentiation otherwise maintained. No encephalomalacia to suggest chronic cortical infarction elsewhere within the brain. No other evidence for acute or chronic intracranial hemorrhage. No mass lesion, midline shift or mass effect. No hydrocephalus or extra-axial fluid collection. Pituitary gland suprasellar region normal. Midline structures intact. Vascular: Major intracranial vascular flow voids are maintained. Skull and upper cervical spine: Craniocervical junction within normal limits. Bone marrow signal intensity normal. No scalp soft tissue abnormality. Sinuses/Orbits: Patient status post bilateral ocular lens replacement. Right gaze noted. Scattered mucosal thickening noted throughout the paranasal sinuses. No mastoid effusion. Other: None. IMPRESSION: 1. Moderate-sized acute right PCA distribution infarct as above. Associated faint petechial hemorrhage without frank hemorrhagic transformation or significant mass effect. 2. Underlying moderate chronic microvascular ischemic disease with a few scattered remote lacunar infarcts about the basal ganglia and right thalamus. Electronically Signed   By: Rise Mu M.D.   On: 08/02/2020 23:03   DG Chest Port 1 View  Result Date: 08/03/2020 CLINICAL DATA:  COVID. EXAM: PORTABLE CHEST 1 VIEW COMPARISON:  Chest x-ray dated April 16, 2011. FINDINGS: The heart size and mediastinal contours are within normal limits. Both lungs are clear. The visualized skeletal  structures are unremarkable. IMPRESSION: No active disease. Electronically Signed   By: Obie Dredge M.D.   On: 08/03/2020 14:48   ECHOCARDIOGRAM COMPLETE  Result Date: 08/03/2020    ECHOCARDIOGRAM REPORT   Patient Name:   ZANYIA SILBAUGH Date of Exam: 08/03/2020 Medical Rec #:  [id number]         Height:       63.0 in Accession #:    [id number]        Weight:       183.2 lb Date of Birth:  November 21, 1945         BSA:          1.863 m Patient Age:    74 years          BP:           138/92 mmHg Patient Gender: F                 HR:           71 bpm. Exam Location:  Inpatient Procedure: 2D Echo, Cardiac Doppler and Color Doppler Indications:    Stroke  History:        Patient has prior history of Echocardiogram examinations, most                 recent 10/27/2016. Stroke; Risk Factors:Former Smoker, Diabetes,  Dyslipidemia and Hypertension.  Sonographer:    Shirlean Kelly Referring Phys: [id number] CHING T TU IMPRESSIONS  1. No left ventricular thrombus is seen with Definity contrast. Left ventricular ejection fraction, by estimation, is 35 to 40%. The left ventricle has moderately decreased function. The left ventricle has no regional wall motion abnormalities. Left ventricular diastolic parameters are consistent with Grade I diastolic dysfunction (impaired relaxation). There is severe hypokinesis of the left ventricular, mid-apical anteroseptal wall and anterior wall. There is mild dyskinesis of the left ventricular, entire apical segment.  2. Right ventricular systolic function is normal. The right ventricular size is normal. Tricuspid regurgitation signal is inadequate for assessing PA pressure.  3. The mitral valve is normal in structure. Trivial mitral valve regurgitation. No evidence of mitral stenosis.  4. The aortic valve is normal in structure. Aortic valve regurgitation is mild. No aortic stenosis is present.  5. The inferior vena cava is normal in size with greater than 50% respiratory  variability, suggesting right atrial pressure of 3 mmHg. Comparison(s): Prior images unable to be directly viewed, comparison made by report only. Changes from prior study are noted. The left ventricular function is significantly worse. The left ventricular wall motion abnormalities are new. FINDINGS  Left Ventricle: No left ventricular thrombus is seen with Definity contrast. Left ventricular ejection fraction, by estimation, is 35 to 40%. The left ventricle has moderately decreased function. The left ventricle has no regional wall motion abnormalities. Severe hypokinesis of the left ventricular, mid-apical anteroseptal wall and anterior wall. Mild dyskinesis of the left ventricular, entire apical segment. The left ventricular internal cavity size was normal in size. There is no left ventricular hypertrophy. Left ventricular diastolic parameters are consistent with Grade I diastolic dysfunction (impaired relaxation).  LV Wall Scoring: The apical lateral segment, apical septal segment, apical inferior segment, and apex are dyskinetic. The mid and distal anterior wall and mid anteroseptal segment are hypokinetic. Right Ventricle: The right ventricular size is normal. No increase in right ventricular wall thickness. Right ventricular systolic function is normal. Tricuspid regurgitation signal is inadequate for assessing PA pressure. Left Atrium: Left atrial size was normal in size. Right Atrium: Right atrial size was normal in size. Pericardium: There is no evidence of pericardial effusion. Mitral Valve: The mitral valve is normal in structure. Trivial mitral valve regurgitation. No evidence of mitral valve stenosis. Tricuspid Valve: The tricuspid valve is normal in structure. Tricuspid valve regurgitation is not demonstrated. No evidence of tricuspid stenosis. Aortic Valve: The aortic valve is normal in structure. Aortic valve regurgitation is mild. No aortic stenosis is present. Aortic valve mean gradient measures  5.0 mmHg. Aortic valve peak gradient measures 9.2 mmHg. Aortic valve area, by VTI measures 1.81 cm. Pulmonic Valve: The pulmonic valve was normal in structure. Pulmonic valve regurgitation is not visualized. No evidence of pulmonic stenosis. Aorta: The aortic root is normal in size and structure. Venous: The inferior vena cava is normal in size with greater than 50% respiratory variability, suggesting right atrial pressure of 3 mmHg. IAS/Shunts: No atrial level shunt detected by color flow Doppler.  LEFT VENTRICLE PLAX 2D LVIDd:         5.10 cm  Diastology LVIDs:         3.70 cm  LV e' medial:    4.57 cm/s LV PW:         1.00 cm  LV E/e' medial:  14.4 LV IVS:        1.10 cm  LV e' lateral:  5.11 cm/s LVOT diam:     1.80 cm  LV E/e' lateral: 12.9 LV SV:         54 LV SV Index:   29 LVOT Area:     2.54 cm  RIGHT VENTRICLE          IVC RV Basal diam:  3.20 cm  IVC diam: 1.00 cm LEFT ATRIUM             Index       RIGHT ATRIUM           Index LA diam:        3.50 cm 1.88 cm/m  RA Area:     14.50 cm LA Vol (A2C):   36.7 ml 19.70 ml/m RA Volume:   34.60 ml  18.57 ml/m LA Vol (A4C):   43.0 ml 23.08 ml/m LA Biplane Vol: 40.6 ml 21.79 ml/m  AORTIC VALVE AV Area (Vmax):    1.74 cm AV Area (Vmean):   1.51 cm AV Area (VTI):     1.81 cm AV Vmax:           152.00 cm/s AV Vmean:          105.000 cm/s AV VTI:            0.296 m AV Peak Grad:      9.2 mmHg AV Mean Grad:      5.0 mmHg LVOT Vmax:         104.00 cm/s LVOT Vmean:        62.200 cm/s LVOT VTI:          0.211 m LVOT/AV VTI ratio: 0.71  AORTA Ao Root diam: 3.20 cm Ao Asc diam:  3.40 cm MITRAL VALVE MV Area (PHT): 4.49 cm     SHUNTS MV Decel Time: 169 msec     Systemic VTI:  0.21 m MV E velocity: 65.70 cm/s   Systemic Diam: 1.80 cm MV A velocity: 110.00 cm/s MV E/A ratio:  0.60 Mihai Croitoru MD Electronically signed by Thurmon Fair MD Signature Date/Time: 08/03/2020/12:28:56 PM    Final    VAS [place] LOWER EXTREMITY VENOUS (DVT)  Result Date: 08/03/2020  Lower  Venous DVT Study Patient Name:  DONETTE MAINWARING  Date of Exam:   08/03/2020 Medical Rec #: [id number]          Accession #:    [id number] Date of Birth: April 01, 1945          Patient Gender: F Patient Age:   74Y Exam Location:  [facility] Procedure:      VAS [place] LOWER EXTREMITY VENOUS (DVT) Referring Phys: [id number] Marvel Plan --------------------------------------------------------------------------------  Indications: Embolic stroke, [id number].  Comparison Study: No prior studies. Performing Technologist: Jean Rosenthal RDMS,RVT  Examination Guidelines: A complete evaluation includes B-mode imaging, spectral Doppler, color Doppler, and power Doppler as needed of all accessible portions of each vessel. Bilateral testing is considered an integral part of a complete examination. Limited examinations for reoccurring indications may be performed as noted. The reflux portion of the exam is performed with the patient in reverse Trendelenburg.  +---------+---------------+---------+-----------+----------+--------------+ RIGHT    CompressibilityPhasicitySpontaneityPropertiesThrombus Aging +---------+---------------+---------+-----------+----------+--------------+ CFV      Full           Yes      Yes                                 +---------+---------------+---------+-----------+----------+--------------+ SFJ  Full                                                        +---------+---------------+---------+-----------+----------+--------------+ FV Prox  Full                                                        +---------+---------------+---------+-----------+----------+--------------+ FV Mid   Full                                                        +---------+---------------+---------+-----------+----------+--------------+ FV DistalFull                                                        +---------+---------------+---------+-----------+----------+--------------+ PFV       Full                                                        +---------+---------------+---------+-----------+----------+--------------+ POP      Full           Yes      Yes                                 +---------+---------------+---------+-----------+----------+--------------+ PTV      Full                                                        +---------+---------------+---------+-----------+----------+--------------+ PERO     Full                                                        +---------+---------------+---------+-----------+----------+--------------+ Gastroc  Full                                                        +---------+---------------+---------+-----------+----------+--------------+   +---------+---------------+---------+-----------+----------+--------------+ LEFT     CompressibilityPhasicitySpontaneityPropertiesThrombus Aging +---------+---------------+---------+-----------+----------+--------------+ CFV      Full           Yes      Yes                                 +---------+---------------+---------+-----------+----------+--------------+  SFJ      Full                                                        +---------+---------------+---------+-----------+----------+--------------+ FV Prox  Full                                                        +---------+---------------+---------+-----------+----------+--------------+ FV Mid   Full                                                        +---------+---------------+---------+-----------+----------+--------------+ FV DistalFull                                                        +---------+---------------+---------+-----------+----------+--------------+ PFV      Full                                                        +---------+---------------+---------+-----------+----------+--------------+ POP      Full           Yes      Yes                                  +---------+---------------+---------+-----------+----------+--------------+ PTV      Full                                                        +---------+---------------+---------+-----------+----------+--------------+ PERO     Full                                                        +---------+---------------+---------+-----------+----------+--------------+ Gastroc  Full                                                        +---------+---------------+---------+-----------+----------+--------------+     Summary: RIGHT: - There is no evidence of deep vein thrombosis in the lower extremity.  - No cystic structure found in the popliteal fossa.  LEFT: - There is no evidence of deep vein thrombosis in the lower extremity.  - No cystic structure found in the popliteal  fossa.  *See table(s) above for measurements and observations. Electronically signed by Sherald Hess MD on 08/03/2020 at 6:38:36 PM.    Final         Scheduled Meds:  aspirin EC  81 mg Oral Daily   atorvastatin  80 mg Oral Daily   clopidogrel  75 mg Oral Daily   enoxaparin (LOVENOX) injection  40 mg Subcutaneous Q24H   insulin aspart  0-9 Units Subcutaneous TID PC,HS,0200   Continuous Infusions:  remdesivir 100 mg in NS 100 mL 100 mg (08/04/20 0844)     LOS: 1 day       Huey Bienenstock, MD [facility]   To contact the attending provider between 7A-7P or the covering provider during after hours 7P-7A, please log into the web site [email or web address] and access using universal [place] password for that web site. If you do not have the password, please call the hospital operator.  08/04/2020, 4:27 PM

## 2020-08-05 DIAGNOSIS — U071 COVID-19: Secondary | ICD-10-CM | POA: Diagnosis not present

## 2020-08-05 DIAGNOSIS — I5021 Acute systolic (congestive) heart failure: Secondary | ICD-10-CM

## 2020-08-05 DIAGNOSIS — I63431 Cerebral infarction due to embolism of right posterior cerebral artery: Secondary | ICD-10-CM | POA: Diagnosis not present

## 2020-08-05 LAB — GLUCOSE, CAPILLARY
Glucose-Capillary: 132 mg/dL — ABNORMAL HIGH (ref 70–99)
Glucose-Capillary: 157 mg/dL — ABNORMAL HIGH (ref 70–99)
Glucose-Capillary: 147 mg/dL — ABNORMAL HIGH (ref 70–99)
Glucose-Capillary: 186 mg/dL — ABNORMAL HIGH (ref 70–99)

## 2020-08-05 MED ORDER — HYDROCODONE-ACETAMINOPHEN 5-325 MG PO TABS
1.0000 | ORAL_TABLET | Freq: Once | ORAL | Status: AC
  Administered 2020-08-05: 1 via ORAL
  Filled 2020-08-05 (×2): qty 1

## 2020-08-05 MED ORDER — INSULIN ASPART 100 UNIT/ML IJ SOLN
0.0000 [IU] | Freq: Three times a day (TID) | INTRAMUSCULAR | Status: DC
  Administered 2020-08-05 (×2): 2 [IU] via SUBCUTANEOUS
  Administered 2020-08-05 – 2020-08-06 (×5): 1 [IU] via SUBCUTANEOUS
  Administered 2020-08-07: 2 [IU] via SUBCUTANEOUS
  Administered 2020-08-07 – 2020-08-08 (×2): 1 [IU] via SUBCUTANEOUS

## 2020-08-05 MED ORDER — PANTOPRAZOLE SODIUM 40 MG PO TBEC
40.0000 mg | DELAYED_RELEASE_TABLET | Freq: Every day | ORAL | Status: DC
  Administered 2020-08-05 – 2020-08-08 (×4): 40 mg via ORAL
  Filled 2020-08-05 (×4): qty 1

## 2020-08-05 NOTE — Progress Notes (Signed)
PROGRESS NOTE    Yolanda Saunders  ZOX:[id number] DOB: [birth year] DOA: 08/02/2020 PCP: Johny BlamerHarris, William, MD    No chief complaint on file.   Brief Narrative:   Yolanda BenesRhonda B Saunders is a 75 y.o. female with medical history significant for CVA, hypertension, IBS, insulin-dependent type 2 diabetes, CKD stage IIIa, asthma and hyperlipidemia who presents with concerns of increasing confusion.   About 2 days ago has been began to note that she was having confusion.  She was confused about how to use a remote and her phone.  Also her speech was not making much sense.  She also noticed some changes to her vision although denies double vision.  Denies any focal extremity weakness.  She is mostly wheelchair-bound due to right hip pain and recently underwent lumbar microdiscectomy on 5/13.  Her right hip pain recently returned and she has repeat surgery scheduled for next week. Patient has recently been undergoing a lot of stress.  Her son recently passed and his funeral was last Friday which she missed as she was in the hospital.   Assessment & Plan:   Principal Problem:   Stroke [facility](HCC) Active Problems:   Hyperlipidemia   Type 2 diabetes mellitus with stage 3 chronic kidney disease, with long-term current use of insulin (HCC)   CKD (chronic kidney disease), stage III (HCC)   COVID-19 virus infection   Stroke (cerebrum) (HCC)   Acute CVA -MRI brain significant for moderate size acute right PCA distribution infarct, underlying moderate chronic microvascular ischemic disease, with few scattered remote lacunar infarcts in the basal ganglia and right thalamus -CTA head and neck significant for acute right P2 occlusion, with severe 75% stenosis in the right PCA, with severe distal left P2 stenosis. -Currently on aspirin and Plavix, recommendation for dual antiplatelet therapy for 8575-month, then Plavix alone -Lower extremities venous Dopplers negative for DVT -2D echo with EF 35 to 40% -Recommendation  for loop recorder to rule out A. fib, neurology discussed with Dr. Leward QuanGamitz, given her COVID-19 status, likely plan for loop recorder as an outpatient -LDL significantly elevated at 221 -A1c is elevated at 7.2 -PT/OT consulted    COVID virus infection -Patient is unvaccinated -Treated with remdesivir -No acute finding on chest x-ray  Cardiomyopathy with EF 35% -New diagnosis, concerning for LAD infarct -She is already on aspirin, Plavix and statin, plan for cardiac cath tomorrow.     Insulin-dependent type 2 diabetes Hemoglobin A1c of 9.1 on 03/2020 it is 7.2 this admission -Continue with insulin sliding scale   Hypertension - Allow for permissive hypertension during stroke work-up, goal to normalize in 2 to 3 days  Hyperlipidemia LDL is 221, likely his Lipitor from 40-80-   CKD stage IIIa - Creatinine stable   Hyperlipidemia - Continue statin  DVT prophylaxis: Lovenox Code Status: Full Family Communication: Discussed with husband at bedside Disposition:   Status is: inpatient  The patient will require care spanning > 2 midnights and should be moved to inpatient because: IV treatments appropriate due to intensity of illness or inability to take PO  Dispo: The patient is from: Home              Anticipated d/c is to: Home              Patient currently is not medically stable to d/c.     Difficult to place patient No       Consultants:  Neurology Cardiology  Subjective:  Remains with left hemianopsia, she denies any  new focal deficits, no chest pain, no shortness of breath, no fever.  Objective: Vitals:   08/05/20 0011 08/05/20 0400 08/05/20 0717 08/05/20 1136  BP: (!) 141/77 (!) 123/52 (!) 142/72 (!) 141/66  Pulse: 77 73 87   Resp: 18 16 20  (!) 21  Temp: 98.1 F (36.7 C) 98.2 F (36.8 C) 98.3 F (36.8 C) 98.1 F (36.7 C)  TempSrc: Oral Oral Oral Oral  SpO2: 99% 96% 99% 99%  Weight:      Height:        Intake/Output Summary (Last 24 hours) at  08/05/2020 1419 Last data filed at 08/05/2020 0100 Gross per 24 hour  Intake 340 ml  Output 400 ml  Net -60 ml   Filed Weights   08/03/20 0101  Weight: 83.1 kg    Examination:  Awake Alert, pleasant  symmetrical Chest wall movement, Good air movement bilaterally, CTAB RRR,No Gallops,Rubs or new Murmurs, No Parasternal Heave +ve B.Sounds, Abd Soft, No tenderness, No rebound - guarding or rigidity. No Cyanosis, Clubbing or edema, No new Rash or bruise      Data Reviewed: I have personally reviewed following labs and imaging studies  CBC: Recent Labs  Lab 08/02/20 1616 08/02/20 1654 08/04/20 0022  WBC 7.1  --  6.5  NEUTROABS 5.2  --   --   HGB 14.4 14.6 13.3  HCT 43.9 43.0 40.0  MCV 94.2  --  93.0  PLT 213  --  193    Basic Metabolic Panel: Recent Labs  Lab 08/02/20 1616 08/02/20 1654 08/04/20 0022  NA 136 138 138  K 3.8 3.8 3.3*  CL 102 104 105  CO2 23  --  27  GLUCOSE 141* 143* 115*  BUN 15 18 17   CREATININE 1.22* 1.20* 1.33*  CALCIUM 9.6  --  9.2    GFR: Estimated Creatinine Clearance: 36.8 mL/min (A) (by C-G formula based on SCr of 1.33 mg/dL (H)).  Liver Function Tests: Recent Labs  Lab 08/02/20 1616  AST 15  ALT 16  ALKPHOS 57  BILITOT 0.9  PROT 6.5  ALBUMIN 3.6    CBG: Recent Labs  Lab 08/04/20 1214 08/04/20 1636 08/04/20 2042 08/05/20 0725 08/05/20 1134  GLUCAP 149* 132* 151* 132* 157*     Recent Results (from the past 240 hour(s))  SARS CORONAVIRUS 2 (TAT 6-24 HRS) Nasopharyngeal Nasopharyngeal Swab     Status: Abnormal   Collection Time: 08/02/20  9:37 PM   Specimen: Nasopharyngeal Swab  Result Value Ref Range Status   SARS Coronavirus 2 POSITIVE (A) NEGATIVE Final    Comment: (NOTE) SARS-CoV-2 target nucleic acids are DETECTED.  The SARS-CoV-2 RNA is generally detectable in upper and lower respiratory specimens during the acute phase of infection. Positive results are indicative of the presence of SARS-CoV-2 RNA.  Clinical correlation with patient history and other diagnostic information is  necessary to determine patient infection status. Positive results do not rule out bacterial infection or co-infection with other viruses.  The expected result is Negative.  Fact Sheet for Patients: 08/07/20  Fact Sheet for Healthcare Providers: 08/04/20  This test is not yet approved or cleared by the [place] FDA and  has been authorized for detection and/or diagnosis of SARS-CoV-2 by FDA under an Emergency Use Authorization (EUA). This EUA will remain  in effect (meaning this test can be used) for the duration of the COVID-19 declaration under Section 564(b)(1) of the Act, 21 U. S.C. section 360bbb-3(b)(1), unless the authorization is terminated or  revoked sooner.   Performed at [facility], [address] [place], [place] [address]   MRSA Next Gen by PCR, Nasal     Status: None   Collection Time: 08/03/20  1:28 AM   Specimen: Nasal Mucosa; Nasal Swab  Result Value Ref Range Status   MRSA by PCR Next Gen NOT DETECTED NOT DETECTED Final    Comment: (NOTE) The GeneXpert MRSA Assay (FDA approved for NASAL specimens only), is one component of a comprehensive MRSA colonization surveillance program. It is not intended to diagnose MRSA infection nor to guide or monitor treatment for MRSA infections. Test performance is not FDA approved in patients less than 69 years old. Performed at [facility], [address], [place], [place] [address]          Radiology Studies: VAS [place] LOWER EXTREMITY VENOUS (DVT)  Result Date: 08/03/2020  Lower Venous DVT Study Patient Name:  TYRICA AFZAL  Date of Exam:   08/03/2020 Medical Rec #: [id number]          Accession #:    [id number] Date of Birth: Jun 13, 1945          Patient Gender: F Patient Age:   74Y Exam Location:  [facility] Procedure:      VAS [place] LOWER EXTREMITY  VENOUS (DVT) Referring Phys: [id number] Marvel Plan --------------------------------------------------------------------------------  Indications: Embolic stroke, [id number]-[id number].  Comparison Study: No prior studies. Performing Technologist: Jean Rosenthal RDMS,RVT  Examination Guidelines: A complete evaluation includes B-mode imaging, spectral Doppler, color Doppler, and power Doppler as needed of all accessible portions of each vessel. Bilateral testing is considered an integral part of a complete examination. Limited examinations for reoccurring indications may be performed as noted. The reflux portion of the exam is performed with the patient in reverse Trendelenburg.  +---------+---------------+---------+-----------+----------+--------------+ RIGHT    CompressibilityPhasicitySpontaneityPropertiesThrombus Aging +---------+---------------+---------+-----------+----------+--------------+ CFV      Full           Yes      Yes                                 +---------+---------------+---------+-----------+----------+--------------+ SFJ      Full                                                        +---------+---------------+---------+-----------+----------+--------------+ FV Prox  Full                                                        +---------+---------------+---------+-----------+----------+--------------+ FV Mid   Full                                                        +---------+---------------+---------+-----------+----------+--------------+ FV DistalFull                                                        +---------+---------------+---------+-----------+----------+--------------+  PFV      Full                                                        +---------+---------------+---------+-----------+----------+--------------+ POP      Full           Yes      Yes                                  +---------+---------------+---------+-----------+----------+--------------+ PTV      Full                                                        +---------+---------------+---------+-----------+----------+--------------+ PERO     Full                                                        +---------+---------------+---------+-----------+----------+--------------+ Gastroc  Full                                                        +---------+---------------+---------+-----------+----------+--------------+   +---------+---------------+---------+-----------+----------+--------------+ LEFT     CompressibilityPhasicitySpontaneityPropertiesThrombus Aging +---------+---------------+---------+-----------+----------+--------------+ CFV      Full           Yes      Yes                                 +---------+---------------+---------+-----------+----------+--------------+ SFJ      Full                                                        +---------+---------------+---------+-----------+----------+--------------+ FV Prox  Full                                                        +---------+---------------+---------+-----------+----------+--------------+ FV Mid   Full                                                        +---------+---------------+---------+-----------+----------+--------------+ FV DistalFull                                                        +---------+---------------+---------+-----------+----------+--------------+  PFV      Full                                                        +---------+---------------+---------+-----------+----------+--------------+ POP      Full           Yes      Yes                                 +---------+---------------+---------+-----------+----------+--------------+ PTV      Full                                                         +---------+---------------+---------+-----------+----------+--------------+ PERO     Full                                                        +---------+---------------+---------+-----------+----------+--------------+ Gastroc  Full                                                        +---------+---------------+---------+-----------+----------+--------------+     Summary: RIGHT: - There is no evidence of deep vein thrombosis in the lower extremity.  - No cystic structure found in the popliteal fossa.  LEFT: - There is no evidence of deep vein thrombosis in the lower extremity.  - No cystic structure found in the popliteal fossa.  *See table(s) above for measurements and observations. Electronically signed by Sherald Hess MD on 08/03/2020 at 6:38:36 PM.    Final         Scheduled Meds:  aspirin EC  81 mg Oral Daily   atorvastatin  80 mg Oral Daily   clopidogrel  75 mg Oral Daily   enoxaparin (LOVENOX) injection  40 mg Subcutaneous Q24H   insulin aspart  0-9 Units Subcutaneous TID PC & HS   Continuous Infusions:  remdesivir 100 mg in NS 100 mL 100 mg (08/05/20 0822)     LOS: 2 days       Huey Bienenstock, MD [facility]   To contact the attending provider between 7A-7P or the covering provider during after hours 7P-7A, please log into the web site [email or web address] and access using universal [place] password for that web site. If you do not have the password, please call the hospital operator.  08/05/2020, 2:19 PM

## 2020-08-05 NOTE — Progress Notes (Signed)
Progress Note  Patient Name: Yolanda Saunders Date of Encounter: 08/05/2020  [facility] Cardiologist: None  Subjective   NAEO. AAO x 4 this AM.  Inpatient Medications    Scheduled Meds:  aspirin EC  81 mg Oral Daily   atorvastatin  80 mg Oral Daily   clopidogrel  75 mg Oral Daily   enoxaparin (LOVENOX) injection  40 mg Subcutaneous Q24H   HYDROcodone-acetaminophen  1 tablet Oral Once   insulin aspart  0-9 Units Subcutaneous TID PC & HS   Continuous Infusions:  remdesivir 100 mg in NS 100 mL 100 mg (08/05/20 0822)   PRN Meds: acetaminophen **OR** acetaminophen (TYLENOL) oral liquid 160 mg/5 mL **OR** acetaminophen, senna-docusate   Vital Signs    Vitals:   08/04/20 2000 08/05/20 0011 08/05/20 0400 08/05/20 0717  BP: (!) 157/75 (!) 141/77 (!) 123/52 (!) 142/72  Pulse:  77 73 87  Resp:  18 16 20   Temp:  98.1 F (36.7 C) 98.2 F (36.8 C) 98.3 F (36.8 C)  TempSrc:  Oral Oral Oral  SpO2:  99% 96% 99%  Weight:      Height:        Intake/Output Summary (Last 24 hours) at 08/05/2020 0836 Last data filed at 08/05/2020 0100 Gross per 24 hour  Intake 340 ml  Output 400 ml  Net -60 ml   Last 3 Weights 08/03/2020 06/01/2020 05/30/2020  Weight (lbs) 183 lb 3.2 oz 196 lb 196 lb  Weight (kg) 83.1 kg 88.905 kg 88.905 kg      Telemetry    Sinus rhythm - Personally Reviewed  ECG    No new - Personally Reviewed  Physical Exam   GEN: No acute distress.   Neck: No JVD Cardiac: RRR, no murmurs, rubs, or gallops.  Respiratory: Clear to auscultation bilaterally. GI: Soft, nontender, non-distended  MS: No edema; No deformity. Neuro:  Nonfocal  Psych: Normal affect   Labs    High Sensitivity Troponin:  No results for input(s): TROPONINIHS in the last 720 hours.    Chemistry Recent Labs  Lab 08/02/20 1616 08/02/20 1654 08/04/20 0022  NA 136 138 138  K 3.8 3.8 3.3*  CL 102 104 105  CO2 23  --  27  GLUCOSE 141* 143* 115*  BUN 15 18 17   CREATININE 1.22*  1.20* 1.33*  CALCIUM 9.6  --  9.2  PROT 6.5  --   --   ALBUMIN 3.6  --   --   AST 15  --   --   ALT 16  --   --   ALKPHOS 57  --   --   BILITOT 0.9  --   --   GFRNONAA 46*  --  41*  ANIONGAP 11  --  6     Hematology Recent Labs  Lab 08/02/20 1616 08/02/20 1654 08/04/20 0022  WBC 7.1  --  6.5  RBC 4.66  --  4.30  HGB 14.4 14.6 13.3  HCT 43.9 43.0 40.0  MCV 94.2  --  93.0  MCH 30.9  --  30.9  MCHC 32.8  --  33.3  RDW 14.0  --  14.2  PLT 213  --  193    BNPNo results for input(s): BNP, PROBNP in the last 168 hours.   DDimer No results for input(s): DDIMER in the last 168 hours.   Radiology    DG Chest Port 1 View  Result Date: 08/03/2020 CLINICAL DATA:  COVID. EXAM: PORTABLE CHEST  1 VIEW COMPARISON:  Chest x-ray dated April 16, 2011. FINDINGS: The heart size and mediastinal contours are within normal limits. Both lungs are clear. The visualized skeletal structures are unremarkable. IMPRESSION: No active disease. Electronically Signed   By: Obie Dredge M.D.   On: 08/03/2020 14:48   ECHOCARDIOGRAM COMPLETE  Result Date: 08/03/2020    ECHOCARDIOGRAM REPORT   Patient Name:   ROZELLE CAUDLE Date of Exam: 08/03/2020 Medical Rec #:  [id number]         Height:       63.0 in Accession #:    [id number]        Weight:       183.2 lb Date of Birth:  1945-07-23         BSA:          1.863 m Patient Age:    74 years          BP:           138/92 mmHg Patient Gender: F                 HR:           71 bpm. Exam Location:  Inpatient Procedure: 2D Echo, Cardiac Doppler and Color Doppler Indications:    Stroke  History:        Patient has prior history of Echocardiogram examinations, most                 recent 10/27/2016. Stroke; Risk Factors:Former Smoker, Diabetes,                 Dyslipidemia and Hypertension.  Sonographer:    Shirlean Kelly Referring Phys: [id number] CHING T TU IMPRESSIONS  1. No left ventricular thrombus is seen with Definity contrast. Left ventricular ejection  fraction, by estimation, is 35 to 40%. The left ventricle has moderately decreased function. The left ventricle has no regional wall motion abnormalities. Left ventricular diastolic parameters are consistent with Grade I diastolic dysfunction (impaired relaxation). There is severe hypokinesis of the left ventricular, mid-apical anteroseptal wall and anterior wall. There is mild dyskinesis of the left ventricular, entire apical segment.  2. Right ventricular systolic function is normal. The right ventricular size is normal. Tricuspid regurgitation signal is inadequate for assessing PA pressure.  3. The mitral valve is normal in structure. Trivial mitral valve regurgitation. No evidence of mitral stenosis.  4. The aortic valve is normal in structure. Aortic valve regurgitation is mild. No aortic stenosis is present.  5. The inferior vena cava is normal in size with greater than 50% respiratory variability, suggesting right atrial pressure of 3 mmHg. Comparison(s): Prior images unable to be directly viewed, comparison made by report only. Changes from prior study are noted. The left ventricular function is significantly worse. The left ventricular wall motion abnormalities are new. FINDINGS  Left Ventricle: No left ventricular thrombus is seen with Definity contrast. Left ventricular ejection fraction, by estimation, is 35 to 40%. The left ventricle has moderately decreased function. The left ventricle has no regional wall motion abnormalities. Severe hypokinesis of the left ventricular, mid-apical anteroseptal wall and anterior wall. Mild dyskinesis of the left ventricular, entire apical segment. The left ventricular internal cavity size was normal in size. There is no left ventricular hypertrophy. Left ventricular diastolic parameters are consistent with Grade I diastolic dysfunction (impaired relaxation).  LV Wall Scoring: The apical lateral segment, apical septal segment, apical inferior segment, and apex are  dyskinetic. The mid and  distal anterior wall and mid anteroseptal segment are hypokinetic. Right Ventricle: The right ventricular size is normal. No increase in right ventricular wall thickness. Right ventricular systolic function is normal. Tricuspid regurgitation signal is inadequate for assessing PA pressure. Left Atrium: Left atrial size was normal in size. Right Atrium: Right atrial size was normal in size. Pericardium: There is no evidence of pericardial effusion. Mitral Valve: The mitral valve is normal in structure. Trivial mitral valve regurgitation. No evidence of mitral valve stenosis. Tricuspid Valve: The tricuspid valve is normal in structure. Tricuspid valve regurgitation is not demonstrated. No evidence of tricuspid stenosis. Aortic Valve: The aortic valve is normal in structure. Aortic valve regurgitation is mild. No aortic stenosis is present. Aortic valve mean gradient measures 5.0 mmHg. Aortic valve peak gradient measures 9.2 mmHg. Aortic valve area, by VTI measures 1.81 cm. Pulmonic Valve: The pulmonic valve was normal in structure. Pulmonic valve regurgitation is not visualized. No evidence of pulmonic stenosis. Aorta: The aortic root is normal in size and structure. Venous: The inferior vena cava is normal in size with greater than 50% respiratory variability, suggesting right atrial pressure of 3 mmHg. IAS/Shunts: No atrial level shunt detected by color flow Doppler.  LEFT VENTRICLE PLAX 2D LVIDd:         5.10 cm  Diastology LVIDs:         3.70 cm  LV e' medial:    4.57 cm/s LV PW:         1.00 cm  LV E/e' medial:  14.4 LV IVS:        1.10 cm  LV e' lateral:   5.11 cm/s LVOT diam:     1.80 cm  LV E/e' lateral: 12.9 LV SV:         54 LV SV Index:   29 LVOT Area:     2.54 cm  RIGHT VENTRICLE          IVC RV Basal diam:  3.20 cm  IVC diam: 1.00 cm LEFT ATRIUM             Index       RIGHT ATRIUM           Index LA diam:        3.50 cm 1.88 cm/m  RA Area:     14.50 cm LA Vol (A2C):   36.7 ml  19.70 ml/m RA Volume:   34.60 ml  18.57 ml/m LA Vol (A4C):   43.0 ml 23.08 ml/m LA Biplane Vol: 40.6 ml 21.79 ml/m  AORTIC VALVE AV Area (Vmax):    1.74 cm AV Area (Vmean):   1.51 cm AV Area (VTI):     1.81 cm AV Vmax:           152.00 cm/s AV Vmean:          105.000 cm/s AV VTI:            0.296 m AV Peak Grad:      9.2 mmHg AV Mean Grad:      5.0 mmHg LVOT Vmax:         104.00 cm/s LVOT Vmean:        62.200 cm/s LVOT VTI:          0.211 m LVOT/AV VTI ratio: 0.71  AORTA Ao Root diam: 3.20 cm Ao Asc diam:  3.40 cm MITRAL VALVE MV Area (PHT): 4.49 cm     SHUNTS MV Decel Time: 169 msec     Systemic VTI:  0.21 m MV E velocity: 65.70 cm/s   Systemic Diam: 1.80 cm MV A velocity: 110.00 cm/s MV E/A ratio:  0.60 Mihai Croitoru MD Electronically signed by Thurmon FairMihai Croitoru MD Signature Date/Time: 08/03/2020/12:28:56 PM    Final    VAS US LOWER EXTREMITY VENOUS (DVT)  Result Date: 08/03/2020  Lower Venous DVT Study Patient Name:  Laural BenesRHONDA B Fisk  Date of Exam:   08/03/2020 Medical Rec #: [id number]          Accession #:    [id number] Date of Birth: [birth year]          Patient Gender: F Patient Age:   74Y [facility]am [facility] Procedure:      VAS US LOWER EXTREMITY VENOUS (DVT) Referring Phys: [id number] Marvel PlanJINDONG XU --------------------------------------------------------------------------------  Indications: Embolic stroke, [id number].  Comparison Study: No prior studies. Performing Technologist: Jean Rosenthalachel Hodge RDMS,RVT  Examination Guidelines: A complete evaluation includes B-mode imaging, spectral Doppler, color Doppler, and power Doppler as needed of all accessible portions of each vessel. Bilateral testing is considered an integral part of a complete examination. Limited examinations for reoccurring indications may be performed as noted. The reflux portion of the exam is performed with the patient in reverse Trendelenburg.  +---------+---------------+---------+-----------+----------+--------------+ RIGHT     CompressibilityPhasicitySpontaneityPropertiesThrombus Aging +---------+---------------+---------+-----------+----------+--------------+ CFV      Full           Yes      Yes                                 +---------+---------------+---------+-----------+----------+--------------+ SFJ      Full                                                        +---------+---------------+---------+-----------+----------+--------------+ FV Prox  Full                                                        +---------+---------------+---------+-----------+----------+--------------+ FV Mid   Full                                                        +---------+---------------+---------+-----------+----------+--------------+ FV DistalFull                                                        +---------+---------------+---------+-----------+----------+--------------+ PFV      Full                                                        +---------+---------------+---------+-----------+----------+--------------+ POP      Full  Yes      Yes                                 +---------+---------------+---------+-----------+----------+--------------+ PTV      Full                                                        +---------+---------------+---------+-----------+----------+--------------+ PERO     Full                                                        +---------+---------------+---------+-----------+----------+--------------+ Gastroc  Full                                                        +---------+---------------+---------+-----------+----------+--------------+   +---------+---------------+---------+-----------+----------+--------------+ LEFT     CompressibilityPhasicitySpontaneityPropertiesThrombus Aging +---------+---------------+---------+-----------+----------+--------------+ CFV      Full           Yes      Yes                                  +---------+---------------+---------+-----------+----------+--------------+ SFJ      Full                                                        +---------+---------------+---------+-----------+----------+--------------+ FV Prox  Full                                                        +---------+---------------+---------+-----------+----------+--------------+ FV Mid   Full                                                        +---------+---------------+---------+-----------+----------+--------------+ FV DistalFull                                                        +---------+---------------+---------+-----------+----------+--------------+ PFV      Full                                                        +---------+---------------+---------+-----------+----------+--------------+ POP  Full           Yes      Yes                                 +---------+---------------+---------+-----------+----------+--------------+ PTV      Full                                                        +---------+---------------+---------+-----------+----------+--------------+ PERO     Full                                                        +---------+---------------+---------+-----------+----------+--------------+ Gastroc  Full                                                        +---------+---------------+---------+-----------+----------+--------------+     Summary: RIGHT: - There is no evidence of deep vein thrombosis in the lower extremity.  - No cystic structure found in the popliteal fossa.  LEFT: - There is no evidence of deep vein thrombosis in the lower extremity.  - No cystic structure found in the popliteal fossa.  *See table(s) above for measurements and observations. Electronically signed by Sherald Hess MD on 08/03/2020 at 6:38:36 PM.    Final     Cardiac Studies   No new   Assessment & Plan    74yo  pleasant female a/w with covid and new LV dysfunction.  #Acute heart failure EF35% WMA concerning for LAD infarct Dyskinesis of the apex concerning as possible cause of embolic stroke LHC early this week Reported allergies to BB/ARB NPO Sunday night in case she gets LHC monday  #Covid 19 -remdesivir per IM  #Stroke Cont statin  For questions or updates, please contact [facility] Please consult [email or web address] for contact info under        Signed, Lanier Prude, MD  08/05/2020, 8:36 AM

## 2020-08-05 NOTE — Evaluation (Signed)
Speech Language Pathology Evaluation Patient Details Name: Yolanda Saunders MRN: [id number] DOB: 08-Feb-1945 Today's Date: 08/05/2020 Time: 5366-4403 SLP Time Calculation (min) (ACUTE ONLY): 22 min  Problem List:  Patient Active Problem List   Diagnosis Date Noted   COVID-19 virus infection 08/03/2020   Stroke (cerebrum) ([place]) 08/03/2020   Stroke ([place]) 08/02/2020   Lumbar radiculopathy 06/01/2020   Plantar fasciitis 06/03/2017   Syncope 10/15/2016   CKD (chronic kidney disease), stage III ([place]) 10/15/2016   Type 2 diabetes mellitus with stage 3 chronic kidney disease, with long-term current use of insulin ([place]) 08/07/2015   Bilateral leg weakness 06/06/2014   Abnormality of gait 06/06/2014   Lumbosacral stenosis 06/06/2014   Chest pain 04/16/2011   Hyperlipidemia 09/13/2008   Class 1 obesity 09/13/2008   Essential hypertension 09/13/2008   CVA 09/13/2008   ASTHMA 09/13/2008   IRRITABLE BOWEL SYNDROME 09/13/2008   GALLSTONES 09/13/2008   UTI 09/13/2008   ARTHRITIS 09/13/2008   FIBROMYALGIA 09/13/2008   HEADACHE, CHRONIC 09/13/2008   Past Medical History:  Past Medical History:  Diagnosis Date   Anemia 04/17/11   "long, long years ago"   Angina 04/16/11   "that's what I'm here for"   Anxiety    Arthritis    Hips and knees   Asthma    Carpal tunnel syndrome    Cataracts, bilateral    CKD (chronic kidney disease), stage III ([place])    Complication of anesthesia 1987   "affected my eyes; couldn't see anything but blurrs even the next day"   CVA (cerebral infarction)    After cardiac catheter 02/2000   Depression    Edema    Fibromyalgia    GERD (gastroesophageal reflux disease)    Headache(784.0)    High cholesterol    History of bronchitis    Hypertension    IBS (irritable bowel syndrome)    Migraines    Panic attacks 04/17/11   "don't take anything for it"   Pneumonia 04/17/11   "probably as many as 7 times"   Renal disorder 04/17/11   "they are working at 60%  capacity"   Shortness of breath on exertion    "cause of my asthma"   Stroke ([place]) 2002   residual "problem w/using the right word, left 5 lesions on my brain/MRI; long term memory loss"   Type II diabetes mellitus ([place])    Past Surgical History:  Past Surgical History:  Procedure Laterality Date   CARDIAC CATHETERIZATION  2002   CARPAL TUNNEL RELEASE  2003-2010   "twice on left; once on right"   [place]  2010   EYE SURGERY Bilateral 11/2019   Lenses Implant   histerectomy     LUMBAR LAMINECTOMY/ DECOMPRESSION WITH MET-RX Right 06/01/2020   Procedure: Right Lumbar Four-Five Minimally invasive discectomy;  Surgeon: Judith Part, MD;  Location: [facility];  Service: Neurosurgery;  Laterality: Right;   VAGINAL HYSTERECTOMY  1977   HPI:  Pt is a 75 y/o female admitted 08/02/20 with increased confusion, speech changes, and visual changes. MRI brain:  Moderate-sized acute right PCA distribution infarct. Pt found to be COVID+. PMH: CKD, CVA, fibromyalgia, HTN, IBS, panic attacks, CVA, DM, lumbar surgery 5/22   Assessment / Plan / Recommendation Clinical Impression  Pt participated in speech/language/cognition evaluation. She reported that she has a high-school education and is a retired Emergency planning/management officer.  Pt denied any baseline deficits in speech, language, or cognition and stated that she  was independent with medication and financial management prior to admission. Per the pt, she currently has difficulty with memory, "logical thinking", and attending for more than three minutes. The [facility] Mental Status Examination was completed to evaluate the pt's cognitive-linguistic skills. She achieved a score of 13/30 which is below the normal limits of 27 or more out of 30. She exhibited difficulty in the areas of attention, memory, and executive function. Visual deficits were noted during tasks which required writing and left homonymous hemianopsia was  identified by OT. Her speech and language skills were WNL. Skilled SLP services are clinically indicated at this time to improve cognitive-linguistic function.    SLP Assessment  SLP Recommendation/Assessment: Patient needs continued Speech Lanaguage Pathology Services SLP Visit Diagnosis: Cognitive communication deficit (R41.841)    Follow Up Recommendations  Home health SLP    Frequency and Duration min 2x/week  2 weeks      SLP Evaluation Cognition  Overall Cognitive Status: Impaired/Different from baseline Arousal/Alertness: Awake/alert Orientation Level: Oriented to person;Oriented to situation;Oriented to place;Disoriented to time (Month: October/November not sure of date; Day: Tuesday) Attention: Focused;Sustained Focused Attention: Impaired Focused Attention Impairment: Verbal complex Sustained Attention: Impaired Sustained Attention Impairment: Verbal complex Memory: Impaired Memory Impairment: Retrieval deficit;Decreased recall of new information (Immediate: 5/5; delayed: 0/5; with cues: 4/5) Awareness: Appears intact Problem Solving: Appears intact Executive Function: Sequencing;Organizing Sequencing: Impaired Sequencing Impairment: Verbal complex (Clock drawing: 0/4) Organizing: Impaired Organizing Impairment: Verbal complex (Backward digit span: 1/2)       Comprehension  Auditory Comprehension Overall Auditory Comprehension: Appears within functional limits for tasks assessed Yes/No Questions: Within Functional Limits Commands: Within Functional Limits Conversation: Complex    Expression Expression Primary Mode of Expression: Verbal Verbal Expression Overall Verbal Expression: Appears within functional limits for tasks assessed Initiation: No impairment Level of Generative/Spontaneous Verbalization: Conversation Repetition: No impairment   Oral / Motor  Oral Motor/Sensory Function Overall Oral Motor/Sensory Function: Within functional limits Motor  Speech Overall Motor Speech: Appears within functional limits for tasks assessed Respiration: Within functional limits Phonation: Normal Resonance: Within functional limits Intelligibility: Intelligible Motor Planning: Witnin functional limits Motor Speech Errors: Not applicable   Leitha Hyppolite I. Hardin Negus, [place], [place] Office number [phone] Pager [place] 08/05/2020, 5:18 PM

## 2020-08-06 DIAGNOSIS — N1831 Chronic kidney disease, stage 3a: Secondary | ICD-10-CM

## 2020-08-06 DIAGNOSIS — U071 COVID-19: Secondary | ICD-10-CM

## 2020-08-06 DIAGNOSIS — E785 Hyperlipidemia, unspecified: Secondary | ICD-10-CM

## 2020-08-06 DIAGNOSIS — I63431 Cerebral infarction due to embolism of right posterior cerebral artery: Principal | ICD-10-CM

## 2020-08-06 LAB — GLUCOSE, CAPILLARY
Glucose-Capillary: 128 mg/dL — ABNORMAL HIGH (ref 70–99)
Glucose-Capillary: 135 mg/dL — ABNORMAL HIGH (ref 70–99)
Glucose-Capillary: 116 mg/dL — ABNORMAL HIGH (ref 70–99)
Glucose-Capillary: 149 mg/dL — ABNORMAL HIGH (ref 70–99)

## 2020-08-06 LAB — BASIC METABOLIC PANEL
Anion gap: 10 (ref 5–15)
BUN: 16 mg/dL (ref 8–23)
CO2: 24 mmol/L (ref 22–32)
Calcium: 9.6 mg/dL (ref 8.9–10.3)
Chloride: 104 mmol/L (ref 98–111)
Creatinine, Ser: 1.25 mg/dL — ABNORMAL HIGH (ref 0.44–1.00)
GFR, Estimated: 45 mL/min — ABNORMAL LOW (ref >60)
Glucose, Bld: 150 mg/dL — ABNORMAL HIGH (ref 70–99)
Potassium: 4.3 mmol/L (ref 3.5–5.1)
Sodium: 138 mmol/L (ref 135–145)

## 2020-08-06 MED ORDER — SODIUM CHLORIDE 0.9 % IV SOLN: 250.0000 mL | INTRAVENOUS | Status: DC | PRN

## 2020-08-06 MED ORDER — ASPIRIN 81 MG PO CHEW
81.0000 mg | CHEWABLE_TABLET | ORAL | Status: AC
  Administered 2020-08-06: 81 mg via ORAL
  Filled 2020-08-06: qty 1

## 2020-08-06 MED ORDER — SODIUM CHLORIDE 0.9% FLUSH: 3.0000 mL | INTRAVENOUS | Status: DC | PRN

## 2020-08-06 MED ORDER — SODIUM CHLORIDE 0.9 % IV SOLN: INTRAVENOUS | Status: DC

## 2020-08-06 MED ORDER — SODIUM CHLORIDE 0.9% FLUSH
3.0000 mL | Freq: Two times a day (BID) | INTRAVENOUS | Status: DC
  Administered 2020-08-06 (×2): 3 mL via INTRAVENOUS

## 2020-08-06 NOTE — Progress Notes (Signed)
   Patient was scheduled for cardiac catheterization for further evaluation an new cardiomyopathy with reduced EF. Unfortunately, due to staffing shortage and bed availability, unable to perform today. Will move procedure to tomorrow. She will be scheduled for 2nd case. Will allow patient to eat and make her NPO at midnight. Called and updated patient, husband, and Charity fundraiser.  Corrin Parker, PA-C 08/06/2020 4:15 PM

## 2020-08-06 NOTE — Progress Notes (Signed)
PROGRESS NOTE    [name]  NTI:[id number] DOB: 09-Mar-1945 DOA: 08/02/2020 PCP: Johny Blamer, MD    No chief complaint on file.   Brief Narrative:   Yolanda Saunders is a 75 y.o. female with medical history significant for CVA, hypertension, IBS, insulin-dependent type 2 diabetes, CKD stage IIIa, asthma and hyperlipidemia who presents with concerns of increasing confusion.   About 2 days ago has been began to note that she was having confusion.  She was confused about how to use a remote and her phone.  Also her speech was not making much sense.  She also noticed some changes to her vision although denies double vision.  Denies any focal extremity weakness.  She is mostly wheelchair-bound due to right hip pain and recently underwent lumbar microdiscectomy on 5/13.  Her right hip pain recently returned and she has repeat surgery scheduled for next week. Patient has recently been undergoing a lot of stress.  Her son recently passed and his funeral was last Friday which she missed as she was in the hospital.   Assessment & Plan:   Principal Problem:   Stroke [facility] Active Problems:   Hyperlipidemia   Type 2 diabetes mellitus with stage 3 chronic kidney disease, with long-term current use of insulin (HCC)   CKD (chronic kidney disease), stage III (HCC)   COVID-19 virus infection   Stroke (cerebrum) (HCC)   Acute CVA -MRI brain significant for moderate size acute right PCA distribution infarct, underlying moderate chronic microvascular ischemic disease, with few scattered remote lacunar infarcts in the basal ganglia and right thalamus -CTA head and neck significant for acute right P2 occlusion, with severe 75% stenosis in the right PCA, with severe distal left P2 stenosis. -Currently on aspirin and Plavix, recommendation for dual antiplatelet therapy for 73-month, then Plavix alone -Lower extremities venous Dopplers negative for DVT -2D echo with EF 35 to 40% -Recommendation  for loop recorder to rule out A. fib, neurology discussed with Dr. Leward Quan, given her COVID-19 status, likely plan for loop recorder as an outpatient -LDL significantly elevated at 221 -A1c is elevated at 7.2 -PT/OT consulted  COVID virus infection -Patient is unvaccinated -Treated with remdesivir -No acute finding on chest x-ray  Cardiomyopathy with EF 35% -New diagnosis, concerning for LAD infarct -She is already on aspirin, Plavix and statin,  -Plan for cardiac cath today -With multiple allergies limiting treatment options, allergy list including spironolactone, metoprolol, amlodipine, telmisartan, losartan, and lisinopril.     Insulin-dependent type 2 diabetes Hemoglobin A1c of 9.1 on 03/2020 it is 7.2 this admission -Continue with insulin sliding scale   Hypertension - Allow for permissive hypertension during stroke work-up, goal to normalize in 2 to 3 days  Hyperlipidemia LDL is 221, likely his Lipitor from 40-80-   CKD stage IIIa - Creatinine stable   Hyperlipidemia - Continue statin  DVT prophylaxis: Lovenox Code Status: Full Family Communication: Discussed with husband at bedside Disposition:   Status is: inpatient  The patient will require care spanning > 2 midnights and should be moved to inpatient because: IV treatments appropriate due to intensity of illness or inability to take PO  Dispo: The patient is from: Home              Anticipated d/c is to: Home              Patient currently is not medically stable to d/c.     Difficult to place patient No  Consultants:  Neurology Cardiology  Subjective:  She still reports visual deficits with no significant change, she denies any new focal deficits, denies chest pain or shortness of breath, no new tingling or numbness.     Objective: Vitals:   08/05/20 2303 08/06/20 0505 08/06/20 0748 08/06/20 1121  BP: (!) 153/82 (!) 156/87 (!) 152/74 (!) 160/74  Pulse: 86 76 85 82  Resp: 14 17 18 20    Temp: 98.3 F (36.8 C) 97.9 F (36.6 C) 98.2 F (36.8 C) 99 F (37.2 C)  TempSrc: Oral Oral Oral Oral  SpO2: 95% 97% 94% 95%  Weight:      Height:       No intake or output data in the 24 hours ending 08/06/20 1407  Filed Weights   08/03/20 0101  Weight: 83.1 kg    Examination:  Awake Alert, does not, answering questions appropriately, Symmetrical Chest wall movement, Good air movement bilaterally, CTAB RRR,No Gallops,Rubs or new Murmurs, No Parasternal Heave +ve B.Sounds, Abd Soft, No tenderness, No rebound - guarding or rigidity. No Cyanosis, Clubbing or edema, No new Rash or bruise       Data Reviewed: I have personally reviewed following labs and imaging studies  CBC: Recent Labs  Lab 08/02/20 1616 08/02/20 1654 08/04/20 0022  WBC 7.1  --  6.5  NEUTROABS 5.2  --   --   HGB 14.4 14.6 13.3  HCT 43.9 43.0 40.0  MCV 94.2  --  93.0  PLT 213  --  193    Basic Metabolic Panel: Recent Labs  Lab 08/02/20 1616 08/02/20 1654 08/04/20 0022 08/06/20 1103  NA 136 138 138 138  K 3.8 3.8 3.3* 4.3  CL 102 104 105 104  CO2 23  --  27 24  GLUCOSE 141* 143* 115* 150*  BUN 15 18 17 16   CREATININE 1.22* 1.20* 1.33* 1.25*  CALCIUM 9.6  --  9.2 9.6    GFR: Estimated Creatinine Clearance: 39.1 mL/min (A) (by C-G formula based on SCr of 1.25 mg/dL (H)).  Liver Function Tests: Recent Labs  Lab 08/02/20 1616  AST 15  ALT 16  ALKPHOS 57  BILITOT 0.9  PROT 6.5  ALBUMIN 3.6    CBG: Recent Labs  Lab 08/05/20 1134 08/05/20 1637 08/05/20 2027 08/06/20 0749 08/06/20 1122  GLUCAP 157* 147* 186* 128* 135*     Recent Results (from the past 240 hour(s))  SARS CORONAVIRUS 2 (TAT 6-24 HRS) Nasopharyngeal Nasopharyngeal Swab     Status: Abnormal   Collection Time: 08/02/20  9:37 PM   Specimen: Nasopharyngeal Swab  Result Value Ref Range Status   SARS Coronavirus 2 POSITIVE (A) NEGATIVE Final    Comment: (NOTE) SARS-CoV-2 target nucleic acids are  DETECTED.  The SARS-CoV-2 RNA is generally detectable in upper and lower respiratory specimens during the acute phase of infection. Positive results are indicative of the presence of SARS-CoV-2 RNA. Clinical correlation with patient history and other diagnostic information is  necessary to determine patient infection status. Positive results do not rule out bacterial infection or co-infection with other viruses.  The expected result is Negative.  Fact Sheet for Patients: 08/08/20  Fact Sheet for Healthcare Providers: 08/04/20  This test is not yet approved or cleared by the [place] FDA and  has been authorized for detection and/or diagnosis of SARS-CoV-2 by FDA under an Emergency Use Authorization (EUA). This EUA will remain  in effect (meaning this test can be used) for the duration of the COVID-19  declaration under Section 564(b)(1) of the Act, 21 U. S.C. section 360bbb-3(b)(1), unless the authorization is terminated or revoked sooner.   Performed at [facility], [address], [place], [place] [address]   MRSA Next Gen by PCR, Nasal     Status: None   Collection Time: 08/03/20  1:28 AM   Specimen: Nasal Mucosa; Nasal Swab  Result Value Ref Range Status   MRSA by PCR Next Gen NOT DETECTED NOT DETECTED Final    Comment: (NOTE) The GeneXpert MRSA Assay (FDA approved for NASAL specimens only), is one component of a comprehensive MRSA colonization surveillance program. It is not intended to diagnose MRSA infection nor to guide or monitor treatment for MRSA infections. Test performance is not FDA approved in patients less than 72 years old. Performed at [facility], [address] [place], [place] [address]          Radiology Studies: No results found.      Scheduled Meds:  aspirin EC  81 mg Oral Daily   atorvastatin  80 mg Oral Daily   clopidogrel  75 mg Oral Daily    enoxaparin (LOVENOX) injection  40 mg Subcutaneous Q24H   insulin aspart  0-9 Units Subcutaneous TID PC & HS   pantoprazole  40 mg Oral Daily   sodium chloride flush  3 mL Intravenous Q12H   Continuous Infusions:  sodium chloride     sodium chloride 50 mL/hr at 08/06/20 9326   remdesivir 100 mg in NS 100 mL 100 mg (08/06/20 1027)     LOS: 3 days       Huey Bienenstock, MD [facility]   To contact the attending provider between 7A-7P or the covering provider during after hours 7P-7A, please log into the web site [email or web address] and access using universal [place] password for that web site. If you do not have the password, please call the hospital operator.  08/06/2020, 2:07 PM

## 2020-08-06 NOTE — H&P (View-Only) (Signed)
Progress Note  Patient Name: Yolanda Saunders Date of Encounter: 08/06/2020  [facility] Cardiologist: Armanda Magic, MD   Subjective   Feeling well.  Complains of chronic hip pain.   Inpatient Medications    Scheduled Meds:  aspirin EC  81 mg Oral Daily   atorvastatin  80 mg Oral Daily   clopidogrel  75 mg Oral Daily   enoxaparin (LOVENOX) injection  40 mg Subcutaneous Q24H   insulin aspart  0-9 Units Subcutaneous TID PC & HS   pantoprazole  40 mg Oral Daily   Continuous Infusions:  sodium chloride 50 mL/hr at 08/06/20 0743   remdesivir 100 mg in NS 100 mL 100 mg (08/05/20 0822)   PRN Meds: acetaminophen **OR** acetaminophen (TYLENOL) oral liquid 160 mg/5 mL **OR** acetaminophen, senna-docusate   Vital Signs    Vitals:   08/05/20 2018 08/05/20 2303 08/06/20 0505 08/06/20 0748  BP: (!) 160/73 (!) 153/82 (!) 156/87 (!) 152/74  Pulse: 85 86 76 85  Resp: 17 14 17 18   Temp: 98 F (36.7 C) 98.3 F (36.8 C) 97.9 F (36.6 C) 98.2 F (36.8 C)  TempSrc: Oral Oral Oral Oral  SpO2: 97% 95% 97% 94%  Weight:      Height:       No intake or output data in the 24 hours ending 08/06/20 0948 Last 3 Weights 08/03/2020 06/01/2020 05/30/2020  Weight (lbs) 183 lb 3.2 oz 196 lb 196 lb  Weight (kg) 83.1 kg 88.905 kg 88.905 kg      Telemetry    Sinus rhythm.   - Personally Reviewed  ECG    N/a - Personally Reviewed  Physical Exam   GEN: No acute distress.   Neck: No JVD Cardiac: RRR, no murmurs, rubs, or gallops.  Respiratory: Clear to auscultation bilaterally. GI: Soft, nontender, non-distended  MS: No edema; No deformity. Neuro:  Nonfocal  Psych: Normal affect   Labs    High Sensitivity Troponin:  No results for input(s): TROPONINIHS in the last 720 hours.    Chemistry Recent Labs  Lab 08/02/20 1616 08/02/20 1654 08/04/20 0022  NA 136 138 138  K 3.8 3.8 3.3*  CL 102 104 105  CO2 23  --  27  GLUCOSE 141* 143* 115*  BUN 15 18 17   CREATININE 1.22* 1.20*  1.33*  CALCIUM 9.6  --  9.2  PROT 6.5  --   --   ALBUMIN 3.6  --   --   AST 15  --   --   ALT 16  --   --   ALKPHOS 57  --   --   BILITOT 0.9  --   --   GFRNONAA 46*  --  41*  ANIONGAP 11  --  6     Hematology Recent Labs  Lab 08/02/20 1616 08/02/20 1654 08/04/20 0022  WBC 7.1  --  6.5  RBC 4.66  --  4.30  HGB 14.4 14.6 13.3  HCT 43.9 43.0 40.0  MCV 94.2  --  93.0  MCH 30.9  --  30.9  MCHC 32.8  --  33.3  RDW 14.0  --  14.2  PLT 213  --  193    BNPNo results for input(s): BNP, PROBNP in the last 168 hours.   DDimer No results for input(s): DDIMER in the last 168 hours.   Radiology    No results found.  Cardiac Studies   Echo 08/03/20: 1. No left ventricular thrombus is seen with  Definity contrast. Left  ventricular ejection fraction, by estimation, is 35 to 40%. The left  ventricle has moderately decreased function. The left ventricle has no  regional wall motion abnormalities. Left  ventricular diastolic parameters are consistent with Grade I diastolic  dysfunction (impaired relaxation). There is severe hypokinesis of the left  ventricular, mid-apical anteroseptal wall and anterior wall. There is mild  dyskinesis of the left  ventricular, entire apical segment.   2. Right ventricular systolic function is normal. The right ventricular  size is normal. Tricuspid regurgitation signal is inadequate for assessing  PA pressure.   3. The mitral valve is normal in structure. Trivial mitral valve  regurgitation. No evidence of mitral stenosis.   4. The aortic valve is normal in structure. Aortic valve regurgitation is  mild. No aortic stenosis is present.   5. The inferior vena cava is normal in size with greater than 50%  respiratory variability, suggesting right atrial pressure of 3 mmHg.   Patient Profile     74 y.o. female with diabetes, hyperlipidemia, and CKD 3 admitted with stroke and found to have acute systolic and diastolic heart failure with focal wall  motion abnormalities.  Assessment & Plan    #Acute systolic and diastolic heart failure: LVEF 35 to 40%.  There were anterior wall motion abnormalities concerning for infarct.  She has not had any chest pain.  She will go for left heart cath later today.  She has not tolerated beta-blockers or ARB's in the past.  Given the dyskinesis of the apical septum, this is concerning for a cardioembolic source, though no thrombus was noted on transthoracic echo.  #Acute stroke: Patient admitted with large right PCA infarct.  She will have an outpatient loop recorder.  Concern for cardioembolic source.  We will clarify the need for TEE with neurology.  #Hyperlipidemia: Lipids are very poorly controlled.  LDL was 221.  LDL goal should be less than 70 given her stroke history.  There is a report of her being on atorvastatin.  Suspect this was not being taken given the degree of her lipids.  Atorvastatin was increased to 80 mg.  She will need repeat lipids and a CMP in 2-3 months.  #Hypertension: Permissive hypertension for now.  She has an extensive medication allergy list including spironolactone, metoprolol, amlodipine, telmisartan, losartan, and lisinopril.  #COVID-19: Per primary team.  She has no respiratory distress.       For questions or updates, please contact [facility] Please consult [email or web address] for contact info under        Signed, Teodora Baumgarten [place], MD  08/06/2020, 9:48 AM    

## 2020-08-06 NOTE — Progress Notes (Signed)
Occupational Therapy Treatment Patient Details Name: Yolanda Saunders MRN: [id number] DOB: 1945-10-22 Today's Date: 08/06/2020    History of present illness 74yo female admitted 08/02/20 with increased confusion, speech changes, and visual changes. Imaging shows large R PCA CVA. Incidentally Covid +. PMH CKD, CVA, fibromyalgia, HTN, IBS, panic attacks, CVA, DM, lumbar surgery 5/22   OT comments  Focus of session on teaching pt safety and compensatory strategies for L field cut. Husband also present and participating in session. Pt receptive to education.   Follow Up Recommendations  Home health OT;Supervision/Assistance - 24 hour    Equipment Recommendations  None recommended by OT    Recommendations for Other Services      Precautions / Restrictions Precautions Precautions: Fall;Back Precaution Comments: Back surgery in May 2022       Mobility Bed Mobility Overal bed mobility: Needs Assistance Bed Mobility: Rolling;Sidelying to Sit;Sit to Sidelying Rolling: Modified independent (Device/Increase time) Sidelying to sit: Supervision     Sit to sidelying: Supervision      Transfers Overall transfer level: Needs assistance Equipment used: Rolling walker (2 wheeled) Transfers: Sit to/from Stand Sit to Stand: Min guard              Balance Overall balance assessment: Needs assistance   Sitting balance-Leahy Scale: Good                                     ADL either performed or assessed with clinical judgement   ADL Overall ADL's : Needs assistance/impaired   Eating/Feeding Details (indicate cue type and reason): currently NPO, but educated on turning place vs head to locate food/drink items in L hemispace Grooming: Oral care;Standing;Minimal assistance Grooming Details (indicate cue type and reason): spread items out on sink for pt to practice turning her head to locate ADL items on L                 Toilet Transfer: Min guard;BSC;RW    Toileting- Clothing Manipulation and Hygiene: Min guard;Sit to/from stand       Functional mobility during ADLs: Min guard;Rolling walker General ADL Comments: Educated in safety considerations given L field cut, pt aware she is not to drive, use the stove.     Vision   Visual Fields: Left Software engineer Arousal/Alertness: Awake/alert Behavior During Therapy: WFL for tasks assessed/performed Overall Cognitive Status: Impaired/Different from baseline Area of Impairment: Safety/judgement;Awareness;Problem solving                         Safety/Judgement: Decreased awareness of deficits;Decreased awareness of safety Awareness: Emergent Problem Solving: Slow processing;Decreased initiation;Difficulty sequencing;Requires verbal cues General Comments: pt with difficulty self cuing to compensate for L field cut        Exercises     Shoulder Instructions       General Comments      Pertinent Vitals/ Pain       Pain Assessment: Faces Faces Pain Scale: Hurts a little bit Pain Location: back Pain Descriptors / Indicators: Discomfort Pain Intervention(s): Monitored during session;Repositioned  Home Living  Prior Functioning/Environment              Frequency  Min 2X/week        Progress Toward Goals  OT Goals(current goals can now be found in the care plan section)  Progress towards OT goals: Progressing toward goals  Acute Rehab OT Goals Patient Stated Goal: Return home and recover OT Goal Formulation: With patient Time For Goal Achievement: 08/18/20 Potential to Achieve Goals: Fair  Plan Discharge plan remains appropriate    Co-evaluation                 AM-PAC OT "6 Clicks" Daily Activity     Outcome Measure   Help from another person eating meals?: A Little Help from another person taking care of personal grooming?: A  Little Help from another person toileting, which includes using toliet, bedpan, or urinal?: A Little Help from another person bathing (including washing, rinsing, drying)?: A Lot Help from another person to put on and taking off regular upper body clothing?: A Little Help from another person to put on and taking off regular lower body clothing?: A Lot 6 Click Score: 16    End of Session Equipment Utilized During Treatment: Rolling walker  OT Visit Diagnosis: Unsteadiness on feet (R26.81);Pain;History of falling (Z91.81);Muscle weakness (generalized) (M62.81)   Activity Tolerance Patient tolerated treatment well   Patient Left in bed;with call bell/phone within reach;with bed alarm set;with family/visitor present   Nurse Communication          Time: 6433-2951 OT Time Calculation (min): 16 min  Charges: OT General Charges $OT Visit: 1 Visit OT Treatments $Self Care/Home Management : 8-22 mins  .myinfo   [name] 08/06/2020, 1:00 PM

## 2020-08-06 NOTE — Interval H&P Note (Signed)
Cath Lab Visit (complete for each Cath Lab visit)  Clinical Evaluation Leading to the Procedure:   ACS: No.  Non-ACS:    Anginal Classification: CCS II  Anti-ischemic medical therapy: Minimal Therapy (1 class of medications)  Non-Invasive Test Results: No non-invasive testing performed  Prior CABG: No previous CABG      History and Physical Interval Note:  08/06/2020 9:07 PM  Laural Benes  has presented today for surgery, with the diagnosis of HF.  The various methods of treatment have been discussed with the patient and family. After consideration of risks, benefits and other options for treatment, the patient has consented to  Procedure(s): LEFT HEART CATH AND CORONARY ANGIOGRAPHY (N/A) as a surgical intervention.  The patient's history has been reviewed, patient examined, no change in status, stable for surgery.  I have reviewed the patient's chart and labs.  Questions were answered to the patient's satisfaction.     Lyn Records III

## 2020-08-06 NOTE — Progress Notes (Signed)
Progress Note  Patient Name: Yolanda Saunders Date of Encounter: 08/06/2020  [facility] Cardiologist: Armanda Magic, MD   Subjective   Feeling well.  Complains of chronic hip pain.   Inpatient Medications    Scheduled Meds:  aspirin EC  81 mg Oral Daily   atorvastatin  80 mg Oral Daily   clopidogrel  75 mg Oral Daily   enoxaparin (LOVENOX) injection  40 mg Subcutaneous Q24H   insulin aspart  0-9 Units Subcutaneous TID PC & HS   pantoprazole  40 mg Oral Daily   Continuous Infusions:  sodium chloride 50 mL/hr at 08/06/20 0743   remdesivir 100 mg in NS 100 mL 100 mg (08/05/20 0822)   PRN Meds: acetaminophen **OR** acetaminophen (TYLENOL) oral liquid 160 mg/5 mL **OR** acetaminophen, senna-docusate   Vital Signs    Vitals:   08/05/20 2018 08/05/20 2303 08/06/20 0505 08/06/20 0748  BP: (!) 160/73 (!) 153/82 (!) 156/87 (!) 152/74  Pulse: 85 86 76 85  Resp: 17 14 17 18   Temp: 98 F (36.7 C) 98.3 F (36.8 C) 97.9 F (36.6 C) 98.2 F (36.8 C)  TempSrc: Oral Oral Oral Oral  SpO2: 97% 95% 97% 94%  Weight:      Height:       No intake or output data in the 24 hours ending 08/06/20 0948 Last 3 Weights 08/03/2020 06/01/2020 05/30/2020  Weight (lbs) 183 lb 3.2 oz 196 lb 196 lb  Weight (kg) 83.1 kg 88.905 kg 88.905 kg      Telemetry    Sinus rhythm.   - Personally Reviewed  ECG    N/a - Personally Reviewed  Physical Exam   GEN: No acute distress.   Neck: No JVD Cardiac: RRR, no murmurs, rubs, or gallops.  Respiratory: Clear to auscultation bilaterally. GI: Soft, nontender, non-distended  MS: No edema; No deformity. Neuro:  Nonfocal  Psych: Normal affect   Labs    High Sensitivity Troponin:  No results for input(s): TROPONINIHS in the last 720 hours.    Chemistry Recent Labs  Lab 08/02/20 1616 08/02/20 1654 08/04/20 0022  NA 136 138 138  K 3.8 3.8 3.3*  CL 102 104 105  CO2 23  --  27  GLUCOSE 141* 143* 115*  BUN 15 18 17   CREATININE 1.22* 1.20*  1.33*  CALCIUM 9.6  --  9.2  PROT 6.5  --   --   ALBUMIN 3.6  --   --   AST 15  --   --   ALT 16  --   --   ALKPHOS 57  --   --   BILITOT 0.9  --   --   GFRNONAA 46*  --  41*  ANIONGAP 11  --  6     Hematology Recent Labs  Lab 08/02/20 1616 08/02/20 1654 08/04/20 0022  WBC 7.1  --  6.5  RBC 4.66  --  4.30  HGB 14.4 14.6 13.3  HCT 43.9 43.0 40.0  MCV 94.2  --  93.0  MCH 30.9  --  30.9  MCHC 32.8  --  33.3  RDW 14.0  --  14.2  PLT 213  --  193    BNPNo results for input(s): BNP, PROBNP in the last 168 hours.   DDimer No results for input(s): DDIMER in the last 168 hours.   Radiology    No results found.  Cardiac Studies   Echo 08/03/20: 1. No left ventricular thrombus is seen with  Definity contrast. Left  ventricular ejection fraction, by estimation, is 35 to 40%. The left  ventricle has moderately decreased function. The left ventricle has no  regional wall motion abnormalities. Left  ventricular diastolic parameters are consistent with Grade I diastolic  dysfunction (impaired relaxation). There is severe hypokinesis of the left  ventricular, mid-apical anteroseptal wall and anterior wall. There is mild  dyskinesis of the left  ventricular, entire apical segment.   2. Right ventricular systolic function is normal. The right ventricular  size is normal. Tricuspid regurgitation signal is inadequate for assessing  PA pressure.   3. The mitral valve is normal in structure. Trivial mitral valve  regurgitation. No evidence of mitral stenosis.   4. The aortic valve is normal in structure. Aortic valve regurgitation is  mild. No aortic stenosis is present.   5. The inferior vena cava is normal in size with greater than 50%  respiratory variability, suggesting right atrial pressure of 3 mmHg.   Patient Profile     75 y.o. female with diabetes, hyperlipidemia, and CKD 3 admitted with stroke and found to have acute systolic and diastolic heart failure with focal wall  motion abnormalities.  Assessment & Plan    #Acute systolic and diastolic heart failure: LVEF 35 to 40%.  There were anterior wall motion abnormalities concerning for infarct.  She has not had any chest pain.  She will go for left heart cath later today.  She has not tolerated beta-blockers or ARB's in the past.  Given the dyskinesis of the apical septum, this is concerning for a cardioembolic source, though no thrombus was noted on transthoracic echo.  #Acute stroke: Patient admitted with large right PCA infarct.  She will have an outpatient loop recorder.  Concern for cardioembolic source.  We will clarify the need for TEE with neurology.  #Hyperlipidemia: Lipids are very poorly controlled.  LDL was 221.  LDL goal should be less than 70 given her stroke history.  There is a report of her being on atorvastatin.  Suspect this was not being taken given the degree of her lipids.  Atorvastatin was increased to 80 mg.  She will need repeat lipids and a CMP in 2-3 months.  #Hypertension: Permissive hypertension for now.  She has an extensive medication allergy list including spironolactone, metoprolol, amlodipine, telmisartan, losartan, and lisinopril.  #COVID-19: Per primary team.  She has no respiratory distress.       For questions or updates, please contact [facility] Please consult [email or web address] for contact info under        Signed, Chilton Si, MD  08/06/2020, 9:48 AM

## 2020-08-06 NOTE — Progress Notes (Signed)
PT Cancellation Note  Patient Details Name: Yolanda Saunders MRN: [id number] DOB: 1945-03-31   Cancelled Treatment:    Reason Eval/Treat Not Completed: Patient at procedure or test/unavailable. NPO awaiting transport for heart cath.   Ilda Foil 08/06/2020, 12:43 PM

## 2020-08-07 ENCOUNTER — Encounter (HOSPITAL_COMMUNITY): Payer: Self-pay | Admitting: Interventional Cardiology

## 2020-08-07 ENCOUNTER — Encounter (HOSPITAL_COMMUNITY)
Admission: EM | Disposition: A | Discharge status: Home Health | Payer: Self-pay | Source: Home / Self Care | Attending: Internal Medicine

## 2020-08-07 DIAGNOSIS — I5042 Chronic combined systolic (congestive) and diastolic (congestive) heart failure: Secondary | ICD-10-CM

## 2020-08-07 DIAGNOSIS — I5022 Chronic systolic (congestive) heart failure: Secondary | ICD-10-CM

## 2020-08-07 DIAGNOSIS — I2583 Coronary atherosclerosis due to lipid rich plaque: Secondary | ICD-10-CM

## 2020-08-07 DIAGNOSIS — I2510 Atherosclerotic heart disease of native coronary artery without angina pectoris: Secondary | ICD-10-CM

## 2020-08-07 HISTORY — PX: RIGHT/LEFT HEART CATH AND CORONARY ANGIOGRAPHY: CATH118266

## 2020-08-07 HISTORY — DX: Chronic combined systolic (congestive) and diastolic (congestive) heart failure: I50.42

## 2020-08-07 LAB — POCT I-STAT EG7
Acid-base deficit: 3 mmol/L — ABNORMAL HIGH (ref 0.0–2.0)
Acid-base deficit: 3 mmol/L — ABNORMAL HIGH (ref 0.0–2.0)
Bicarbonate: 24.1 mmol/L (ref 20.0–28.0)
Bicarbonate: 24.3 mmol/L (ref 20.0–28.0)
Calcium, Ion: 1.35 mmol/L (ref 1.15–1.40)
Calcium, Ion: 1.35 mmol/L (ref 1.15–1.40)
HCT: 41 % (ref 36.0–46.0)
HCT: 41 % (ref 36.0–46.0)
Hemoglobin: 13.9 g/dL (ref 12.0–15.0)
Hemoglobin: 13.9 g/dL (ref 12.0–15.0)
O2 Saturation: 80 %
O2 Saturation: 82 %
Potassium: 4 mmol/L (ref 3.5–5.1)
Potassium: 4 mmol/L (ref 3.5–5.1)
Sodium: 141 mmol/L (ref 135–145)
Sodium: 142 mmol/L (ref 135–145)
TCO2: 26 mmol/L (ref 22–32)
TCO2: 26 mmol/L (ref 22–32)
pCO2, Ven: 49.7 mmHg (ref 44.0–60.0)
pCO2, Ven: 50.5 mmHg (ref 44.0–60.0)
pH, Ven: 7.291 (ref 7.250–7.430)
pH, Ven: 7.293 (ref 7.250–7.430)
pO2, Ven: 50 mmHg — ABNORMAL HIGH (ref 32.0–45.0)
pO2, Ven: 51 mmHg — ABNORMAL HIGH (ref 32.0–45.0)

## 2020-08-07 LAB — POCT I-STAT 7, (LYTES, BLD GAS, ICA,H+H)
Acid-base deficit: 3 mmol/L — ABNORMAL HIGH (ref 0.0–2.0)
Bicarbonate: 23.3 mmol/L (ref 20.0–28.0)
Calcium, Ion: 1.33 mmol/L (ref 1.15–1.40)
HCT: 38 % (ref 36.0–46.0)
Hemoglobin: 12.9 g/dL (ref 12.0–15.0)
O2 Saturation: 100 %
Potassium: 4 mmol/L (ref 3.5–5.1)
Sodium: 139 mmol/L (ref 135–145)
TCO2: 25 mmol/L (ref 22–32)
pCO2 arterial: 43.4 mmHg (ref 32.0–48.0)
pH, Arterial: 7.337 — ABNORMAL LOW (ref 7.350–7.450)
pO2, Arterial: 211 mmHg — ABNORMAL HIGH (ref 83.0–108.0)

## 2020-08-07 LAB — GLUCOSE, CAPILLARY
Glucose-Capillary: 174 mg/dL — ABNORMAL HIGH (ref 70–99)
Glucose-Capillary: 102 mg/dL — ABNORMAL HIGH (ref 70–99)
Glucose-Capillary: 162 mg/dL — ABNORMAL HIGH (ref 70–99)
Glucose-Capillary: 109 mg/dL — ABNORMAL HIGH (ref 70–99)
Glucose-Capillary: 141 mg/dL — ABNORMAL HIGH (ref 70–99)

## 2020-08-07 LAB — CBC
HCT: 42.8 % (ref 36.0–46.0)
Hemoglobin: 13.7 g/dL (ref 12.0–15.0)
MCH: 30.1 pg (ref 26.0–34.0)
MCHC: 32 g/dL (ref 30.0–36.0)
MCV: 94.1 fL (ref 80.0–100.0)
Platelets: 196 10*3/uL (ref 150–400)
RBC: 4.55 MIL/uL (ref 3.87–5.11)
RDW: 13.9 % (ref 11.5–15.5)
WBC: 6.9 10*3/uL (ref 4.0–10.5)
nRBC: 0 % (ref 0.0–0.2)

## 2020-08-07 LAB — CREATININE, SERUM
Creatinine, Ser: 1.24 mg/dL — ABNORMAL HIGH (ref 0.44–1.00)
GFR, Estimated: 45 mL/min — ABNORMAL LOW (ref >60)

## 2020-08-07 SURGERY — RIGHT/LEFT HEART CATH AND CORONARY ANGIOGRAPHY
Anesthesia: LOCAL

## 2020-08-07 MED ORDER — LORAZEPAM 2 MG/ML IJ SOLN: 1.0000 mg | Freq: Once | INTRAMUSCULAR | Status: DC

## 2020-08-07 MED ORDER — HYDROMORPHONE HCL 1 MG/ML IJ SOLN
0.5000 mg | Freq: Once | INTRAMUSCULAR | Status: AC
  Administered 2020-08-07: 0.5 mg via INTRAVENOUS
  Filled 2020-08-07: qty 0.5

## 2020-08-07 MED ORDER — SODIUM CHLORIDE 0.9% FLUSH
3.0000 mL | Freq: Two times a day (BID) | INTRAVENOUS | Status: DC
  Administered 2020-08-08: 3 mL via INTRAVENOUS

## 2020-08-07 MED ORDER — ONDANSETRON HCL 4 MG/2ML IJ SOLN: 4.0000 mg | Freq: Four times a day (QID) | INTRAMUSCULAR | Status: DC | PRN

## 2020-08-07 MED ORDER — ACETAMINOPHEN 325 MG PO TABS: 650.0000 mg | ORAL_TABLET | ORAL | Status: DC | PRN

## 2020-08-07 MED ORDER — HEPARIN (PORCINE) IN NACL 1000-0.9 UT/500ML-% IV SOLN
INTRAVENOUS | Status: DC | PRN
  Administered 2020-08-07 (×2): 500 mL

## 2020-08-07 MED ORDER — SODIUM CHLORIDE 0.9 % IV SOLN: 250.0000 mL | INTRAVENOUS | Status: DC | PRN

## 2020-08-07 MED ORDER — VERAPAMIL HCL 2.5 MG/ML IV SOLN
INTRAVENOUS | Status: DC | PRN
  Administered 2020-08-07: 10 mL via INTRA_ARTERIAL

## 2020-08-07 MED ORDER — IOHEXOL 350 MG/ML SOLN
INTRAVENOUS | Status: DC | PRN
  Administered 2020-08-07: 70 mL

## 2020-08-07 MED ORDER — FENTANYL CITRATE (PF) 100 MCG/2ML IJ SOLN
INTRAMUSCULAR | Status: DC | PRN
  Administered 2020-08-07: 25 ug via INTRAVENOUS

## 2020-08-07 MED ORDER — CARVEDILOL 3.125 MG PO TABS
3.1250 mg | ORAL_TABLET | Freq: Two times a day (BID) | ORAL | Status: DC
  Administered 2020-08-08: 3.125 mg via ORAL
  Filled 2020-08-07 (×2): qty 1

## 2020-08-07 MED ORDER — VERAPAMIL HCL 2.5 MG/ML IV SOLN
INTRAVENOUS | Status: AC
  Filled 2020-08-07: qty 2

## 2020-08-07 MED ORDER — ENOXAPARIN SODIUM 40 MG/0.4ML IJ SOSY
40.0000 mg | PREFILLED_SYRINGE | INTRAMUSCULAR | Status: DC
  Administered 2020-08-08: 40 mg via SUBCUTANEOUS
  Filled 2020-08-07: qty 0.4

## 2020-08-07 MED ORDER — SODIUM CHLORIDE 0.9 % IV SOLN: INTRAVENOUS | Status: AC

## 2020-08-07 MED ORDER — MIDAZOLAM HCL 2 MG/2ML IJ SOLN
INTRAMUSCULAR | Status: AC
  Filled 2020-08-07: qty 2

## 2020-08-07 MED ORDER — SODIUM CHLORIDE 0.9 % IV SOLN
12.5000 mg | Freq: Once | INTRAVENOUS | Status: DC
  Filled 2020-08-07: qty 0.5

## 2020-08-07 MED ORDER — MIDAZOLAM HCL 2 MG/2ML IJ SOLN
INTRAMUSCULAR | Status: DC | PRN
  Administered 2020-08-07: 0.5 mg via INTRAVENOUS

## 2020-08-07 MED ORDER — LIDOCAINE HCL (PF) 1 % IJ SOLN
INTRAMUSCULAR | Status: DC | PRN
  Administered 2020-08-07 (×2): 2 mL via SUBCUTANEOUS

## 2020-08-07 MED ORDER — HEPARIN (PORCINE) IN NACL 1000-0.9 UT/500ML-% IV SOLN
INTRAVENOUS | Status: AC
  Filled 2020-08-07: qty 1000

## 2020-08-07 MED ORDER — SODIUM CHLORIDE 0.9% FLUSH: 3.0000 mL | INTRAVENOUS | Status: DC | PRN

## 2020-08-07 MED ORDER — HEPARIN SODIUM (PORCINE) 1000 UNIT/ML IJ SOLN
INTRAMUSCULAR | Status: DC | PRN
  Administered 2020-08-07: 4500 [IU] via INTRAVENOUS

## 2020-08-07 MED ORDER — SODIUM CHLORIDE 0.9 % IV SOLN
INTRAVENOUS | Status: AC | PRN
  Administered 2020-08-07: 75 mL/h via INTRAVENOUS

## 2020-08-07 MED ORDER — LIDOCAINE HCL (PF) 1 % IJ SOLN
INTRAMUSCULAR | Status: AC
  Filled 2020-08-07: qty 30

## 2020-08-07 MED ORDER — FENTANYL CITRATE (PF) 100 MCG/2ML IJ SOLN
INTRAMUSCULAR | Status: AC
  Filled 2020-08-07: qty 2

## 2020-08-07 MED ORDER — HEPARIN SODIUM (PORCINE) 1000 UNIT/ML IJ SOLN
INTRAMUSCULAR | Status: AC
  Filled 2020-08-07: qty 1

## 2020-08-07 MED ORDER — HYDRALAZINE HCL 20 MG/ML IJ SOLN: 10.0000 mg | INTRAMUSCULAR | Status: AC | PRN

## 2020-08-07 SURGICAL SUPPLY — 11 items
CATH 5FR JL3.5 JR4 ANG PIG MP (CATHETERS) ×1 IMPLANT
CATH BALLN WEDGE 5F 110CM (CATHETERS) ×1 IMPLANT
DEVICE RAD COMP TR BAND LRG (VASCULAR PRODUCTS) ×1 IMPLANT
GLIDESHEATH SLEND A-KIT 6F 22G (SHEATH) ×1 IMPLANT
GUIDEWIRE INQWIRE 1.5J.035X260 (WIRE) IMPLANT
INQWIRE 1.5J .035X260CM (WIRE) ×2
KIT HEART LEFT (KITS) ×2 IMPLANT
PACK CARDIAC CATHETERIZATION (CUSTOM PROCEDURE TRAY) ×2 IMPLANT
SHEATH GLIDE SLENDER 4/5FR (SHEATH) ×1 IMPLANT
TRANSDUCER W/STOPCOCK (MISCELLANEOUS) ×2 IMPLANT
TUBING CIL FLEX 10 FLL-RA (TUBING) ×2 IMPLANT

## 2020-08-07 NOTE — CV Procedure (Signed)
Chronic total occlusion of the mid LAD after first septal perforator. Left to right and right to left collaterals to LAD. Moderate diffuse atherosclerosis noted otherwise without focal high-grade obstruction and RCA, or circumflex. LVEDP 3 mmHg. Right heart pressures were normal.

## 2020-08-07 NOTE — Progress Notes (Signed)
PROGRESS NOTE    Yolanda Saunders  ZOX:[id number] DOB: 1945-07-07 DOA: 08/02/2020 PCP: Johny BlamerHarris, William, MD    No chief complaint on file.   Brief Narrative:   Yolanda BenesRhonda B Saunders is a 75 y.o. female with medical history significant for CVA, hypertension, IBS, insulin-dependent type 2 diabetes, CKD stage IIIa, asthma and hyperlipidemia who presents with concerns of increasing confusion.   About 2 days ago has been began to note that she was having confusion.  She was confused about how to use a remote and her phone.  Also her speech was not making much sense.  She also noticed some changes to her vision although denies double vision.  Denies any focal extremity weakness.  She is mostly wheelchair-bound due to right hip pain and recently underwent lumbar microdiscectomy on 5/13.  Her right hip pain recently returned and she has repeat surgery scheduled for next week. Patient has recently been undergoing a lot of stress.  Her son recently passed and his funeral was last Friday which she missed as she was in the hospital.   Assessment & Plan:   Principal Problem:   Stroke [facility](HCC) Active Problems:   Hyperlipidemia   Type 2 diabetes mellitus with stage 3 chronic kidney disease, with long-term current use of insulin (HCC)   CKD (chronic kidney disease), stage III (HCC)   COVID-19 virus infection   Stroke (cerebrum) (HCC)   Chronic systolic heart failure (HCC)   Acute CVA -MRI brain significant for moderate size acute right PCA distribution infarct, underlying moderate chronic microvascular ischemic disease, with few scattered remote lacunar infarcts in the basal ganglia and right thalamus -CTA head and neck significant for acute right P2 occlusion, with severe 75% stenosis in the right PCA, with severe distal left P2 stenosis. -Currently on aspirin and Plavix, recommendation for dual antiplatelet therapy for 4277-month, then Plavix alone -Lower extremities venous Dopplers negative for DVT -2D  echo with EF 35 to 40% -Recommendation for loop recorder to rule out A. fib, neurology discussed with Dr. Leward QuanGamitz, given her COVID-19 status, likely plan for loop recorder as an outpatient -LDL significantly elevated at 221 -A1c is elevated at 7.2 -PT/OT consulted recommendation for home with home health.  COVID virus infection -Patient is unvaccinated -Treated with remdesivir -No acute finding on chest x-ray  Cardiomyopathy with EF 35% -New diagnosis, concerning for LAD infarct -She is already on aspirin, Plavix and statin,  -Patient went for cardiac cath today, which was significant for chronic total left occlusion of the mid LAD after first septal perforator, and left to right, right to left collaterals to LAD, moderate diffuse atherosclerosis noted otherwise without focal high-grade obstruction and RCA or circumflex, right heart pressure was normal -With multiple allergies limiting treatment options, allergy list including spironolactone, metoprolol, amlodipine, telmisartan, losartan, and lisinopril.  We will start on low-dose Coreg today to see if she tolerates it.     Insulin-dependent type 2 diabetes Hemoglobin A1c of 9.1 on 03/2020 it is 7.2 this admission -Continue with insulin sliding scale   Hypertension - Allow for permissive hypertension during stroke work-up, goal to normalize in 2 to 3 days  Hyperlipidemia LDL is 221, likely his Lipitor from 40-80-   CKD stage IIIa - Creatinine stable   Hyperlipidemia - Continue statin  DVT prophylaxis: Lovenox Code Status: Full Family Communication: Discussed with husband at bedside Disposition:   Status is: inpatient  The patient will require care spanning > 2 midnights and should be moved to inpatient because: IV  treatments appropriate due to intensity of illness or inability to take PO  Dispo: The patient is from: Home              Anticipated d/c is to: Home              Patient currently is not medically stable to d/c.      Difficult to place patient No       Consultants:  Neurology Cardiology  Subjective:  Denies any chest pain, shortness of breath, no new deficits  Objective: Vitals:   08/07/20 0732 08/07/20 1007 08/07/20 1010 08/07/20 1120  BP: 137/76   131/71  Pulse: 76   79  Resp: 15   18  Temp: 97.9 F (36.6 C)   98.3 F (36.8 C)  TempSrc: Oral   Oral  SpO2: 99% 96% 96% 99%  Weight:      Height:        Intake/Output Summary (Last 24 hours) at 08/07/2020 1745 Last data filed at 08/07/2020 1512 Gross per 24 hour  Intake 271.85 ml  Output --  Net 271.85 ml    Filed Weights   08/03/20 0101  Weight: 83.1 kg    Examination:   Awake Alert, Oriented X 3, No new F.N deficits, Normal affect Symmetrical Chest wall movement, Good air movement bilaterally, CTAB RRR,No Gallops,Rubs or new Murmurs, No Parasternal Heave +ve B.Sounds, Abd Soft, No tenderness, No rebound - guarding or rigidity. No Cyanosis, Clubbing or edema, No new Rash or bruise      Data Reviewed: I have personally reviewed following labs and imaging studies  CBC: Recent Labs  Lab 08/02/20 1616 08/02/20 1654 08/04/20 0022 08/07/20 1030 08/07/20 1047 08/07/20 1136  WBC 7.1  --  6.5  --   --  6.9  NEUTROABS 5.2  --   --   --   --   --   HGB 14.4 14.6 13.3 13.9  13.9 12.9 13.7  HCT 43.9 43.0 40.0 41.0  41.0 38.0 42.8  MCV 94.2  --  93.0  --   --  94.1  PLT 213  --  193  --   --  196    Basic Metabolic Panel: Recent Labs  Lab 08/02/20 1616 08/02/20 1654 08/04/20 0022 08/06/20 1103 08/07/20 1030 08/07/20 1047 08/07/20 1136  NA 136 138 138 138 142  141 139  --   K 3.8 3.8 3.3* 4.3 4.0  4.0 4.0  --   CL 102 104 105 104  --   --   --   CO2 23  --  27 24  --   --   --   GLUCOSE 141* 143* 115* 150*  --   --   --   BUN 15 18 17 16   --   --   --   CREATININE 1.22* 1.20* 1.33* 1.25*  --   --  1.24*  CALCIUM 9.6  --  9.2 9.6  --   --   --     GFR: Estimated Creatinine Clearance: 39.4 mL/min (A)  (by C-G formula based on SCr of 1.24 mg/dL (H)).  Liver Function Tests: Recent Labs  Lab 08/02/20 1616  AST 15  ALT 16  ALKPHOS 57  BILITOT 0.9  PROT 6.5  ALBUMIN 3.6    CBG: Recent Labs  Lab 08/06/20 2005 08/06/20 2243 08/07/20 0735 08/07/20 1135 08/07/20 1634  GLUCAP 174* 149* 102* 162* 109*     Recent Results (from the past 240 hour(s))  SARS CORONAVIRUS 2 (TAT 6-24 HRS) Nasopharyngeal Nasopharyngeal Swab     Status: Abnormal   Collection Time: 08/02/20  9:37 PM   Specimen: Nasopharyngeal Swab  Result Value Ref Range Status   SARS Coronavirus 2 POSITIVE (A) NEGATIVE Final    Comment: (NOTE) SARS-CoV-2 target nucleic acids are DETECTED.  The SARS-CoV-2 RNA is generally detectable in upper and lower respiratory specimens during the acute phase of infection. Positive results are indicative of the presence of SARS-CoV-2 RNA. Clinical correlation with patient history and other diagnostic information is  necessary to determine patient infection status. Positive results do not rule out bacterial infection or co-infection with other viruses.  The expected result is Negative.  Fact Sheet for Patients: HairSlick.no  Fact Sheet for Healthcare Providers: quierodirigir.com  This test is not yet approved or cleared by the [place] FDA and  has been authorized for detection and/or diagnosis of SARS-CoV-2 by FDA under an Emergency Use Authorization (EUA). This EUA will remain  in effect (meaning this test can be used) for the duration of the COVID-19 declaration under Section 564(b)(1) of the Act, 21 U. S.C. section 360bbb-3(b)(1), unless the authorization is terminated or revoked sooner.   Performed at [facility], [address] [place], [place] [address]   MRSA Next Gen by PCR, Nasal     Status: None   Collection Time: 08/03/20  1:28 AM   Specimen: Nasal Mucosa; Nasal Swab  Result Value Ref Range  Status   MRSA by PCR Next Gen NOT DETECTED NOT DETECTED Final    Comment: (NOTE) The GeneXpert MRSA Assay (FDA approved for NASAL specimens only), is one component of a comprehensive MRSA colonization surveillance program. It is not intended to diagnose MRSA infection nor to guide or monitor treatment for MRSA infections. Test performance is not FDA approved in patients less than 28 years old. Performed at [facility], [address], [place], [place] [address]          Radiology Studies: CARDIAC CATHETERIZATION  Result Date: 08/07/2020 Formatting of this result is different from the original.   Mid LAD lesion is 100% stenosed.   2nd Diag lesion is 100% stenosed. Total occlusion of the proximal to mid LAD with left to left and right to left collaterals.  LAD does not wraparound apex.  The first diagonal is large and also fills by collaterals. Irregularities with up to 50% in mid circumflex.  The very distal third obtuse marginal contains segmental 70% stenosis. Left main is widely patent RCA has diffuse 60% mid to distal narrowing after the first acute marginal ostium. Right heart pressures are normal LVEDP is normal RECOMMENDATIONS: Consider MRI to rule out apical thrombus versus empirical long-term anticoagulation therapy for presumed LV thrombus with embolic CVA.        Scheduled Meds:  aspirin EC  81 mg Oral Daily   atorvastatin  80 mg Oral Daily   [START ON 08/08/2020] carvedilol  3.125 mg Oral BID WC   clopidogrel  75 mg Oral Daily   [START ON 08/08/2020] enoxaparin (LOVENOX) injection  40 mg Subcutaneous Q24H   insulin aspart  0-9 Units Subcutaneous TID PC & HS   LORazepam  1 mg Intravenous Once   pantoprazole  40 mg Oral Daily   sodium chloride flush  3 mL Intravenous Q12H   Continuous Infusions:  sodium chloride     promethazine (PHENERGAN) injection (IM or IVPB) Stopped (08/07/20 4782)   remdesivir 100 mg in NS  100 mL 100 mg (08/06/20 1027)     LOS: 4 days        Huey Bienenstock, MD [facility]   To contact the attending provider between 7A-7P or the covering provider during after hours 7P-7A, please log into the web site [email or web address] and access using universal [place] password for that web site. If you do not have the password, please call the hospital operator.  08/07/2020, 5:45 PM

## 2020-08-07 NOTE — Progress Notes (Signed)
PT Cancellation Note  Patient Details Name: Yolanda Saunders MRN: [id number] DOB: 09-Jul-1945   Cancelled Treatment:    Reason Eval/Treat Not Completed: Other (comment) per RN likely leaving for cardiac cath very soon. Holding for now, will continue to follow.   Madelaine Etienne, DPT, PN1   Supplemental Physical Therapist [facility]    Pager [phone] Acute Rehab Office [phone]

## 2020-08-07 NOTE — Care Management (Signed)
Attempt to reach patient and spouse by phone to discuss DC plan and HH recommendations unsuccessful.

## 2020-08-07 NOTE — Progress Notes (Signed)
Occupational Therapy Treatment Patient Details Name: Yolanda Saunders MRN: [id number] DOB: 1945/07/06 Today's Date: 08/07/2020    History of present illness 74yo female admitted 08/02/20 with increased confusion, speech changes, and visual changes. Imaging shows large R PCA CVA. Incidentally Covid +. PMH CKD, CVA, fibromyalgia, HTN, IBS, panic attacks, CVA, DM, lumbar surgery 5/22   OT comments  Pt and husband continues to be receptive to education regarding pt's visual field cut, safety issues and compensatory strategies. Pt requires min assist at sink for grooming and min guard assist to navigate around her room with RW. Maximum verbal cues and increased time to employ techniques with attempt to read large print.   Follow Up Recommendations  Home health OT;Supervision/Assistance - 24 hour    Equipment Recommendations  None recommended by OT    Recommendations for Other Services      Precautions / Restrictions Precautions Precautions: Fall;Back Precaution Comments: back for comfort       Mobility Bed Mobility Overal bed mobility: Modified Independent             General bed mobility comments: using log roll    Transfers Overall transfer level: Needs assistance Equipment used: Rolling walker (2 wheeled) Transfers: Sit to/from Stand Sit to Stand: Min guard              Balance Overall balance assessment: Needs assistance   Sitting balance-Leahy Scale: Good     Standing balance support: During functional activity Standing balance-Leahy Scale: Fair                             ADL either performed or assessed with clinical judgement   ADL Overall ADL's : Needs assistance/impaired     Grooming: Minimal assistance;Standing;Oral care Grooming Details (indicate cue type and reason): assist to place toothpaste on brush                             Functional mobility during ADLs: Min guard;Rolling walker       Vision   Visual  Fields: Left visual field deficit Additional Comments: Educated on using head turn and placing reading items in R hemispace, required max verbal cues   Perception     Praxis      Cognition Arousal/Alertness: Awake/alert Behavior During Therapy: WFL for tasks assessed/performed Overall Cognitive Status: Impaired/Different from baseline Area of Impairment: Problem solving;Safety/judgement;Awareness                         Safety/Judgement: Decreased awareness of deficits Awareness: Emergent Problem Solving: Slow processing;Decreased initiation General Comments: difficulty employing compensatory strategies for field cut        Exercises     Shoulder Instructions       General Comments      Pertinent Vitals/ Pain       Pain Assessment: No/denies pain Pain Score: 8  Pain Location: R hip Pain Descriptors / Indicators: Aching Pain Intervention(s): Monitored during session;Repositioned;Premedicated before session  Home Living                                          Prior Functioning/Environment              Frequency  Min 2X/week        Progress  Toward Goals  OT Goals(current goals can now be found in the care plan section)  Progress towards OT goals: Progressing toward goals  Acute Rehab OT Goals Patient Stated Goal: Return home and recover OT Goal Formulation: With patient Time For Goal Achievement: 08/18/20 Potential to Achieve Goals: Fair  Plan Discharge plan remains appropriate    Co-evaluation                 AM-PAC OT "6 Clicks" Daily Activity     Outcome Measure   Help from another person eating meals?: A Little Help from another person taking care of personal grooming?: A Little Help from another person toileting, which includes using toliet, bedpan, or urinal?: A Little Help from another person bathing (including washing, rinsing, drying)?: A Little Help from another person to put on and taking off regular  upper body clothing?: A Little Help from another person to put on and taking off regular lower body clothing?: A Little 6 Click Score: 18    End of Session Equipment Utilized During Treatment: Gait belt;Rolling walker  OT Visit Diagnosis: Unsteadiness on feet (R26.81);Pain;History of falling (Z91.81);Muscle weakness (generalized) (M62.81) Pain - Right/Left: Right Pain - part of body: Hip   Activity Tolerance Patient tolerated treatment well   Patient Left     Nurse Communication          Time: 8563-1497 OT Time Calculation (min): 25 min  Charges: OT General Charges $OT Visit: 1 Visit OT Treatments $Self Care/Home Management : 23-37 mins  Martie Round, OTR/L Acute Rehabilitation Services Pager: [phone] Office: [phone]   [name] 08/07/2020, 10:30 AM

## 2020-08-08 ENCOUNTER — Other Ambulatory Visit (HOSPITAL_COMMUNITY): Payer: Self-pay

## 2020-08-08 ENCOUNTER — Encounter (HOSPITAL_COMMUNITY): Admission: RE | Payer: Self-pay | Source: Home / Self Care

## 2020-08-08 ENCOUNTER — Other Ambulatory Visit: Payer: Self-pay | Admitting: Student

## 2020-08-08 ENCOUNTER — Ambulatory Visit (HOSPITAL_COMMUNITY): Admission: RE | Admit: 2020-08-08 | Payer: Medicare Other | Source: Home / Self Care | Admitting: Neurological Surgery

## 2020-08-08 DIAGNOSIS — I63431 Cerebral infarction due to embolism of right posterior cerebral artery: Secondary | ICD-10-CM

## 2020-08-08 DIAGNOSIS — I5022 Chronic systolic (congestive) heart failure: Secondary | ICD-10-CM

## 2020-08-08 LAB — CBC
HCT: 40.9 % (ref 36.0–46.0)
Hemoglobin: 13.5 g/dL (ref 12.0–15.0)
MCH: 30.8 pg (ref 26.0–34.0)
MCHC: 33 g/dL (ref 30.0–36.0)
MCV: 93.2 fL (ref 80.0–100.0)
Platelets: 191 10*3/uL (ref 150–400)
RBC: 4.39 MIL/uL (ref 3.87–5.11)
RDW: 14 % (ref 11.5–15.5)
WBC: 7.4 10*3/uL (ref 4.0–10.5)
nRBC: 0 % (ref 0.0–0.2)

## 2020-08-08 LAB — GLUCOSE, CAPILLARY
Glucose-Capillary: 101 mg/dL — ABNORMAL HIGH (ref 70–99)
Glucose-Capillary: 123 mg/dL — ABNORMAL HIGH (ref 70–99)

## 2020-08-08 LAB — BASIC METABOLIC PANEL
Anion gap: 9 (ref 5–15)
BUN: 18 mg/dL (ref 8–23)
CO2: 21 mmol/L — ABNORMAL LOW (ref 22–32)
Calcium: 9.6 mg/dL (ref 8.9–10.3)
Chloride: 108 mmol/L (ref 98–111)
Creatinine, Ser: 1.24 mg/dL — ABNORMAL HIGH (ref 0.44–1.00)
GFR, Estimated: 45 mL/min — ABNORMAL LOW (ref >60)
Glucose, Bld: 115 mg/dL — ABNORMAL HIGH (ref 70–99)
Potassium: 4.3 mmol/L (ref 3.5–5.1)
Sodium: 138 mmol/L (ref 135–145)

## 2020-08-08 SURGERY — LUMBAR LAMINECTOMY/ DECOMPRESSION WITH MET-RX
Anesthesia: General | Laterality: Right

## 2020-08-08 MED ORDER — FUROSEMIDE 40 MG PO TABS: 20.0000 mg | ORAL_TABLET | Freq: Every day | ORAL | Status: DC

## 2020-08-08 MED ORDER — IRBESARTAN 75 MG PO TABS
37.5000 mg | ORAL_TABLET | Freq: Every day | ORAL | Status: DC
  Administered 2020-08-08: 37.5 mg via ORAL
  Filled 2020-08-08: qty 1

## 2020-08-08 MED ORDER — CARVEDILOL 3.125 MG PO TABS: 3.1250 mg | ORAL_TABLET | Freq: Two times a day (BID) | ORAL | Status: DC

## 2020-08-08 MED ORDER — VITAMIN D3 125 MCG (5000 UT) PO TABS: 5000.0000 [IU] | ORAL_TABLET | ORAL | Status: DC

## 2020-08-08 MED ORDER — ACETAMINOPHEN 325 MG PO TABS: 650.0000 mg | ORAL_TABLET | Freq: Four times a day (QID) | ORAL | Status: DC | PRN

## 2020-08-08 MED ORDER — IRBESARTAN 75 MG PO TABS
37.5000 mg | ORAL_TABLET | Freq: Every day | ORAL | 0 refills | Status: DC
  Filled 2020-08-08: qty 30, 60d supply, fill #0

## 2020-08-08 MED ORDER — CARVEDILOL 3.125 MG PO TABS
3.1250 mg | ORAL_TABLET | Freq: Two times a day (BID) | ORAL | 0 refills | Status: DC
  Filled 2020-08-08: qty 60, 30d supply, fill #0

## 2020-08-08 MED ORDER — PANTOPRAZOLE SODIUM 40 MG PO TBEC
40.0000 mg | DELAYED_RELEASE_TABLET | Freq: Every day | ORAL | 0 refills | Status: AC
  Filled 2020-08-08: qty 30, 30d supply, fill #0

## 2020-08-08 MED ORDER — CLOPIDOGREL BISULFATE 75 MG PO TABS
75.0000 mg | ORAL_TABLET | Freq: Every day | ORAL | 0 refills | Status: DC
  Filled 2020-08-08: qty 30, 30d supply, fill #0

## 2020-08-08 MED ORDER — ATORVASTATIN CALCIUM 80 MG PO TABS
80.0000 mg | ORAL_TABLET | Freq: Every day | ORAL | 0 refills | Status: AC
  Filled 2020-08-08: qty 30, 30d supply, fill #0

## 2020-08-08 MED ORDER — CARVEDILOL 6.25 MG PO TABS: 6.2500 mg | ORAL_TABLET | Freq: Two times a day (BID) | ORAL | Status: DC

## 2020-08-08 MED ORDER — INSULIN GLARGINE 100 UNIT/ML ~~LOC~~ SOLN: 10.0000 [IU] | Freq: Every day | SUBCUTANEOUS | 3 refills | Status: DC

## 2020-08-08 NOTE — Discharge Instructions (Addendum)
Heart Failure Education: Weigh yourself EVERY morning after you go to the bathroom but before you eat or drink anything. Write this number down in a weight log/diary. If you gain 3 pounds overnight or 5 pounds in a week, call the office. Take your medicines as prescribed. If you have concerns about your medications, please call us before you stop taking them.  Eat low salt foods--Limit salt (sodium) to 2000 mg per day. This will help prevent your body from holding onto fluid. Read food labels as many processed foods have a lot of sodium, especially canned goods and prepackaged meats. If you would like some assistance choosing low sodium foods, we would be happy to set you up with a nutritionist. Limit all fluids for the day to less than 2 liters (64 ounces). Fluid includes all drinks, coffee, juice, ice chips, soup, jello, and all other liquids. Stay as active as you can everyday. Staying active will give you more energy and make your muscles stronger. Start with 5 minutes at a time and work your way up to 30 minutes a day. Break up your activities--do some in the morning and some in the afternoon. Start with 3 days per week and work your way up to 5 days as you can.  If you have chest pain, feel short of breath, dizzy, or lightheaded, STOP. If you don't feel better after a short rest, call 911. If you do feel better, call the office to let us know you have symptoms with exercise.  Radial Site Care: Refer to this sheet in the next few weeks. These instructions provide you with information on caring for yourself after your procedure. Your caregiver may also give you more specific instructions. Your treatment has been planned according to current medical practices, but problems sometimes occur. Call your caregiver if you have any problems or questions after your procedure. HOME CARE INSTRUCTIONS You may shower the day after the procedure. Remove the bandage (dressing) and gently wash the site with plain soap  and water. Gently pat the site dry.  Do not apply powder or lotion to the site.  Do not submerge the affected site in water for 3 to 5 days.  Inspect the site at least twice daily.  Do not flex or bend the affected arm for 24 hours.  No lifting over 5 pounds (2.3 kg) for 5 days after your procedure.  Do not drive home if you are discharged the same day of the procedure. Have someone else drive you.  What to expect: Any bruising will usually fade within 1 to 2 weeks.  Blood that collects in the tissue (hematoma) may be painful to the touch. It should usually decrease in size and tenderness within 1 to 2 weeks.  SEEK IMMEDIATE MEDICAL CARE IF: You have unusual pain at the radial site.  You have redness, warmth, swelling, or pain at the radial site.  You have drainage (other than a small amount of blood on the dressing).  You have chills.  You have a fever or persistent symptoms for more than 72 hours.  You have a fever and your symptoms suddenly get worse.  Your arm becomes pale, cool, tingly, or numb.  You have heavy bleeding from the site. Hold pressure on the site.

## 2020-08-08 NOTE — Progress Notes (Signed)
   Discussed case with Dr. Elberta Fortis and at length with Marjie Skiff, PA.   Pt not currently a candidate for loop recorder given low EF.  Would recommend 30 day monitor.   Will need GDMT x 3 months then repeat.  If EF remains 35% or less will need ICD consideration.   If cMRI suggestive of LV thrombus and started on OAC, will NOT need loop.   If NO thrombus, and EF normalizes or is out of ICD range on GDMT, could consider loop at that time.   Casimiro Needle [address]" [place], PA-C  08/08/2020 12:07 PM

## 2020-08-08 NOTE — Progress Notes (Addendum)
Progress Note  Patient Name: Yolanda Saunders Date of Encounter: 08/08/2020  [facility] Cardiologist: Armanda Magic, MD   Subjective   No acute overnight events. No chest pain, shortness of breath, or palpitations. She is very eager to go home.  Inpatient Medications    Scheduled Meds:  aspirin EC  81 mg Oral Daily   atorvastatin  80 mg Oral Daily   carvedilol  3.125 mg Oral BID WC   clopidogrel  75 mg Oral Daily   enoxaparin (LOVENOX) injection  40 mg Subcutaneous Q24H   insulin aspart  0-9 Units Subcutaneous TID PC & HS   irbesartan  37.5 mg Oral Daily   LORazepam  1 mg Intravenous Once   pantoprazole  40 mg Oral Daily   sodium chloride flush  3 mL Intravenous Q12H   Continuous Infusions:  sodium chloride     promethazine (PHENERGAN) injection (IM or IVPB) Stopped (08/07/20 3474)   remdesivir 100 mg in NS 100 mL 100 mg (08/06/20 1027)   PRN Meds: sodium chloride, acetaminophen **OR** acetaminophen (TYLENOL) oral liquid 160 mg/5 mL **OR** acetaminophen, ondansetron (ZOFRAN) IV, senna-docusate, sodium chloride flush   Vital Signs    Vitals:   08/07/20 2005 08/08/20 0447 08/08/20 0735 08/08/20 0746  BP:  (!) 137/92 (!) 177/93 (!) 166/82  Pulse: 86 75 82 79  Resp:  18 10 15   Temp:  98.7 F (37.1 C) 97.7 F (36.5 C) 97.7 F (36.5 C)  TempSrc:  Oral Oral Oral  SpO2:  95% 96% 97%  Weight:      Height:        Intake/Output Summary (Last 24 hours) at 08/08/2020 0827 Last data filed at 08/07/2020 2200 Gross per 24 hour  Intake 511.85 ml  Output --  Net 511.85 ml   Last 3 Weights 08/03/2020 06/01/2020 05/30/2020  Weight (lbs) 183 lb 3.2 oz 196 lb 196 lb  Weight (kg) 83.1 kg 88.905 kg 88.905 kg      Telemetry    Normal sinus rhythm with rates in the 70s to 80s.- Personally Reviewed  ECG    No new ECG tracing today. - Personally Reviewed  Physical Exam   GEN: No acute distress.   Neck: No JVD. Cardiac: RRR. No murmurs, rubs, or gallops. Right radial  cath site soft and stable with no signs of hematoma. Respiratory: Clear to auscultation bilaterally. GI: Soft, non-distended, and non-tender.  MS: No lower extremity edema. No deformity. Skin: Warm and dry. Neuro:  No focal deficits. Psych: Normal affect. Responds appropriately.  Labs    High Sensitivity Troponin:  No results for input(s): TROPONINIHS in the last 720 hours.    Chemistry Recent Labs  Lab 08/02/20 1616 08/02/20 1654 08/04/20 0022 08/06/20 1103 08/07/20 1030 08/07/20 1047 08/07/20 1136 08/08/20 0051  NA 136   < > 138 138 142  141 139  --  138  K 3.8   < > 3.3* 4.3 4.0  4.0 4.0  --  4.3  CL 102   < > 105 104  --   --   --  108  CO2 23  --  27 24  --   --   --  21*  GLUCOSE 141*   < > 115* 150*  --   --   --  115*  BUN 15   < > 17 16  --   --   --  18  CREATININE 1.22*   < > 1.33* 1.25*  --   --  1.24* 1.24*  CALCIUM 9.6  --  9.2 9.6  --   --   --  9.6  PROT 6.5  --   --   --   --   --   --   --   ALBUMIN 3.6  --   --   --   --   --   --   --   AST 15  --   --   --   --   --   --   --   ALT 16  --   --   --   --   --   --   --   ALKPHOS 57  --   --   --   --   --   --   --   BILITOT 0.9  --   --   --   --   --   --   --   GFRNONAA 46*  --  41* 45*  --   --  45* 45*  ANIONGAP 11  --  6 10  --   --   --  9   < > = values in this interval not displayed.     Hematology Recent Labs  Lab 08/04/20 0022 08/07/20 1030 08/07/20 1047 08/07/20 1136 08/08/20 0051  WBC 6.5  --   --  6.9 7.4  RBC 4.30  --   --  4.55 4.39  HGB 13.3   < > 12.9 13.7 13.5  HCT 40.0   < > 38.0 42.8 40.9  MCV 93.0  --   --  94.1 93.2  MCH 30.9  --   --  30.1 30.8  MCHC 33.3  --   --  32.0 33.0  RDW 14.2  --   --  13.9 14.0  PLT 193  --   --  196 191   < > = values in this interval not displayed.    BNPNo results for input(s): BNP, PROBNP in the last 168 hours.   DDimer No results for input(s): DDIMER in the last 168 hours.   Radiology    CARDIAC CATHETERIZATION  Result  Date: 08/07/2020 Formatting of this result is different from the original.   Mid LAD lesion is 100% stenosed.   2nd Diag lesion is 100% stenosed. Total occlusion of the proximal to mid LAD with left to left and right to left collaterals.  LAD does not wraparound apex.  The first diagonal is large and also fills by collaterals. Irregularities with up to 50% in mid circumflex.  The very distal third obtuse marginal contains segmental 70% stenosis. Left main is widely patent RCA has diffuse 60% mid to distal narrowing after the first acute marginal ostium. Right heart pressures are normal LVEDP is normal RECOMMENDATIONS: Consider MRI to rule out apical thrombus versus empirical long-term anticoagulation therapy for presumed LV thrombus with embolic CVA.    Cardiac Studies   Echocardiogram 08/03/2020: Impressions: 1. No left ventricular thrombus is seen with Definity contrast. Left  ventricular ejection fraction, by estimation, is 35 to 40%. The left  ventricle has moderately decreased function. The left ventricle has no  regional wall motion abnormalities. Left  ventricular diastolic parameters are consistent with Grade I diastolic  dysfunction (impaired relaxation). There is severe hypokinesis of the left  ventricular, mid-apical anteroseptal wall and anterior wall. There is mild  dyskinesis of the left  ventricular, entire apical segment.   2. Right ventricular systolic function  is normal. The right ventricular  size is normal. Tricuspid regurgitation signal is inadequate for assessing  PA pressure.   3. The mitral valve is normal in structure. Trivial mitral valve  regurgitation. No evidence of mitral stenosis.   4. The aortic valve is normal in structure. Aortic valve regurgitation is  mild. No aortic stenosis is present.   5. The inferior vena cava is normal in size with greater than 50%  respiratory variability, suggesting right atrial pressure of 3 mmHg.   Comparison(s): Prior images  unable to be directly viewed, comparison made  by report only. Changes from prior study are noted. The left ventricular  function is significantly worse. The left ventricular wall motion  abnormalities are new. _______________  Right/Left Cardiac Catheterization 08/07/2020:   Mid LAD lesion is 100% stenosed.   2nd Diag lesion is 100% stenosed.   Total occlusion of the proximal to mid LAD with left to left and right to left collaterals.  LAD does not wraparound apex.  The first diagonal is large and also fills by collaterals. Irregularities with up to 50% in mid circumflex.  The very distal third obtuse marginal contains segmental 70% stenosis. Left main is widely patent RCA has diffuse 60% mid to distal narrowing after the first acute marginal ostium. Right heart pressures are normal LVEDP is normal   Recommendations: Consider MRI to rule out apical thrombus versus empirical long-term anticoagulation therapy for presumed LV thrombus with embolic CVA.   Patient Profile     75 y.o. female with a history of hyperlipidemia, diabetes, and CKD stage III who was admitted on 08/02/2020 with an acute stroke and found to have new cardiomyopathy new acute combined CHF with wall motion abnormalities.  Assessment & Plan    Newly Diagnosed Acute Combined CHF - Echo showed LVEF of 35-40% with severe hypokinesis of the mid-apical anteroseptal wall and anterior wall as well as mild dyskinesis of the entire apical segment. RV normal. - R/LHC on 7/19 showed CTO of proximal to mid LAD with left to left and right to left collaterals as well as CTO of 2nd Diag and moderate non-obstructive disease elsewhere.  - Started on GDMT this morning with Coreg 3.125mg  twice daily and Irbesartan 37.5mg  daily. Of note, patient has multiple medications intolerances including to Lisinopril, Losartan, Olmesartan, Spironolactone, and Metoprolol.  - Consider discharging on PRN Lasix 40mg  for weight gain. - Discussed importance  of daily weights and sodium/fluid restrictions at home.  - Will need repeat Echo in 3 months after being started on GDMT.  CAD - LHC on 7/19 showed CTO of proximal to mid LAD with left to left and right to left collaterals as well as CTO of 2nd Diag and moderate non-obstructive disease elsewhere.  - No angina.  - Started on DAPT with Aspirin and Plavix for stroke. - Continue beta-blocker and high-intensity statin.  Acute Stroke - Admitted with large right PCA infarct. Concern for cardioembolic source. - With severe apical akinesis, there is concern for LV thrombus. Will get outpatient cardiac MRI to rule this out.  - Spoke with EP - not a loop candidate at this time given reduced EF. They recommend repeat Echo in 3 months after being on GDMT. If EF still down, possibly an ICD candidate. If EF has improved, could be referred for possible loop recorder. If cardiac MRI shows LV thrombus, won't need loop recorder. Will arrange 30 day event monitor.  Hypertension - BP elevated with systolic often in the 150s to 8/19  over the last 24 hours.  - Continue medications for CHF as above.  Hyperlipidemia - Very poorly controlled.  - Lipid panel this admission: Total Cholesterol 278, Triglycerides 137, HDL 30, LDL 221.  - LDL goal <70 given CAD. - Lipitor 40mg  listed under PTA medications but suspected that patient was not taking this. Lipitor increased to 80mg  daily. - Will need repeat lipid panel and LFTs in 6-8 weeks. If LDL still above goal, consider referral to lipid clinic for consideration of PCSK9 inhibitor.  Type 2 Diabetes Mellitus  - Hemoglobin A1c 7.2 this admission. - Management per primary team.  COVID-19 - Treated with Remdesivir. - No respiratory distress. - Management per primary team.  For questions or updates, please contact [facility] Please consult [email or web address] for contact info under        Signed, , PA-C  08/08/2020, 8:27 AM

## 2020-08-08 NOTE — Discharge Summary (Signed)
Physician Discharge Summary  Yolanda Saunders TDS:[id number] DOB: Jan 12, 1946 DOA: 08/02/2020  PCP: Shirline Frees, MD  Admit date: 08/02/2020 Discharge date: 08/08/2020  Admitted From: Home Disposition:  Home   Recommendations for Outpatient Follow-up:  Follow up with PCP in 1-2 weeks Please obtain BMP/CBC in one week Patient will need 30 days heart monitor, cardiology will arrange Patient to follow with cardiology as an outpatient regarding cardiac MRI Patient to follow with neurology as an outpatient regarding further work-up of stroke  Home Health:YES Equipment/Devices:None  Discharge Condition:Stable CODE STATUS:FULL Diet recommendation: Heart Healthy / Carb Modified   Brief/Interim Summary:     Acute CVA -MRI brain significant for moderate size acute right PCA distribution infarct, underlying moderate chronic microvascular ischemic disease, with few scattered remote lacunar infarcts in the basal ganglia and right thalamus -CTA head and neck significant for acute right P2 occlusion, with severe 75% stenosis in the right PCA, with severe distal left P2 stenosis. -Currently on aspirin and Plavix, recommendation for dual antiplatelet therapy for 84-month then Plavix alone -Lower extremities venous Dopplers negative for DVT -2D echo with EF 35 to 40% -Recommendation for loop recorder to rule out A. fib, neurology discussed with Dr. GSuann Larry given her COVID-19 status, likely plan for loop recorder as an outpatient, given low EF patient is not a candidate for loop recorder, and recommendation for 30 days monitor. -LDL significantly elevated at 221 -A1c is elevated at 7.2 -PT/OT consulted recommendation for home with home health. -Patient will have cardiac MRI as an outpatient to rule out LV thrombus.   COVID virus infection -Patient is unvaccinated -Treated with remdesivir -No acute finding on chest x-ray   Cardiomyopathy with EF 35% -New diagnosis, concerning for LAD  infarct -She is already on aspirin, Plavix and statin, -Patient went for cardiac cath , which was significant for chronic total left occlusion of the mid LAD after first septal perforator, and left to right, right to left collaterals to LAD, moderate diffuse atherosclerosis noted otherwise without focal high-grade obstruction and RCA or circumflex, right heart pressure was normal -Started on low-dose Coreg, she tolerated well, as well started on Avapro before discharge, to follow-up with cardiology as an outpatient regarding further recommendations .    Insulin-dependent type 2 diabetes Hemoglobin A1c of 9.1 on 03/2020 it is 7.2 this admission -Her outpatient CBGs has been controlled during hospital stay only on sliding scale, so her Lantus has been lowered on discharge from 30 units to 10 units daily.  Hypertension -Allow for permissive hypertension initially during hospital stay, she was started on low-dose Coreg, Avapro, hydrochlorothiazide has been stopped on discharge, can uptitrate her beta-blockers and ARB optimal control.    Hyperlipidemia LDL is 221, Lipitor has been increased from 40 to 80 mg oral daily  CKD stage IIIa - Creatinine stable   Discharge Diagnoses:  Principal Problem:   Stroke ([facility] Active Problems:   Hyperlipidemia   Type 2 diabetes mellitus with stage 3 chronic kidney disease, with long-term current use of insulin (HCC)   [place]   COVID-19 virus infection   Stroke (cerebrum) (HCC)   Chronic systolic heart failure ([facility]    Discharge Instructions  Discharge Instructions     Ambulatory referral to Neurology   Complete by: As directed    Follow up with stroke clinic NP (Jessica Vanschaick or CCecille Rubin if both not available, consider [facility].   Diet -  low sodium heart healthy   Complete by: As directed    Discharge instructions   Complete by: As directed     Follow with Primary MD Shirline Frees, MD in 10 days   Get CBC, CMP,  checked  by Primary MD next visit.    Activity: As tolerated with Full fall precautions use walker/cane & assistance as needed   Disposition Home    Diet: Heart Healthy /carb modified , with feeding assistance and aspiration precautions.  For Heart failure patients - Check your Weight same time everyday, if you gain over 2 pounds, or you develop in leg swelling, experience more shortness of breath or chest pain, call your Primary MD immediately. Follow Cardiac Low Salt Diet and 1.5 lit/day fluid restriction.   On your next visit with your primary care physician please Get Medicines reviewed and adjusted.   Please request your Prim.MD to go over all Hospital Tests and Procedure/Radiological results at the follow up, please get all Hospital records sent to your Prim MD by signing hospital release before you go home.   If you experience worsening of your admission symptoms, develop shortness of breath, life threatening emergency, suicidal or homicidal thoughts you must seek medical attention immediately by calling 911 or calling your MD immediately  if symptoms less severe.  You Must read complete instructions/literature along with all the possible adverse reactions/side effects for all the Medicines you take and that have been prescribed to you. Take any new Medicines after you have completely understood and accpet all the possible adverse reactions/side effects.   Do not drive, operating heavy machinery, perform activities at heights, swimming or participation in water activities or provide baby sitting services if your were admitted for syncope or siezures until you have seen by Primary MD or a Neurologist and advised to do so again.  Do not drive when taking Pain medications.    Do not take more than prescribed Pain, Sleep and Anxiety Medications  Special Instructions: If you have smoked or chewed Tobacco  in the  last 2 yrs please stop smoking, stop any regular Alcohol  and or any Recreational drug use.  Wear Seat belts while driving.   Please note  You were cared for by a hospitalist during your hospital stay. If you have any questions about your discharge medications or the care you received while you were in the hospital after you are discharged, you can call the unit and asked to speak with the hospitalist on call if the hospitalist that took care of you is not available. Once you are discharged, your primary care physician will handle any further medical issues. Please note that NO REFILLS for any discharge medications will be authorized once you are discharged, as it is imperative that you return to your primary care physician (or establish a relationship with a primary care physician if you do not have one) for your aftercare needs so that they can reassess your need for medications and monitor your lab values.   Increase activity slowly   Complete by: As directed       Allergies as of 08/08/2020       Reactions   Codeine Other (See Comments)   "makes me crazy; see things; delusional"   Erythromycin Other (See Comments)   "peeled skin; like a sunburn & I turn the color of the pill; a kind of purple-look"   Lisinopril Cough   "cause I have asthma"   Penicillins Rash, Other (See Comments)   "  puffy blisters   Shellfish Allergy    Throat swells   Sulfonamide Derivatives Hives   "watery hives"   Metoprolol Diarrhea, Nausea And Vomiting   Rapid weight gain.   Actos [pioglitazone]    Upset GI   Amlodipine    Syncope   Benicar [olmesartan] Other (See Comments)   Dizziness and HA   Byetta 10 Mcg Pen [exenatide] Nausea And Vomiting   5 MCG pen   Gabapentin Other (See Comments)   Anxiety   Glimepiride    Upset GI   Glipizide    Upset GI   Glyburide-metformin    Myalgias   Humalog [insulin Lispro]    Headache, SEVERE HYPERGLYCEMIA   Invokana [canagliflozin]    Yeast infections    Januvia [sitagliptin]    uti   Other    Other reaction(s): Unknown   Reglan [metoclopramide]    unknown   Septra [sulfamethoxazole-trimethoprim] Hives   Spironolactone    unk   Tramadol Other (See Comments)   anxiety   Victoza [liraglutide] Diarrhea   Abdominal pain   Zocor [simvastatin]    unknown   Losartan Potassium Rash   Upset GI   Novolin R [insulin] Swelling, Rash   70/30        Medication List     STOP taking these medications    cefdinir 300 MG capsule Commonly known as: OMNICEF   hydrochlorothiazide 25 MG tablet Commonly known as: HYDRODIURIL   insulin regular 100 units/mL injection Commonly known as: NOVOLIN R       TAKE these medications    acetaminophen 325 MG tablet Commonly known as: TYLENOL Take 2 tablets (650 mg total) by mouth every 6 (six) hours as needed for mild pain (or temp > 37.5 C (99.5 F)).   aspirin EC 81 MG tablet Take 81 mg by mouth daily.   atorvastatin 80 MG tablet Commonly known as: LIPITOR Take 1 tablet (80 mg total) by mouth daily. Start taking on: August 09, 2020 What changed:  medication strength how much to take when to take this   carvedilol 3.125 MG tablet Commonly known as: COREG Take 1 tablet (3.125 mg total) by mouth 2 (two) times daily with a meal.   clopidogrel 75 MG tablet Commonly known as: PLAVIX Take 1 tablet (75 mg total) by mouth daily. Start taking on: August 09, 2020   furosemide 40 MG tablet Commonly known as: LASIX Take 0.5 tablets (20 mg total) by mouth daily. What changed: how much to take   insulin glargine 100 UNIT/ML injection Commonly known as: Lantus Inject 0.1 mLs (10 Units total) into the skin daily. What changed: how much to take   Insulin Syringe-Needle U-100 31G X 5/16" 0.5 ML Misc Commonly known as: RELION INSULIN SYR 0.5ML/31G USE 4x TIMES DAILY   irbesartan 75 MG tablet Commonly known as: AVAPRO Take 0.5 tablets (37.5 mg total) by mouth daily. Start taking on: August 09, 2020   OneTouch Verio test strip Generic drug: glucose blood USE 1 STRIP TO CHECK GLUCOSE 4x TIMES DAILY   OneTouch Verio w/Device Kit 1 each by Does not apply route daily.   pantoprazole 40 MG tablet Commonly known as: PROTONIX Take 1 tablet (40 mg total) by mouth daily. Start taking on: August 09, 2020   Syringe Luer Lock 23G X 1" 3 ML Misc 1 Package by Does not apply route as needed.   vitamin B-12 1000 MCG tablet Commonly known as: CYANOCOBALAMIN Take 1,000 mcg by mouth daily.  Vitamin D3 125 MCG (5000 UT) Tabs Take 1 tablet (5,000 Units total) by mouth once a week. What changed: when to take this        Follow-up Information     [facility]. Schedule an appointment as soon as possible for a visit in 1 month(s).   Specialty: Neurology Why: stroke clinic Contact information: [place] [facility] Follow up.   Specialty: Cardiology Why: Hospital follow-up with Cardiology scheduled for 09/07/2020 at 10am with Laurann Montana, one of Dr. Blenda Mounts NPs. Please arrive 15 minutes early for check-in. If this date/time does not work for you, please call our office to reschedule. Contact information: [address] [place] [address] [phone]        Shirline Frees, MD Follow up in 10 day(s).   Specialty: Family Medicine Contact information: [place] [address] [phone]                Allergies  Allergen Reactions   Codeine Other (See Comments)    "makes me crazy; see things; delusional"   Erythromycin Other (See Comments)    "peeled skin; like a sunburn & I turn the color of the pill; a kind of purple-look"   Lisinopril Cough    "cause I have asthma"   Penicillins Rash and Other (See Comments)    "puffy blisters   Shellfish Allergy     Throat swells   Sulfonamide  Derivatives Hives    "watery hives"   Metoprolol Diarrhea and Nausea And Vomiting    Rapid weight gain.   Actos [Pioglitazone]     Upset GI   Amlodipine     Syncope   Benicar [Olmesartan] Other (See Comments)    Dizziness and HA   Byetta 10 Mcg Pen [Exenatide] Nausea And Vomiting    5 MCG pen   Gabapentin Other (See Comments)    Anxiety    Glimepiride     Upset GI   Glipizide     Upset GI   Glyburide-Metformin     Myalgias   Humalog [Insulin Lispro]     Headache, SEVERE HYPERGLYCEMIA   Invokana [Canagliflozin]     Yeast infections   Januvia [Sitagliptin]     uti   Other     Other reaction(s): Unknown   Reglan [Metoclopramide]     unknown   Septra [Sulfamethoxazole-Trimethoprim] Hives   Spironolactone     unk   Tramadol Other (See Comments)    anxiety   Victoza [Liraglutide] Diarrhea    Abdominal pain    Zocor [Simvastatin]     unknown   Losartan Potassium Rash    Upset GI   Novolin R [Insulin] Swelling and Rash    70/30    Consultations: Neurology cardiology  Cardiac cath by Dr. Daneen Schick on 08/07/2020 Chronic total occlusion of the mid LAD after first septal perforator. Left to right and right to left collaterals to LAD. Moderate diffuse atherosclerosis noted otherwise without focal high-grade obstruction and RCA, or circumflex. LVEDP 3 mmHg. Right heart pressures were normal.  Procedures/Studies: CT ANGIO HEAD W OR WO CONTRAST  Result Date: 08/02/2020 CLINICAL DATA:  Initial evaluation for neuro deficit, stroke. EXAM: CT ANGIOGRAPHY HEAD AND NECK TECHNIQUE: Multidetector CT imaging of the head and neck was performed using the standard protocol during bolus administration of intravenous contrast. Multiplanar CT image reconstructions and  MIPs were obtained to evaluate the vascular anatomy. Carotid stenosis measurements (when applicable) are obtained utilizing NASCET criteria, using the distal internal carotid diameter as the denominator. CONTRAST:  56m  OMNIPAQUE IOHEXOL 350 MG/ML SOLN COMPARISON:  Prior CT from earlier the same day. FINDINGS: CTA NECK FINDINGS Aortic arch: Visualized aortic arch normal in caliber with normal 3 vessel morphology. Mild atheromatous change about the arch and origin of the great vessels without hemodynamically significant stenosis. Right carotid system: Right common and internal carotid arteries patent without stenosis, dissection or occlusion. Left carotid system: Left common and internal carotid arteries patent without stenosis, dissection, or occlusion. Mild atheromatous change about the left carotid bulb without significant stenosis. Vertebral arteries: Both vertebral arteries arise from subclavian arteries. Left vertebral artery dominant. Vertebral arteries patent without stenosis, dissection, or occlusion. Short-segment fenestration at the right V2/V3 junction noted (series 7, image 168). Skeleton: No visible acute osseous finding. No discrete or worrisome osseous lesions. Other neck: No other acute soft tissue abnormality within the neck. Few scattered thyroid nodules noted, largest of which measures 1.3 cm on the right. Findings felt to be of doubtful significance given size and patient age, no follow-up imaging recommended (ref: J Am Coll Radiol. 2015 Feb;12(2): 143-50).No other mass or adenopathy. Upper chest: Small layering left pleural effusion partially visualized. Visualized upper chest demonstrates no other acute findings. Review of the MIP images confirms the above findings CTA HEAD FINDINGS Anterior circulation: Horizontal petrous left ICA widely patent. Fairly severe atheromatous stenosis of up to 75% present at the or horizontal petrous right ICA (series 7, image 122). Scattered atheromatous change within the carotid siphons distally with associated mild to moderate diffuse narrowing. A1 segments patent bilaterally. Right A1 hypoplastic, accounting for the diminutive right ICA is compared to the left. Normal  anterior communicating artery complex. Anterior cerebral arteries patent to their distal aspects without stenosis. No M1 stenosis or occlusion. Normal MCA bifurcations. Short-segment moderate proximal left M2 stenosis, superior division noted (series 11, image 19). Distal MCA branches well perfused and symmetric. Distal small vessel atheromatous irregularity. Posterior circulation: Both V4 segments patent to the vertebrobasilar junction without stenosis. Left vertebral artery dominant. Right PICA origin patent and normal. Left PICA not seen. Basilar patent to its distal aspect without stenosis. Superior cerebellar arteries patent bilaterally. Both PCAs primarily supplied via the basilar. Left PCA widely patent proximally. Focal severe distal left P2 stenosis noted (series 8, image 117). On the right, there is occlusion at the proximal right P2 segment (series 8, image 97). Scant attenuated flow distally within the right PCA distribution could be related to subocclusive thrombus and/or collateralization. Venous sinuses: Grossly patent allowing for timing the contrast bolus. Anatomic variants: Hypoplastic right A1 segment.  No aneurysm. Review of the MIP images confirms the above findings IMPRESSION: 1. Acute occlusion at the proximal right P2 segment, likely embolic. Scant attenuated flow within the right PCA distribution distally could be related to subocclusive thrombus and/or collateralization. No embolic source seen elsewhere about the major arterial vasculature of the head and neck. 2. Severe 75% stenosis at the horizontal petrous right ICA. 3. Severe distal left P2 stenosis. 4. Additional scattered atheromatous change about the major arterial vasculature of the head and neck as above. No other proximal high-grade or correctable stenosis. 5. Small layering left pleural effusion, partially visualized. Electronically Signed   By: BJeannine BogaM.D.   On: 08/02/2020 22:52   CT HEAD WO CONTRAST  Result  Date: 08/02/2020 CLINICAL DATA:  Mental  status change, unknown cause. Additional history provided: Patient reports "not feeling like herself." Patient reports vision "isn't up to par." Patient reports dizziness and confusion. EXAM: CT HEAD WITHOUT CONTRAST TECHNIQUE: Contiguous axial images were obtained from the base of the skull through the vertex without intravenous contrast. COMPARISON:  Head CT 11/07/2016.  Brain MRI 07/18/2013. FINDINGS: Brain: Mild generalized cerebral and cerebellar atrophy. Abnormal cortical/subcortical hypodensity within the right temporal and occipital lobes measuring 8.9 x 2.6 x 1.4 cm (AP x TV x CC) compatible with acute right PCA territory infarction. Acute infarction changes are also present within the callosal splenium on the right. Chronic lacunar infarct within the left lentiform nucleus not definitively present on the prior head CT of 11/07/2016. Background mild patchy and ill-defined hypoattenuation within the cerebral white matter, nonspecific but compatible chronic small vessel ischemic disease. Redemonstrated nonspecific linear calcifications within the inferior pons (series 3, image 26). There is no acute intracranial hemorrhage. No extra-axial fluid collection. No evidence of an intracranial mass. No midline shift. Vascular: No hyperdense vessel.  Atherosclerotic calcifications. Skull: Normal. Negative for fracture or focal lesion. Sinuses/Orbits: Visualized orbits show no acute finding. Mild bilateral frontal and ethmoid sinus mucosal thickening. These results were called by telephone at the time of interpretation on 08/02/2020 at 5:31 pm to provider PA [facility], who verbally acknowledged these results. IMPRESSION: Large acute cortical/subcortical right PCA territory infarct within the right temporal and occipital lobes, as well as callosal splenium. No significant mass effect at this time. No evidence of hemorrhagic conversion. Chronic lacunar infarct within the left  basal ganglia, not definitively present on the prior head CT of 11/07/2016. Background mild generalized parenchymal atrophy and mild cerebral white matter chronic small vessel ischemic disease. Electronically Signed   By: Kellie Simmering DO   On: 08/02/2020 17:33   CT ANGIO NECK W OR WO CONTRAST  Result Date: 08/02/2020 CLINICAL DATA:  Initial evaluation for neuro deficit, stroke. EXAM: CT ANGIOGRAPHY HEAD AND NECK TECHNIQUE: Multidetector CT imaging of the head and neck was performed using the standard protocol during bolus administration of intravenous contrast. Multiplanar CT image reconstructions and MIPs were obtained to evaluate the vascular anatomy. Carotid stenosis measurements (when applicable) are obtained utilizing NASCET criteria, using the distal internal carotid diameter as the denominator. CONTRAST:  9m OMNIPAQUE IOHEXOL 350 MG/ML SOLN COMPARISON:  Prior CT from earlier the same day. FINDINGS: CTA NECK FINDINGS Aortic arch: Visualized aortic arch normal in caliber with normal 3 vessel morphology. Mild atheromatous change about the arch and origin of the great vessels without hemodynamically significant stenosis. Right carotid system: Right common and internal carotid arteries patent without stenosis, dissection or occlusion. Left carotid system: Left common and internal carotid arteries patent without stenosis, dissection, or occlusion. Mild atheromatous change about the left carotid bulb without significant stenosis. Vertebral arteries: Both vertebral arteries arise from subclavian arteries. Left vertebral artery dominant. Vertebral arteries patent without stenosis, dissection, or occlusion. Short-segment fenestration at the right V2/V3 junction noted (series 7, image 168). Skeleton: No visible acute osseous finding. No discrete or worrisome osseous lesions. Other neck: No other acute soft tissue abnormality within the neck. Few scattered thyroid nodules noted, largest of which measures 1.3 cm on  the right. Findings felt to be of doubtful significance given size and patient age, no follow-up imaging recommended (ref: J Am Coll Radiol. 2015 Feb;12(2): 143-50).No other mass or adenopathy. Upper chest: Small layering left pleural effusion partially visualized. Visualized upper chest demonstrates no other acute findings. Review  of the MIP images confirms the above findings CTA HEAD FINDINGS Anterior circulation: Horizontal petrous left ICA widely patent. Fairly severe atheromatous stenosis of up to 75% present at the or horizontal petrous right ICA (series 7, image 122). Scattered atheromatous change within the carotid siphons distally with associated mild to moderate diffuse narrowing. A1 segments patent bilaterally. Right A1 hypoplastic, accounting for the diminutive right ICA is compared to the left. Normal anterior communicating artery complex. Anterior cerebral arteries patent to their distal aspects without stenosis. No M1 stenosis or occlusion. Normal MCA bifurcations. Short-segment moderate proximal left M2 stenosis, superior division noted (series 11, image 19). Distal MCA branches well perfused and symmetric. Distal small vessel atheromatous irregularity. Posterior circulation: Both V4 segments patent to the vertebrobasilar junction without stenosis. Left vertebral artery dominant. Right PICA origin patent and normal. Left PICA not seen. Basilar patent to its distal aspect without stenosis. Superior cerebellar arteries patent bilaterally. Both PCAs primarily supplied via the basilar. Left PCA widely patent proximally. Focal severe distal left P2 stenosis noted (series 8, image 117). On the right, there is occlusion at the proximal right P2 segment (series 8, image 97). Scant attenuated flow distally within the right PCA distribution could be related to subocclusive thrombus and/or collateralization. Venous sinuses: Grossly patent allowing for timing the contrast bolus. Anatomic variants: Hypoplastic  right A1 segment.  No aneurysm. Review of the MIP images confirms the above findings IMPRESSION: 1. Acute occlusion at the proximal right P2 segment, likely embolic. Scant attenuated flow within the right PCA distribution distally could be related to subocclusive thrombus and/or collateralization. No embolic source seen elsewhere about the major arterial vasculature of the head and neck. 2. Severe 75% stenosis at the horizontal petrous right ICA. 3. Severe distal left P2 stenosis. 4. Additional scattered atheromatous change about the major arterial vasculature of the head and neck as above. No other proximal high-grade or correctable stenosis. 5. Small layering left pleural effusion, partially visualized. Electronically Signed   By: Jeannine Boga M.D.   On: 08/02/2020 22:52   MR BRAIN WO CONTRAST  Result Date: 08/02/2020 CLINICAL DATA:  Initial evaluation for acute stroke. EXAM: MRI HEAD WITHOUT CONTRAST TECHNIQUE: Multiplanar, multiecho pulse sequences of the brain and surrounding structures were obtained without intravenous contrast. COMPARISON:  Prior CT and CTA from earlier the same day. FINDINGS: Brain: Cerebral volume within normal limits for age. Patchy and confluent T2/FLAIR hyperintensity within the periventricular and deep white matter both cerebral hemispheres as well as the pons, most consistent with chronic small vessel ischemic disease, moderate in nature. Few scattered superimposed remote lacunar infarcts noted about the basal ganglia and right thalamus. Patchy restricted diffusion involving the parasagittal right temporal occipital region consistent with a moderate-sized acute right PCA distribution infarct. Mild patchy involvement of the right thalamus and splenium. Minimal associated petechial hemorrhage without frank hemorrhagic transformation or significant mass effect. No other evidence for acute or subacute ischemia. Gray-white matter differentiation otherwise maintained. No  encephalomalacia to suggest chronic cortical infarction elsewhere within the brain. No other evidence for acute or chronic intracranial hemorrhage. No mass lesion, midline shift or mass effect. No hydrocephalus or extra-axial fluid collection. Pituitary gland suprasellar region normal. Midline structures intact. Vascular: Major intracranial vascular flow voids are maintained. Skull and upper cervical spine: Craniocervical junction within normal limits. Bone marrow signal intensity normal. No scalp soft tissue abnormality. Sinuses/Orbits: Patient status post bilateral ocular lens replacement. Right gaze noted. Scattered mucosal thickening noted throughout the paranasal sinuses. No mastoid effusion.  Other: None. IMPRESSION: 1. Moderate-sized acute right PCA distribution infarct as above. Associated faint petechial hemorrhage without frank hemorrhagic transformation or significant mass effect. 2. Underlying moderate chronic microvascular ischemic disease with a few scattered remote lacunar infarcts about the basal ganglia and right thalamus. Electronically Signed   By: Jeannine Boga M.D.   On: 08/02/2020 23:03   MR LUMBAR SPINE WO CONTRAST  Result Date: 07/27/2020 CLINICAL DATA:  Low back pain with right hip and leg pain. Weakness for 6 months. EXAM: MRI LUMBAR SPINE WITHOUT CONTRAST TECHNIQUE: Multiplanar, multisequence MR imaging of the lumbar spine was performed. No intravenous contrast was administered. COMPARISON:  03/24/2020 FINDINGS: Segmentation:  5 lumbar type vertebrae Alignment:  Stable and unremarkable Vertebrae:  No fracture, evidence of discitis, or bone lesion. Conus medullaris and cauda equina: Conus extends to the T12-L1 level. Conus and cauda equina appear normal. Paraspinal and other soft tissues: Stable generalized subcutaneous edematous appearance in the back. Evidence of prior right-sided decompression at this level Disc levels: T12- L1: Unremarkable. L1-L2: Small right paracentral  protrusion L2-L3: Mild disc bulging and facet spurring L3-L4: Disc narrowing and bulging. Facet spurring and ligamentum flavum thickening. Mild spinal stenosis. Stable left subarticular recess narrowing that could affect the left L4 nerve root L4-L5: Disc narrowing and bulging with right paracentral herniation that has enlarged with extrusion appearance that causes high-grade spinal stenosis with severe right L5 impingement. Mild facet spurring. Mild bilateral foraminal narrowing greater on the right L5-S1:Unremarkable. IMPRESSION: 1. L4-5 high-grade spinal and right subarticular recess stenosis due primarily to a large extrusion. 2. L3-4 chronic left subarticular recess stenosis that could affect the left L4 nerve root. Electronically Signed   By: Monte Fantasia M.D.   On: 07/27/2020 07:25   CARDIAC CATHETERIZATION  Result Date: 08/07/2020 Formatting of this result is different from the original.   Mid LAD lesion is 100% stenosed.   2nd Diag lesion is 100% stenosed. Total occlusion of the proximal to mid LAD with left to left and right to left collaterals.  LAD does not wraparound apex.  The first diagonal is large and also fills by collaterals. Irregularities with up to 50% in mid circumflex.  The very distal third obtuse marginal contains segmental 70% stenosis. Left main is widely patent RCA has diffuse 60% mid to distal narrowing after the first acute marginal ostium. Right heart pressures are normal LVEDP is normal RECOMMENDATIONS: Consider MRI to rule out apical thrombus versus empirical long-term anticoagulation therapy for presumed LV thrombus with embolic CVA.   DG Chest Port 1 View  Result Date: 08/03/2020 CLINICAL DATA:  COVID. EXAM: PORTABLE CHEST 1 VIEW COMPARISON:  Chest x-ray dated April 16, 2011. FINDINGS: The heart size and mediastinal contours are within normal limits. Both lungs are clear. The visualized skeletal structures are unremarkable. IMPRESSION: No active disease. Electronically  Signed   By: Titus Dubin M.D.   On: 08/03/2020 14:48   ECHOCARDIOGRAM COMPLETE  Result Date: 08/03/2020    ECHOCARDIOGRAM REPORT   Patient Name:   Yolanda Saunders Date of Exam: 08/03/2020 Medical Rec #:  [id number]         Height:       63.0 in Accession #:    [id number]        Weight:       183.2 lb Date of Birth:  08-Jan-1946         BSA:          1.863 m Patient Age:    1  years          BP:           138/92 mmHg Patient Gender: F                 HR:           71 bpm. Exam Location:  Inpatient Procedure: 2D Echo, Cardiac Doppler and Color Doppler Indications:    Stroke  History:        Patient has prior history of Echocardiogram examinations, most                 recent 10/27/2016. Stroke; Risk Factors:Former Smoker, Diabetes,                 Dyslipidemia and Hypertension.  Sonographer:    Cammy Brochure Referring Phys: [id number] Henderson T TU IMPRESSIONS  1. No left ventricular thrombus is seen with Definity contrast. Left ventricular ejection fraction, by estimation, is 35 to 40%. The left ventricle has moderately decreased function. The left ventricle has no regional wall motion abnormalities. Left ventricular diastolic parameters are consistent with Grade I diastolic dysfunction (impaired relaxation). There is severe hypokinesis of the left ventricular, mid-apical anteroseptal wall and anterior wall. There is mild dyskinesis of the left ventricular, entire apical segment.  2. Right ventricular systolic function is normal. The right ventricular size is normal. Tricuspid regurgitation signal is inadequate for assessing PA pressure.  3. The mitral valve is normal in structure. Trivial mitral valve regurgitation. No evidence of mitral stenosis.  4. The aortic valve is normal in structure. Aortic valve regurgitation is mild. No aortic stenosis is present.  5. The inferior vena cava is normal in size with greater than 50% respiratory variability, suggesting right atrial pressure of 3 mmHg. Comparison(s): Prior  images unable to be directly viewed, comparison made by report only. Changes from prior study are noted. The left ventricular function is significantly worse. The left ventricular wall motion abnormalities are new. FINDINGS  Left Ventricle: No left ventricular thrombus is seen with Definity contrast. Left ventricular ejection fraction, by estimation, is 35 to 40%. The left ventricle has moderately decreased function. The left ventricle has no regional wall motion abnormalities. Severe hypokinesis of the left ventricular, mid-apical anteroseptal wall and anterior wall. Mild dyskinesis of the left ventricular, entire apical segment. The left ventricular internal cavity size was normal in size. There is no left ventricular hypertrophy. Left ventricular diastolic parameters are consistent with Grade I diastolic dysfunction (impaired relaxation).  LV Wall Scoring: The apical lateral segment, apical septal segment, apical inferior segment, and apex are dyskinetic. The mid and distal anterior wall and mid anteroseptal segment are hypokinetic. Right Ventricle: The right ventricular size is normal. No increase in right ventricular wall thickness. Right ventricular systolic function is normal. Tricuspid regurgitation signal is inadequate for assessing PA pressure. Left Atrium: Left atrial size was normal in size. Right Atrium: Right atrial size was normal in size. Pericardium: There is no evidence of pericardial effusion. Mitral Valve: The mitral valve is normal in structure. Trivial mitral valve regurgitation. No evidence of mitral valve stenosis. Tricuspid Valve: The tricuspid valve is normal in structure. Tricuspid valve regurgitation is not demonstrated. No evidence of tricuspid stenosis. Aortic Valve: The aortic valve is normal in structure. Aortic valve regurgitation is mild. No aortic stenosis is present. Aortic valve mean gradient measures 5.0 mmHg. Aortic valve peak gradient measures 9.2 mmHg. Aortic valve area, by  VTI measures 1.81 cm. Pulmonic Valve: The pulmonic valve  was normal in structure. Pulmonic valve regurgitation is not visualized. No evidence of pulmonic stenosis. Aorta: The aortic root is normal in size and structure. Venous: The inferior vena cava is normal in size with greater than 50% respiratory variability, suggesting right atrial pressure of 3 mmHg. IAS/Shunts: No atrial level shunt detected by color flow Doppler.  LEFT VENTRICLE PLAX 2D LVIDd:         5.10 cm  Diastology LVIDs:         3.70 cm  LV e' medial:    4.57 cm/s LV PW:         1.00 cm  LV E/e' medial:  14.4 LV IVS:        1.10 cm  LV e' lateral:   5.11 cm/s LVOT diam:     1.80 cm  LV E/e' lateral: 12.9 LV SV:         54 LV SV Index:   29 LVOT Area:     2.54 cm  RIGHT VENTRICLE          IVC RV Basal diam:  3.20 cm  IVC diam: 1.00 cm LEFT ATRIUM             Index       RIGHT ATRIUM           Index LA diam:        3.50 cm 1.88 cm/m  RA Area:     14.50 cm LA Vol (A2C):   36.7 ml 19.70 ml/m RA Volume:   34.60 ml  18.57 ml/m LA Vol (A4C):   43.0 ml 23.08 ml/m LA Biplane Vol: 40.6 ml 21.79 ml/m  AORTIC VALVE AV Area (Vmax):    1.74 cm AV Area (Vmean):   1.51 cm AV Area (VTI):     1.81 cm AV Vmax:           152.00 cm/s AV Vmean:          105.000 cm/s AV VTI:            0.296 m AV Peak Grad:      9.2 mmHg AV Mean Grad:      5.0 mmHg LVOT Vmax:         104.00 cm/s LVOT Vmean:        62.200 cm/s LVOT VTI:          0.211 m LVOT/AV VTI ratio: 0.71  AORTA Ao Root diam: 3.20 cm Ao Asc diam:  3.40 cm MITRAL VALVE MV Area (PHT): 4.49 cm     SHUNTS MV Decel Time: 169 msec     Systemic VTI:  0.21 m MV E velocity: 65.70 cm/s   Systemic Diam: 1.80 cm MV A velocity: 110.00 cm/s MV E/A ratio:  0.60 Mihai Croitoru MD Electronically signed by Sanda Klein MD Signature Date/Time: 08/03/2020/12:28:56 PM    Final    VAS [place] LOWER EXTREMITY VENOUS (DVT)  Result Date: 08/03/2020  Lower Venous DVT Study Patient Name:  TERRELL SHIMKO  Date of Exam:   08/03/2020  Medical Rec #: [id number]          Accession #:    [id number] Date of Birth: 12/24/1945          Patient Gender: F Patient Age:   74Y Exam Location:  [facility] Procedure:      VAS [place] LOWER EXTREMITY VENOUS (DVT) Referring Phys: [id number] Rosalin Hawking --------------------------------------------------------------------------------  Indications: Embolic stroke, [id number]-[id number].  Comparison Study: No prior studies. Performing Technologist: Apolonio Schneiders  Janalyn Harder RDMS,RVT  Examination Guidelines: A complete evaluation includes B-mode imaging, spectral Doppler, color Doppler, and power Doppler as needed of all accessible portions of each vessel. Bilateral testing is considered an integral part of a complete examination. Limited examinations for reoccurring indications may be performed as noted. The reflux portion of the exam is performed with the patient in reverse Trendelenburg.  +---------+---------------+---------+-----------+----------+--------------+ RIGHT    CompressibilityPhasicitySpontaneityPropertiesThrombus Aging +---------+---------------+---------+-----------+----------+--------------+ CFV      Full           Yes      Yes                                 +---------+---------------+---------+-----------+----------+--------------+ SFJ      Full                                                        +---------+---------------+---------+-----------+----------+--------------+ FV Prox  Full                                                        +---------+---------------+---------+-----------+----------+--------------+ FV Mid   Full                                                        +---------+---------------+---------+-----------+----------+--------------+ FV DistalFull                                                        +---------+---------------+---------+-----------+----------+--------------+ PFV      Full                                                         +---------+---------------+---------+-----------+----------+--------------+ POP      Full           Yes      Yes                                 +---------+---------------+---------+-----------+----------+--------------+ PTV      Full                                                        +---------+---------------+---------+-----------+----------+--------------+ PERO     Full                                                        +---------+---------------+---------+-----------+----------+--------------+  Gastroc  Full                                                        +---------+---------------+---------+-----------+----------+--------------+   +---------+---------------+---------+-----------+----------+--------------+ LEFT     CompressibilityPhasicitySpontaneityPropertiesThrombus Aging +---------+---------------+---------+-----------+----------+--------------+ CFV      Full           Yes      Yes                                 +---------+---------------+---------+-----------+----------+--------------+ SFJ      Full                                                        +---------+---------------+---------+-----------+----------+--------------+ FV Prox  Full                                                        +---------+---------------+---------+-----------+----------+--------------+ FV Mid   Full                                                        +---------+---------------+---------+-----------+----------+--------------+ FV DistalFull                                                        +---------+---------------+---------+-----------+----------+--------------+ PFV      Full                                                        +---------+---------------+---------+-----------+----------+--------------+ POP      Full           Yes      Yes                                  +---------+---------------+---------+-----------+----------+--------------+ PTV      Full                                                        +---------+---------------+---------+-----------+----------+--------------+ PERO     Full                                                        +---------+---------------+---------+-----------+----------+--------------+  Gastroc  Full                                                        +---------+---------------+---------+-----------+----------+--------------+     Summary: RIGHT: - There is no evidence of deep vein thrombosis in the lower extremity.  - No cystic structure found in the popliteal fossa.  LEFT: - There is no evidence of deep vein thrombosis in the lower extremity.  - No cystic structure found in the popliteal fossa.  *See table(s) above for measurements and observations. Electronically signed by Monica Martinez MD on 08/03/2020 at 6:38:36 PM.    Final       Subjective:  He denies any chest pain, shortness of breath, no complaints today, she denies any new focal deficits.  Discharge Exam: Vitals:   08/08/20 0735 08/08/20 0746  BP: (!) 177/93 (!) 166/82  Pulse: 82 79  Resp: 10 15  Temp: 97.7 F (36.5 C) 97.7 F (36.5 C)  SpO2: 96% 97%   Vitals:   08/07/20 2005 08/08/20 0447 08/08/20 0735 08/08/20 0746  BP:  (!) 137/92 (!) 177/93 (!) 166/82  Pulse: 86 75 82 79  Resp:  '18 10 15  ' Temp:  98.7 F (37.1 C) 97.7 F (36.5 C) 97.7 F (36.5 C)  TempSrc:  Oral Oral Oral  SpO2:  95% 96% 97%  Weight:      Height:        General: Pt is alert, awake, not in acute distress, she is with left hemianopsia, speech, but otherwise no focal deficits. Cardiovascular: RRR, S1/S2 +, no rubs, no gallops Respiratory: CTA bilaterally, no wheezing, no rhonchi Abdominal: Soft, NT, ND, bowel sounds + Extremities: no edema, no cyanosis    The results of significant diagnostics from this hospitalization (including imaging,  microbiology, ancillary and laboratory) are listed below for reference.     Microbiology: Recent Results (from the past 240 hour(s))  SARS CORONAVIRUS 2 (TAT 6-24 HRS) Nasopharyngeal Nasopharyngeal Swab     Status: Abnormal   Collection Time: 08/02/20  9:37 PM   Specimen: Nasopharyngeal Swab  Result Value Ref Range Status   SARS Coronavirus 2 POSITIVE (A) NEGATIVE Final    Comment: (NOTE) SARS-CoV-2 target nucleic acids are DETECTED.  The SARS-CoV-2 RNA is generally detectable in upper and lower respiratory specimens during the acute phase of infection. Positive results are indicative of the presence of SARS-CoV-2 RNA. Clinical correlation with patient history and other diagnostic information is  necessary to determine patient infection status. Positive results do not rule out bacterial infection or co-infection with other viruses.  The expected result is Negative.  Fact Sheet for Patients: SugarRoll.be  Fact Sheet for Healthcare Providers: https://www.woods-mathews.com/  This test is not yet approved or cleared by the [place] FDA and  has been authorized for detection and/or diagnosis of SARS-CoV-2 by FDA under an Emergency Use Authorization (EUA). This EUA will remain  in effect (meaning this test can be used) for the duration of the COVID-19 declaration under Section 564(b)(1) of the Act, 21 U. S.C. section 360bbb-3(b)(1), unless the authorization is terminated or revoked sooner.   Performed at [facility], [place] [address] [place], [place] [address]   MRSA Next Gen by PCR, Nasal     Status: None   Collection Time: 08/03/20  1:28 AM  Specimen: Nasal Mucosa; Nasal Swab  Result Value Ref Range Status   MRSA by PCR Next Gen NOT DETECTED NOT DETECTED Final    Comment: (NOTE) The GeneXpert MRSA Assay (FDA approved for NASAL specimens only), is one component of a comprehensive MRSA colonization surveillance program. It  is not intended to diagnose MRSA infection nor to guide or monitor treatment for MRSA infections. Test performance is not FDA approved in patients less than 25 years old. Performed at [facility], [place] [address], [place], [place] [address]      Labs: BNP (last 3 results) No results for input(s): BNP in the last 8760 hours. Basic Metabolic Panel: Recent Labs  Lab 08/02/20 1616 08/02/20 1654 08/04/20 0022 08/06/20 1103 08/07/20 1030 08/07/20 1047 08/07/20 1136 08/08/20 0051  NA 136 138 138 138 142  141 139  --  138  K 3.8 3.8 3.3* 4.3 4.0  4.0 4.0  --  4.3  CL 102 104 105 104  --   --   --  108  CO2 23  --  27 24  --   --   --  21*  GLUCOSE 141* 143* 115* 150*  --   --   --  115*  BUN '15 18 17 16  ' --   --   --  18  CREATININE 1.22* 1.20* 1.33* 1.25*  --   --  1.24* 1.24*  CALCIUM 9.6  --  9.2 9.6  --   --   --  9.6   Liver Function Tests: Recent Labs  Lab 08/02/20 1616  AST 15  ALT 16  ALKPHOS 57  BILITOT 0.9  PROT 6.5  ALBUMIN 3.6   No results for input(s): LIPASE, AMYLASE in the last 168 hours. No results for input(s): AMMONIA in the last 168 hours. CBC: Recent Labs  Lab 08/02/20 1616 08/02/20 1654 08/04/20 0022 08/07/20 1030 08/07/20 1047 08/07/20 1136 08/08/20 0051  WBC 7.1  --  6.5  --   --  6.9 7.4  NEUTROABS 5.2  --   --   --   --   --   --   HGB 14.4   < > 13.3 13.9  13.9 12.9 13.7 13.5  HCT 43.9   < > 40.0 41.0  41.0 38.0 42.8 40.9  MCV 94.2  --  93.0  --   --  94.1 93.2  PLT 213  --  193  --   --  196 191   < > = values in this interval not displayed.   Cardiac Enzymes: No results for input(s): CKTOTAL, CKMB, CKMBINDEX, TROPONINI in the last 168 hours. BNP: Invalid input(s): POCBNP CBG: Recent Labs  Lab 08/07/20 1135 08/07/20 1634 08/07/20 2138 08/08/20 0746 08/08/20 1157  GLUCAP 162* 109* 141* 101* 123*   D-Dimer No results for input(s): DDIMER in the last 72 hours. Hgb A1c No results for input(s): HGBA1C in the last  72 hours. Lipid Profile No results for input(s): CHOL, HDL, LDLCALC, TRIG, CHOLHDL, LDLDIRECT in the last 72 hours. Thyroid function studies No results for input(s): TSH, T4TOTAL, T3FREE, THYROIDAB in the last 72 hours.  Invalid input(s): FREET3 Anemia work up No results for input(s): VITAMINB12, FOLATE, FERRITIN, TIBC, IRON, RETICCTPCT in the last 72 hours. Urinalysis    Component Value Date/Time   COLORURINE YELLOW 08/02/2020 1604   APPEARANCEUR HAZY (A) 08/02/2020 1604   LABSPEC 1.013 08/02/2020 1604   PHURINE 5.0 08/02/2020 [place] 08/02/2020 1604  HGBUR NEGATIVE 08/02/2020 [place] 08/02/2020 1604   KETONESUR 5 (A) 08/02/2020 1604   PROTEINUR 30 (A) 08/02/2020 1604   UROBILINOGEN 1.0 10/18/2011 0811   NITRITE NEGATIVE 08/02/2020 1604   LEUKOCYTESUR MODERATE (A) 08/02/2020 1604   Sepsis Labs Invalid input(s): PROCALCITONIN,  WBC,  LACTICIDVEN Microbiology Recent Results (from the past 240 hour(s))  SARS CORONAVIRUS 2 (TAT 6-24 HRS) Nasopharyngeal Nasopharyngeal Swab     Status: Abnormal   Collection Time: 08/02/20  9:37 PM   Specimen: Nasopharyngeal Swab  Result Value Ref Range Status   SARS Coronavirus 2 POSITIVE (A) NEGATIVE Final    Comment: (NOTE) SARS-CoV-2 target nucleic acids are DETECTED.  The SARS-CoV-2 RNA is generally detectable in upper and lower respiratory specimens during the acute phase of infection. Positive results are indicative of the presence of SARS-CoV-2 RNA. Clinical correlation with patient history and other diagnostic information is  necessary to determine patient infection status. Positive results do not rule out bacterial infection or co-infection with other viruses.  The expected result is Negative.  Fact Sheet for Patients: SugarRoll.be  Fact Sheet for Healthcare Providers: https://www.woods-mathews.com/  This test is not yet approved or cleared by the  [place] FDA and  has been authorized for detection and/or diagnosis of SARS-CoV-2 by FDA under an Emergency Use Authorization (EUA). This EUA will remain  in effect (meaning this test can be used) for the duration of the COVID-19 declaration under Section 564(b)(1) of the Act, 21 U. S.C. section 360bbb-3(b)(1), unless the authorization is terminated or revoked sooner.   Performed at [facility], [place] [address], [place], [place] [address]   MRSA Next Gen by PCR, Nasal     Status: None   Collection Time: 08/03/20  1:28 AM   Specimen: Nasal Mucosa; Nasal Swab  Result Value Ref Range Status   MRSA by PCR Next Gen NOT DETECTED NOT DETECTED Final    Comment: (NOTE) The GeneXpert MRSA Assay (FDA approved for NASAL specimens only), is one component of a comprehensive MRSA colonization surveillance program. It is not intended to diagnose MRSA infection nor to guide or monitor treatment for MRSA infections. Test performance is not FDA approved in patients less than 32 years old. Performed at [facility], [place] [address], [place], [place] [address]      Time coordinating discharge: Over 30 minutes  SIGNED:   Phillips Climes, MD  [facility] 08/08/2020, 12:16 PM Pager   If 7PM-7AM, please contact night-coverage [email or web address] Password [email or web address]

## 2020-08-08 NOTE — Progress Notes (Addendum)
Ordered outpatient cardiac MRI to rule out LV thrombus as source of acute stroke. Please see rounding note from today for more information.  Corrin Parker, PA-C 08/08/2020 10:01 AM  ADDENDUM: Also ordered 30-Day Event Monitor.  Corrin Parker, PA-C 08/08/2020 5:31 PM

## 2020-08-08 NOTE — Progress Notes (Signed)
Physical Therapy Treatment Patient Details Name: Yolanda Saunders MRN: [id number] DOB: 1945/12/05 Today's Date: 08/08/2020    History of Present Illness 74yo female admitted 08/02/20 with increased confusion, speech changes, and visual changes. Imaging shows large R PCA CVA. Incidentally Covid +. PMH CKD, CVA, fibromyalgia, HTN, IBS, panic attacks, CVA, DM, lumbar surgery 5/22    PT Comments    Pt agreeable to ambulate to use restroom. Pt performs bed mobility without physical assistance. Pt requires cues for safety stand from EOB with RW. Pt tolerates ambulation of greater distances compared to last session, but continues to require cues to attend to L side and with multiple instances of colliding into obstacles in room. Pt limited in further mobility this session due to R hip pain and requests to return to bed. Pt demonstrates deficits in strength, endurance, activity tolerance, pain, balance, and gait and will benefit from continued acute PT services to maximize independence and improve safety with mobility. SPT recommends HHPT to assist with return to prior level.    Follow Up Recommendations  Home health PT;Supervision/Assistance - 24 hour     Equipment Recommendations  Rolling walker with 5" wheels;3in1 (PT)    Recommendations for Other Services       Precautions / Restrictions Precautions Precautions: Fall;Back Precaution Comments: back for comfort Restrictions Weight Bearing Restrictions: No    Mobility  Bed Mobility Overal bed mobility: Modified Independent             General bed mobility comments: HOB elevated, pt sits up to long sitting and pivots to EOB without assist    Transfers Overall transfer level: Needs assistance Equipment used: Rolling walker (2 wheeled) Transfers: Sit to/from Stand Sit to Stand: Min guard         General transfer comment: cues for hand placement to safely stand  Ambulation/Gait Ambulation/Gait assistance: Min guard Gait  Distance (Feet): 40 Feet Assistive device: Rolling walker (2 wheeled) Gait Pattern/deviations: Step-through pattern;Antalgic;Decreased weight shift to right;Decreased stance time - right;Drifts right/left Gait velocity: decreased Gait velocity interpretation: 1.31 - 2.62 ft/sec, indicative of limited community ambulator General Gait Details: pt ambulates to bathroom with RW and multiple instances of colliding into objects, cues given to scan to L and attend to obstacles in her path. Pt declines further mobility secondary to R hip pain.   Stairs             Wheelchair Mobility    Modified Rankin (Stroke Patients Only)       Balance Overall balance assessment: Needs assistance Sitting-balance support: Feet supported Sitting balance-Leahy Scale: Fair     Standing balance support: During functional activity Standing balance-Leahy Scale: Poor Standing balance comment: pt requiring at least single UE support                            Cognition Arousal/Alertness: Awake/alert Behavior During Therapy: WFL for tasks assessed/performed Overall Cognitive Status: Within Functional Limits for tasks assessed                                        Exercises      General Comments General comments (skin integrity, edema, etc.): VSS on RA, pt reports she will be discharged home today      Pertinent Vitals/Pain Pain Assessment: Faces Faces Pain Scale: Hurts a little bit Pain Location: R hip Pain Descriptors /  Indicators: Discomfort Pain Intervention(s): Monitored during session    Home Living                      Prior Function            PT Goals (current goals can now be found in the care plan section) Acute Rehab PT Goals Patient Stated Goal: go home Progress towards PT goals: Progressing toward goals    Frequency    Min 3X/week      PT Plan Current plan remains appropriate    Co-evaluation              AM-PAC PT  "6 Clicks" Mobility   Outcome Measure  Help needed turning from your back to your side while in a flat bed without using bedrails?: None Help needed moving from lying on your back to sitting on the side of a flat bed without using bedrails?: None Help needed moving to and from a bed to a chair (including a wheelchair)?: A Little Help needed standing up from a chair using your arms (e.g., wheelchair or bedside chair)?: A Little Help needed to walk in hospital room?: A Little Help needed climbing 3-5 steps with a railing? : A Lot 6 Click Score: 19    End of Session   Activity Tolerance: Patient limited by pain Patient left: in bed;with call bell/phone within reach;with family/visitor present Nurse Communication: Mobility status PT Visit Diagnosis: Unsteadiness on feet (R26.81);Muscle weakness (generalized) (M62.81);Pain;Difficulty in walking, not elsewhere classified (R26.2) Pain - Right/Left: Right Pain - part of body: Hip     Time: 3491-7915 PT Time Calculation (min) (ACUTE ONLY): 26 min  Charges:  $Gait Training: 8-22 mins $Therapeutic Activity: 8-22 mins                     Acute Rehab  Pager: [phone]    Waldemar Dickens, SPT  08/08/2020, 4:57 PM

## 2020-08-08 NOTE — Progress Notes (Signed)
Pt seen as an inpatient given unexpected admission / cancelled surgery. We discussed it some, will have to come up with a reschedule date and plan for her antiplatelet regimen. Has a new hemianopsia but her foot drop is improved since the last time I saw her, so we have some time if her pain will allow. Will see how her symptoms do as she starts to mobilize again, will have her follow up with me in clinic to discuss.

## 2020-08-08 NOTE — Care Management (Signed)
1426 08-08-20 Case Manger spoke with patient and spouse regarding home health needs and services. Patient and spouse are declining home health services at this time. Case Manager explained that if they are discharged without services, the patient will need to f/u with her primary care provider for services in the future. No durable medical needs noted at this time. MD and Staff RN are aware of plan of care. No further needs identified by Case Manager at this time.

## 2020-08-08 NOTE — Addendum Note (Signed)
Addended by: Marjie Skiff on: 08/08/2020 05:31 PM   Modules accepted: Orders

## 2020-08-09 ENCOUNTER — Encounter: Payer: Self-pay | Admitting: *Deleted

## 2020-08-09 NOTE — Progress Notes (Signed)
Patient ID: Yolanda Saunders, female   DOB: 1946/01/13, 75 y.o.   MRN: [id number] Patient enrolled for [employer] to ship a 30 day cardiac event monitor to her home. Letter with instructions mailed to patient.

## 2020-08-15 DIAGNOSIS — K219 Gastro-esophageal reflux disease without esophagitis: Secondary | ICD-10-CM | POA: Diagnosis not present

## 2020-08-15 DIAGNOSIS — R600 Localized edema: Secondary | ICD-10-CM | POA: Diagnosis not present

## 2020-08-15 DIAGNOSIS — I10 Essential (primary) hypertension: Secondary | ICD-10-CM | POA: Diagnosis not present

## 2020-08-15 DIAGNOSIS — N183 Chronic kidney disease, stage 3 unspecified: Secondary | ICD-10-CM | POA: Diagnosis not present

## 2020-08-15 DIAGNOSIS — Z8673 Personal history of transient ischemic attack (TIA), and cerebral infarction without residual deficits: Secondary | ICD-10-CM | POA: Diagnosis not present

## 2020-08-15 DIAGNOSIS — E7800 Pure hypercholesterolemia, unspecified: Secondary | ICD-10-CM | POA: Diagnosis not present

## 2020-08-16 ENCOUNTER — Ambulatory Visit (INDEPENDENT_AMBULATORY_CARE_PROVIDER_SITE_OTHER): Payer: Medicare Other

## 2020-08-16 DIAGNOSIS — R931 Abnormal findings on diagnostic imaging of heart and coronary circulation: Secondary | ICD-10-CM

## 2020-08-16 DIAGNOSIS — R9431 Abnormal electrocardiogram [ECG] [EKG]: Secondary | ICD-10-CM | POA: Diagnosis not present

## 2020-08-16 DIAGNOSIS — R9430 Abnormal result of cardiovascular function study, unspecified: Secondary | ICD-10-CM

## 2020-08-16 DIAGNOSIS — R000 Tachycardia, unspecified: Secondary | ICD-10-CM

## 2020-08-16 DIAGNOSIS — I63431 Cerebral infarction due to embolism of right posterior cerebral artery: Secondary | ICD-10-CM

## 2020-08-16 DIAGNOSIS — I4891 Unspecified atrial fibrillation: Secondary | ICD-10-CM | POA: Diagnosis not present

## 2020-08-16 DIAGNOSIS — I639 Cerebral infarction, unspecified: Secondary | ICD-10-CM | POA: Diagnosis not present

## 2020-08-20 ENCOUNTER — Other Ambulatory Visit: Payer: Self-pay | Admitting: Internal Medicine

## 2020-09-07 ENCOUNTER — Ambulatory Visit (HOSPITAL_BASED_OUTPATIENT_CLINIC_OR_DEPARTMENT_OTHER): Payer: Medicare Other | Admitting: Family

## 2020-09-07 ENCOUNTER — Other Ambulatory Visit: Payer: Self-pay

## 2020-09-07 ENCOUNTER — Telehealth (HOSPITAL_BASED_OUTPATIENT_CLINIC_OR_DEPARTMENT_OTHER): Payer: Self-pay | Admitting: Family

## 2020-09-07 ENCOUNTER — Encounter (HOSPITAL_BASED_OUTPATIENT_CLINIC_OR_DEPARTMENT_OTHER): Payer: Self-pay | Admitting: Family

## 2020-09-07 VITALS — BP 120/68 | HR 65 | Ht 63.5 in | Wt 170.0 lb

## 2020-09-07 DIAGNOSIS — I5042 Chronic combined systolic (congestive) and diastolic (congestive) heart failure: Secondary | ICD-10-CM | POA: Diagnosis not present

## 2020-09-07 DIAGNOSIS — E785 Hyperlipidemia, unspecified: Secondary | ICD-10-CM | POA: Diagnosis not present

## 2020-09-07 DIAGNOSIS — I10 Essential (primary) hypertension: Secondary | ICD-10-CM

## 2020-09-07 DIAGNOSIS — Z8673 Personal history of transient ischemic attack (TIA), and cerebral infarction without residual deficits: Secondary | ICD-10-CM

## 2020-09-07 DIAGNOSIS — Z8616 Personal history of COVID-19: Secondary | ICD-10-CM

## 2020-09-07 DIAGNOSIS — I25118 Atherosclerotic heart disease of native coronary artery with other forms of angina pectoris: Secondary | ICD-10-CM

## 2020-09-07 DIAGNOSIS — K30 Functional dyspepsia: Secondary | ICD-10-CM

## 2020-09-07 DIAGNOSIS — I5020 Unspecified systolic (congestive) heart failure: Secondary | ICD-10-CM

## 2020-09-07 MED ORDER — CARVEDILOL 3.125 MG PO TABS: 1.5600 mg | ORAL_TABLET | Freq: Two times a day (BID) | ORAL | Status: DC

## 2020-09-07 NOTE — Patient Instructions (Addendum)
Medication Instructions:  Continue your current medications.   *If you need a refill on your cardiac medications before your next appointment, please call your pharmacy*  Lab Work: Your physician recommends that you return for lab work in 2 weeks on 09/21/2020 at [facility] for labs including fasting lipid panel, liver function tests.   If you have labs (blood work) drawn today and your tests are completely normal, you will receive your results only by: MyChart Message (if you have MyChart) OR A paper copy in the mail If you have any lab test that is abnormal or we need to change your treatment, we will call you to review the results.   Testing/Procedures: You have your cardiac MRI scheduled for 09/27/2020. Alver Sorrow, NP will reach out to Dr. Duke Salvia about anxiety medication prior to this procedure and we will call you with recommendaitons.    Follow-Up: At [facility], you and your health needs are our priority.  As part of our continuing mission to provide you with exceptional heart care, we have created designated Provider Care Teams.  These Care Teams include your primary Cardiologist (physician) and Advanced Practice Providers (APPs -  Physician Assistants and Nurse Practitioners) who all work together to provide you with the care you need, when you need it.  We recommend signing up for the patient portal called "MyChart".  Sign up information is provided on this After Visit Summary.  MyChart is used to connect with patients for Virtual Visits (Telemedicine).  Patients are able to view lab/test results, encounter notes, upcoming appointments, etc.  Non-urgent messages can be sent to your provider as well.   To learn more about what you can do with MyChart, go to [email or web address].    Your next appointment:   4-6 weeks  The format for your next appointment:   In Person  Provider:   Chilton Si, MD or Alver Sorrow, NP    Other Instructions  To  prevent or reduce lower extremity swelling: Eat a low salt diet. Salt makes the body hold onto extra fluid which causes swelling. Sit with legs elevated. For example, in the recliner or on an ottoman.  Wear knee-high compression stockings during the daytime. Ones labeled 15-20 mmHg provide good compression.   Heart Healthy Diet Recommendations: A low-salt diet is recommended. Meats should be grilled, baked, or boiled. Avoid fried foods. Focus on lean protein sources like fish or chicken with vegetables and fruits. The American Heart Association is a Chief Technology Officer!  Recommend weighing daily and keeping a log. Please call our office if you have weight gain of 2 pounds overnight or 5 pounds in 1 week.   Date  Time Weight

## 2020-09-07 NOTE — Progress Notes (Signed)
Office Visit    Patient Name: Yolanda Saunders Date of Encounter: 09/07/2020  PCP:  Shirline Frees, MD   [facility]  Cardiologist:  Skeet Latch, MD  Advanced Practice Provider:  No care team member to display Electrophysiologist:  Vickie Epley, MD      Chief Complaint    Yolanda Saunders is a 75 y.o. female with a hx of HTN, DM2, HLD, CKD3, embolic stroke, COVID 76, HFrEF, CAD, back pain presents today for follow up after hospitalization  Past Medical History    Past Medical History:  Diagnosis Date   Anemia 04/17/11   "long, long years ago"   Angina 04/16/11   "that's what I'm here for"   Anxiety    Arthritis    Hips and knees   Asthma    Carpal tunnel syndrome    Cataracts, bilateral    CKD (chronic kidney disease), stage III ([place])    Complication of anesthesia 1987   "affected my eyes; couldn't see anything but blurrs even the next day"   CVA (cerebral infarction)    After cardiac catheter 02/2000   Depression    Edema    Fibromyalgia    GERD (gastroesophageal reflux disease)    Headache(784.0)    High cholesterol    History of bronchitis    Hypertension    IBS (irritable bowel syndrome)    Migraines    Panic attacks 04/17/11   "don't take anything for it"   Pneumonia 04/17/11   "probably as many as 7 times"   Renal disorder 04/17/11   "they are working at 60% capacity"   Shortness of breath on exertion    "cause of my asthma"   Stroke ([place]) 2002   residual "problem w/using the right word, left 5 lesions on my brain/MRI; long term memory loss"   Type II diabetes mellitus ([place])    Past Surgical History:  Procedure Laterality Date   CARDIAC CATHETERIZATION  2002   CARPAL TUNNEL RELEASE  2003-2010   "twice on left; once on right"   [place]  2010   EYE SURGERY Bilateral 11/2019   Lenses Implant   histerectomy     LUMBAR LAMINECTOMY/ DECOMPRESSION WITH MET-RX Right 06/01/2020    Procedure: Right Lumbar Four-Five Minimally invasive discectomy;  Surgeon: Judith Part, MD;  Location: [facility];  Service: Neurosurgery;  Laterality: Right;   RIGHT/LEFT HEART CATH AND CORONARY ANGIOGRAPHY N/A 08/07/2020   Procedure: RIGHT/LEFT HEART CATH AND CORONARY ANGIOGRAPHY;  Surgeon: Belva Crome, MD;  Location: [facility];  Service: Cardiovascular;  Laterality: N/A;   VAGINAL HYSTERECTOMY  1977    Allergies  Allergies  Allergen Reactions   Codeine Other (See Comments)    "makes me crazy; see things; delusional"   Erythromycin Other (See Comments)    "peeled skin; like a sunburn & I turn the color of the pill; a kind of purple-look"   Lisinopril Cough    "cause I have asthma"   Penicillins Rash and Other (See Comments)    "puffy blisters   Shellfish Allergy     Throat swells   Sulfonamide Derivatives Hives    "watery hives"   Metoprolol Diarrhea and Nausea And Vomiting    Rapid weight gain.   Actos [Pioglitazone]     Upset GI   Amlodipine     Syncope   Benicar [Olmesartan] Other (See Comments)    Dizziness and  HA   Byetta 10 Mcg Pen [Exenatide] Nausea And Vomiting    5 MCG pen   Gabapentin Other (See Comments)    Anxiety    Glimepiride     Upset GI   Glipizide     Upset GI   Glyburide-Metformin     Myalgias   Humalog [Insulin Lispro]     Headache, SEVERE HYPERGLYCEMIA   Invokana [Canagliflozin]     Yeast infections   Januvia [Sitagliptin]     uti   Other     Other reaction(s): Unknown   Reglan [Metoclopramide]     unknown   Septra [Sulfamethoxazole-Trimethoprim] Hives   Spironolactone     unk   Tramadol Other (See Comments)    anxiety   Victoza [Liraglutide] Diarrhea    Abdominal pain    Zocor [Simvastatin]     unknown   Losartan Potassium Rash    Upset GI   Novolin R [Insulin] Swelling and Rash    70/30    History of Present Illness    Yolanda Saunders is a 75 y.o. female with a hx of HTN, DM2, HLD, CKD3, embolic stroke,  COVID 19, HFrEF, CAD, back pain last seen while hospitalized.  Admitted 08/02/20 with acute CVA. CTA head and neck with acute right P2 occlusion with severe 75% stenosis in right PCA with severe distal left P2 stenosis. MRI brain with moderate size acute right PCA distribution infarct, underlying moderate chronic microvascular ischemic disease, few scattered remote lacunar infarct in basal ganglia and thalamus. Echo during admission LVEF 35-40%. Tested positive for COVID19 during admission and treated with remdesivir. Underwent cardiac cath for evaluation of reduced LVEF with CTO of mid LAD with left to right and right to left collaterals, moderate diffuse atherosclerosis. She was discharged on low dose Carvedilol and Avapro. Noted ot have multiple medication intolerances. Discharged with 30 day monitor as not loop candidate. She was recommended for outpatient cardiac MRI to rule out LV thrombus. LDL during admission 221, suspected not to be taking Lipitor at home and dose was increased from 74m to 883m   She presents today for follow up with her husband. Tells me she has been having more problems with her back since being home and needs surgery. During visit she and her husband are rather insistent that surgery be completed urgently and we discussed the importance of working up her cardiac conditions to ensure safety of surgery. Notes no chest pain more tightness . Notes some indigestion after meals. Does eat small meals, not on any antireflux medication and not interested in initiating. Does eat tomatos frequently at home which we discussed are high in acid. Blood sugar at home has been "very low" such as 124 and she will skip a dose of insulin, plans to discuss with her PCP. She is wearing her heart monitor without difficulty, denies palpitations. Since discharge appears she has stopped Irbesartan but unclear why. Another provider reduced her Coreg to 1.5625mID which per her report has resolved the previous  ringing in her ears and headache she was having with higher dose. She is concerned about being claustrophobic during upcoming MRI as she has required anxiolytics prior to previous MRIs. Unable to even have door closed in the exam room today.  EKGs/Labs/Other Studies Reviewed:   The following studies were reviewed today:    Echocardiogram 08/03/2020: Impressions: 1. No left ventricular thrombus is seen with Definity contrast. Left  ventricular ejection fraction, by estimation, is 35 to 40%. The left  ventricle has moderately decreased function. The left ventricle has no  regional wall motion abnormalities. Left  ventricular diastolic parameters are consistent with Grade I diastolic  dysfunction (impaired relaxation). There is severe hypokinesis of the left  ventricular, mid-apical anteroseptal wall and anterior wall. There is mild  dyskinesis of the left  ventricular, entire apical segment.   2. Right ventricular systolic function is normal. The right ventricular  size is normal. Tricuspid regurgitation signal is inadequate for assessing  PA pressure.   3. The mitral valve is normal in structure. Trivial mitral valve  regurgitation. No evidence of mitral stenosis.   4. The aortic valve is normal in structure. Aortic valve regurgitation is  mild. No aortic stenosis is present.   5. The inferior vena cava is normal in size with greater than 50%  respiratory variability, suggesting right atrial pressure of 3 mmHg.   Comparison(s): Prior images unable to be directly viewed, comparison made  by report only. Changes from prior study are noted. The left ventricular  function is significantly worse. The left ventricular wall motion  abnormalities are new. _______________   Right/Left Cardiac Catheterization 08/07/2020:   Mid LAD lesion is 100% stenosed.   2nd Diag lesion is 100% stenosed.   Total occlusion of the proximal to mid LAD with left to left and right to left collaterals.  LAD  does not wraparound apex.  The first diagonal is large and also fills by collaterals. Irregularities with up to 50% in mid circumflex.  The very distal third obtuse marginal contains segmental 70% stenosis. Left main is widely patent RCA has diffuse 60% mid to distal narrowing after the first acute marginal ostium. Right heart pressures are normal LVEDP is normal   Recommendations: Consider MRI to rule out apical thrombus versus empirical long-term anticoagulation therapy for presumed LV thrombus with embolic CVA.    EKG:  No EKG today  Recent Labs: 08/02/2020: ALT 16 08/08/2020: BUN 18; Creatinine, Ser 1.24; Hemoglobin 13.5; Platelets 191; Potassium 4.3; Sodium 138  Recent Lipid Panel    Component Value Date/Time   CHOL 278 (H) 08/03/2020 0119   TRIG 137 08/03/2020 0119   HDL 30 (L) 08/03/2020 0119   CHOLHDL 9.3 08/03/2020 0119   VLDL 27 08/03/2020 0119   LDLCALC 221 (H) 08/03/2020 0119   LDLDIRECT 144.0 09/24/2018 1123    Home Medications   Current Meds  Medication Sig   acetaminophen (TYLENOL) 325 MG tablet Take 2 tablets (650 mg total) by mouth every 6 (six) hours as needed for mild pain (or temp > 37.5 C (99.5 F)).   aspirin EC 81 MG tablet Take 81 mg by mouth daily.   atorvastatin (LIPITOR) 80 MG tablet Take 1 tablet (80 mg total) by mouth daily.   Blood Glucose Monitoring Suppl (ONETOUCH VERIO) w/Device KIT 1 each by Does not apply route daily.   carvedilol (COREG) 3.125 MG tablet Take 1 tablet (3.125 mg total) by mouth 2 (two) times daily with a meal. (Patient taking differently: Take 3.125 mg by mouth 2 (two) times daily with a meal. 1/2 tab twice daily)   Cholecalciferol (VITAMIN D3) 125 MCG (5000 UT) TABS Take 1 tablet (5,000 Units total) by mouth once a week.   clopidogrel (PLAVIX) 75 MG tablet Take 1 tablet (75 mg total) by mouth daily.   furosemide (LASIX) 40 MG tablet Take 0.5 tablets (20 mg total) by mouth daily.   glucose blood (ONETOUCH VERIO) test strip USE 1  STRIP TO CHECK GLUCOSE 4x  TIMES DAILY   insulin glargine (LANTUS) 100 UNIT/ML injection Inject 0.1 mLs (10 Units total) into the skin daily.   Insulin Syringe-Needle U-100 (RELION INSULIN SYR 0.5ML/31G) 31G X 5/16" 0.5 ML MISC USE 1  THREE TIMES DAILY   pantoprazole (PROTONIX) 40 MG tablet Take 1 tablet (40 mg total) by mouth daily.   Syringe/Needle, Disp, (SYRINGE LUER LOCK) 23G X 1" 3 ML MISC 1 Package by Does not apply route as needed.   vitamin B-12 (CYANOCOBALAMIN) 1000 MCG tablet Take 1,000 mcg by mouth daily.     Review of Systems      All other systems reviewed and are otherwise negative except as noted above.  Physical Exam    VS:  BP 120/68   Pulse 65   Wt 170 lb (77.1 kg)   SpO2 98%   BMI 30.11 kg/m  , BMI Body mass index is 30.11 kg/m.  Wt Readings from Last 3 Encounters:  09/07/20 170 lb (77.1 kg)  08/03/20 183 lb 3.2 oz (83.1 kg)  06/01/20 196 lb (88.9 kg)     GEN: Well nourished, well developed, in no acute distress. HEENT: normal. Neck: Supple, no JVD, carotid bruits, or masses. Cardiac: RRR, no murmurs, rubs, or gallops. No clubbing, cyanosis, edema.  Radials/PT 2+ and equal bilaterally.  Respiratory:  Respirations regular and unlabored, clear to auscultation bilaterally. GI: Soft, nontender, nondistended. MS: No deformity or atrophy. Skin: Warm and dry, no rash. Neuro:  Strength and sensation are intact. Psych: Normal affect.  Assessment & Plan    Combined systolic and diastolic heart failure - Cardiac MRI upcoming 09/27/20. Will route not to Dr. Oval Linsey regarding possible anxiolytic prior to testing given severe claustrophobia. Has previously used Diazepam 96m 30 minutes prior with good result. Grossly euvolemic on exam today. Low salt diet, fluid restriction encouraged. Previous intolerance to Lisinopril, Spironolactone, Losartan, Amlodipine, Metoprolol per patient report. She is tolerating Coreg 1.534mBID and Lasix 2050maily which we will continue. She  has stopped Irbesartan for unclear reason. Given normal BP today and marked concern regarding additional medications, will defer resumption today until upcoming imaging is completed.   CAD - Stable with no anginal symptoms. No indication for ischemic evaluation.  GDMT includes aspirin, atorvastatin, carvedilol. Heart healthy diet and regular cardiovascular exercise encouraged.    History of CVA - Upcoming appointment with neurology. Continue Aspirin, Plavix, Atorvasatin. Presently wearing 30 day [employer] monitor to rule out arrhythmia as etiology.  DM2 - Continue to follow with PCP. Encouraged to call their office regarding reports of hypoglycemia.   Indigestion - not on medical treatment and politely declines addition of PPI. Encouraged to avoid acidic foods such as tomato.  COVID19 - Treated with Remdesivir during recent admission.   Back pain - Discussed the importance of optimization of heart failure and workup with cardiac MRI and 30 day monitor prior to being able to provide clearance. Spouse is very insistent on the need for back surgery as activity very limited.   HTN - BP well controlled. Continue current antihypertensive regimen.    HLD, LDL goal <70 - Atorvastatin increased to 37m20m during recent admission given LDL 221. Repeat FLP/LFT in 2 weeks.  Disposition: Follow up in 4-6 week(s) with Dr. RandOval LinseyAPP.  Signed, CaitLoel Dubonnet 09/07/2020, 10:05 AM [place]

## 2020-09-07 NOTE — Telephone Encounter (Signed)
Patient seen in clinic 09/07/20. She is very claustrophic and unable to have even the exam room door closed during her visit. She has cardiac MRI upcoming 09/27/20. Concern for claustrophobia inhibiting the study. She reports she has had previous MRI of her spine and was provided pre-medication by her back surgeon Dr. Vallarie Mare, does not recall what medication it was but that it was effective.  Called pharmacy who said on 07/25/20 they filled an Rx for Diazepam one 5mg  tablet 30 minutes prior to MRI (2 tablets were provided).   I have called Dr. office and left a VM to inquire what anti-anxiety agent was used in the past. They returned call and confirmed that Diazepam 5mg  was utilized agent.  Will route to Dr. Marcy Siren for her input. Cardiac MRI is scheduled for 09/27/2020.  Duke Salvia, NP

## 2020-09-10 ENCOUNTER — Telehealth: Payer: Self-pay | Admitting: Family

## 2020-09-10 ENCOUNTER — Telehealth (HOSPITAL_BASED_OUTPATIENT_CLINIC_OR_DEPARTMENT_OTHER): Payer: Self-pay | Admitting: Cardiovascular Disease

## 2020-09-10 NOTE — Telephone Encounter (Signed)
Called pt in regards to low BP.  Pt request that I speak with Joyce Gross (family member); pt gave verbal consent for me to speak with Joyce Gross.  Pt reports she won't remember our conversation.  Joyce Gross reports that pt was having increased anxiety and took an old antianxiety med.  She reports pt became lightheaded and BP low 105/47.  Pt was given salty chips and a salad with ham to help raise BP.  I also advised to increase fluid intake and elevate legs.  Joyce Gross is requesting a med to help with anxiety.  I gave Joyce Gross the number to PCP and told her to f/u.  I advised her to recheck BP in an hour to ensure that it increases.  I also advised her not to give pt any further pills as med is not on medication list.  She reports that pt will not take anymore.  All questions answered.

## 2020-09-10 NOTE — Telephone Encounter (Signed)
Pt c/o BP issue: STAT if pt c/o blurred vision, one-sided weakness or slurred speech  1. What are your last 5 BP readings? 105/47  2. Are you having any other symptoms (ex. Dizziness, headache, blurred vision, passed out)? Lightheaded when she stands up  3. What is your BP issue? Patient's daughter states the patient is having extreme anxiety. She states she took an anxiety medication, clonidine, but it lowered her BP. She says it was an old prescription. She would like to know if there is an anxiety medication the patient can be prescribed. She says the patient's anxiety has kept her from sleeping. She she did give the patient some chips to help her BP get higher.

## 2020-09-10 NOTE — Telephone Encounter (Signed)
Called and offered patient earlier date and time for her Cardiac MRI that is scheduled 09/27/20 at 12:00pm---could schedule 09/20/20 at 8:00 am---patient states she will keep what she has--she does not want a 8:00 am appointment.

## 2020-09-12 ENCOUNTER — Telehealth (HOSPITAL_BASED_OUTPATIENT_CLINIC_OR_DEPARTMENT_OTHER): Payer: Self-pay | Admitting: Family

## 2020-09-12 DIAGNOSIS — F419 Anxiety disorder, unspecified: Secondary | ICD-10-CM

## 2020-09-12 MED ORDER — DIAZEPAM 5 MG PO TABS: ORAL_TABLET | ORAL | 0 refills | Status: DC

## 2020-09-12 NOTE — Telephone Encounter (Signed)
      Patient Name: Yolanda Saunders  DOB: 1945/05/14 MRN: [id number]  Primary Cardiologist: Chilton Si, MD  Case discussed with Dr. Duke Salvia in clinic.   Okay to Rx Valium 5mg  one tablet thirty minutes prior to cardiac MRI scheduled 09/27/2020 with additional tablet as-needed. Rx sent to [facility].  Per [facility]. [facility], may proceed with surgical procedure for disc herniation with Dr. Duke Salvia.  Yolanda Saunders would be at acceptable risk for the planned procedure without further cardiovascular testing. May complete surgery prior to cardiac MRI. Per Dr. Laural Benes plan is to continue DAPT of Plavix/Aspirin in the perioperative period given her recent thrombotic event.  Called patient to make aware of plan of care. Was able to speak with husband per DPR. I will route this recommendation to Dr. Maurice Saunders via Epic fax function and staff message.  Please call with questions.  Yolanda Small, NP 09/12/2020, 12:55 PM

## 2020-09-12 NOTE — Telephone Encounter (Signed)
See phone note 09/12/20.  Discussed with Dr. Duke Salvia. Rx for Valvium 5mg  30 minutes prior to cardiac MRI provided.   , NP

## 2020-09-12 NOTE — Telephone Encounter (Signed)
-----   Message from Jadene Pierini, MD sent at 09/10/2020  2:36 PM EDT ----- Regarding: RE: Cardiac MRI Got it.   Typically we let DAPT fall off for 5d preop and restart a week or so post-op. But with two thrombotic events I'm very anxious about her, will likely continue them both perioperatively and if hemostasis is too poor intra-op, cancel and bridge her with something short acting and try again a few days later.   ----- Message ----- From: Alver Sorrow, NP Sent: 09/10/2020   2:36 PM EDT To: Chilton Si, MD, Jadene Pierini, MD Subject: RE: Cardiac MRI                                Hi Dr. Maurice Small,  The main concern with the cardiac MRI is we were unable to rule out LV thrombus by imaging in the hospital. If thrombus noted, will require anticoagulation.   She has significant surgical risk given heart failure, CVA, CAD. I'll reach out to our scheduling team to see if her cardiac MRI can be moved sooner.   Dr. Duke Salvia is in the clinic tomorrow morning and I can discuss surgical plan to get her input as well. The plan for your procedure is repeat right lumbar 4-5 minimally invasive discectomy, correct? She is presently on Plavix and Aspirin which I anticipate you would need to hold prior to surgery - do you have a typical length of time those are held for this procedure?  Everlena Cooper ----- Message ----- From: Jadene Pierini, MD Sent: 09/10/2020   2:24 PM EDT To: Alver Sorrow, NP Subject: Cardiac MRI                                    For Mrs. Montalvo' pending cardiac MRI, is the imaging going to provide any information that will significantly change treatment prior to her having her disc herniation treated? She has active foot weakness from the nerve being injured. So the longer we wait, the lower likelihood of the nerve recovering (and therefore her recovering function in that foot). Plus she's using a wheelchair to get around, so waiting 6 weeks seems cruel  unless the scan is going to show something that could make a meaningful impact on her care. But I also have never even ordered a cardiac MRI, so I wanted to see if I am missing something. Thanks  Maisie Fus

## 2020-09-18 ENCOUNTER — Ambulatory Visit: Payer: Medicare Other | Admitting: Adult Health

## 2020-09-18 ENCOUNTER — Other Ambulatory Visit: Payer: Self-pay

## 2020-09-18 ENCOUNTER — Encounter: Payer: Self-pay | Admitting: Adult Health

## 2020-09-18 VITALS — BP 126/67 | HR 75 | Ht 63.0 in | Wt 165.0 lb

## 2020-09-18 DIAGNOSIS — I69398 Other sequelae of cerebral infarction: Secondary | ICD-10-CM | POA: Diagnosis not present

## 2020-09-18 DIAGNOSIS — I69319 Unspecified symptoms and signs involving cognitive functions following cerebral infarction: Secondary | ICD-10-CM | POA: Diagnosis not present

## 2020-09-18 DIAGNOSIS — H547 Unspecified visual loss: Secondary | ICD-10-CM | POA: Diagnosis not present

## 2020-09-18 DIAGNOSIS — I63431 Cerebral infarction due to embolism of right posterior cerebral artery: Secondary | ICD-10-CM | POA: Diagnosis not present

## 2020-09-18 DIAGNOSIS — F418 Other specified anxiety disorders: Secondary | ICD-10-CM | POA: Diagnosis not present

## 2020-09-18 NOTE — Patient Instructions (Signed)
Continue aspirin 81 mg daily and clopidogrel 75 mg daily  and atorvastatin 80mg  daily  for secondary stroke prevention  Continue aspriin until mid October then just plavix alone  Continue to follow up with PCP regarding cholesterol, blood pressure and diabetes management  Maintain strict control of hypertension with blood pressure goal below 130/90, diabetes with hemoglobin A1c goal below 7% and cholesterol with LDL cholesterol (bad cholesterol) goal below 70 mg/dL.   Continue to follow with cardiology routinely as well as ensure follow-up with neurosurgery  Order will be placed to start home health therapy  Please follow-up with your PCP regarding anxiety     Followup in the future with me in 3 months or call earlier if needed       Thank you for coming to see [facility] at [facility]. I hope we have been able to provide you high quality care today.  You may receive a patient satisfaction survey over the next few weeks. We would appreciate your feedback and comments so that we may continue to improve ourselves and the health of our patients.

## 2020-09-18 NOTE — Progress Notes (Signed)
[facility] [address] [place]. [place] [address] [phone]       HOSPITAL FOLLOW UP NOTE  [name] Date of Birth:  1945-07-22 Medical Record Number:  [id number]   Reason for Referral:  hospital stroke follow up    SUBJECTIVE:   CHIEF COMPLAINT:  Chief Complaint  Patient presents with   Follow-up    Rm 3 with spouse Shanon Brow  Pt is having high anxiety, L eye vision complications and some confusion.     HPI:   Yolanda Saunders is a 75 y.o. female with history of hypertension, hyperlipidemia, diabetes, migraine, and CKD stage III admitted on 08/02/2020 for vision changes and confusion for 2 days.  Personally reviewed hospitalization pertinent progress notes, lab work and imaging.  Evaluated by Dr. Erlinda Hong for right large PCA infarct, embolic secondary to unclear source.  CTA head/neck right P2 occlusion, left P2 stenosis and right ICA petrous portion 75% stenosis.  Recommended placement of loop recorder outpatient to evaluate for possible A. Fib due to COVID status however given low EF patient not candidate for loop recorder and recommended 30-day cardiac event monitor.  COVID-positive on admission.  New dx of cardiomyopathy with current EF 35 to 40% and prior EF 50 to 55% 2018 - cards eval requested.  A1c 7.2.  LDL 221.  Recommended DAPT for 3 months then Plavix alone as well as increase home dose atorvastatin from 40 mg to 80 mg daily.  Other stroke risk factors include advanced age, obesity and migraines.  Residual deficits of cognitive impairment and left hemianopia.  Therapies recommended HH OT/SLP/PT  Today, 09/18/2020, Yolanda Saunders is being seen for hospital follow-up accompanied by her husband, Shanon Brow.  Reports residual visual impairment, cognitive difficulties and high anxiety.  She has not participated in any type of therapy since discharge.  Provider at PCP office started anxiety medicine last week (husband unsure of name) but she feels like it  makes anxiety worse - she has f/u with PCP Dr. Kenton Kingfisher next week. Of note, son passed away the week of admission to the hospital which husband believes may be contributing.  Denies any speech or language but has experienced difficulty with comprehension such as reading and short term memory impairment.  Ambulation limited due to back pain - s/p lumbar microdiscectomy 06/01/2020 with plans on additional surgical procedure TBD.  Continues on aspirin and Plavix as well as atorvastatin without side effects. Blood pressure today 126/67. Monitors at home and has been stable.  Completed 30-day cardiac event monitor last week.  Follow-up with cardiology with plans on cardiac MRI 9/8 to evaluate for possible LV thrombus.  No further concerns at this time.       ROS:   14 system review of systems performed and negative with exception of those listed in HPI  PMH:  Past Medical History:  Diagnosis Date   Anemia 04/17/11   "long, long years ago"   Angina 04/16/11   "that's what I'm here for"   Anxiety    Arthritis    Hips and knees   Asthma    Carpal tunnel syndrome    Cataracts, bilateral    CKD (chronic kidney disease), stage III ([place])    Complication of anesthesia 1987   "affected my eyes; couldn't see anything but blurrs even the next day"   CVA (cerebral infarction)    After cardiac catheter 02/2000   Depression    Edema    Fibromyalgia    GERD (  gastroesophageal reflux disease)    Headache(784.0)    High cholesterol    History of bronchitis    Hypertension    IBS (irritable bowel syndrome)    Migraines    Panic attacks 04/17/11   "don't take anything for it"   Pneumonia 04/17/11   "probably as many as 7 times"   Renal disorder 04/17/11   "they are working at 60% capacity"   Shortness of breath on exertion    "cause of my asthma"   Stroke ([place]) 2002   residual "problem w/using the right word, left 5 lesions on my brain/MRI; long term memory loss"   Type II diabetes mellitus ([place])      PSH:  Past Surgical History:  Procedure Laterality Date   CARDIAC CATHETERIZATION  2002   CARPAL TUNNEL RELEASE  2003-2010   "twice on left; once on right"   [place]   EYE SURGERY Bilateral 11/2019   Lenses Implant   histerectomy     LUMBAR LAMINECTOMY/ DECOMPRESSION WITH MET-RX Right 06/01/2020   Procedure: Right Lumbar Four-Five Minimally invasive discectomy;  Surgeon: Judith Part, MD;  Location: [facility];  Service: Neurosurgery;  Laterality: Right;   RIGHT/LEFT HEART CATH AND CORONARY ANGIOGRAPHY N/A 08/07/2020   Procedure: RIGHT/LEFT HEART CATH AND CORONARY ANGIOGRAPHY;  Surgeon: Belva Crome, MD;  Location: [facility];  Service: Cardiovascular;  Laterality: N/A;   VAGINAL HYSTERECTOMY  1977    Social History:  Social History   Socioeconomic History   Marital status: Married    Spouse name: Not on file   Number of children: Not on file   Years of education: Not on file   Highest education level: Not on file  Occupational History   Not on file  Tobacco Use   Smoking status: Former    Packs/day: 0.75    Years: 6.00    Pack years: 4.50    Types: Cigarettes    Quit date: 01/21/1980    Years since quitting: 40.6   Smokeless tobacco: Never  Substance and Sexual Activity   Alcohol use: No    Alcohol/week: 0.0 standard drinks   Drug use: No   Sexual activity: Not Currently  Other Topics Concern   Not on file  Social History Narrative   Lives with husband in a one story home.  Has 2 children.  Retired from [employer].  Education: 12th grade.  Trade schools.    Social Determinants of Health   Financial Resource Strain: Not on file  Food Insecurity: Not on file  Transportation Needs: Not on file  Physical Activity: Not on file  Stress: Not on file  Social Connections: Not on file  Intimate Partner Violence: Not on file    Family History:  Family History  Problem Relation Age of Onset   Coronary  artery disease Father    Diabetes Mellitus I Father    CVA Mother    Stroke Mother    Diabetes Mellitus I Mother    Diabetes Mellitus I Sister    Diabetes Mellitus I Brother    Diabetes Mellitus I Maternal Grandmother    Diabetes Mellitus I Maternal Grandfather    Diabetes Mellitus I Paternal Grandmother    Diabetes Mellitus I Paternal Grandfather    Diabetes Mellitus I Brother    Aortic aneurysm Son     Medications:   Current Outpatient Medications on File Prior to Visit  Medication Sig Dispense Refill  acetaminophen (TYLENOL) 325 MG tablet Take 2 tablets (650 mg total) by mouth every 6 (six) hours as needed for mild pain (or temp > 37.5 C (99.5 F)).     aspirin EC 81 MG tablet Take 81 mg by mouth daily.     atorvastatin (LIPITOR) 80 MG tablet Take 1 tablet (80 mg total) by mouth daily. 30 tablet 0   Blood Glucose Monitoring Suppl (ONETOUCH VERIO) w/Device KIT 1 each by Does not apply route daily. 1 kit 0   carvedilol (COREG) 3.125 MG tablet Take 0.5 tablets (1.56 mg total) by mouth 2 (two) times daily with a meal. 1/2 tab twice daily     Cholecalciferol (VITAMIN D3) 125 MCG (5000 UT) TABS Take 1 tablet (5,000 Units total) by mouth once a week. 30 tablet    clopidogrel (PLAVIX) 75 MG tablet Take 1 tablet (75 mg total) by mouth daily. 30 tablet 0   diazepam (VALIUM) 5 MG tablet Take one tablet 30 minutes prior to cardiac MRI. Take second tablet if needed. 2 tablet 0   furosemide (LASIX) 40 MG tablet Take 0.5 tablets (20 mg total) by mouth daily. 30 tablet    glucose blood (ONETOUCH VERIO) test strip USE 1 STRIP TO CHECK GLUCOSE 4x TIMES DAILY 200 each 11   insulin glargine (LANTUS) 100 UNIT/ML injection Inject 0.1 mLs (10 Units total) into the skin daily. 20 mL 3   Insulin Syringe-Needle U-100 (RELION INSULIN SYR 0.5ML/31G) 31G X 5/16" 0.5 ML MISC USE 1  THREE TIMES DAILY 300 each 0   pantoprazole (PROTONIX) 40 MG tablet Take 1 tablet (40 mg total) by mouth daily. 30 tablet 0    Syringe/Needle, Disp, (SYRINGE LUER LOCK) 23G X 1" 3 ML MISC 1 Package by Does not apply route as needed. 50 each 0   vitamin B-12 (CYANOCOBALAMIN) 1000 MCG tablet Take 1,000 mcg by mouth daily.     No current facility-administered medications on file prior to visit.    Allergies:   Allergies  Allergen Reactions   Codeine Other (See Comments)    "makes me crazy; see things; delusional"   Erythromycin Other (See Comments)    "peeled skin; like a sunburn & I turn the color of the pill; a kind of purple-look"   Lisinopril Cough    "cause I have asthma"   Penicillins Rash and Other (See Comments)    "puffy blisters   Shellfish Allergy     Throat swells   Sulfonamide Derivatives Hives    "watery hives"   Metoprolol Diarrhea and Nausea And Vomiting    Rapid weight gain.   Actos [Pioglitazone]     Upset GI   Amlodipine     Syncope   Benicar [Olmesartan] Other (See Comments)    Dizziness and HA   Byetta 10 Mcg Pen [Exenatide] Nausea And Vomiting    5 MCG pen   Gabapentin Other (See Comments)    Anxiety    Glimepiride     Upset GI   Glipizide     Upset GI   Glyburide-Metformin     Myalgias   Humalog [Insulin Lispro]     Headache, SEVERE HYPERGLYCEMIA   Invokana [Canagliflozin]     Yeast infections   Januvia [Sitagliptin]     uti   Other     Other reaction(s): Unknown   Reglan [Metoclopramide]     unknown   Septra [Sulfamethoxazole-Trimethoprim] Hives   Spironolactone     unk   Tramadol Other (See Comments)  anxiety   Victoza [Liraglutide] Diarrhea    Abdominal pain    Zocor [Simvastatin]     unknown   Losartan Potassium Rash    Upset GI   Novolin R [Insulin] Swelling and Rash    70/30      OBJECTIVE:  Physical Exam  Vitals:   09/18/20 1402  BP: 126/67  Pulse: 75  Weight: 165 lb (74.8 kg)  Height: _0  (1.6 m)   Body mass index is 29.23 kg/m. No results found.   General: well developed, well nourished, pleasant elderly Caucasian female,  seated, in no evident distress Head: head normocephalic and atraumatic.   Neck: supple with no carotid or supraclavicular bruits Cardiovascular: regular rate and rhythm, no murmurs Musculoskeletal: no deformity Skin:  no rash/petichiae Vascular:  Normal pulses all extremities   Neurologic Exam Mental Status: Awake and fully alert.  Fluent speech and language.  Oriented to place and time. Recent memory impaired and remote memory intact. Attention span, concentration and fund of knowledge impaired relying on husband to provide majority of history. Mood and affect appropriate.  Cranial Nerves: Fundoscopic exam reveals sharp disc margins. Pupils equal, briskly reactive to light. Extraocular movements full without nystagmus. Visual fields dense left homonymous hemianopia. Hearing intact. Facial sensation intact. Face, tongue, palate moves normally and symmetrically.  Motor: Normal bulk and tone. Normal strength in all tested extremity muscles Sensory.: intact to touch , pinprick , position and vibratory sensation.  Coordination: Rapid alternating movements normal in all extremities. Finger-to-nose and heel-to-shin performed accurately bilaterally. Gait and Station: Deferred Reflexes: 1+ and symmetric. Toes downgoing.     NIHSS  2 Modified Rankin  3      ASSESSMENT: Yolanda Saunders is a 75 y.o. year old female with recent right large PCA infarct, embolic secondary to unclear source on 08/02/2020 after presenting with visual changes and confusion for 2 days. Vascular risk factors include HTN, HLD, DM, cardiomyopathy, migraines, CAD and COVID-positive during admission.      PLAN:  Right PCA stroke:  Residual deficit: Cognitive impairment and left homonymous hemianopia.  Referral placed to start [facility] OT/SLP. She wishes to defer PT due to back pain and possible upcoming procedure.   30-day cardiac event monitor completed - currently awaiting results Scheduled cardiac MRI 9/8 followed by  cardiology to rule out LV thrombus  Continue aspirin 81 mg daily and clopidogrel 75 mg daily for additional 6 weeks then Plavix alone and continue atorvastatin 80 mg daily for secondary stroke prevention.   Discussed secondary stroke prevention measures and importance of close PCP follow up for aggressive stroke risk factor management. I have gone over the pathophysiology of stroke, warning signs and symptoms, risk factors and their management in some detail with instructions to go to the closest emergency room for symptoms of concern. Anxiety: likely more situational in setting of recent stroke, new heart disease diagnosis, recent passing of her son and continued back pain.  Advised continued follow-up with PCP for management and consider psychotherapy HTN: BP goal <130/90.  Stable on current regimen per PCP HLD: LDL goal <70. Recent LDL 221.  Continue atorvastatin 80 mg daily managed by PCP DMII: A1c goal<7.0. Recent A1c 7.2.  Managed by PCP    Follow up in 3 months or call earlier if needed   CC:  [place] provider: Dr. Leonie Man PCP: Shirline Frees, MD    I spent 56 minutes of face-to-face and non-face-to-face time with patient and husband.  This included previsit chart review including  review of recent hospitalization, lab review, study review, order entry, electronic health record documentation, patient and husband education regarding recent stroke including etiology, secondary stroke prevention measures and importance of managing stroke risk factors, residual deficits and typical recovery time and answered all other questions to patient satisfaction   Frann Rider, AGNP-BC  [facility] [address] [place], [place] [address]  Phone [phone] Fax [phone] Note: This document was prepared with digital dictation and possible smart phrase technology. Any transcriptional errors that result from this process are unintentional.

## 2020-09-19 ENCOUNTER — Other Ambulatory Visit: Payer: Self-pay | Admitting: Student

## 2020-09-19 DIAGNOSIS — R9431 Abnormal electrocardiogram [ECG] [EKG]: Secondary | ICD-10-CM

## 2020-09-19 DIAGNOSIS — R9430 Abnormal result of cardiovascular function study, unspecified: Secondary | ICD-10-CM

## 2020-09-19 DIAGNOSIS — R000 Tachycardia, unspecified: Secondary | ICD-10-CM

## 2020-09-19 DIAGNOSIS — R931 Abnormal findings on diagnostic imaging of heart and coronary circulation: Secondary | ICD-10-CM

## 2020-09-19 DIAGNOSIS — I63431 Cerebral infarction due to embolism of right posterior cerebral artery: Secondary | ICD-10-CM

## 2020-09-21 ENCOUNTER — Other Ambulatory Visit: Payer: Medicare Other

## 2020-09-21 NOTE — Progress Notes (Signed)
I agree with the above plan 

## 2020-09-25 ENCOUNTER — Other Ambulatory Visit: Payer: Medicare Other | Admitting: *Deleted

## 2020-09-25 ENCOUNTER — Other Ambulatory Visit: Payer: Self-pay

## 2020-09-25 DIAGNOSIS — E785 Hyperlipidemia, unspecified: Secondary | ICD-10-CM | POA: Diagnosis not present

## 2020-09-25 DIAGNOSIS — Z8673 Personal history of transient ischemic attack (TIA), and cerebral infarction without residual deficits: Secondary | ICD-10-CM | POA: Diagnosis not present

## 2020-09-25 DIAGNOSIS — I25118 Atherosclerotic heart disease of native coronary artery with other forms of angina pectoris: Secondary | ICD-10-CM | POA: Diagnosis not present

## 2020-09-25 DIAGNOSIS — I5020 Unspecified systolic (congestive) heart failure: Secondary | ICD-10-CM | POA: Diagnosis not present

## 2020-09-25 LAB — LIPID PANEL
Chol/HDL Ratio: 3.5 ratio (ref 0.0–4.4)
Cholesterol, Total: 114 mg/dL (ref 100–199)
HDL: 33 mg/dL — ABNORMAL LOW (ref >39)
LDL Chol Calc (NIH): 59 mg/dL (ref 0–99)
Triglycerides: 123 mg/dL (ref 0–149)
VLDL Cholesterol Cal: 22 mg/dL (ref 5–40)

## 2020-09-25 LAB — HEPATIC FUNCTION PANEL
ALT: 18 IU/L (ref 0–32)
AST: 18 IU/L (ref 0–40)
Albumin: 3.7 g/dL (ref 3.7–4.7)
Alkaline Phosphatase: 68 IU/L (ref 44–121)
Bilirubin Total: 0.6 mg/dL (ref 0.0–1.2)
Bilirubin, Direct: 0.24 mg/dL (ref 0.00–0.40)
Total Protein: 5.6 g/dL — ABNORMAL LOW (ref 6.0–8.5)

## 2020-09-26 NOTE — Progress Notes (Signed)
Left message requesting return call. TDS

## 2020-09-27 ENCOUNTER — Ambulatory Visit (HOSPITAL_COMMUNITY): Admission: RE | Admit: 2020-09-27 | Payer: Medicare Other | Source: Ambulatory Visit

## 2020-10-02 NOTE — Progress Notes (Signed)
Hello, everyone, I just saw the 30 day cardiac event monitoring report showing afib for . Her stroke could be due to PAF. She may need anticoagulation. I do see she has appointment with Dr Duke Salvia on 10/11/20. Not sure if we should start [facility] even before she sees Dr. Duke Salvia. Thanks for consideration.   Marvel Plan, MD PhD Stroke Neurology 10/02/2020 12:03 PM

## 2020-10-03 ENCOUNTER — Other Ambulatory Visit: Payer: Self-pay | Admitting: Neurology

## 2020-10-03 DIAGNOSIS — I480 Paroxysmal atrial fibrillation: Secondary | ICD-10-CM

## 2020-10-03 MED ORDER — APIXABAN 2.5 MG PO TABS: 5.0000 mg | ORAL_TABLET | Freq: Two times a day (BID) | ORAL | Status: DC

## 2020-10-03 MED ORDER — APIXABAN 5 MG PO TABS: 5.0000 mg | ORAL_TABLET | Freq: Two times a day (BID) | ORAL | 1 refills | Status: DC

## 2020-10-03 NOTE — Progress Notes (Signed)
I received outpatient 30-day cardiac monitoring report showing 10 minutes of paroxysmal A. fib.  I called and spoke to the patient and upon request spoke to her husband and explained risk-benefit of anticoagulation with Eliquis and need to stop aspirin and Plavix and change to Eliquis 5 mg twice daily.  We discussed the 2% risk of serious bleeding.  He expressed understanding and I sent a prescription to her listed pharmacy.  She was advised to keep her scheduled appointment with primary care physician [facility] cardiologist Dr. Duke Salvia on  10/11/2020

## 2020-10-09 DIAGNOSIS — E7800 Pure hypercholesterolemia, unspecified: Secondary | ICD-10-CM | POA: Diagnosis not present

## 2020-10-09 DIAGNOSIS — M797 Fibromyalgia: Secondary | ICD-10-CM | POA: Diagnosis not present

## 2020-10-09 DIAGNOSIS — N183 Chronic kidney disease, stage 3 unspecified: Secondary | ICD-10-CM | POA: Diagnosis not present

## 2020-10-09 DIAGNOSIS — I679 Cerebrovascular disease, unspecified: Secondary | ICD-10-CM | POA: Diagnosis not present

## 2020-10-09 DIAGNOSIS — K219 Gastro-esophageal reflux disease without esophagitis: Secondary | ICD-10-CM | POA: Diagnosis not present

## 2020-10-09 DIAGNOSIS — I480 Paroxysmal atrial fibrillation: Secondary | ICD-10-CM | POA: Diagnosis not present

## 2020-10-09 DIAGNOSIS — E1121 Type 2 diabetes mellitus with diabetic nephropathy: Secondary | ICD-10-CM | POA: Diagnosis not present

## 2020-10-09 DIAGNOSIS — I10 Essential (primary) hypertension: Secondary | ICD-10-CM | POA: Diagnosis not present

## 2020-10-09 DIAGNOSIS — R600 Localized edema: Secondary | ICD-10-CM | POA: Diagnosis not present

## 2020-10-10 NOTE — Progress Notes (Incomplete)
Cardiology Office Note:    Date:  10/10/2020   ID:  IONIA SCHEY, DOB [birth year], MRN [id number]  PCP:  Shirline Frees, MD   [facility] Providers Cardiologist:  Skeet Latch, MD Electrophysiologist:  Vickie Epley, MD { Click to update primary MD,subspecialty MD or APP then REFRESH:1}    Referring MD: Shirline Frees, MD   No chief complaint on file.   History of Present Illness:    Yolanda Saunders is a 75 y.o. female with a hx of anemia, angina, anxiety, arthritis, asthma, carpal tunnel, CKD stage 3, CVA following cardiac cath 02/2000, depression, fibromyalgia, GERD, hyperlipidemia, bronchitis, hypertension, IBS, migraines, panic attacks, pneumonia, DOE, and type 2 diabetes mellitus here for post-hospital follow-up.  She was admitted 08/02/2020 with acute CVA. CTA of the head and neck showed acute right P2 occlusion, severe 75% stenosis in right PCA, and severe distal left P2 stenosis. An Echo revealed LVEF 35-40%. During admission she tested positive for Covid 19 and was treated with remdesivir. She was discharged wearing an event monitor.  She saw Laurann Montana, NP 09/07/2020 and her blood pressure was well controlled. She reported increased chest tightness and severe back pain. She continued to wear her event monitor but appeared to have stopped Irbesartan. A Cardiac MRI was scheduled for 09/27/20 but has not been performed. It was noted that she was concerned about her claustrophobia during the future MRI and was unable to have the door closed in the exam room.  Today,  She denies any palpitations, chest pain, or shortness of breath. No lightheadedness, headaches, syncope, orthopnea, or PND. Also has no lower extremity edema or exertional symptoms.   Past Medical History:  Diagnosis Date   Anemia 04/17/11   "long, long years ago"   Angina 04/16/11   "that's what I'm here for"   Anxiety    Arthritis    Hips and knees   Asthma    Carpal tunnel syndrome     Cataracts, bilateral    CKD (chronic kidney disease), stage III ([place])    Complication of anesthesia 1987   "affected my eyes; couldn't see anything but blurrs even the next day"   CVA (cerebral infarction)    After cardiac catheter 02/2000   Depression    Edema    Fibromyalgia    GERD (gastroesophageal reflux disease)    Headache(784.0)    High cholesterol    History of bronchitis    Hypertension    IBS (irritable bowel syndrome)    Migraines    Panic attacks 04/17/11   "don't take anything for it"   Pneumonia 04/17/11   "probably as many as 7 times"   Renal disorder 04/17/11   "they are working at 60% capacity"   Shortness of breath on exertion    "cause of my asthma"   Stroke ([place]) 2002   residual "problem w/using the right word, left 5 lesions on my brain/MRI; long term memory loss"   Type II diabetes mellitus ([place])     Past Surgical History:  Procedure Laterality Date   CARDIAC CATHETERIZATION  2002   CARPAL TUNNEL RELEASE  2003-2010   "twice on left; once on right"   Collins Bilateral 11/2019   Lenses Implant   histerectomy     LUMBAR LAMINECTOMY/ DECOMPRESSION WITH MET-RX Right 06/01/2020   Procedure: Right Lumbar Four-Five Minimally invasive discectomy;  Surgeon: Judith Part, MD;  Location: [facility]  OR;  Service: Neurosurgery;  Laterality: Right;   RIGHT/LEFT HEART CATH AND CORONARY ANGIOGRAPHY N/A 08/07/2020   Procedure: RIGHT/LEFT HEART CATH AND CORONARY ANGIOGRAPHY;  Surgeon: Belva Crome, MD;  Location: [place] CV LAB;  Service: Cardiovascular;  Laterality: N/A;   VAGINAL HYSTERECTOMY  1977    Current Medications: No outpatient medications have been marked as taking for the 10/11/20 encounter (Appointment) with Skeet Latch, MD.     Allergies:   Codeine, Erythromycin, Lisinopril, Penicillins, Shellfish allergy, Sulfonamide derivatives, Metoprolol, Actos [pioglitazone], Amlodipine, Benicar  [olmesartan], Byetta 10 mcg pen [exenatide], Gabapentin, Glimepiride, Glipizide, Glyburide-metformin, Humalog [insulin lispro], Invokana [canagliflozin], Januvia [sitagliptin], Other, Reglan [metoclopramide], Septra [sulfamethoxazole-trimethoprim], Spironolactone, Tramadol, Victoza [liraglutide], Zocor [simvastatin], Losartan potassium, and Novolin r [insulin]   Social History   Socioeconomic History   Marital status: Married    Spouse name: Not on file   Number of children: Not on file   Years of education: Not on file   Highest education level: Not on file  Occupational History   Not on file  Tobacco Use   Smoking status: Former    Packs/day: 0.75    Years: 6.00    Pack years: 4.50    Types: Cigarettes    Quit date: 01/21/1980    Years since quitting: 40.7   Smokeless tobacco: Never  Substance and Sexual Activity   Alcohol use: No    Alcohol/week: 0.0 standard drinks   Drug use: No   Sexual activity: Not Currently  Other Topics Concern   Not on file  Social History Narrative   Lives with husband in a one story home.  Has 2 children.  Retired from [employer].  Education: 12th grade.  Trade schools.    Social Determinants of Health   Financial Resource Strain: Not on file  Food Insecurity: Not on file  Transportation Needs: Not on file  Physical Activity: Not on file  Stress: Not on file  Social Connections: Not on file     Family History: The patient's family history includes Aortic aneurysm in her son; CVA in her mother; Coronary artery disease in her father; Diabetes Mellitus I in her brother, brother, father, maternal grandfather, maternal grandmother, mother, paternal grandfather, paternal grandmother, and sister; Stroke in her mother.  ROS:   Please see the history of present illness.    (+) All other systems reviewed and are negative.  EKGs/Labs/Other Studies Reviewed:    The following studies were reviewed today:  Event Monitor 09/19/2020: 30 Day Event  Monitor   Quality: Fair.  Baseline artifact. Predominant rhythm: sinus rhythm Average heart rate: 63 bpm Max heart rate: 104 bpm Min heart rate: 45 bpm Pauses >2.5 seconds: none   10 minutes atrial fibrillation.  Rate <100 bpm.  R/L Cardiac Cath 08/07/2020:   Mid LAD lesion is 100% stenosed.   2nd Diag lesion is 100% stenosed.   Total occlusion of the proximal to mid LAD with left to left and right to left collaterals.  LAD does not wraparound apex.  The first diagonal is large and also fills by collaterals. Irregularities with up to 50% in mid circumflex.  The very distal third obtuse marginal contains segmental 70% stenosis. Left main is widely patent RCA has diffuse 60% mid to distal narrowing after the first acute marginal ostium. Right heart pressures are normal LVEDP is normal   RECOMMENDATIONS: Consider MRI to rule out apical thrombus versus empirical long-term anticoagulation therapy for presumed LV thrombus with embolic CVA.  [place] LE Venous  DVT 08/03/2020: Summary:  RIGHT:  - There is no evidence of deep vein thrombosis in the lower extremity.  - No cystic structure found in the popliteal fossa.     LEFT:  - There is no evidence of deep vein thrombosis in the lower extremity.  - No cystic structure found in the popliteal fossa.   Echo 08/03/2020: 1. No left ventricular thrombus is seen with Definity contrast. Left  ventricular ejection fraction, by estimation, is 35 to 40%. The left  ventricle has moderately decreased function. The left ventricle has no  regional wall motion abnormalities. Left  ventricular diastolic parameters are consistent with Grade I diastolic  dysfunction (impaired relaxation). There is severe hypokinesis of the left  ventricular, mid-apical anteroseptal wall and anterior wall. There is mild  dyskinesis of the left  ventricular, entire apical segment.   2. Right ventricular systolic function is normal. The right ventricular  size is normal.  Tricuspid regurgitation signal is inadequate for assessing  PA pressure.   3. The mitral valve is normal in structure. Trivial mitral valve  regurgitation. No evidence of mitral stenosis.   4. The aortic valve is normal in structure. Aortic valve regurgitation is  mild. No aortic stenosis is present.   5. The inferior vena cava is normal in size with greater than 50%  respiratory variability, suggesting right atrial pressure of 3 mmHg.   Comparison(s): Prior images unable to be directly viewed, comparison made  by report only. Changes from prior study are noted. The left ventricular  function is significantly worse. The left ventricular wall motion  abnormalities are new.   CTA Head and Neck 08/02/2020: IMPRESSION: 1. Acute occlusion at the proximal right P2 segment, likely embolic. Scant attenuated flow within the right PCA distribution distally could be related to subocclusive thrombus and/or collateralization. No embolic source seen elsewhere about the major arterial vasculature of the head and neck. 2. Severe 75% stenosis at the horizontal petrous right ICA. 3. Severe distal left P2 stenosis. 4. Additional scattered atheromatous change about the major arterial vasculature of the head and neck as above. No other proximal high-grade or correctable stenosis. 5. Small layering left pleural effusion, partially visualized.  Lexiscan Myoview 10/27/2016: Nuclear stress EF: 58%. There was no ST segment deviation noted during stress. Defect 1: There is a medium defect of moderate severity present in the mid anteroseptal, apical septal and apex location. This is a low risk study. The left ventricular ejection fraction is normal (55-65%).   Low risk, probably normal stress nuclear study with septal/apical defect possibly related to conduction abnormality; no ischemia; EF 58 with normal wall motion.  EKG:    10/11/2020: Sinus ***. Rate *** bpm.  Recent Labs: 08/08/2020: BUN 18;  Creatinine, Ser 1.24; Hemoglobin 13.5; Platelets 191; Potassium 4.3; Sodium 138 09/25/2020: ALT 18   Recent Lipid Panel    Component Value Date/Time   CHOL 114 09/25/2020 0951   TRIG 123 09/25/2020 0951   HDL 33 (L) 09/25/2020 0951   CHOLHDL 3.5 09/25/2020 0951   CHOLHDL 9.3 08/03/2020 0119   VLDL 27 08/03/2020 0119   LDLCALC 59 09/25/2020 0951   LDLDIRECT 144.0 09/24/2018 1123     Risk Assessment/Calculations:   {Does this patient have ATRIAL FIBRILLATION?:[phone]}       Physical Exam:    Wt Readings from Last 3 Encounters:  09/18/20 165 lb (74.8 kg)  09/07/20 170 lb (77.1 kg)  08/03/20 183 lb 3.2 oz (83.1 kg)     VS:  There were no vitals taken for this visit. , BMI There is no height or weight on file to calculate BMI. GENERAL:  Well appearing HEENT: Pupils equal round and reactive, fundi not visualized, oral mucosa unremarkable NECK:  No jugular venous distention, waveform within normal limits, carotid upstroke brisk and symmetric, no bruits, no thyromegaly LYMPHATICS:  No cervical adenopathy LUNGS:  Clear to auscultation bilaterally HEART:  RRR.  PMI not displaced or sustained,S1 and S2 within normal limits, no S3, no S4, no clicks, no rubs, *** murmurs ABD:  Flat, positive bowel sounds normal in frequency in pitch, no bruits, no rebound, no guarding, no midline pulsatile mass, no hepatomegaly, no splenomegaly EXT:  2 plus pulses throughout, no edema, no cyanosis no clubbing SKIN:  No rashes no nodules NEURO:  Cranial nerves II through XII grossly intact, motor grossly intact throughout PSYCH:  Cognitively intact, oriented to person place and time   ASSESSMENT:    No diagnosis found. PLAN:    No problem-specific Assessment & Plan notes found for this encounter.    ***   {Are you ordering a CV Procedure (e.g. stress test, cath, DCCV, TEE, etc)?   Press F2        :[id number]}    Disposition: FU with Tiffany C. Oval Linsey, MD, [facility] in ***  Medication  Adjustments/Labs and Tests Ordered: Current medicines are reviewed at length with the patient today.  Concerns regarding medicines are outlined above.   No orders of the defined types were placed in this encounter.  No orders of the defined types were placed in this encounter.   There are no Patient Instructions on file for this visit.    I,Mathew Stumpf,acting as a Education administrator for Skeet Latch, MD.,have documented all relevant documentation on the behalf of Skeet Latch, MD,as directed by  Skeet Latch, MD while in the presence of Skeet Latch, MD.  ***  Signed, Madelin Rear  10/10/2020 8:18 AM    [place]

## 2020-10-11 ENCOUNTER — Ambulatory Visit (HOSPITAL_BASED_OUTPATIENT_CLINIC_OR_DEPARTMENT_OTHER): Payer: Medicare Other | Admitting: Cardiovascular Disease

## 2020-11-06 ENCOUNTER — Other Ambulatory Visit (HOSPITAL_COMMUNITY): Payer: Self-pay | Admitting: Emergency Medicine

## 2020-11-06 DIAGNOSIS — I5042 Chronic combined systolic (congestive) and diastolic (congestive) heart failure: Secondary | ICD-10-CM

## 2020-11-06 DIAGNOSIS — Z8616 Personal history of COVID-19: Secondary | ICD-10-CM

## 2020-11-06 NOTE — Progress Notes (Signed)
H&H added for CMR on thurs. Will call patient to request her to have blood work  Rockwell Alexandria RN Navigator Cardiac Imaging [facility] [phone] Office  [phone] Cell

## 2020-11-07 ENCOUNTER — Telehealth (HOSPITAL_COMMUNITY): Payer: Self-pay | Admitting: Emergency Medicine

## 2020-11-07 NOTE — Telephone Encounter (Signed)
Attempted to call patient regarding upcoming cardiac MR appointment. Left message on voicemail with name and callback number Coleby Yett RN Navigator Cardiac Imaging [facility] [phone] Office [phone] Cell  

## 2020-11-08 ENCOUNTER — Other Ambulatory Visit: Payer: Self-pay

## 2020-11-08 ENCOUNTER — Ambulatory Visit (HOSPITAL_COMMUNITY)
Admission: RE | Admit: 2020-11-08 | Discharge: 2020-11-08 | Disposition: A | Discharge status: Home / Self Care | Payer: Medicare Other | Source: Ambulatory Visit | Attending: Student | Admitting: Student

## 2020-11-08 DIAGNOSIS — I63431 Cerebral infarction due to embolism of right posterior cerebral artery: Secondary | ICD-10-CM | POA: Insufficient documentation

## 2020-11-08 MED ORDER — GADOBUTROL 1 MMOL/ML IV SOLN
10.0000 mL | Freq: Once | INTRAVENOUS | Status: AC | PRN
  Administered 2020-11-08: 10 mL via INTRAVENOUS

## 2020-11-14 ENCOUNTER — Encounter (HOSPITAL_BASED_OUTPATIENT_CLINIC_OR_DEPARTMENT_OTHER): Payer: Self-pay | Admitting: Family

## 2020-11-14 ENCOUNTER — Other Ambulatory Visit: Payer: Self-pay

## 2020-11-14 ENCOUNTER — Ambulatory Visit (HOSPITAL_BASED_OUTPATIENT_CLINIC_OR_DEPARTMENT_OTHER): Payer: Medicare Other | Admitting: Family

## 2020-11-14 VITALS — BP 140/60 | HR 59 | Ht 63.5 in | Wt 161.0 lb

## 2020-11-14 DIAGNOSIS — I5042 Chronic combined systolic (congestive) and diastolic (congestive) heart failure: Secondary | ICD-10-CM

## 2020-11-14 DIAGNOSIS — I480 Paroxysmal atrial fibrillation: Secondary | ICD-10-CM

## 2020-11-14 DIAGNOSIS — D6869 Other thrombophilia: Secondary | ICD-10-CM

## 2020-11-14 DIAGNOSIS — E782 Mixed hyperlipidemia: Secondary | ICD-10-CM

## 2020-11-14 DIAGNOSIS — I10 Essential (primary) hypertension: Secondary | ICD-10-CM | POA: Diagnosis not present

## 2020-11-14 DIAGNOSIS — I4891 Unspecified atrial fibrillation: Secondary | ICD-10-CM

## 2020-11-14 DIAGNOSIS — I25118 Atherosclerotic heart disease of native coronary artery with other forms of angina pectoris: Secondary | ICD-10-CM | POA: Diagnosis not present

## 2020-11-14 MED ORDER — APIXABAN 5 MG PO TABS: 5.0000 mg | ORAL_TABLET | Freq: Two times a day (BID) | ORAL | 5 refills | Status: DC

## 2020-11-14 NOTE — Patient Instructions (Addendum)
Medication Instructions:  Continue your current medications.   *If you need a refill on your cardiac medications before your next appointment, please call your pharmacy*   Lab Work: Your physician recommends that you return for lab work today: BMP, CBC  If you have labs (blood work) drawn today and your tests are completely normal, you will receive your results only by: MyChart Message (if you have MyChart) OR A paper copy in the mail If you have any lab test that is abnormal or we need to change your treatment, we will call you to review the results.   Testing/Procedures: None ordered today. Your recent cardiac MRI showed no blood clot in your heart which is a good result! Your heart pumping function was 38% which is similar compared to your echocardiogram which showed pumping function 35-40%. A normal heart pumping function is 50-65%.   Follow-Up: At [facility], you and your health needs are our priority.  As part of our continuing mission to provide you with exceptional heart care, we have created designated Provider Care Teams.  These Care Teams include your primary Cardiologist (physician) and Advanced Practice Providers (APPs -  Physician Assistants and Nurse Practitioners) who all work together to provide you with the care you need, when you need it.  We recommend signing up for the patient portal called "MyChart".  Sign up information is provided on this After Visit Summary.  MyChart is used to connect with patients for Virtual Visits (Telemedicine).  Patients are able to view lab/test results, encounter notes, upcoming appointments, etc.  Non-urgent messages can be sent to your provider as well.   To learn more about what you can do with MyChart, go to [email or web address].    Your next appointment:   3 month(s)  The format for your next appointment:   In Person  Provider:   Chilton Si, MD   Other Instructions  To prevent or reduce lower extremity  swelling: Eat a low salt diet. Salt makes the body hold onto extra fluid which causes swelling. Sit with legs elevated. For example, in the recliner or on an ottoman.  Wear knee-high compression stockings during the daytime. Ones labeled 15-20 mmHg provide good compression.    Recommend drinking less than 2 liters of fluid per day. This is about 4.5 sixteen ounce bottles of water.   Heart Healthy Diet Recommendations: A low-salt diet is recommended. Meats should be grilled, baked, or boiled. Avoid fried foods. Focus on lean protein sources like fish or chicken with vegetables and fruits. The American Heart Association is a Chief Technology Officer!  American Heart Association Diet and Lifeystyle Recommendations

## 2020-11-14 NOTE — Progress Notes (Signed)
Office Visit    Patient Name: Yolanda Saunders Date of Encounter: 11/14/2020  PCP:  Shirline Frees, MD   [facility]  Cardiologist:  Skeet Latch, MD  Advanced Practice Provider:  No care team member to display Electrophysiologist:  Vickie Epley, MD      Chief Complaint    Yolanda Saunders is a 75 y.o. female with a hx of HTN, DM2, PAF, HLD, CKD3, embolic stroke, COVID 48, HFrEF, CAD, back pain presents today for follow up after MRI  Past Medical History    Past Medical History:  Diagnosis Date   Anemia 04/17/11   "long, long years ago"   Angina 04/16/11   "that's what I'm here for"   Anxiety    Arthritis    Hips and knees   Asthma    Carpal tunnel syndrome    Cataracts, bilateral    CKD (chronic kidney disease), stage III ([place])    Complication of anesthesia 1987   "affected my eyes; couldn't see anything but blurrs even the next day"   CVA (cerebral infarction)    After cardiac catheter 02/2000   Depression    Edema    Fibromyalgia    GERD (gastroesophageal reflux disease)    Headache(784.0)    High cholesterol    History of bronchitis    Hypertension    IBS (irritable bowel syndrome)    Migraines    Panic attacks 04/17/11   "don't take anything for it"   Pneumonia 04/17/11   "probably as many as 7 times"   Renal disorder 04/17/11   "they are working at 60% capacity"   Shortness of breath on exertion    "cause of my asthma"   Stroke ([place]) 2002   residual "problem w/using the right word, left 5 lesions on my brain/MRI; long term memory loss"   Type II diabetes mellitus ([place])    Past Surgical History:  Procedure Laterality Date   CARDIAC CATHETERIZATION  2002   CARPAL TUNNEL RELEASE  2003-2010   "twice on left; once on right"   [place]  2010   EYE SURGERY Bilateral 11/2019   Lenses Implant   histerectomy     LUMBAR LAMINECTOMY/ DECOMPRESSION WITH MET-RX Right 06/01/2020    Procedure: Right Lumbar Four-Five Minimally invasive discectomy;  Surgeon: Judith Part, MD;  Location: [facility];  Service: Neurosurgery;  Laterality: Right;   RIGHT/LEFT HEART CATH AND CORONARY ANGIOGRAPHY N/A 08/07/2020   Procedure: RIGHT/LEFT HEART CATH AND CORONARY ANGIOGRAPHY;  Surgeon: Belva Crome, MD;  Location: [facility];  Service: Cardiovascular;  Laterality: N/A;   VAGINAL HYSTERECTOMY  1977    Allergies  Allergies  Allergen Reactions   Codeine Other (See Comments)    "makes me crazy; see things; delusional"   Erythromycin Other (See Comments)    "peeled skin; like a sunburn & I turn the color of the pill; a kind of purple-look"   Lisinopril Cough    "cause I have asthma"   Penicillins Rash and Other (See Comments)    "puffy blisters   Shellfish Allergy     Throat swells   Sulfonamide Derivatives Hives    "watery hives"   Metoprolol Diarrhea and Nausea And Vomiting    Rapid weight gain.   Actos [Pioglitazone]     Upset GI   Amlodipine     Syncope   Benicar [Olmesartan] Other (See Comments)    Dizziness  and HA   Byetta 10 Mcg Pen [Exenatide] Nausea And Vomiting    5 MCG pen   Gabapentin Other (See Comments)    Anxiety    Glimepiride     Upset GI   Glipizide     Upset GI   Glyburide-Metformin     Myalgias   Humalog [Insulin Lispro]     Headache, SEVERE HYPERGLYCEMIA   Invokana [Canagliflozin]     Yeast infections   Januvia [Sitagliptin]     uti   Other     Other reaction(s): Unknown   Reglan [Metoclopramide]     unknown   Septra [Sulfamethoxazole-Trimethoprim] Hives   Spironolactone     unk   Tramadol Other (See Comments)    anxiety   Victoza [Liraglutide] Diarrhea    Abdominal pain    Zocor [Simvastatin]     unknown   Losartan Potassium Rash    Upset GI   Novolin R [Insulin] Swelling and Rash    70/30    History of Present Illness    Yolanda Saunders is a 75 y.o. female with a hx of HTN, DM2, PAF, HLD, CKD3, embolic stroke,  COVID 19, HFrEF, CAD, back pain last seen 09/07/20.  Admitted 08/02/20 with acute CVA. CTA head and neck with acute right P2 occlusion with severe 75% stenosis in right PCA with severe distal left P2 stenosis. MRI brain with moderate size acute right PCA distribution infarct, underlying moderate chronic microvascular ischemic disease, few scattered remote lacunar infarct in basal ganglia and thalamus. Echo during admission LVEF 35-40%. Tested positive for COVID19 during admission and treated with remdesivir. Underwent cardiac cath for evaluation of reduced LVEF with CTO of mid LAD with left to right and right to left collaterals, moderate diffuse atherosclerosis. She was discharged on low dose Carvedilol and Avapro. Noted ot have multiple medication intolerances. Discharged with 30 day monitor as not loop candidate. She was recommended for outpatient cardiac MRI to rule out LV thrombus. LDL during admission 221, suspected not to be taking Lipitor at home and dose was increased from 70m to 81m   Seen in follow up 09/07/20 with significant back pain. Noted some indigestion after meals but not interested in PPI. She was wearing 30 day monitor which ended up revealing PAF and Eliquis was initiated. Cardiac MRI was ordered and performed 10/2020 showing LVEF 38%, mildly increased left ventricular size, late gadolinium enhancement in the myocardium suggestive of infarction, no LV thrombus, RV normal size and function.  She presents today for follow-up.  Notes occasional chest discomfort at rest which self resolves.  No exertional chest discomfort. BP at home has been 11314H-702Oystolic. She reports some lightheadedness and dizziness. She feels very off balance and weak when she stands.  Has not yet had her repeat back intervention with Dr. OsVenetia Constable She reports stable exertional dyspnea with more than usual activity.  She has been able to walk more as the pain in her back has decreased some.  No edema, orthopnea,  PND.  EKGs/Labs/Other Studies Reviewed:   The following studies were reviewed today:  Cardiac MRI 11/08/20   FINDINGS: 1. Mildly increased left ventricular size, with LVEDD 52 mm, but LVEDVi 95 mL/m2.   Normal left ventricular thickness, with intraventricular septal thickness of 8 mm, posterior wall thickness of 6 mm, and myocardial mass index of 51 g/m2.   Moderately reduced left ventricular systolic function (LVEF = 38%). There are regional wall motion abnormalities: There is hypokinesis of the mid anterior,  anteroseptal, inferoseptal, and inferior segments. There is akinesis of the apex and all apical segments.   Left ventricular parametric mapping notable for normal T2 signal mild increase in the mid LV segments (30-40%) and significant increase in the apex (46-54%) .   There is late gadolinium enhancement in the left ventricular myocardium: There is apical inferior 75 % LGE suggestive of infarction. There is apical anterior septum and anterior remodeling and thinning.   No LV thrombus noted.   2. Normal right ventricular size with RVEDVI 65 mL/m2.   Normal right ventricular thickness.   Normal right ventricular systolic function (RVEF =99%). There are no regional wall motion abnormalities or aneurysms.   3.  Normal left and right atrial size.   4. Normal size of the aortic root, ascending aorta and pulmonary artery.   5.  Qualitatively no significant valvular abnormalities.   6.  Normal pericardium.  No pericardial effusion.   7. Grossly, no extracardiac findings. Recommended dedicated study if concerned for non-cardiac pathology.   IMPRESSION: Ischemic Cardiomyopathy and evidence of LAD infarct. No LV thrombus noted. ____________________________________________  Echocardiogram 08/03/2020: Impressions: 1. No left ventricular thrombus is seen with Definity contrast. Left  ventricular ejection fraction, by estimation, is 35 to 40%. The left  ventricle has  moderately decreased function. The left ventricle has no  regional wall motion abnormalities. Left  ventricular diastolic parameters are consistent with Grade I diastolic  dysfunction (impaired relaxation). There is severe hypokinesis of the left  ventricular, mid-apical anteroseptal wall and anterior wall. There is mild  dyskinesis of the left  ventricular, entire apical segment.   2. Right ventricular systolic function is normal. The right ventricular  size is normal. Tricuspid regurgitation signal is inadequate for assessing  PA pressure.   3. The mitral valve is normal in structure. Trivial mitral valve  regurgitation. No evidence of mitral stenosis.   4. The aortic valve is normal in structure. Aortic valve regurgitation is  mild. No aortic stenosis is present.   5. The inferior vena cava is normal in size with greater than 50%  respiratory variability, suggesting right atrial pressure of 3 mmHg.   Comparison(s): Prior images unable to be directly viewed, comparison made  by report only. Changes from prior study are noted. The left ventricular  function is significantly worse. The left ventricular wall motion  abnormalities are new. _______________   Right/Left Cardiac Catheterization 08/07/2020:   Mid LAD lesion is 100% stenosed.   2nd Diag lesion is 100% stenosed.   Total occlusion of the proximal to mid LAD with left to left and right to left collaterals.  LAD does not wraparound apex.  The first diagonal is large and also fills by collaterals. Irregularities with up to 50% in mid circumflex.  The very distal third obtuse marginal contains segmental 70% stenosis. Left main is widely patent RCA has diffuse 60% mid to distal narrowing after the first acute marginal ostium. Right heart pressures are normal LVEDP is normal   Recommendations: Consider MRI to rule out apical thrombus versus empirical long-term anticoagulation therapy for presumed LV thrombus with embolic CVA.     EKG:  No EKG today  Recent Labs: 08/08/2020: BUN 18; Creatinine, Ser 1.24; Hemoglobin 13.5; Platelets 191; Potassium 4.3; Sodium 138 09/25/2020: ALT 18  Recent Lipid Panel    Component Value Date/Time   CHOL 114 09/25/2020 0951   TRIG 123 09/25/2020 0951   HDL 33 (L) 09/25/2020 0951   CHOLHDL 3.5 09/25/2020 0951   CHOLHDL  9.3 08/03/2020 0119   VLDL 27 08/03/2020 0119   LDLCALC 59 09/25/2020 0951   LDLDIRECT 144.0 09/24/2018 1123    Home Medications   Current Meds  Medication Sig   acetaminophen (TYLENOL) 325 MG tablet Take 2 tablets (650 mg total) by mouth every 6 (six) hours as needed for mild pain (or temp > 37.5 C (99.5 F)).   atorvastatin (LIPITOR) 80 MG tablet Take 1 tablet (80 mg total) by mouth daily.   Blood Glucose Monitoring Suppl (ONETOUCH VERIO) w/Device KIT 1 each by Does not apply route daily.   carvedilol (COREG) 3.125 MG tablet Take 0.5 tablets (1.56 mg total) by mouth 2 (two) times daily with a meal. 1/2 tab twice daily   Cholecalciferol (VITAMIN D3) 125 MCG (5000 UT) TABS Take 1 tablet (5,000 Units total) by mouth once a week.   diazepam (VALIUM) 5 MG tablet Take one tablet 30 minutes prior to cardiac MRI. Take second tablet if needed.   glucose blood (ONETOUCH VERIO) test strip USE 1 STRIP TO CHECK GLUCOSE 4x TIMES DAILY   insulin glargine (LANTUS) 100 UNIT/ML injection Inject 0.1 mLs (10 Units total) into the skin daily.   Insulin Syringe-Needle U-100 (RELION INSULIN SYR 0.5ML/31G) 31G X 5/16" 0.5 ML MISC USE 1  THREE TIMES DAILY   pantoprazole (PROTONIX) 40 MG tablet Take 1 tablet (40 mg total) by mouth daily.   Syringe/Needle, Disp, (SYRINGE LUER LOCK) 23G X 1" 3 ML MISC 1 Package by Does not apply route as needed.   vitamin B-12 (CYANOCOBALAMIN) 1000 MCG tablet Take 1,000 mcg by mouth daily.   [DISCONTINUED] apixaban (ELIQUIS) 5 MG TABS tablet Take 1 tablet (5 mg total) by mouth 2 (two) times daily.     Review of Systems      All other systems reviewed  and are otherwise negative except as noted above.  Physical Exam    VS:  BP 140/60   Pulse (!) 59   Ht 5' 3.5" (1.613 m)   Wt 161 lb (73 kg)   SpO2 98%   BMI 28.07 kg/m  , BMI Body mass index is 28.07 kg/m.  Wt Readings from Last 3 Encounters:  11/14/20 161 lb (73 kg)  09/18/20 165 lb (74.8 kg)  09/07/20 170 lb (77.1 kg)     GEN: Well nourished, well developed, in no acute distress. HEENT: normal. Neck: Supple, no JVD, carotid bruits, or masses. Cardiac: RRR, no murmurs, rubs, or gallops. No clubbing, cyanosis, edema.  Radials/PT 2+ and equal bilaterally.  Respiratory:  Respirations regular and unlabored, clear to auscultation bilaterally. GI: Soft, nontender, nondistended. MS: No deformity or atrophy. Skin: Warm and dry, no rash. Neuro:  Strength and sensation are intact. Psych: Normal affect.  Assessment & Plan    Combined systolic and diastolic heart failure /ischemic cardiomyopathy- Cardiac MRI with no thrombus and LVEF 38%.-Findings consistent with known ischemic cardiomyopathy. Previous intolerance to Lisinopril, Spironolactone, Losartan, Amlodipine, Metoprolol per patient report. She is tolerating Coreg 1.69m BID and Lasix 271mdaily which we will continue.  Reported ringing in her ear with higher dose of carvedilol.  She has stopped Irbesartan for unclear reason.  Blood pressure routinely less than 120/80 at home.  We discussed trial of low-dose irbesartan and she politely declines additional medication changes today.  Low-sodium diet, fluid restriction less than 2 L encouraged.  Consider repeat echocardiogram in approximately 6 months to reassess LVEF.  Optimally would be able to increase guideline directed medical therapy but she is hesitant  regarding medications.  Future considerations include irbesartan or SGLT2 I.    CAD - Stable with no anginal symptoms. No indication for ischemic evaluation.  GDMT includes aspirin, atorvastatin, carvedilol. Heart healthy diet and  regular cardiovascular exercise encouraged.    History of CVA -30-day monitor with evidence of paroxysmal atrial fibrillation.  She has been started on anticoagulation.  Continue to follow with neurology.  Continue optimal BP, A1c control.  DM2 - Continue to follow with PCP.   PAF -reports only rare palpitations. Did not tolerate higher dose Coreg with reports of ringing in her ears and headache.  Continue Eliquis 5 mg twice daily.  Denies bleeding complications.  Update CBC today for monitoring. CHA2DS2-VASc Score = 9 [CHF History: 1, HTN History: 1, Diabetes History: 1, Stroke History: 2, Vascular Disease History: 1, Age Score: 2, Gender Score: 1].  Therefore, the patient's annual risk of stroke is 12.2 %.     Back pain - Clearance previous provided for right lumbar 4-5 minimally invasive disectomy. Some improvement in pain. Encouraged to reach out to neurosurgery. They will need to send new clearance request for pharmacy to review holding Eliquis prior. As she has no new anginal symptoms and cardiac MRI with stable reduced LVEF acceptable risk for planned procedure.   HTN - BP well controlled. Continue current antihypertensive regimen.    HLD, LDL goal <70 -10/15/2020  normal liver enzymes.  LDL 59 at goal of less than 70.  Continue atorvastatin 80 mg daily.  Denies myalgias.  Disposition: Follow up in 3 months with Dr. Oval Linsey or APP.  Signed, Loel Dubonnet, NP 11/14/2020, 7:38 PM [facility]

## 2020-11-15 LAB — BASIC METABOLIC PANEL
BUN/Creatinine Ratio: 13 (ref 12–28)
BUN: 13 mg/dL (ref 8–27)
CO2: 29 mmol/L (ref 20–29)
Calcium: 9.8 mg/dL (ref 8.7–10.3)
Chloride: 101 mmol/L (ref 96–106)
Creatinine, Ser: 1.04 mg/dL — ABNORMAL HIGH (ref 0.57–1.00)
Glucose: 112 mg/dL — ABNORMAL HIGH (ref 70–99)
Potassium: 3.2 mmol/L — ABNORMAL LOW (ref 3.5–5.2)
Sodium: 144 mmol/L (ref 134–144)
eGFR: 55 mL/min/{1.73_m2} — ABNORMAL LOW (ref >59)

## 2020-11-15 LAB — CBC
Hematocrit: 37.3 % (ref 34.0–46.6)
Hemoglobin: 12.2 g/dL (ref 11.1–15.9)
MCH: 30.4 pg (ref 26.6–33.0)
MCHC: 32.7 g/dL (ref 31.5–35.7)
MCV: 93 fL (ref 79–97)
Platelets: 164 10*3/uL (ref 150–450)
RBC: 4.01 x10E6/uL (ref 3.77–5.28)
RDW: 13.8 % (ref 11.7–15.4)
WBC: 6.2 10*3/uL (ref 3.4–10.8)

## 2020-11-16 ENCOUNTER — Other Ambulatory Visit: Payer: Self-pay

## 2020-11-16 DIAGNOSIS — E876 Hypokalemia: Secondary | ICD-10-CM

## 2020-11-16 MED ORDER — POTASSIUM CHLORIDE CRYS ER 20 MEQ PO TBCR: EXTENDED_RELEASE_TABLET | ORAL | 0 refills | Status: DC

## 2020-11-23 ENCOUNTER — Other Ambulatory Visit: Payer: Medicare Other

## 2020-11-28 ENCOUNTER — Other Ambulatory Visit: Payer: Self-pay | Admitting: Internal Medicine

## 2020-12-04 ENCOUNTER — Telehealth (HOSPITAL_BASED_OUTPATIENT_CLINIC_OR_DEPARTMENT_OTHER): Payer: Self-pay

## 2020-12-04 NOTE — Telephone Encounter (Signed)
Called pt. And was able to speak with her husband. Gave updated on Eliquis patient assistance. They are aware it was not approved and are not interested in trying further to get it approved. Gillian Shields NP notified.

## 2020-12-13 ENCOUNTER — Emergency Department (HOSPITAL_COMMUNITY): Payer: Medicare Other

## 2020-12-13 ENCOUNTER — Other Ambulatory Visit: Payer: Self-pay

## 2020-12-13 ENCOUNTER — Encounter (HOSPITAL_COMMUNITY): Payer: Self-pay

## 2020-12-13 ENCOUNTER — Emergency Department (HOSPITAL_COMMUNITY)
Admission: EM | Admit: 2020-12-13 | Discharge: 2020-12-13 | Disposition: A | Discharge status: Home / Self Care | Payer: Medicare Other | Attending: Emergency Medicine | Admitting: Emergency Medicine

## 2020-12-13 DIAGNOSIS — Z87891 Personal history of nicotine dependence: Secondary | ICD-10-CM | POA: Insufficient documentation

## 2020-12-13 DIAGNOSIS — J45909 Unspecified asthma, uncomplicated: Secondary | ICD-10-CM | POA: Insufficient documentation

## 2020-12-13 DIAGNOSIS — S42352A Displaced comminuted fracture of shaft of humerus, left arm, initial encounter for closed fracture: Secondary | ICD-10-CM | POA: Diagnosis not present

## 2020-12-13 DIAGNOSIS — Z79899 Other long term (current) drug therapy: Secondary | ICD-10-CM | POA: Insufficient documentation

## 2020-12-13 DIAGNOSIS — E1122 Type 2 diabetes mellitus with diabetic chronic kidney disease: Secondary | ICD-10-CM | POA: Diagnosis not present

## 2020-12-13 DIAGNOSIS — W19XXXA Unspecified fall, initial encounter: Secondary | ICD-10-CM

## 2020-12-13 DIAGNOSIS — Z043 Encounter for examination and observation following other accident: Secondary | ICD-10-CM | POA: Diagnosis not present

## 2020-12-13 DIAGNOSIS — S42212A Unspecified displaced fracture of surgical neck of left humerus, initial encounter for closed fracture: Secondary | ICD-10-CM

## 2020-12-13 DIAGNOSIS — Z8616 Personal history of COVID-19: Secondary | ICD-10-CM | POA: Insufficient documentation

## 2020-12-13 DIAGNOSIS — S4992XA Unspecified injury of left shoulder and upper arm, initial encounter: Secondary | ICD-10-CM | POA: Diagnosis present

## 2020-12-13 DIAGNOSIS — I5022 Chronic systolic (congestive) heart failure: Secondary | ICD-10-CM | POA: Diagnosis not present

## 2020-12-13 DIAGNOSIS — M47812 Spondylosis without myelopathy or radiculopathy, cervical region: Secondary | ICD-10-CM | POA: Diagnosis not present

## 2020-12-13 DIAGNOSIS — M25512 Pain in left shoulder: Secondary | ICD-10-CM | POA: Diagnosis not present

## 2020-12-13 DIAGNOSIS — W1839XA Other fall on same level, initial encounter: Secondary | ICD-10-CM | POA: Diagnosis not present

## 2020-12-13 DIAGNOSIS — M542 Cervicalgia: Secondary | ICD-10-CM | POA: Diagnosis not present

## 2020-12-13 DIAGNOSIS — I10 Essential (primary) hypertension: Secondary | ICD-10-CM | POA: Diagnosis not present

## 2020-12-13 DIAGNOSIS — R519 Headache, unspecified: Secondary | ICD-10-CM | POA: Insufficient documentation

## 2020-12-13 DIAGNOSIS — R079 Chest pain, unspecified: Secondary | ICD-10-CM | POA: Diagnosis not present

## 2020-12-13 DIAGNOSIS — S42292A Other displaced fracture of upper end of left humerus, initial encounter for closed fracture: Secondary | ICD-10-CM | POA: Diagnosis not present

## 2020-12-13 DIAGNOSIS — N183 Chronic kidney disease, stage 3 unspecified: Secondary | ICD-10-CM | POA: Diagnosis not present

## 2020-12-13 DIAGNOSIS — Y9289 Other specified places as the place of occurrence of the external cause: Secondary | ICD-10-CM | POA: Insufficient documentation

## 2020-12-13 DIAGNOSIS — M25519 Pain in unspecified shoulder: Secondary | ICD-10-CM | POA: Diagnosis not present

## 2020-12-13 DIAGNOSIS — Z794 Long term (current) use of insulin: Secondary | ICD-10-CM | POA: Insufficient documentation

## 2020-12-13 DIAGNOSIS — S42232A 3-part fracture of surgical neck of left humerus, initial encounter for closed fracture: Secondary | ICD-10-CM | POA: Diagnosis not present

## 2020-12-13 DIAGNOSIS — Q899 Congenital malformation, unspecified: Secondary | ICD-10-CM

## 2020-12-13 DIAGNOSIS — M25552 Pain in left hip: Secondary | ICD-10-CM | POA: Diagnosis not present

## 2020-12-13 DIAGNOSIS — Z7901 Long term (current) use of anticoagulants: Secondary | ICD-10-CM | POA: Insufficient documentation

## 2020-12-13 DIAGNOSIS — R6889 Other general symptoms and signs: Secondary | ICD-10-CM | POA: Diagnosis not present

## 2020-12-13 DIAGNOSIS — I130 Hypertensive heart and chronic kidney disease with heart failure and stage 1 through stage 4 chronic kidney disease, or unspecified chronic kidney disease: Secondary | ICD-10-CM | POA: Diagnosis not present

## 2020-12-13 DIAGNOSIS — Z743 Need for continuous supervision: Secondary | ICD-10-CM | POA: Diagnosis not present

## 2020-12-13 DIAGNOSIS — G9389 Other specified disorders of brain: Secondary | ICD-10-CM | POA: Diagnosis not present

## 2020-12-13 MED ORDER — FENTANYL CITRATE PF 50 MCG/ML IJ SOSY
75.0000 ug | PREFILLED_SYRINGE | Freq: Once | INTRAMUSCULAR | Status: AC
  Administered 2020-12-13: 75 ug via INTRAVENOUS
  Filled 2020-12-13: qty 2

## 2020-12-13 MED ORDER — HYDROCODONE-ACETAMINOPHEN 5-325 MG PO TABS: 1.0000 | ORAL_TABLET | Freq: Four times a day (QID) | ORAL | 0 refills | Status: DC | PRN

## 2020-12-13 NOTE — ED Provider Notes (Signed)
[facility] EMERGENCY DEPARTMENT Provider Note   CSN: [id number] Arrival date & time: 12/13/20  1741     History Chief Complaint  Patient presents with   Yolanda Saunders    Yolanda Saunders is a 75 y.o. female.  The history is provided by the patient and the EMS personnel.  Shoulder Pain Location:  Shoulder Shoulder location:  L shoulder Injury: yes   Mechanism of injury: fall   Fall:    Fall occurred:  Standing   Impact surface:  Hard floor   Entrapped after fall: no   Pain details:    Quality:  Aching   Radiates to:  Does not radiate   Severity:  Severe   Onset quality:  Sudden Handedness:  Right-handed Dislocation: no   Prior injury to area:  No Relieved by:  Nothing Worsened by:  Nothing Associated symptoms: no back pain and no fever       Past Medical History:  Diagnosis Date   Anemia 04/17/11   "long, long years ago"   Angina 04/16/11   "that's what I'm here for"   Anxiety    Arthritis    Hips and knees   Asthma    Carpal tunnel syndrome    Cataracts, bilateral    CKD (chronic kidney disease), stage III ([place])    Complication of anesthesia 1987   "affected my eyes; couldn't see anything but blurrs even the next day"   CVA (cerebral infarction)    After cardiac catheter 02/2000   Depression    Edema    Fibromyalgia    GERD (gastroesophageal reflux disease)    Headache(784.0)    High cholesterol    History of bronchitis    Hypertension    IBS (irritable bowel syndrome)    Migraines    Panic attacks 04/17/11   "don't take anything for it"   Pneumonia 04/17/11   "probably as many as 7 times"   Renal disorder 04/17/11   "they are working at 60% capacity"   Shortness of breath on exertion    "cause of my asthma"   Stroke ([place]) 2002   residual "problem w/using the right word, left 5 lesions on my brain/MRI; long term memory loss"   Type II diabetes mellitus ([place])     Patient Active Problem List   Diagnosis Date Noted   Chronic  systolic heart failure ([name]) 08/07/2020   COVID-19 virus infection 08/03/2020   Stroke (cerebrum) ([name]) 08/03/2020   Stroke ([name]) 08/02/2020   Lumbar radiculopathy 06/01/2020   Plantar fasciitis 06/03/2017   Syncope 10/15/2016   CKD (chronic kidney disease), stage III ([place]) 10/15/2016   Type 2 diabetes mellitus with stage 3 chronic kidney disease, with long-term current use of insulin ([name]) 08/07/2015   Bilateral leg weakness 06/06/2014   Abnormality of gait 06/06/2014   Lumbosacral stenosis 06/06/2014   Chest pain 04/16/2011   Hyperlipidemia 09/13/2008   Class 1 obesity 09/13/2008   Essential hypertension 09/13/2008   CVA 09/13/2008   ASTHMA 09/13/2008   IRRITABLE BOWEL SYNDROME 09/13/2008   GALLSTONES 09/13/2008   UTI 09/13/2008   ARTHRITIS 09/13/2008   FIBROMYALGIA 09/13/2008   HEADACHE, CHRONIC 09/13/2008    Past Surgical History:  Procedure Laterality Date   CARDIAC CATHETERIZATION  2002   CARPAL TUNNEL RELEASE  2003-2010   "twice on left; once on right"   [place]  2010   EYE SURGERY Bilateral 11/2019   Lenses Implant   histerectomy  LUMBAR LAMINECTOMY/ DECOMPRESSION WITH MET-RX Right 06/01/2020   Procedure: Right Lumbar Four-Five Minimally invasive discectomy;  Surgeon: Judith Part, MD;  Location: [facility];  Service: Neurosurgery;  Laterality: Right;   RIGHT/LEFT HEART CATH AND CORONARY ANGIOGRAPHY N/A 08/07/2020   Procedure: RIGHT/LEFT HEART CATH AND CORONARY ANGIOGRAPHY;  Surgeon: Belva Crome, MD;  Location: [facility];  Service: Cardiovascular;  Laterality: N/A;   VAGINAL HYSTERECTOMY  1977     OB History   No obstetric history on file.     Family History  Problem Relation Age of Onset   Coronary artery disease Father    Diabetes Mellitus I Father    CVA Mother    Stroke Mother    Diabetes Mellitus I Mother    Diabetes Mellitus I Sister    Diabetes Mellitus I Brother    Diabetes Mellitus I  Maternal Grandmother    Diabetes Mellitus I Maternal Grandfather    Diabetes Mellitus I Paternal Grandmother    Diabetes Mellitus I Paternal Grandfather    Diabetes Mellitus I Brother    Aortic aneurysm Son     Social History   Tobacco Use   Smoking status: Former    Packs/day: 0.75    Years: 6.00    Pack years: 4.50    Types: Cigarettes    Quit date: 01/21/1980    Years since quitting: 40.9   Smokeless tobacco: Never  Substance Use Topics   Alcohol use: No    Alcohol/week: 0.0 standard drinks   Drug use: No    Home Medications Prior to Admission medications   Medication Sig Start Date End Date Taking? Authorizing Provider  HYDROcodone-acetaminophen (NORCO/VICODIN) 5-325 MG tablet Take 1 tablet by mouth every 6 (six) hours as needed for up to 10 doses for moderate pain or severe pain. 12/13/20  Yes Kenasia Scheller, Burnadette Peter, MD  acetaminophen (TYLENOL) 325 MG tablet Take 2 tablets (650 mg total) by mouth every 6 (six) hours as needed for mild pain (or temp > 37.5 C (99.5 F)). 08/08/20   Elgergawy, Silver Huguenin, MD  apixaban (ELIQUIS) 5 MG TABS tablet Take 1 tablet (5 mg total) by mouth 2 (two) times daily. 11/14/20   Loel Dubonnet, NP  atorvastatin (LIPITOR) 80 MG tablet Take 1 tablet (80 mg total) by mouth daily. 08/09/20   Elgergawy, Silver Huguenin, MD  Blood Glucose Monitoring Suppl (ONETOUCH VERIO) w/Device KIT 1 each by Does not apply route daily. 11/14/15   Philemon Kingdom, MD  carvedilol (COREG) 3.125 MG tablet Take 0.5 tablets (1.56 mg total) by mouth 2 (two) times daily with a meal. 1/2 tab twice daily 09/07/20   Loel Dubonnet, NP  Cholecalciferol (VITAMIN D3) 125 MCG (5000 UT) TABS Take 1 tablet (5,000 Units total) by mouth once a week. 08/08/20   Elgergawy, Silver Huguenin, MD  diazepam (VALIUM) 5 MG tablet Take one tablet 30 minutes prior to cardiac MRI. Take second tablet if needed. 09/12/20   Loel Dubonnet, NP  furosemide (LASIX) 40 MG tablet Take 0.5 tablets (20 mg total) by mouth  daily. 08/08/20 09/18/20  Elgergawy, Silver Huguenin, MD  glucose blood (ONETOUCH VERIO) test strip USE 1 STRIP TO CHECK GLUCOSE 4x TIMES DAILY 05/22/20   Philemon Kingdom, MD  insulin glargine (LANTUS) 100 UNIT/ML injection Inject 0.1 mLs (10 Units total) into the skin daily. 08/08/20   Elgergawy, Silver Huguenin, MD  Insulin Syringe-Needle U-100 (RELION INSULIN SYR 0.5ML/31G) 31G X 5/16" 0.5 ML MISC USE 1  THREE  TIMES DAILY 11/28/20   Philemon Kingdom, MD  pantoprazole (PROTONIX) 40 MG tablet Take 1 tablet (40 mg total) by mouth daily. 08/09/20   Elgergawy, Silver Huguenin, MD  potassium chloride SA (KLOR-CON) 20 MEQ tablet Take 20 meq daily for the next 4 days only 11/16/20   Loel Dubonnet, NP  sertraline (ZOLOFT) 25 MG tablet Take 12.5-25 mg by mouth daily. 11/04/20   [provider]  Syringe/Needle, Disp, (SYRINGE LUER LOCK) 23G X 1" 3 ML MISC 1 Package by Does not apply route as needed. 07/21/14   Narda Amber K, DO  vitamin B-12 (CYANOCOBALAMIN) 1000 MCG tablet Take 1,000 mcg by mouth daily.    [provider]    Allergies    Codeine, Erythromycin, Lisinopril, Penicillins, Shellfish allergy, Sulfonamide derivatives, Metoprolol, Actos [pioglitazone], Amlodipine, Benicar [olmesartan], Byetta 10 mcg pen [exenatide], Gabapentin, Glimepiride, Glipizide, Glyburide-metformin, Humalog [insulin lispro], Invokana [canagliflozin], Januvia [sitagliptin], Other, Reglan [metoclopramide], Septra [sulfamethoxazole-trimethoprim], Spironolactone, Tramadol, Victoza [liraglutide], Zocor [simvastatin], Losartan potassium, and Novolin r [insulin]  Review of Systems   Review of Systems  Constitutional:  Negative for chills and fever.  HENT:  Negative for ear pain and sore throat.   Eyes:  Negative for pain and visual disturbance.  Respiratory:  Negative for cough and shortness of breath.   Cardiovascular:  Negative for chest pain and palpitations.  Gastrointestinal:  Negative for abdominal pain and vomiting.   Genitourinary:  Negative for dysuria and hematuria.  Musculoskeletal:  Negative for arthralgias and back pain.       L shoulder pain  Skin:  Negative for color change and rash.  Neurological:  Negative for seizures and syncope.  All other systems reviewed and are negative.  Physical Exam Updated Vital Signs BP (!) 148/49   Pulse (!) 56   Temp 98 F (36.7 C) (Oral)   Resp 14   Ht 5' 3.5" (1.613 m)   Wt 70.8 kg   SpO2 100%   BMI 27.20 kg/m   Physical Exam Vitals and nursing note reviewed.  Constitutional:      General: She is not in acute distress.    Appearance: Normal appearance. She is well-developed.  HENT:     Head: Normocephalic and atraumatic.     Right Ear: External ear normal.     Left Ear: External ear normal.     Nose: Nose normal. No congestion or rhinorrhea.     Mouth/Throat:     Mouth: Mucous membranes are moist.  Eyes:     Extraocular Movements: Extraocular movements intact.     Conjunctiva/sclera: Conjunctivae normal.     Pupils: Pupils are equal, round, and reactive to light.  Cardiovascular:     Rate and Rhythm: Normal rate and regular rhythm.     Pulses: Normal pulses.     Heart sounds: No murmur heard. Pulmonary:     Effort: Pulmonary effort is normal. No respiratory distress.     Breath sounds: Normal breath sounds. No wheezing, rhonchi or rales.  Abdominal:     General: Abdomen is flat. Bowel sounds are normal.     Palpations: Abdomen is soft.     Tenderness: There is no abdominal tenderness. There is no guarding or rebound.  Musculoskeletal:        General: Swelling, tenderness, deformity (L shoulder) and signs of injury present.     Cervical back: Normal range of motion and neck supple. No rigidity.     Comments: No overlying wounds or lacerations. Neurovascularly intact distally.  Skin:    General: Skin is warm and dry.     Capillary Refill: Capillary refill takes less than 2 seconds.  Neurological:     General: No focal deficit  present.     Mental Status: She is alert and oriented to person, place, and time.  Psychiatric:        Mood and Affect: Mood normal.    ED Results / Procedures / Treatments   Labs (all labs ordered are listed, but only abnormal results are displayed) Labs Reviewed - No data to display  EKG EKG Interpretation  Date/Time:  Thursday December 13 2020 17:52:05 EST Ventricular Rate:  56 PR Interval:    QRS Duration: 129 QT Interval:  529 QTC Calculation: 511 R Axis:   -42 Text Interpretation: Sinus rhythm Left bundle branch block No significant change since last tracing Confirmed by Blanchie Dessert [phone]) on 12/13/2020 6:14:42 PM  Radiology DG Chest 1 View  Result Date: 12/13/2020 CLINICAL DATA:  Pain after fall EXAM: CHEST  1 VIEW COMPARISON:  August 03, 2020 FINDINGS: The heart size and mediastinal contours are within normal limits. Both lungs are clear. The visualized skeletal structures are unremarkable. IMPRESSION: No active disease. Electronically Signed   By: Dorise Bullion III M.D.   On: 12/13/2020 18:56   DG Pelvis 1-2 Views  Result Date: 12/13/2020 CLINICAL DATA:  Pain after fall. EXAM: PELVIS - 1-2 VIEW COMPARISON:  None. FINDINGS: There is no evidence of pelvic fracture or diastasis. No pelvic bone lesions are seen. IMPRESSION: Negative. Electronically Signed   By: Dorise Bullion III M.D.   On: 12/13/2020 18:56   CT Head Wo Contrast  Result Date: 12/13/2020 CLINICAL DATA:  [place], anticoagulated EXAM: CT HEAD WITHOUT CONTRAST TECHNIQUE: Contiguous axial images were obtained from the base of the skull through the vertex without intravenous contrast. COMPARISON:  08/02/2020 FINDINGS: Brain: Cephalo malacia within the right occipital territory consistent with chronic infarct. Stable chronic small vessel ischemic changes within the left frontal periventricular white matter. No signs of acute infarct or hemorrhage. The lateral ventricles and midline structures are grossly  unremarkable. No acute extra-axial fluid collections. No mass effect. Vascular: No hyperdense vessel or unexpected calcification. Skull: Normal. Negative for fracture or focal lesion. Sinuses/Orbits: No acute finding. Other: None. IMPRESSION: 1. No acute intracranial process. 2. Chronic ischemic changes within the left frontal white matter and right occipital lobe. Electronically Signed   By: Randa Ngo M.D.   On: 12/13/2020 18:45   CT Cervical Spine Wo Contrast  Result Date: 12/13/2020 CLINICAL DATA:  [place], anticoagulated EXAM: CT CERVICAL SPINE WITHOUT CONTRAST TECHNIQUE: Multidetector CT imaging of the cervical spine was performed without intravenous contrast. Multiplanar CT image reconstructions were also generated. COMPARISON:  None. FINDINGS: Alignment: Alignment is anatomic. Skull base and vertebrae: No acute fracture. No primary bone lesion or focal pathologic process. Soft tissues and spinal canal: No prevertebral fluid or swelling. No visible canal hematoma. Disc levels: Mild C6-7 spondylosis. Moderate diffuse facet hypertrophy greatest from C3 through C6. Upper chest: Airway is patent.  Lung apices are clear. Other: Reconstructed images demonstrate no additional findings. IMPRESSION: 1. No acute cervical spine fracture. 2. Multilevel spondylosis and facet hypertrophy. Electronically Signed   By: Randa Ngo M.D.   On: 12/13/2020 18:47   DG Shoulder Left  Result Date: 12/13/2020 CLINICAL DATA:  Pain after fall.  Deformity. EXAM: LEFT SHOULDER - 2+ VIEW COMPARISON:  None. FINDINGS: There is a comminuted fracture of the left humeral neck and  head without dislocation of the humeral head. IMPRESSION: Comminuted fracture of the left humeral head neck without dislocation. Electronically Signed   By: Dorise Bullion III M.D.   On: 12/13/2020 18:55    Procedures Procedures   Medications Ordered in ED Medications  fentaNYL (SUBLIMAZE) injection 75 mcg (75 mcg Intravenous Given 12/13/20 1856)     ED Course  I have reviewed the triage vital signs and the nursing notes.  Pertinent labs & imaging results that were available during my care of the patient were reviewed by me and considered in my medical decision making (see chart for details).    MDM Rules/Calculators/A&P                         75 y/o female presents w/ L shoulder deformity s/p mechanical fall. Exam as above.  She is on anticoagulation.  She has no external signs of head injury.  She has no midline C-spine tenderness.  CT head and C spine obtained. No acute fractures, ICH, traumatic malalignment.  X-rays obtained of the left shoulder.  Concerns for closed nondisplaced humeral neck fracture.  She is neurovascularly intact.  No indications for an emergent orthopedics consult at this time.  Appropriate for discharge home in left shoulder sling and close follow-up with orthopedics in clinic.  Patient provided a short prescription for pain medicine.  She remained hemodynamically stable throughout her ED course.  She was discharged home in stable condition.  Final Clinical Impression(s) / ED Diagnoses Final diagnoses:  Fall, initial encounter  Fx humeral neck, left, closed, initial encounter    Rx / DC Orders ED Discharge Orders          Ordered    HYDROcodone-acetaminophen (NORCO/VICODIN) 5-325 MG tablet  Every 6 hours PRN        12/13/20 1919             Idamae Lusher, MD 12/13/20 1949    Blanchie Dessert, MD 12/13/20 2245

## 2020-12-13 NOTE — ED Triage Notes (Addendum)
Pt BIB [facility] from home with injuries from a mechanical fall. EMS report deformity to the L shoulder, neck pain, and L hip pain. No head injury or LOC reported. Pt is on blood thinners. EMS admin 100 mcg of fentanyl.

## 2020-12-13 NOTE — ED Notes (Signed)
Pt teaching provided on medications that may cause drowsiness. Pt instructed not to drive or operate heavy machinery while taking the prescribed medication. Pt verbalized understanding.  ? ?Pt provided discharge instructions and prescription information. Pt was given the opportunity to ask questions and questions were answered. Discharge signature not obtained in the setting of the COVID-19 pandemic in order to reduce high touch surfaces.  ? ?

## 2020-12-13 NOTE — Discharge Instructions (Addendum)
1) Please follow up with orthopedic surgery in clinic next week. The hospital will call to schedule this.

## 2020-12-19 ENCOUNTER — Ambulatory Visit: Payer: Medicare Other | Admitting: Adult Health

## 2020-12-24 ENCOUNTER — Ambulatory Visit: Payer: Self-pay

## 2020-12-24 ENCOUNTER — Ambulatory Visit: Payer: Medicare Other | Admitting: Physician Assistant

## 2020-12-24 ENCOUNTER — Encounter: Payer: Self-pay | Admitting: Physician Assistant

## 2020-12-24 DIAGNOSIS — S4292XA Fracture of left shoulder girdle, part unspecified, initial encounter for closed fracture: Secondary | ICD-10-CM

## 2020-12-24 NOTE — Progress Notes (Signed)
Office Visit Note   Patient: Yolanda Saunders           Date of Birth: [birth year]           MRN: [id number] Visit Date: 12/24/2020              Requested by: Yolanda Frees, MD [address] [place],  [place] [address] PCP: Yolanda Frees, MD   Assessment & Plan: Visit Diagnoses:  1. Shoulder fracture, left, closed, initial encounter     Plan: Given patient's multiple comorbidities recommend conservative treatment of her left proximal humerus fracture.  She can come out of the sling for gentle range of motion elbow forearm wrist and hand.  She can also come out of the sling for bathing.  She will follow-up with [place] in 1 month for 2 views of the left shoulder.  Discussed with her the fact that she most likely will require physical therapy in the future for the shoulder.  Also discussed with her the possibility of nonunion of the left shoulder.  She is agreeable with conservative treatment.  Follow-Up Instructions: Return in about 4 weeks (around 01/21/2021) for Radiographs.   Orders:  Orders Placed This Encounter  Procedures   XR Shoulder Left   No orders of the defined types were placed in this encounter.     Procedures: No procedures performed   Clinical Data: No additional findings.   Subjective: Chief Complaint  Patient presents with   Left Shoulder - Fracture, Follow-up    HPI Yolanda Saunders 74 year old female comes in today with a left comminuted fracture involving the humeral head and neck.  She was seen in the ER on 12/13/2020 where radiographs were obtained showing the fracture.  She had a fall earlier that day and was found outside by her husband.  He feels that she may have been sweeping leaves with a broom.  She is unsure of what exactly happened.  She does have some problems with memory and secondary stroke.  She is on chronic anticoagulation.  She also has type 2 diabetes with stage III chronic kidney disease and systolic heart failure. She  presents today in sling and swath on the left upper extremity. Review of Systems See HPI otherwise negative  Objective: Vital Signs: There were no vitals taken for this visit.  Physical Exam General well-developed well-nourished female no acute distress. Ortho Exam Left hand sensation intact.  Full motor.  She is in a sling and swath.  No attempts of range of motion shoulder today. Specialty Comments:  No specialty comments available.  Imaging: XR Shoulder Left  Result Date: 12/24/2020 Left shoulder 2 views: Showed no significant change in overall position alignment of the comminuted head and neck of the humerus.  Shoulder is well located.    PMFS History: Patient Active Problem List   Diagnosis Date Noted   Chronic systolic heart failure ([place]) 08/07/2020   COVID-19 virus infection 08/03/2020   Stroke (cerebrum) ([place]) 08/03/2020   Stroke ([place]) 08/02/2020   Lumbar radiculopathy 06/01/2020   Plantar fasciitis 06/03/2017   Syncope 10/15/2016   CKD (chronic kidney disease), stage III ([place]) 10/15/2016   Type 2 diabetes mellitus with stage 3 chronic kidney disease, with long-term current use of insulin ([place]) 08/07/2015   Bilateral leg weakness 06/06/2014   Abnormality of gait 06/06/2014   Lumbosacral stenosis 06/06/2014   Chest pain 04/16/2011   Hyperlipidemia 09/13/2008   Class 1 obesity 09/13/2008   Essential hypertension 09/13/2008  CVA 09/13/2008   ASTHMA 09/13/2008   IRRITABLE BOWEL SYNDROME 09/13/2008   GALLSTONES 09/13/2008   UTI 09/13/2008   ARTHRITIS 09/13/2008   FIBROMYALGIA 09/13/2008   HEADACHE, CHRONIC 09/13/2008   Past Medical History:  Diagnosis Date   Anemia 04/17/11   "long, long years ago"   Angina 04/16/11   "that's what I'm here for"   Anxiety    Arthritis    Hips and knees   Asthma    Carpal tunnel syndrome    Cataracts, bilateral    CKD (chronic kidney disease), stage III ([place])    Complication of anesthesia 1987   "affected my eyes;  couldn't see anything but blurrs even the next day"   CVA (cerebral infarction)    After cardiac catheter 02/2000   Depression    Edema    Fibromyalgia    GERD (gastroesophageal reflux disease)    Headache(784.0)    High cholesterol    History of bronchitis    Hypertension    IBS (irritable bowel syndrome)    Migraines    Panic attacks 04/17/11   "don't take anything for it"   Pneumonia 04/17/11   "probably as many as 7 times"   Renal disorder 04/17/11   "they are working at 60% capacity"   Shortness of breath on exertion    "cause of my asthma"   Stroke ([place]) 2002   residual "problem w/using the right word, left 5 lesions on my brain/MRI; long term memory loss"   Type II diabetes mellitus ([place])     Family History  Problem Relation Age of Onset   Coronary artery disease Father    Diabetes Mellitus I Father    CVA Mother    Stroke Mother    Diabetes Mellitus I Mother    Diabetes Mellitus I Sister    Diabetes Mellitus I Brother    Diabetes Mellitus I Maternal Grandmother    Diabetes Mellitus I Maternal Grandfather    Diabetes Mellitus I Paternal Grandmother    Diabetes Mellitus I Paternal Grandfather    Diabetes Mellitus I Brother    Aortic aneurysm Son     Past Surgical History:  Procedure Laterality Date   CARDIAC CATHETERIZATION  2002   CARPAL TUNNEL RELEASE  2003-2010   "twice on left; once on right"   [place]  2010   EYE SURGERY Bilateral 11/2019   Lenses Implant   histerectomy     LUMBAR LAMINECTOMY/ DECOMPRESSION WITH MET-RX Right 06/01/2020   Procedure: Right Lumbar Four-Five Minimally invasive discectomy;  Surgeon: Yolanda Part, MD;  Location: [facility];  Service: Neurosurgery;  Laterality: Right;   RIGHT/LEFT HEART CATH AND CORONARY ANGIOGRAPHY N/A 08/07/2020   Procedure: RIGHT/LEFT HEART CATH AND CORONARY ANGIOGRAPHY;  Surgeon: Yolanda Crome, MD;  Location: [facility];  Service: Cardiovascular;  Laterality:  N/A;   VAGINAL HYSTERECTOMY  1977   Social History   Occupational History   Not on file  Tobacco Use   Smoking status: Former    Packs/day: 0.75    Years: 6.00    Pack years: 4.50    Types: Cigarettes    Quit date: 01/21/1980    Years since quitting: 40.9   Smokeless tobacco: Never  Vaping Use   Vaping Use: Not on file  Substance and Sexual Activity   Alcohol use: No    Alcohol/week: 0.0 standard drinks   Drug use: No   Sexual activity: Not Currently

## 2021-01-04 DIAGNOSIS — E1121 Type 2 diabetes mellitus with diabetic nephropathy: Secondary | ICD-10-CM | POA: Diagnosis not present

## 2021-01-04 DIAGNOSIS — K219 Gastro-esophageal reflux disease without esophagitis: Secondary | ICD-10-CM | POA: Diagnosis not present

## 2021-01-04 DIAGNOSIS — M797 Fibromyalgia: Secondary | ICD-10-CM | POA: Diagnosis not present

## 2021-01-04 DIAGNOSIS — R600 Localized edema: Secondary | ICD-10-CM | POA: Diagnosis not present

## 2021-01-04 DIAGNOSIS — I679 Cerebrovascular disease, unspecified: Secondary | ICD-10-CM | POA: Diagnosis not present

## 2021-01-04 DIAGNOSIS — I480 Paroxysmal atrial fibrillation: Secondary | ICD-10-CM | POA: Diagnosis not present

## 2021-01-04 DIAGNOSIS — R300 Dysuria: Secondary | ICD-10-CM | POA: Diagnosis not present

## 2021-01-04 DIAGNOSIS — N183 Chronic kidney disease, stage 3 unspecified: Secondary | ICD-10-CM | POA: Diagnosis not present

## 2021-01-04 DIAGNOSIS — S4292XD Fracture of left shoulder girdle, part unspecified, subsequent encounter for fracture with routine healing: Secondary | ICD-10-CM | POA: Diagnosis not present

## 2021-01-04 DIAGNOSIS — E7800 Pure hypercholesterolemia, unspecified: Secondary | ICD-10-CM | POA: Diagnosis not present

## 2021-01-04 DIAGNOSIS — I10 Essential (primary) hypertension: Secondary | ICD-10-CM | POA: Diagnosis not present

## 2021-01-21 ENCOUNTER — Emergency Department (HOSPITAL_COMMUNITY)
Admission: EM | Admit: 2021-01-21 | Discharge: 2021-01-21 | Disposition: A | Discharge status: Home / Self Care | Payer: Medicare Other | Attending: Emergency Medicine | Admitting: Emergency Medicine

## 2021-01-21 ENCOUNTER — Encounter (HOSPITAL_COMMUNITY): Payer: Self-pay | Admitting: Emergency Medicine

## 2021-01-21 ENCOUNTER — Other Ambulatory Visit: Payer: Self-pay

## 2021-01-21 DIAGNOSIS — Z79899 Other long term (current) drug therapy: Secondary | ICD-10-CM | POA: Insufficient documentation

## 2021-01-21 DIAGNOSIS — I2510 Atherosclerotic heart disease of native coronary artery without angina pectoris: Secondary | ICD-10-CM | POA: Diagnosis not present

## 2021-01-21 DIAGNOSIS — Z794 Long term (current) use of insulin: Secondary | ICD-10-CM | POA: Diagnosis not present

## 2021-01-21 DIAGNOSIS — I10 Essential (primary) hypertension: Secondary | ICD-10-CM | POA: Insufficient documentation

## 2021-01-21 DIAGNOSIS — R002 Palpitations: Secondary | ICD-10-CM | POA: Diagnosis not present

## 2021-01-21 DIAGNOSIS — Z7901 Long term (current) use of anticoagulants: Secondary | ICD-10-CM | POA: Diagnosis not present

## 2021-01-21 DIAGNOSIS — R079 Chest pain, unspecified: Secondary | ICD-10-CM | POA: Insufficient documentation

## 2021-01-21 LAB — BASIC METABOLIC PANEL
Anion gap: 11 (ref 5–15)
BUN: 17 mg/dL (ref 8–23)
CO2: 27 mmol/L (ref 22–32)
Calcium: 9.8 mg/dL (ref 8.9–10.3)
Chloride: 100 mmol/L (ref 98–111)
Creatinine, Ser: 1.25 mg/dL — ABNORMAL HIGH (ref 0.44–1.00)
GFR, Estimated: 44 mL/min — ABNORMAL LOW (ref >60)
Glucose, Bld: 101 mg/dL — ABNORMAL HIGH (ref 70–99)
Potassium: 3.7 mmol/L (ref 3.5–5.1)
Sodium: 138 mmol/L (ref 135–145)

## 2021-01-21 LAB — CBC
HCT: 39.7 % (ref 36.0–46.0)
Hemoglobin: 12.9 g/dL (ref 12.0–15.0)
MCH: 31.6 pg (ref 26.0–34.0)
MCHC: 32.5 g/dL (ref 30.0–36.0)
MCV: 97.3 fL (ref 80.0–100.0)
Platelets: 165 10*3/uL (ref 150–400)
RBC: 4.08 MIL/uL (ref 3.87–5.11)
RDW: 14 % (ref 11.5–15.5)
WBC: 6.9 10*3/uL (ref 4.0–10.5)
nRBC: 0 % (ref 0.0–0.2)

## 2021-01-21 LAB — MAGNESIUM: Magnesium: 1.9 mg/dL (ref 1.7–2.4)

## 2021-01-21 LAB — CBG MONITORING, ED: Glucose-Capillary: 105 mg/dL — ABNORMAL HIGH (ref 70–99)

## 2021-01-21 LAB — TROPONIN I (HIGH SENSITIVITY)
Troponin I (High Sensitivity): 9 ng/L (ref <18)
Troponin I (High Sensitivity): 8 ng/L (ref <18)

## 2021-01-21 NOTE — ED Provider Notes (Signed)
Emergency Medicine Provider Triage Evaluation Note  Yolanda Saunders , a 76 y.o. female  was evaluated in triage.  Pt complains of heart palpitations and weakness that has been going on for 3 days.  She reports that this comes and goes.  She feels weak when this happens and is afraid to stand up in case she gets dizzy.  History of ACS, however reports that when she had her heart attack she syncopized and that was her only symptom.  Review of Systems  Positive: Chest palpitations Negative: Shortness of breath  Physical Exam  BP (!) 182/73 (BP Location: Right Arm)    Pulse 71    Temp (!) 97.4 F (36.3 C) (Oral)    Resp 16    SpO2 99%  Gen:   Awake, no distress   Resp:  Normal effort  MSK:   Moves extremities without difficulty  Other:  Regular rate and rhythm, no murmurs or rubs.  Lungs clear.  Medical Decision Making  Medically screening exam initiated at 9:07 AM.  Appropriate orders placed.  Yolanda Saunders was informed that the remainder of the evaluation will be completed by another provider, this initial triage assessment does not replace that evaluation, and the importance of remaining in the ED until their evaluation is complete.     Saddie Benders, PA-C 01/21/21 0913    Gloris Manchester, MD 01/23/21 [phone]

## 2021-01-21 NOTE — ED Triage Notes (Signed)
Patient coming from home, complaint of heart palpitations and chest discomfort for 3 days. Pt endorses this comes and goes.

## 2021-01-21 NOTE — ED Provider Notes (Signed)
[facility] EMERGENCY DEPARTMENT Provider Note   CSN: [id number] Arrival date & time: 01/21/21  0846     History  Chief Complaint  Patient presents with   Chest Pain   Palpitations    Yolanda Saunders is a 76 y.o. female.  HPI  76 year old female with past medical history of paroxysmal A. fib on Eliquis, CAD, HTN, HLD, anemia, previous CVA with residual speech changes presents the emergency department with concern for palpitations.  History obtained from the husband at the patient's request due to difficulty with conversation.  Of note she recently had a fall around Thanksgiving and sustained a broken left upper extremity that is in a sling.  He reveals that for the past couple days the patient has been having intermittent palpitations with chest tightness.  This self resolves.  The patient remained seated during these episodes.  Denies any lightheadedness while she is seated.  At this time has no active chest pain, back pain, flank pain or shortness of breath.  No new swelling of her lower extremities.  Denies any recent fever, cough or infection.  Home Medications Prior to Admission medications   Medication Sig Start Date End Date Taking? Authorizing Provider  acetaminophen (TYLENOL) 325 MG tablet Take 2 tablets (650 mg total) by mouth every 6 (six) hours as needed for mild pain (or temp > 37.5 C (99.5 F)). 08/08/20  Yes Elgergawy, Silver Huguenin, MD  apixaban (ELIQUIS) 5 MG TABS tablet Take 1 tablet (5 mg total) by mouth 2 (two) times daily. 11/14/20  Yes Loel Dubonnet, NP  atorvastatin (LIPITOR) 80 MG tablet Take 1 tablet (80 mg total) by mouth daily. 08/09/20  Yes Elgergawy, Silver Huguenin, MD  Blood Glucose Monitoring Suppl (ONETOUCH VERIO) w/Device KIT 1 each by Does not apply route daily. 11/14/15  Yes Philemon Kingdom, MD  carvedilol (COREG) 3.125 MG tablet Take 0.5 tablets (1.56 mg total) by mouth 2 (two) times daily with a meal. 1/2 tab twice daily 09/07/20  Yes  Loel Dubonnet, NP  Cholecalciferol (VITAMIN D3) 125 MCG (5000 UT) TABS Take 1 tablet (5,000 Units total) by mouth once a week. 08/08/20  Yes Elgergawy, Silver Huguenin, MD  furosemide (LASIX) 40 MG tablet Take 0.5 tablets (20 mg total) by mouth daily. 08/08/20 01/21/21 Yes Elgergawy, Silver Huguenin, MD  glucose blood (ONETOUCH VERIO) test strip USE 1 STRIP TO CHECK GLUCOSE 4x TIMES DAILY Patient taking differently: 1 each by Other route See admin instructions. USE 1 STRIP TO CHECK GLUCOSE 4x TIMES DAILY 05/22/20  Yes Philemon Kingdom, MD  HYDROcodone-acetaminophen (NORCO/VICODIN) 5-325 MG tablet Take 1 tablet by mouth every 6 (six) hours as needed for up to 10 doses for moderate pain or severe pain. 12/13/20  Yes MacPherson, Burnadette Peter, MD  insulin glargine (LANTUS) 100 UNIT/ML injection Inject 0.1 mLs (10 Units total) into the skin daily. 08/08/20  Yes Elgergawy, Silver Huguenin, MD  Insulin Syringe-Needle U-100 (RELION INSULIN SYR 0.5ML/31G) 31G X 5/16" 0.5 ML MISC USE 1  THREE TIMES DAILY 11/28/20  Yes Philemon Kingdom, MD  irbesartan (AVAPRO) 75 MG tablet Take 37.5 mg by mouth daily. 12/26/20  Yes [provider]  pantoprazole (PROTONIX) 40 MG tablet Take 1 tablet (40 mg total) by mouth daily. 08/09/20  Yes Elgergawy, Silver Huguenin, MD  sertraline (ZOLOFT) 25 MG tablet Take 12.5-25 mg by mouth daily. 11/04/20  Yes [provider]  Syringe/Needle, Disp, (SYRINGE LUER LOCK) 23G X 1" 3 ML MISC 1 Package by Does not  apply route as needed. Patient taking differently: 1 Package by Does not apply route daily. 07/21/14  Yes Patel, Donika K, DO  vitamin B-12 (CYANOCOBALAMIN) 1000 MCG tablet Take 1,000 mcg by mouth daily.   Yes [provider]  diazepam (VALIUM) 5 MG tablet Take one tablet 30 minutes prior to cardiac MRI. Take second tablet if needed. Patient not taking: Reported on 01/21/2021 09/12/20   Loel Dubonnet, NP  nitrofurantoin, macrocrystal-monohydrate, (MACROBID) 100 MG capsule Take 100 mg by mouth  every 12 (twelve) hours. 01/04/21   [provider]  potassium chloride SA (KLOR-CON) 20 MEQ tablet Take 20 meq daily for the next 4 days only Patient not taking: Reported on 01/21/2021 11/16/20   Loel Dubonnet, NP      Allergies    Codeine, Erythromycin, Lisinopril, Penicillins, Shellfish allergy, Sulfonamide derivatives, Metoprolol, Actos [pioglitazone], Amlodipine, Benicar [olmesartan], Byetta 10 mcg pen [exenatide], Clonidine hcl, Cyclobenzaprine, Gabapentin, Glimepiride, Glipizide, Glyburide-metformin, Humalog [insulin lispro], Hydralazine hcl, Invokana [canagliflozin], Januvia [sitagliptin], Other, Prednisone, Reglan [metoclopramide], Septra [sulfamethoxazole-trimethoprim], Spironolactone, Tramadol, Victoza [liraglutide], Zocor [simvastatin], Losartan potassium, and Novolin r [insulin]    Review of Systems   Review of Systems  Constitutional:  Negative for chills, fatigue and fever.  HENT:  Negative for congestion.   Eyes:  Negative for visual disturbance.  Respiratory:  Positive for chest tightness. Negative for cough and shortness of breath.   Cardiovascular:  Positive for palpitations and leg swelling. Negative for chest pain.  Gastrointestinal:  Negative for abdominal pain, diarrhea and vomiting.  Genitourinary:  Negative for dysuria and flank pain.  Musculoskeletal:  Negative for back pain.  Skin:  Negative for rash.  Neurological:  Negative for dizziness, light-headedness and headaches.   Physical Exam Updated Vital Signs BP (!) 150/53    Pulse (!) 55    Temp 98.1 F (36.7 C)    Resp 18    Ht 5' 3.5" (1.613 m)    Wt 63.5 kg    SpO2 93%    BMI 24.41 kg/m  Physical Exam Vitals and nursing note reviewed.  Constitutional:      General: She is not in acute distress.    Appearance: Normal appearance. She is not diaphoretic.  HENT:     Head: Normocephalic.     Mouth/Throat:     Mouth: Mucous membranes are moist.  Cardiovascular:     Rate and Rhythm: Normal rate.   Pulmonary:     Effort: Pulmonary effort is normal. No respiratory distress.     Breath sounds: No decreased breath sounds, wheezing or rales.  Abdominal:     Palpations: Abdomen is soft.     Tenderness: There is no abdominal tenderness.  Musculoskeletal:     Right lower leg: No edema.     Left lower leg: No edema.  Skin:    General: Skin is warm.  Neurological:     Mental Status: She is alert and oriented to person, place, and time. Mental status is at baseline.  Psychiatric:        Mood and Affect: Mood normal.    ED Results / Procedures / Treatments   Labs (all labs ordered are listed, but only abnormal results are displayed) Labs Reviewed  BASIC METABOLIC PANEL - Abnormal; Notable for the following components:      Result Value   Glucose, Bld 101 (*)    Creatinine, Ser 1.25 (*)    GFR, Estimated 44 (*)    All other components within normal limits  CBC  MAGNESIUM  TROPONIN I (HIGH SENSITIVITY)  TROPONIN I (HIGH SENSITIVITY)    EKG EKG Interpretation  Date/Time:  Monday January 21 2021 09:01:15 EST Ventricular Rate:  67 PR Interval:  144 QRS Duration: 122 QT Interval:  438 QTC Calculation: 462 R Axis:   -30 Text Interpretation: Normal sinus rhythm Left axis deviation Minimal voltage criteria for LVH, may be normal variant ( Cornell product ) Septal infarct , age undetermined Abnormal ECG When compared with ECG of 13-Dec-2020 17:52, PREVIOUS ECG IS PRESENT Similar to previous Confirmed by Lavenia Atlas [phone]) on 01/21/2021 10:39:23 AM  Radiology No results found.  Procedures Procedures    Medications Ordered in ED Medications - No data to display  ED Course/ Medical Decision Making/ A&P                           Medical Decision Making   This patient presents to the ED for concern of palpitations, pain and chest, this involves an extensive number of treatment options, and is a complaint that carries with it a high risk of complications and morbidity.   The differential diagnosis includes palpitations, arrhythmia, PVCs, cardiac disease.   Additional history obtained: -Additional history obtained from husband at bedside -External records from outside source obtained and reviewed including: Chart review including previous notes, labs, imaging, consultation notes   Lab Tests: -I ordered, reviewed, and interpreted labs.  The pertinent results include: Mildly elevated but baseline creatinine, negative troponin with no significant delta, normal magnesium.  EKG that shows normal sinus rhythm with left bundle branch block morphology which is baseline.   ED Course: 76 year old female presents emergency department intermittent palpitations and chest pain.  She has history of paroxysmal atrial fibrillation, on medications and anticoagulation.  She is normal sinus rhythm on arrival, has remained in normal sinus rhythm the entirety of her patient evaluation.  Blood work is baseline and reassuring, no signs of ACS.  She has had no tachycardia or hypoxia here to suggest PE.  Orthostatic vitals were normal.  She is been well-appearing, asymptomatic.   Critical Interventions: None   Cardiac Monitoring: The patient was maintained on a cardiac monitor.  I personally viewed and interpreted the cardiac monitored which showed an underlying rhythm of: Normal sinus rhythm with PVCs   Reevaluation: After the interventions noted above, I reevaluated the patient and found that they have :resolved   Dispostion: Patient at this time appears safe and stable for discharge with close follow-up as an outpatient.  Discharge plan and strict return to ED precautions discussed, patient verbalizes understanding and agreement.        Final Clinical Impression(s) / ED Diagnoses Final diagnoses:  None    Rx / DC Orders ED Discharge Orders     None         Lorelle Gibbs, DO 01/21/21 1522

## 2021-01-21 NOTE — Discharge Instructions (Signed)
You have been seen and discharged from the emergency department.  Your blood work and heart work-up were normal.  Your heart rhythm remained normal while you were here.  Follow-up with your primary provider and cardiologist for reevaluation and further care. Take home medications as prescribed. If you have any worsening symptoms or further concerns for your health please return to an emergency department for further evaluation.

## 2021-01-23 ENCOUNTER — Ambulatory Visit: Payer: Medicare Other | Admitting: Physician Assistant

## 2021-02-12 ENCOUNTER — Ambulatory Visit: Payer: Medicare Other | Admitting: Adult Health

## 2021-02-12 ENCOUNTER — Encounter: Payer: Self-pay | Admitting: Adult Health

## 2021-02-12 VITALS — BP 118/62 | HR 59 | Ht 63.0 in | Wt 146.0 lb

## 2021-02-12 DIAGNOSIS — I63431 Cerebral infarction due to embolism of right posterior cerebral artery: Secondary | ICD-10-CM

## 2021-02-12 DIAGNOSIS — H547 Unspecified visual loss: Secondary | ICD-10-CM

## 2021-02-12 DIAGNOSIS — I69398 Other sequelae of cerebral infarction: Secondary | ICD-10-CM

## 2021-02-12 DIAGNOSIS — I69319 Unspecified symptoms and signs involving cognitive functions following cerebral infarction: Secondary | ICD-10-CM

## 2021-02-12 DIAGNOSIS — I480 Paroxysmal atrial fibrillation: Secondary | ICD-10-CM | POA: Diagnosis not present

## 2021-02-12 NOTE — Progress Notes (Signed)
[facility] [address] [place]. [place] [address] [phone]       STROKE FOLLOW UP NOTE  Yolanda Saunders Date of Birth:  December 17, 1945 Medical Record Number:  [id number]   Reason for Referral: stroke follow up    SUBJECTIVE:   CHIEF COMPLAINT:  Chief Complaint  Patient presents with   Follow-up    RM 3 with niece's Cristy and kayla Pt is well, still having some speech complications, vision impairment and some confusion per niece.    HPI:   Update 02/12/2021 JM: patient being seen by 5 month stroke follow accompanied by family members. Continued visual loss and cognitive impairment. Per niece, no improvement but denies worsening. Did not work with [facility] after prior visit (placed order for [facility] OT/SLP). Greater difficulty with short-term memory and remembering numbers.  Routinely followed by ophthalmology.  Cardiac monitor showed evidence of atrial fibrillation and some started on Eliquis which she has remained on without difficulty.  Remains on atorvastatin without side effects.  Blood pressure today 118/62. She did suffer a fall in November  in fracture of left humeral head - has f/u with ortho tomorrow. No additional falls. Does have chronic gait impairment which as been stable.  No further concerns at this time.    History provided for reference purposes only  Initial visit 09/18/2020, Yolanda Saunders is being seen for hospital follow-up accompanied by her husband, Shanon Brow.  Reports residual visual impairment, cognitive difficulties and high anxiety.  She has not participated in any type of therapy since discharge.  Provider at PCP office started anxiety medicine last week (husband unsure of name) but she feels like it makes anxiety worse - she has f/u with PCP Dr. Kenton Kingfisher next week. Of note, son passed away the week of admission to the hospital which husband believes may be contributing.  Denies any speech or language but has experienced difficulty with  comprehension such as reading and short term memory impairment.  Ambulation limited due to back pain - s/p lumbar microdiscectomy 06/01/2020 with plans on additional surgical procedure TBD.  Continues on aspirin and Plavix as well as atorvastatin without side effects. Blood pressure today 126/67. Monitors at home and has been stable.  Completed 30-day cardiac event monitor last week.  Follow-up with cardiology with plans on cardiac MRI 9/8 to evaluate for possible LV thrombus.  No further concerns at this time.   Hospital follow up 02/13/2020 [name] is a 76 y.o. female with history of hypertension, hyperlipidemia, diabetes, migraine, and CKD stage III admitted on 08/02/2020 for vision changes and confusion for 2 days.  Personally reviewed hospitalization pertinent progress notes, lab work and imaging.  Evaluated by Dr. Erlinda Hong for right large PCA infarct, embolic secondary to unclear source.  CTA head/neck right P2 occlusion, left P2 stenosis and right ICA petrous portion 75% stenosis.  Recommended placement of loop recorder outpatient to evaluate for possible A. Fib due to COVID status however given low EF patient not candidate for loop recorder and recommended 30-day cardiac event monitor.  COVID-positive on admission.  New dx of cardiomyopathy with current EF 35 to 40% and prior EF 50 to 55% 2018 - cards eval requested.  A1c 7.2.  LDL 221.  Recommended DAPT for 3 months then Plavix alone as well as increase home dose atorvastatin from 40 mg to 80 mg daily.  Other stroke risk factors include advanced age, obesity and migraines.  Residual deficits of cognitive impairment and left hemianopia.  Therapies recommended HH OT/SLP/PT        ROS:   14 system review of systems performed and negative with exception of those listed in HPI  PMH:  Past Medical History:  Diagnosis Date   Anemia 04/17/11   "long, long years ago"   Angina 04/16/11   "that's what I'm here for"   Anxiety    Arthritis     Hips and knees   Asthma    Carpal tunnel syndrome    Cataracts, bilateral    CKD (chronic kidney disease), stage III ([place])    Complication of anesthesia 1987   "affected my eyes; couldn't see anything but blurrs even the next day"   CVA (cerebral infarction)    After cardiac catheter 02/2000   Depression    Edema    Fibromyalgia    GERD (gastroesophageal reflux disease)    Headache(784.0)    High cholesterol    History of bronchitis    Hypertension    IBS (irritable bowel syndrome)    Migraines    Panic attacks 04/17/11   "don't take anything for it"   Pneumonia 04/17/11   "probably as many as 7 times"   Renal disorder 04/17/11   "they are working at 60% capacity"   Shortness of breath on exertion    "cause of my asthma"   Stroke ([place]) 2002   residual "problem w/using the right word, left 5 lesions on my brain/MRI; long term memory loss"   Type II diabetes mellitus ([place])     PSH:  Past Surgical History:  Procedure Laterality Date   CARDIAC CATHETERIZATION  2002   CARPAL TUNNEL RELEASE  2003-2010   "twice on left; once on right"   [place]   EYE SURGERY Bilateral 11/2019   Lenses Implant   histerectomy     LUMBAR LAMINECTOMY/ DECOMPRESSION WITH MET-RX Right 06/01/2020   Procedure: Right Lumbar Four-Five Minimally invasive discectomy;  Surgeon: Judith Part, MD;  Location: [facility];  Service: Neurosurgery;  Laterality: Right;   RIGHT/LEFT HEART CATH AND CORONARY ANGIOGRAPHY N/A 08/07/2020   Procedure: RIGHT/LEFT HEART CATH AND CORONARY ANGIOGRAPHY;  Surgeon: Belva Crome, MD;  Location: [facility];  Service: Cardiovascular;  Laterality: N/A;   VAGINAL HYSTERECTOMY  1977    Social History:  Social History   Socioeconomic History   Marital status: Married    Spouse name: Not on file   Number of children: Not on file   Years of education: Not on file   Highest education level: Not on file  Occupational History    Not on file  Tobacco Use   Smoking status: Former    Packs/day: 0.75    Years: 6.00    Pack years: 4.50    Types: Cigarettes    Quit date: 01/21/1980    Years since quitting: 41.0   Smokeless tobacco: Never  Vaping Use   Vaping Use: Not on file  Substance and Sexual Activity   Alcohol use: No    Alcohol/week: 0.0 standard drinks   Drug use: No   Sexual activity: Not Currently  Other Topics Concern   Not on file  Social History Narrative   Lives with husband in a one story home.  Has 2 children.  Retired from [employer].  Education: 12th grade.  Trade schools.    Social Determinants of Health   Financial Resource Strain: Not on file  Food Insecurity: Not on  file  Transportation Needs: Not on file  Physical Activity: Not on file  Stress: Not on file  Social Connections: Not on file  Intimate Partner Violence: Not on file    Family History:  Family History  Problem Relation Age of Onset   Coronary artery disease Father    Diabetes Mellitus I Father    CVA Mother    Stroke Mother    Diabetes Mellitus I Mother    Diabetes Mellitus I Sister    Diabetes Mellitus I Brother    Diabetes Mellitus I Maternal Grandmother    Diabetes Mellitus I Maternal Grandfather    Diabetes Mellitus I Paternal Grandmother    Diabetes Mellitus I Paternal Grandfather    Diabetes Mellitus I Brother    Aortic aneurysm Son     Medications:   Current Outpatient Medications on File Prior to Visit  Medication Sig Dispense Refill   acetaminophen (TYLENOL) 325 MG tablet Take 2 tablets (650 mg total) by mouth every 6 (six) hours as needed for mild pain (or temp > 37.5 C (99.5 F)).     apixaban (ELIQUIS) 5 MG TABS tablet Take 1 tablet (5 mg total) by mouth 2 (two) times daily. 60 tablet 5   atorvastatin (LIPITOR) 80 MG tablet Take 1 tablet (80 mg total) by mouth daily. 30 tablet 0   Blood Glucose Monitoring Suppl (ONETOUCH VERIO) w/Device KIT 1 each by Does not apply route daily. 1 kit 0    carvedilol (COREG) 3.125 MG tablet Take 0.5 tablets (1.56 mg total) by mouth 2 (two) times daily with a meal. 1/2 tab twice daily     Cholecalciferol (VITAMIN D3) 125 MCG (5000 UT) TABS Take 1 tablet (5,000 Units total) by mouth once a week. 30 tablet    diazepam (VALIUM) 5 MG tablet Take one tablet 30 minutes prior to cardiac MRI. Take second tablet if needed. 2 tablet 0   furosemide (LASIX) 40 MG tablet Take 0.5 tablets (20 mg total) by mouth daily. 30 tablet    glucose blood (ONETOUCH VERIO) test strip USE 1 STRIP TO CHECK GLUCOSE 4x TIMES DAILY (Patient taking differently: 1 each by Other route See admin instructions. USE 1 STRIP TO CHECK GLUCOSE 4x TIMES DAILY) 200 each 11   HYDROcodone-acetaminophen (NORCO/VICODIN) 5-325 MG tablet Take 1 tablet by mouth every 6 (six) hours as needed for up to 10 doses for moderate pain or severe pain. 10 tablet 0   insulin glargine (LANTUS) 100 UNIT/ML injection Inject 0.1 mLs (10 Units total) into the skin daily. 20 mL 3   Insulin Syringe-Needle U-100 (RELION INSULIN SYR 0.5ML/31G) 31G X 5/16" 0.5 ML MISC USE 1  THREE TIMES DAILY 300 each 0   irbesartan (AVAPRO) 75 MG tablet Take 37.5 mg by mouth daily.     nitrofurantoin, macrocrystal-monohydrate, (MACROBID) 100 MG capsule Take 100 mg by mouth every 12 (twelve) hours.     pantoprazole (PROTONIX) 40 MG tablet Take 1 tablet (40 mg total) by mouth daily. 30 tablet 0   potassium chloride SA (KLOR-CON) 20 MEQ tablet Take 20 meq daily for the next 4 days only 4 tablet 0   sertraline (ZOLOFT) 25 MG tablet Take 12.5-25 mg by mouth daily.     Syringe/Needle, Disp, (SYRINGE LUER LOCK) 23G X 1" 3 ML MISC 1 Package by Does not apply route as needed. (Patient taking differently: 1 Package by Does not apply route daily.) 50 each 0   vitamin B-12 (CYANOCOBALAMIN) 1000 MCG tablet Take 1,000 mcg  by mouth daily.     No current facility-administered medications on file prior to visit.    Allergies:   Allergies  Allergen  Reactions   Codeine Other (See Comments)    "makes me crazy; see things; delusional"   Erythromycin Other (See Comments)    "peeled skin; like a sunburn & I turn the color of the pill; a kind of purple-look"   Lisinopril Cough    "cause I have asthma"   Penicillins Rash and Other (See Comments)    "puffy blisters   Shellfish Allergy     Throat swells   Sulfonamide Derivatives Hives    "watery hives"   Metoprolol Diarrhea and Nausea And Vomiting    Rapid weight gain.   Actos [Pioglitazone]     Upset GI   Amlodipine     Syncope   Benicar [Olmesartan] Other (See Comments)    Dizziness and HA   Byetta 10 Mcg Pen [Exenatide] Nausea And Vomiting    5 MCG pen   Clonidine Hcl Other (See Comments)   Cyclobenzaprine Other (See Comments)   Gabapentin Other (See Comments)    Anxiety    Glimepiride     Upset GI   Glipizide     Upset GI   Glyburide-Metformin     Myalgias   Humalog [Insulin Lispro]     Headache, SEVERE HYPERGLYCEMIA   Hydralazine Hcl Other (See Comments)   Invokana [Canagliflozin]     Yeast infections   Januvia [Sitagliptin]     uti   Other     Other reaction(s): Unknown   Prednisone Other (See Comments)   Reglan [Metoclopramide]     unknown   Septra [Sulfamethoxazole-Trimethoprim] Hives   Spironolactone     unk   Tramadol Other (See Comments)    anxiety   Victoza [Liraglutide] Diarrhea    Abdominal pain    Zocor [Simvastatin]     unknown   Losartan Potassium Rash    Upset GI   Novolin R [Insulin] Swelling and Rash    70/30      OBJECTIVE:  Physical Exam  Vitals:   02/12/21 1437  BP: 118/62  Pulse: (!) 59  Weight: 146 lb (66.2 kg)  Height: '5\' 3"'  (1.6 m)   Body mass index is 25.86 kg/m. No results found.  General: well developed, well nourished, very pleasant elderly Caucasian female, seated, in no evident distress Head: head normocephalic and atraumatic.   Neck: supple with no carotid or supraclavicular bruits Cardiovascular: regular  rate and rhythm, no murmurs Musculoskeletal: no deformity Skin:  no rash/petichiae Vascular:  Normal pulses all extremities   Neurologic Exam Mental Status: Awake and fully alert.  Occasional speech hesitancy but able to name objects without difficulty.  No evidence of dysarthria.  Oriented to city and month, unable to state age, able to state current year with options.  Recent memory impaired and remote memory intact. Attention span, concentration and fund of knowledge impaired but improved since prior visit. Mood and affect appropriate.  Cranial Nerves: Pupils equal, briskly reactive to light. Extraocular movements full without nystagmus. Visual fields dense left homonymous hemianopia. Hearing intact. Facial sensation intact. Face, tongue, palate moves normally and symmetrically.  Motor: Normal bulk and tone. Normal strength in all tested extremity muscles Sensory.: intact to touch , pinprick , position and vibratory sensation.  Coordination: Rapid alternating movements normal in all extremities. Finger-to-nose and heel-to-shin performed accurately bilaterally. Gait and Station: stands from seated position with mild difficulty. Ambulates with assistance  of her niece - mild unsteadiness - baseline per niece.  Tandem walk and heel toe not attempted Reflexes: 1+ and symmetric. Toes downgoing.         ASSESSMENT: Yolanda Saunders is a 76 y.o. year old female with recent right large PCA infarct, embolic secondary to unclear source on 08/02/2020 after presenting with visual changes and confusion for 2 days. Vascular risk factors include HTN, HLD, DM, cardiomyopathy, migraines, CAD and COVID-positive during admission. Cardiac monitor showed atrial fibrillation     PLAN:  Right PCA stroke:  Residual deficit: Cognitive impairment and left homonymous hemianopia. Will discuss outpatient SLP with husband and call if she wishes to proceed. Unable to do [place] due to insurance. Discussed importance of  memory exercises but reports limited due to vision. Discussed routine follow up with ophthalmology. Plan on MMSE at follow up visit Cardiac monitor showed atrial fibrillation in 09/2020 cMRI 10/2020 negative for LV thrombus Continue  Eliquis 5 mg twice daily  and atorvastatin 80 mg daily for secondary stroke prevention.   Discussed secondary stroke prevention measures and importance of close PCP follow up for aggressive stroke risk factor management. I have gone over the pathophysiology of stroke, warning signs and symptoms, risk factors and their management in some detail with instructions to go to the closest emergency room for symptoms of concern. Atrial fibrillation: On Eliquis 5 mg twice daily for CHA2DS2-VASc score of 9. Routinely followed by cardiology for monitoring and management  HTN: BP goal <130/90.  Stable on current regimen per PCP HLD: LDL goal <70. LDL 59 (09/2020) on atorvastatin 80 mg daily managed by PCP DMII: A1c goal<7.0. A1c 6.0.  Managed by PCP    Follow up in 6 months or call earlier if needed   CC:  PCP: Shirline Frees, MD    I spent 37 minutes of face-to-face and non-face-to-face time with patient and family.  This included previsit chart review, lab review, study review, electronic health record documentation, patient and family education regarding prior stroke including etiology, secondary stroke prevention measures and importance of managing stroke risk factors, residual deficits and typical recovery time and answered all other questions to patient satisfaction  Frann Rider, [facility]  [facility] [address] [place],  [address]  Phone [phone] Fax [phone] Note: This document was prepared with digital dictation and possible smart phrase technology. Any transcriptional errors that result from this process are unintentional.

## 2021-02-12 NOTE — Patient Instructions (Signed)
Consider doing outpatient cognitive therapy - if you would like to proceed, let me know and I will place the order   Continue Eliquis (apixaban) daily  and atorvastatin 80mg  daily  for secondary stroke prevention  Continue to follow up with PCP regarding cholesterol and blood pressure management  Maintain strict control of hypertension with blood pressure goal below 130/90 and cholesterol with LDL cholesterol (bad cholesterol) goal below 70 mg/dL.   Signs of a Stroke? Follow the BEFAST method:  Balance Watch for a sudden loss of balance, trouble with coordination or vertigo Eyes Is there a sudden loss of vision in one or both eyes? Or double vision?  Face: Ask the person to smile. Does one side of the face droop or is it numb?  Arms: Ask the person to raise both arms. Does one arm drift downward? Is there weakness or numbness of a leg? Speech: Ask the person to repeat a simple phrase. Does the speech sound slurred/strange? Is the person confused ? Time: If you observe any of these signs, call 911.     Followup in the future with me in 6 months or call earlier if needed       Thank you for coming to see [place] at [facility]. I hope we have been able to provide you high quality care today.  You may receive a patient satisfaction survey over the next few weeks. We would appreciate your feedback and comments so that we may continue to improve ourselves and the health of our patients.

## 2021-02-13 ENCOUNTER — Ambulatory Visit (INDEPENDENT_AMBULATORY_CARE_PROVIDER_SITE_OTHER): Payer: Medicare Other

## 2021-02-13 ENCOUNTER — Other Ambulatory Visit: Payer: Self-pay

## 2021-02-13 ENCOUNTER — Encounter: Payer: Self-pay | Admitting: Physician Assistant

## 2021-02-13 ENCOUNTER — Ambulatory Visit: Payer: Medicare Other | Admitting: Physician Assistant

## 2021-02-13 DIAGNOSIS — S4292XA Fracture of left shoulder girdle, part unspecified, initial encounter for closed fracture: Secondary | ICD-10-CM | POA: Diagnosis not present

## 2021-02-13 NOTE — Progress Notes (Signed)
HPI: Yolanda Saunders returns today for follow-up for her left proximal humerus fracture.  Left proximal fracture again occurred on 12/13/2020.  She states that overall her shoulder pain is improving.  She does not have full range of motion shoulders taking no pain medication she is not wearing the sling at this point time.  Again she is left-hand dominant.  Physical exam: Left shoulder fluid motion with external and internal rotation.  Actively she can forward flex her arm to approximately 90 degrees passively I can come to about 100 to 120 degrees.  She has full range of motion left elbow.  Full supination pronation forearm.  Radiographs left shoulder 3 views: No change in overall position alignment.  Shoulders well located.  Glenohumeral joint appears well maintained.  No evidence of avascular necrosis of the humeral head.   Impression: Left proximal humerus fracture  Plan: At this point time recommend due to the fact that her dominant arm conservative therapy to work on range of motion strengthening.  She defers.  States she has too many other health problems does not want to go to therapy therefore recommend that she get a pulley system for her shoulder and begin working on overhead range of motion.  She is agreeable with this.  Have her follow-up with Yolanda Saunders in 6 weeks to see how she is doing overall.  Recommend 3 views of the shoulder at that time.

## 2021-02-18 NOTE — Progress Notes (Incomplete)
Cardiology Office Note:    Date:  02/18/2021   ID:  Yolanda Saunders, Yolanda Saunders 23-Aug-1945, MRN [id number]  PCP:  Shirline Frees, MD  Cardiologist:  Skeet Latch, MD    Referring MD: Shirline Frees, MD   No chief complaint on file.   History of Present Illness:    Yolanda Saunders is a 76 y.o. female with a hx of ***  Past Medical History:  Diagnosis Date   Anemia 04/17/11   "long, long years ago"   Angina 04/16/11   "that's what I'm here for"   Anxiety    Arthritis    Hips and knees   Asthma    Carpal tunnel syndrome    Cataracts, bilateral    CKD (chronic kidney disease), stage III ([place])    Complication of anesthesia 1987   "affected my eyes; couldn't see anything but blurrs even the next day"   CVA (cerebral infarction)    After cardiac catheter 02/2000   Depression    Edema    Fibromyalgia    GERD (gastroesophageal reflux disease)    Headache(784.0)    High cholesterol    History of bronchitis    Hypertension    IBS (irritable bowel syndrome)    Migraines    Panic attacks 04/17/11   "don't take anything for it"   Pneumonia 04/17/11   "probably as many as 7 times"   Renal disorder 04/17/11   "they are working at 60% capacity"   Shortness of breath on exertion    "cause of my asthma"   Stroke ([place]) 2002   residual "problem w/using the right word, left 5 lesions on my brain/MRI; long term memory loss"   Type II diabetes mellitus ([place])     Past Surgical History:  Procedure Laterality Date   CARDIAC CATHETERIZATION  2002   CARPAL TUNNEL RELEASE  2003-2010   "twice on left; once on right"   [place] Bilateral 11/2019   Lenses Implant   histerectomy     LUMBAR LAMINECTOMY/ DECOMPRESSION WITH MET-RX Right 06/01/2020   Procedure: Right Lumbar Four-Five Minimally invasive discectomy;  Surgeon: Judith Part, MD;  Location: [facility];  Service: Neurosurgery;  Laterality: Right;   RIGHT/LEFT HEART  CATH AND CORONARY ANGIOGRAPHY N/A 08/07/2020   Procedure: RIGHT/LEFT HEART CATH AND CORONARY ANGIOGRAPHY;  Surgeon: Belva Crome, MD;  Location: [facility];  Service: Cardiovascular;  Laterality: N/A;   VAGINAL HYSTERECTOMY  1977    Current Medications: No outpatient medications have been marked as taking for the 02/20/21 encounter (Appointment) with Skeet Latch, MD.     Allergies:   Codeine, Erythromycin, Lisinopril, Penicillins, Shellfish allergy, Sulfonamide derivatives, Metoprolol, Actos [pioglitazone], Amlodipine, Benicar [olmesartan], Byetta 10 mcg pen [exenatide], Clonidine hcl, Cyclobenzaprine, Gabapentin, Glimepiride, Glipizide, Glyburide-metformin, Humalog [insulin lispro], Hydralazine hcl, Invokana [canagliflozin], Januvia [sitagliptin], Other, Prednisone, Reglan [metoclopramide], Septra [sulfamethoxazole-trimethoprim], Spironolactone, Tramadol, Victoza [liraglutide], Zocor [simvastatin], Losartan potassium, and Novolin r [insulin]   Social History   Socioeconomic History   Marital status: Married    Spouse name: Not on file   Number of children: Not on file   Years of education: Not on file   Highest education level: Not on file  Occupational History   Not on file  Tobacco Use   Smoking status: Former    Packs/day: 0.75    Years: 6.00    Pack years: 4.50    Types: Cigarettes  Quit date: 01/21/1980    Years since quitting: 41.1   Smokeless tobacco: Never  Vaping Use   Vaping Use: Not on file  Substance and Sexual Activity   Alcohol use: No    Alcohol/week: 0.0 standard drinks   Drug use: No   Sexual activity: Not Currently  Other Topics Concern   Not on file  Social History Narrative   Lives with husband in a one story home.  Has 2 children.  Retired from [employer].  Education: 12th grade.  Trade schools.    Social Determinants of Health   Financial Resource Strain: Not on file  Food Insecurity: Not on file  Transportation Needs: Not on file   Physical Activity: Not on file  Stress: Not on file  Social Connections: Not on file     Family History: The patient's ***family history includes Aortic aneurysm in her son; CVA in her mother; Coronary artery disease in her father; Diabetes Mellitus I in her brother, brother, father, maternal grandfather, maternal grandmother, mother, paternal grandfather, paternal grandmother, and sister; Stroke in her mother.  ROS:   Please see the history of present illness.    ROS  All other systems reviewed and negative.   EKGs/Labs/Other Studies Reviewed:    The following studies were reviewed today: ***  EKG:  EKG is *** ordered today.  The ekg ordered today demonstrates ***  Recent Labs: 09/25/2020: ALT 18 01/21/2021: BUN 17; Creatinine, Ser 1.25; Hemoglobin 12.9; Magnesium 1.9; Platelets 165; Potassium 3.7; Sodium 138   Recent Lipid Panel    Component Value Date/Time   CHOL 114 09/25/2020 0951   TRIG 123 09/25/2020 0951   HDL 33 (L) 09/25/2020 0951   CHOLHDL 3.5 09/25/2020 0951   CHOLHDL 9.3 08/03/2020 0119   VLDL 27 08/03/2020 0119   LDLCALC 59 09/25/2020 0951   LDLDIRECT 144.0 09/24/2018 1123    CHA2DS2-VASc Score = 9 [CHF History: 1, HTN History: 1, Diabetes History: 1, Stroke History: 2, Vascular Disease History: 1, Age Score: 2, Gender Score: 1].  Therefore, the patient's annual risk of stroke is 12.2 %.        Physical Exam:    VS:  There were no vitals taken for this visit.    Wt Readings from Last 3 Encounters:  02/12/21 146 lb (66.2 kg)  01/21/21 140 lb (63.5 kg)  12/13/20 156 lb (70.8 kg)     GEN: *** Well nourished, well developed in no acute distress HEENT: Normal NECK: No JVD; No carotid bruits LYMPHATICS: No lymphadenopathy CARDIAC: ***RRR, no murmurs, rubs, gallops RESPIRATORY:  Clear to auscultation without rales, wheezing or rhonchi  ABDOMEN: Soft, non-tender, non-distended MUSCULOSKELETAL:  No edema; No deformity  SKIN: Warm and dry NEUROLOGIC:   Alert and oriented x 3 PSYCHIATRIC:  Normal affect   ASSESSMENT:    No diagnosis found. PLAN:    In order of problems listed above:  .[facility]arrehab   Time Spent: *** minutes total time of encounter, including *** minutes spent in face-to-face patient care on the date of this encounter. This time includes coordination of care and counseling regarding above mentioned problem list. Remainder of non-face-to-face time involved reviewing chart documents/testing relevant to the patient encounter and documentation in the medical record. I have independently reviewed documentation from referring provider  Medication Adjustments/Labs and Tests Ordered: Current medicines are reviewed at length with the patient today.  Concerns regarding medicines are outlined above.  No orders of the defined types were placed in this encounter.  No  orders of the defined types were placed in this encounter.   Yolanda Saunders  02/18/2021 9:31 AM    [facility]

## 2021-02-20 ENCOUNTER — Ambulatory Visit (HOSPITAL_BASED_OUTPATIENT_CLINIC_OR_DEPARTMENT_OTHER): Payer: Medicare Other | Admitting: Cardiovascular Disease

## 2021-03-14 DIAGNOSIS — N3000 Acute cystitis without hematuria: Secondary | ICD-10-CM | POA: Diagnosis not present

## 2021-03-14 DIAGNOSIS — R300 Dysuria: Secondary | ICD-10-CM | POA: Diagnosis not present

## 2021-03-18 ENCOUNTER — Other Ambulatory Visit: Payer: Self-pay

## 2021-03-18 ENCOUNTER — Encounter (HOSPITAL_BASED_OUTPATIENT_CLINIC_OR_DEPARTMENT_OTHER): Payer: Self-pay | Admitting: Cardiovascular Disease

## 2021-03-18 ENCOUNTER — Ambulatory Visit (HOSPITAL_BASED_OUTPATIENT_CLINIC_OR_DEPARTMENT_OTHER): Payer: Medicare Other | Admitting: Cardiovascular Disease

## 2021-03-18 DIAGNOSIS — I10 Essential (primary) hypertension: Secondary | ICD-10-CM

## 2021-03-18 DIAGNOSIS — I5042 Chronic combined systolic (congestive) and diastolic (congestive) heart failure: Secondary | ICD-10-CM

## 2021-03-18 DIAGNOSIS — I6350 Cerebral infarction due to unspecified occlusion or stenosis of unspecified cerebral artery: Secondary | ICD-10-CM

## 2021-03-18 DIAGNOSIS — E785 Hyperlipidemia, unspecified: Secondary | ICD-10-CM

## 2021-03-18 NOTE — Progress Notes (Signed)
Cardiology Office Note   Date:  03/18/2021   ID:  Saunders, Yolanda May 24, 1945, MRN [id number]  PCP:  Shirline Frees, MD  Cardiologist:   Skeet Latch, MD   No chief complaint on file.     History of Present Illness: Yolanda Saunders is a 76 y.o. female with chronic systolic diastolic heart failure, PAF, medically managed obstructive coronary disease, CKD 3, stroke, diabetes, hypertension, and hyperlipidemia here for follow-up.  She was admitted 07/2020 with stroke.  She had a large right PCA infarct.  She was found to have 75% stenosis of the right PCA with severe distal left P2 stenosis.  MRI of the brain showed moderate acute right PCA infarct and underlying chronic microvascular ischemic disease.  She had some remote lacunar infarcts in the basal ganglia and thalamus.  Echo that admission revealed LVEF 35 to 40%.  She also had COVID-19 and was treated with remdesivir.  She underwent left heart cath which revealed 100% occlusion of the mid LAD and D2.  She had left to left and right to left collaterals.  She also had 50% stenosis in the mid LAD and 70% distal OM 3 stenoses.  She had 60% stenosis in the RCA.  There is concern for apical thrombus as the source of her embolic stroke.  She was discharged with a 30-day monitor.  Outpatient cardiac MRI was recommended to rule out LV thrombus.  Her 30-day monitor did reveal atrial fibrillation.  She was started on Eliquis.  Cardiac MRI 10/2020 revealed LVEF 38% with LGE consistent with prior infarction.  There was no LV thrombus.  There was hypokinesis of the mid anterior, anteroseptal, inferoseptal, and inferior myocardium.  There was apical akinesis.  She followed up with Yolanda Montana, NP on 10/2020.  Her blood pressure was above goal but she was not interested in increasing her medications or trying anything new.  Her husband notes that she is starting to get a little more walking.  She is limited by orthopedic pain.  She needed to  have her hip replaced prior to her stroke and has been unable to do it since that time.  She did have a fall and broke her left shoulder.  She denies any exertional chest pain or shortness of breath.  At home her blood pressure has been running in the 130s on average. BP at home as been running 130s on average.  She had an episode of chest heaviness on 01/2021.  She went to the emergency department and was found to be sinus rhythm with PVCs.  Cardiac enzymes were negative.  Prior antihypertensives:  Lisinopril Spironolactone Losartan Amlodipine Metoprolol  Past Medical History:  Diagnosis Date   Anemia 04/17/11   "long, long years ago"   Angina 04/16/11   "that's what I'm here for"   Anxiety    Arthritis    Hips and knees   Asthma    Carpal tunnel syndrome    Cataracts, bilateral    Chronic combined systolic and diastolic heart failure ([place]) 08/07/2020   CKD (chronic kidney disease), stage III ([place])    Complication of anesthesia 1987   "affected my eyes; couldn't see anything but blurrs even the next day"   CVA (cerebral infarction)    After cardiac catheter 02/2000   Depression    Edema    Fibromyalgia    GERD (gastroesophageal reflux disease)    Headache(784.0)    High cholesterol    History of bronchitis  Hypertension    IBS (irritable bowel syndrome)    Migraines    Panic attacks 04/17/11   "don't take anything for it"   Pneumonia 04/17/11   "probably as many as 7 times"   Renal disorder 04/17/11   "they are working at 60% capacity"   Shortness of breath on exertion    "cause of my asthma"   Stroke [facility]) 2002   residual "problem w/using the right word, left 5 lesions on my brain/MRI; long term memory loss"   Type II diabetes mellitus ([place])     Past Surgical History:  Procedure Laterality Date   CARDIAC CATHETERIZATION  2002   CARPAL TUNNEL RELEASE  2003-2010   "twice on left; once on right"   [place]  Bilateral 11/2019   Lenses Implant   histerectomy     LUMBAR LAMINECTOMY/ DECOMPRESSION WITH MET-RX Right 06/01/2020   Procedure: Right Lumbar Four-Five Minimally invasive discectomy;  Surgeon: Judith Part, MD;  Location: [facility];  Service: Neurosurgery;  Laterality: Right;   RIGHT/LEFT HEART CATH AND CORONARY ANGIOGRAPHY N/A 08/07/2020   Procedure: RIGHT/LEFT HEART CATH AND CORONARY ANGIOGRAPHY;  Surgeon: Belva Crome, MD;  Location: [facility];  Service: Cardiovascular;  Laterality: N/A;   VAGINAL HYSTERECTOMY  1977     Current Outpatient Medications  Medication Sig Dispense Refill   acetaminophen (TYLENOL) 325 MG tablet Take 2 tablets (650 mg total) by mouth every 6 (six) hours as needed for mild pain (or temp > 37.5 C (99.5 F)).     apixaban (ELIQUIS) 5 MG TABS tablet Take 1 tablet (5 mg total) by mouth 2 (two) times daily. 60 tablet 5   atorvastatin (LIPITOR) 80 MG tablet Take 1 tablet (80 mg total) by mouth daily. 30 tablet 0   Blood Glucose Monitoring Suppl (ONETOUCH VERIO) w/Device KIT 1 each by Does not apply route daily. 1 kit 0   carvedilol (COREG) 3.125 MG tablet Take 0.5 tablets (1.56 mg total) by mouth 2 (two) times daily with a meal. 1/2 tab twice daily     Cholecalciferol (VITAMIN D3) 125 MCG (5000 UT) TABS Take 1 tablet (5,000 Units total) by mouth once a week. 30 tablet    diazepam (VALIUM) 5 MG tablet Take one tablet 30 minutes prior to cardiac MRI. Take second tablet if needed. 2 tablet 0   furosemide (LASIX) 40 MG tablet Take 0.5 tablets (20 mg total) by mouth daily. 30 tablet    glucose blood (ONETOUCH VERIO) test strip USE 1 STRIP TO CHECK GLUCOSE 4x TIMES DAILY (Patient taking differently: 1 each by Other route See admin instructions. USE 1 STRIP TO CHECK GLUCOSE 4x TIMES DAILY) 200 each 11   insulin glargine (LANTUS) 100 UNIT/ML injection Inject 0.1 mLs (10 Units total) into the skin daily. 20 mL 3   Insulin Syringe-Needle U-100 (RELION INSULIN SYR  0.5ML/31G) 31G X 5/16" 0.5 ML MISC USE 1  THREE TIMES DAILY 300 each 0   irbesartan (AVAPRO) 75 MG tablet Take 37.5 mg by mouth daily.     nitrofurantoin, macrocrystal-monohydrate, (MACROBID) 100 MG capsule Take 100 mg by mouth every 12 (twelve) hours.     pantoprazole (PROTONIX) 40 MG tablet Take 1 tablet (40 mg total) by mouth daily. 30 tablet 0   potassium chloride SA (KLOR-CON) 20 MEQ tablet Take 20 meq daily for the next 4 days only 4 tablet 0   sertraline (ZOLOFT) 25  MG tablet Take 12.5-25 mg by mouth daily.     Syringe/Needle, Disp, (SYRINGE LUER LOCK) 23G X 1" 3 ML MISC 1 Package by Does not apply route as needed. (Patient taking differently: 1 Package by Does not apply route daily.) 50 each 0   vitamin B-12 (CYANOCOBALAMIN) 1000 MCG tablet Take 1,000 mcg by mouth daily.     No current facility-administered medications for this visit.    Allergies:   Codeine, Erythromycin, Lisinopril, Penicillins, Shellfish allergy, Sulfonamide derivatives, Metoprolol, Actos [pioglitazone], Amlodipine, Benicar [olmesartan], Byetta 10 mcg pen [exenatide], Clonidine hcl, Cyclobenzaprine, Gabapentin, Glimepiride, Glipizide, Glyburide-metformin, Humalog [insulin lispro], Hydralazine hcl, Invokana [canagliflozin], Januvia [sitagliptin], Other, Prednisone, Reglan [metoclopramide], Septra [sulfamethoxazole-trimethoprim], Spironolactone, Tramadol, Victoza [liraglutide], Zocor [simvastatin], Losartan potassium, and Novolin r [insulin]    Social History:  The patient  reports that she quit smoking about 41 years ago. Her smoking use included cigarettes. She has a 4.50 pack-year smoking history. She has never used smokeless tobacco. She reports that she does not drink alcohol and does not use drugs.   Family History:  The patient's family history includes Aortic aneurysm in her son; CVA in her mother; Coronary artery disease in her father; Diabetes Mellitus I in her brother, brother, father, maternal grandfather,  maternal grandmother, mother, paternal grandfather, paternal grandmother, and sister; Stroke in her mother.    ROS:  Please see the history of present illness.   Otherwise, review of systems are positive for none.   All other systems are reviewed and negative.    PHYSICAL EXAM: VS:  BP 140/60    Pulse (!) 58    Ht $R'5\' 3"'EG$  (1.6 m)    Wt 149 lb 3.2 oz (67.7 kg)    SpO2 97%    BMI 26.43 kg/m  , BMI Body mass index is 26.43 kg/m. GENERAL:  Well appearing HEENT:  Pupils equal round and reactive, fundi not visualized, oral mucosa unremarkable NECK:  No jugular venous distention, waveform within normal limits, carotid upstroke brisk and symmetric, no bruits, no thyromegaly LUNGS:  Clear to auscultation bilaterally HEART:  RRR.  PMI not displaced or sustained,S1 and S2 within normal limits, no S3, no S4, no clicks, no rubs, no murmurs ABD:  Flat, positive bowel sounds normal in frequency in pitch, no bruits, no rebound, no guarding, no midline pulsatile mass, no hepatomegaly, no splenomegaly EXT:  2 plus pulses throughout, no edema, no cyanosis no clubbing SKIN:  No rashes no nodules NEURO:  Cranial nerves II through XII grossly intact, motor grossly intact throughout PSYCH:  Cognitively intact, oriented to person place and time   EKG:  EKG  ordered today.  Echo 08/03/20: 1. No left ventricular thrombus is seen with Definity contrast. Left  ventricular ejection fraction, by estimation, is 35 to 40%. The left  ventricle has moderately decreased function. The left ventricle has no  regional wall motion abnormalities. Left  ventricular diastolic parameters are consistent with Grade I diastolic  dysfunction (impaired relaxation). There is severe hypokinesis of the left  ventricular, mid-apical anteroseptal wall and anterior wall. There is mild  dyskinesis of the left  ventricular, entire apical segment.   2. Right ventricular systolic function is normal. The right ventricular  size is normal.  Tricuspid regurgitation signal is inadequate for assessing  PA pressure.   3. The mitral valve is normal in structure. Trivial mitral valve  regurgitation. No evidence of mitral stenosis.   4. The aortic valve is normal in structure. Aortic valve regurgitation is  mild.  No aortic stenosis is present.   5. The inferior vena cava is normal in size with greater than 50%  respiratory variability, suggesting right atrial pressure of 3 mmHg.   LHC 08/07/20:  Mid LAD lesion is 100% stenosed.   2nd Diag lesion is 100% stenosed.   Total occlusion of the proximal to mid LAD with left to left and right to left collaterals.  LAD does not wraparound apex.  The first diagonal is large and also fills by collaterals. Irregularities with up to 50% in mid circumflex.  The very distal third obtuse marginal contains segmental 70% stenosis. Left main is widely patent RCA has diffuse 60% mid to distal narrowing after the first acute marginal ostium. Right heart pressures are normal LVEDP is normal   RECOMMENDATIONS: Consider MRI to rule out apical thrombus versus empirical long-term anticoagulation therapy for presumed LV thrombus with embolic CVA.  Cardiac MRI 11/08/20: 1. Mildly increased left ventricular size, with LVEDD 52 mm, but LVEDVi 95 mL/m2.   Normal left ventricular thickness, with intraventricular septal thickness of 8 mm, posterior wall thickness of 6 mm, and myocardial mass index of 51 g/m2.   Moderately reduced left ventricular systolic function (LVEF = 38%). There are regional wall motion abnormalities: There is hypokinesis of the mid anterior, anteroseptal, inferoseptal, and inferior segments. There is akinesis of the apex and all apical segments.   Left ventricular parametric mapping notable for normal T2 signal mild increase in the mid LV segments (30-40%) and significant increase in the apex (46-54%) .   There is late gadolinium enhancement in the left ventricular myocardium:  There is apical inferior 75 % LGE suggestive of infarction. There is apical anterior septum and anterior remodeling and thinning.   No LV thrombus noted.   2. Normal right ventricular size with RVEDVI 65 mL/m2.   Normal right ventricular thickness.   Normal right ventricular systolic function (RVEF =92%). There are no regional wall motion abnormalities or aneurysms.   3.  Normal left and right atrial size.   4. Normal size of the aortic root, ascending aorta and pulmonary artery.   5.  Qualitatively no significant valvular abnormalities.   6.  Normal pericardium.  No pericardial effusion.   7. Grossly, no extracardiac findings. Recommended dedicated study if concerned for non-cardiac pathology.   IMPRESSION: Ischemic Cardiomyopathy and evidence of LAD infarct. No LV thrombus noted.   Recent Labs: 09/25/2020: ALT 18 01/21/2021: BUN 17; Creatinine, Ser 1.25; Hemoglobin 12.9; Magnesium 1.9; Platelets 165; Potassium 3.7; Sodium 138    Lipid Panel    Component Value Date/Time   CHOL 114 09/25/2020 0951   TRIG 123 09/25/2020 0951   HDL 33 (L) 09/25/2020 0951   CHOLHDL 3.5 09/25/2020 0951   CHOLHDL 9.3 08/03/2020 0119   VLDL 27 08/03/2020 0119   LDLCALC 59 09/25/2020 0951   LDLDIRECT 144.0 09/24/2018 1123      Wt Readings from Last 3 Encounters:  03/18/21 149 lb 3.2 oz (67.7 kg)  02/12/21 146 lb (66.2 kg)  01/21/21 140 lb (63.5 kg)      ASSESSMENT AND PLAN:  Cerebral artery occlusion with cerebral infarction [facility]) Prior stroke.  She is now on Eliquis as she was found to have atrial fibrillation on her ILR.  Continue with blood pressure management and Eliquis.  Essential hypertension Systolic pressure is elevated today.  However at home she reports it has been better controlled.  Her diastolic blood pressure is at 60.  Given that she has severe coronary  disease, we will not push any harder with her blood pressure medications in order to avoid low diastolic blood  pressure and poor coronary filling feeling.  Continue carvedilol and irbesartan.  She is very sensitive to higher doses of her medication.  Recommended that she work on increasing her exercise but she is unable to due to the orthopedic issues.  We also discussed the PR EP program at the [facility] or going to the gym.  However she and her husband both have limited mobility and difficulty driving.  She will keep trying to increase her exercise at home.   Hyperlipidemia Lipids are well controlled on atorvastatin.  Chronic combined systolic and diastolic heart failure (HCC) Ischemic cardiomyopathy.  She has CTO in the LAD and D2 with L-L and R->L collaterals.  Continue medical management.   Current medicines are reviewed at length with the patient today.  The patient does not have concerns regarding medicines.  The following changes have been made:  no change  Labs/ tests ordered today include:  No orders of the defined types were placed in this encounter.    Disposition:   FU with Yolanda Wedin C. Oval Linsey, MD, [facility] in 6 months.     Signed, Kenton Fortin C. Oval Linsey, MD, [facility]  03/18/2021 2:41 PM    [place]

## 2021-03-18 NOTE — Assessment & Plan Note (Signed)
Prior stroke.  She is now on Eliquis as she was found to have atrial fibrillation on her ILR.  Continue with blood pressure management and Eliquis.

## 2021-03-18 NOTE — Patient Instructions (Signed)
Medication Instructions:  Your physician recommends that you continue on your current medications as directed. Please refer to the Current Medication list given to you today.   *If you need a refill on your cardiac medications before your next appointment, please call your pharmacy*  Lab Work: NONE  Testing/Procedures: NONE  Follow-Up: At [facility], you and your health needs are our priority.  As part of our continuing mission to provide you with exceptional heart care, we have created designated Provider Care Teams.  These Care Teams include your primary Cardiologist (physician) and Advanced Practice Providers (APPs -  Physician Assistants and Nurse Practitioners) who all work together to provide you with the care you need, when you need it.  We recommend signing up for the patient portal called "MyChart".  Sign up information is provided on this After Visit Summary.  MyChart is used to connect with patients for Virtual Visits (Telemedicine).  Patients are able to view lab/test results, encounter notes, upcoming appointments, etc.  Non-urgent messages can be sent to your provider as well.   To learn more about what you can do with MyChart, go to [email or web address].    Your next appointment:   6 month(s)  The format for your next appointment:   In Person  Provider:   Tiffany Morrison, MD            

## 2021-03-18 NOTE — Assessment & Plan Note (Signed)
Ischemic cardiomyopathy.  She has CTO in the LAD and D2 with L-L and R->L collaterals.  Continue medical management.

## 2021-03-18 NOTE — Assessment & Plan Note (Addendum)
Systolic pressure is elevated today.  However at home she reports it has been better controlled.  Her diastolic blood pressure is at 60.  Given that she has severe coronary disease, we will not push any harder with her blood pressure medications in order to avoid low diastolic blood pressure and poor coronary filling feeling.  Continue carvedilol and irbesartan.  She is very sensitive to higher doses of her medication.  Recommended that she work on increasing her exercise but she is unable to due to the orthopedic issues.  We also discussed the PR EP program at the [facility] or going to the gym.  However she and her husband both have limited mobility and difficulty driving.  She will keep trying to increase her exercise at home.

## 2021-03-18 NOTE — Assessment & Plan Note (Signed)
Lipids are well-controlled on atorvastatin. 

## 2021-03-27 ENCOUNTER — Ambulatory Visit: Payer: Self-pay

## 2021-03-27 ENCOUNTER — Encounter: Payer: Self-pay | Admitting: Orthopaedic Surgery

## 2021-03-27 ENCOUNTER — Ambulatory Visit (INDEPENDENT_AMBULATORY_CARE_PROVIDER_SITE_OTHER): Payer: Medicare Other | Admitting: Orthopaedic Surgery

## 2021-03-27 DIAGNOSIS — S4292XD Fracture of left shoulder girdle, part unspecified, subsequent encounter for fracture with routine healing: Secondary | ICD-10-CM

## 2021-03-27 DIAGNOSIS — S4292XA Fracture of left shoulder girdle, part unspecified, initial encounter for closed fracture: Secondary | ICD-10-CM

## 2021-03-27 NOTE — Progress Notes (Signed)
The patient is now over 3 months status post a mechanical fall in which she sustained a proximal humerus fracture of the left shoulder.  She is 76 years old.  She is also been recovering from a stroke.  She is doing much better overall and feels like things are coming along.  She has deferred any physical therapy.  She does report that she is able to perform her necessary activities day living with her left shoulder.  It does pop on occasion but only hurts when people are manipulating her shoulder. ? ?On clinical exam her left shoulder is clinically well located.  She does show deficits in range of motion and weakness of the rotator cuff but overall it is a functional shoulder. ? ?X-rays left shoulder show impacted proximal humerus fracture that is healed.  The glenohumeral joint is well located. ? ?At this point follow-up can be as needed since she is doing well overall.  Again offered her physical therapy as an outpatient but she would like to just do things on her own so I showed her some exercises to try.  All questions and concerns were answered and addressed. ?

## 2021-04-04 DIAGNOSIS — R600 Localized edema: Secondary | ICD-10-CM | POA: Diagnosis not present

## 2021-04-04 DIAGNOSIS — N183 Chronic kidney disease, stage 3 unspecified: Secondary | ICD-10-CM | POA: Diagnosis not present

## 2021-04-04 DIAGNOSIS — Z87440 Personal history of urinary (tract) infections: Secondary | ICD-10-CM | POA: Diagnosis not present

## 2021-04-04 DIAGNOSIS — I10 Essential (primary) hypertension: Secondary | ICD-10-CM | POA: Diagnosis not present

## 2021-04-04 DIAGNOSIS — E7800 Pure hypercholesterolemia, unspecified: Secondary | ICD-10-CM | POA: Diagnosis not present

## 2021-04-04 DIAGNOSIS — M797 Fibromyalgia: Secondary | ICD-10-CM | POA: Diagnosis not present

## 2021-04-04 DIAGNOSIS — M810 Age-related osteoporosis without current pathological fracture: Secondary | ICD-10-CM | POA: Diagnosis not present

## 2021-04-04 DIAGNOSIS — I480 Paroxysmal atrial fibrillation: Secondary | ICD-10-CM | POA: Diagnosis not present

## 2021-04-04 DIAGNOSIS — I679 Cerebrovascular disease, unspecified: Secondary | ICD-10-CM | POA: Diagnosis not present

## 2021-04-04 DIAGNOSIS — K219 Gastro-esophageal reflux disease without esophagitis: Secondary | ICD-10-CM | POA: Diagnosis not present

## 2021-04-04 DIAGNOSIS — R8281 Pyuria: Secondary | ICD-10-CM | POA: Diagnosis not present

## 2021-04-04 DIAGNOSIS — E1122 Type 2 diabetes mellitus with diabetic chronic kidney disease: Secondary | ICD-10-CM | POA: Diagnosis not present

## 2021-05-09 ENCOUNTER — Other Ambulatory Visit (HOSPITAL_BASED_OUTPATIENT_CLINIC_OR_DEPARTMENT_OTHER): Payer: Self-pay | Admitting: Family

## 2021-05-09 NOTE — Telephone Encounter (Signed)
Prescription refill request for Eliquis received. ?Indication:CVA ?Last office visit:2/23 ?Scr:1.2 ?Age: 76 ?Weight:67.7 kg ? ?Prescription refilled ? ?

## 2021-07-04 DIAGNOSIS — I679 Cerebrovascular disease, unspecified: Secondary | ICD-10-CM | POA: Diagnosis not present

## 2021-07-04 DIAGNOSIS — E1121 Type 2 diabetes mellitus with diabetic nephropathy: Secondary | ICD-10-CM | POA: Diagnosis not present

## 2021-07-04 DIAGNOSIS — I10 Essential (primary) hypertension: Secondary | ICD-10-CM | POA: Diagnosis not present

## 2021-07-04 DIAGNOSIS — R202 Paresthesia of skin: Secondary | ICD-10-CM | POA: Diagnosis not present

## 2021-07-04 DIAGNOSIS — N183 Chronic kidney disease, stage 3 unspecified: Secondary | ICD-10-CM | POA: Diagnosis not present

## 2021-07-04 DIAGNOSIS — R300 Dysuria: Secondary | ICD-10-CM | POA: Diagnosis not present

## 2021-07-04 DIAGNOSIS — E7800 Pure hypercholesterolemia, unspecified: Secondary | ICD-10-CM | POA: Diagnosis not present

## 2021-07-24 ENCOUNTER — Other Ambulatory Visit: Payer: Self-pay | Admitting: Internal Medicine

## 2021-07-25 ENCOUNTER — Inpatient Hospital Stay (HOSPITAL_COMMUNITY): Payer: Medicare Other

## 2021-07-25 ENCOUNTER — Inpatient Hospital Stay (HOSPITAL_COMMUNITY)
Admission: EM | Admit: 2021-07-25 | Discharge: 2021-07-30 | DRG: 208 | Disposition: A | Discharge status: Home / Self Care | Payer: Medicare Other | Attending: Family Medicine | Admitting: Family Medicine

## 2021-07-25 ENCOUNTER — Other Ambulatory Visit: Payer: Self-pay

## 2021-07-25 ENCOUNTER — Encounter (HOSPITAL_COMMUNITY): Payer: Self-pay | Admitting: Critical Care Medicine

## 2021-07-25 ENCOUNTER — Emergency Department (HOSPITAL_COMMUNITY): Payer: Medicare Other

## 2021-07-25 DIAGNOSIS — Z87892 Personal history of anaphylaxis: Secondary | ICD-10-CM

## 2021-07-25 DIAGNOSIS — F410 Panic disorder [episodic paroxysmal anxiety] without agoraphobia: Secondary | ICD-10-CM | POA: Diagnosis present

## 2021-07-25 DIAGNOSIS — I469 Cardiac arrest, cause unspecified: Secondary | ICD-10-CM

## 2021-07-25 DIAGNOSIS — E1165 Type 2 diabetes mellitus with hyperglycemia: Secondary | ICD-10-CM | POA: Diagnosis not present

## 2021-07-25 DIAGNOSIS — I5043 Acute on chronic combined systolic (congestive) and diastolic (congestive) heart failure: Secondary | ICD-10-CM | POA: Diagnosis present

## 2021-07-25 DIAGNOSIS — D649 Anemia, unspecified: Secondary | ICD-10-CM | POA: Diagnosis present

## 2021-07-25 DIAGNOSIS — Z9911 Dependence on respirator [ventilator] status: Secondary | ICD-10-CM | POA: Diagnosis not present

## 2021-07-25 DIAGNOSIS — J9602 Acute respiratory failure with hypercapnia: Secondary | ICD-10-CM | POA: Diagnosis not present

## 2021-07-25 DIAGNOSIS — J45909 Unspecified asthma, uncomplicated: Secondary | ICD-10-CM | POA: Diagnosis not present

## 2021-07-25 DIAGNOSIS — J810 Acute pulmonary edema: Secondary | ICD-10-CM | POA: Diagnosis not present

## 2021-07-25 DIAGNOSIS — Z90710 Acquired absence of both cervix and uterus: Secondary | ICD-10-CM

## 2021-07-25 DIAGNOSIS — Z823 Family history of stroke: Secondary | ICD-10-CM

## 2021-07-25 DIAGNOSIS — Z91013 Allergy to seafood: Secondary | ICD-10-CM

## 2021-07-25 DIAGNOSIS — K219 Gastro-esophageal reflux disease without esophagitis: Secondary | ICD-10-CM | POA: Diagnosis present

## 2021-07-25 DIAGNOSIS — I2510 Atherosclerotic heart disease of native coronary artery without angina pectoris: Secondary | ICD-10-CM | POA: Diagnosis present

## 2021-07-25 DIAGNOSIS — R609 Edema, unspecified: Secondary | ICD-10-CM | POA: Diagnosis not present

## 2021-07-25 DIAGNOSIS — Z66 Do not resuscitate: Secondary | ICD-10-CM | POA: Diagnosis present

## 2021-07-25 DIAGNOSIS — J9601 Acute respiratory failure with hypoxia: Secondary | ICD-10-CM | POA: Diagnosis not present

## 2021-07-25 DIAGNOSIS — Z794 Long term (current) use of insulin: Secondary | ICD-10-CM

## 2021-07-25 DIAGNOSIS — T4275XA Adverse effect of unspecified antiepileptic and sedative-hypnotic drugs, initial encounter: Secondary | ICD-10-CM | POA: Diagnosis not present

## 2021-07-25 DIAGNOSIS — J9690 Respiratory failure, unspecified, unspecified whether with hypoxia or hypercapnia: Secondary | ICD-10-CM | POA: Diagnosis present

## 2021-07-25 DIAGNOSIS — I499 Cardiac arrhythmia, unspecified: Secondary | ICD-10-CM | POA: Diagnosis not present

## 2021-07-25 DIAGNOSIS — E7800 Pure hypercholesterolemia, unspecified: Secondary | ICD-10-CM | POA: Diagnosis not present

## 2021-07-25 DIAGNOSIS — I255 Ischemic cardiomyopathy: Secondary | ICD-10-CM | POA: Diagnosis not present

## 2021-07-25 DIAGNOSIS — R002 Palpitations: Secondary | ICD-10-CM | POA: Diagnosis not present

## 2021-07-25 DIAGNOSIS — M797 Fibromyalgia: Secondary | ICD-10-CM | POA: Diagnosis present

## 2021-07-25 DIAGNOSIS — R0602 Shortness of breath: Secondary | ICD-10-CM | POA: Diagnosis not present

## 2021-07-25 DIAGNOSIS — Z8249 Family history of ischemic heart disease and other diseases of the circulatory system: Secondary | ICD-10-CM

## 2021-07-25 DIAGNOSIS — Z881 Allergy status to other antibiotic agents status: Secondary | ICD-10-CM

## 2021-07-25 DIAGNOSIS — Z8673 Personal history of transient ischemic attack (TIA), and cerebral infarction without residual deficits: Secondary | ICD-10-CM

## 2021-07-25 DIAGNOSIS — Z880 Allergy status to penicillin: Secondary | ICD-10-CM

## 2021-07-25 DIAGNOSIS — Z8701 Personal history of pneumonia (recurrent): Secondary | ICD-10-CM

## 2021-07-25 DIAGNOSIS — E873 Alkalosis: Secondary | ICD-10-CM | POA: Diagnosis not present

## 2021-07-25 DIAGNOSIS — R0789 Other chest pain: Secondary | ICD-10-CM | POA: Diagnosis not present

## 2021-07-25 DIAGNOSIS — Z885 Allergy status to narcotic agent status: Secondary | ICD-10-CM

## 2021-07-25 DIAGNOSIS — Z20822 Contact with and (suspected) exposure to covid-19: Secondary | ICD-10-CM | POA: Diagnosis present

## 2021-07-25 DIAGNOSIS — G479 Sleep disorder, unspecified: Secondary | ICD-10-CM | POA: Diagnosis present

## 2021-07-25 DIAGNOSIS — I952 Hypotension due to drugs: Secondary | ICD-10-CM | POA: Diagnosis not present

## 2021-07-25 DIAGNOSIS — N183 Chronic kidney disease, stage 3 unspecified: Secondary | ICD-10-CM | POA: Diagnosis not present

## 2021-07-25 DIAGNOSIS — E1122 Type 2 diabetes mellitus with diabetic chronic kidney disease: Secondary | ICD-10-CM | POA: Diagnosis present

## 2021-07-25 DIAGNOSIS — Z833 Family history of diabetes mellitus: Secondary | ICD-10-CM

## 2021-07-25 DIAGNOSIS — Z7901 Long term (current) use of anticoagulants: Secondary | ICD-10-CM

## 2021-07-25 DIAGNOSIS — I130 Hypertensive heart and chronic kidney disease with heart failure and stage 1 through stage 4 chronic kidney disease, or unspecified chronic kidney disease: Secondary | ICD-10-CM | POA: Diagnosis not present

## 2021-07-25 DIAGNOSIS — R404 Transient alteration of awareness: Secondary | ICD-10-CM | POA: Diagnosis not present

## 2021-07-25 DIAGNOSIS — Z634 Disappearance and death of family member: Secondary | ICD-10-CM | POA: Diagnosis not present

## 2021-07-25 DIAGNOSIS — Z743 Need for continuous supervision: Secondary | ICD-10-CM | POA: Diagnosis not present

## 2021-07-25 DIAGNOSIS — Z79899 Other long term (current) drug therapy: Secondary | ICD-10-CM

## 2021-07-25 DIAGNOSIS — Z888 Allergy status to other drugs, medicaments and biological substances status: Secondary | ICD-10-CM

## 2021-07-25 DIAGNOSIS — I5023 Acute on chronic systolic (congestive) heart failure: Secondary | ICD-10-CM

## 2021-07-25 DIAGNOSIS — R4020 Unspecified coma: Secondary | ICD-10-CM | POA: Diagnosis not present

## 2021-07-25 DIAGNOSIS — Z87891 Personal history of nicotine dependence: Secondary | ICD-10-CM

## 2021-07-25 DIAGNOSIS — R6889 Other general symptoms and signs: Secondary | ICD-10-CM | POA: Diagnosis not present

## 2021-07-25 LAB — I-STAT ARTERIAL BLOOD GAS, ED
Acid-Base Excess: 0 mmol/L (ref 0.0–2.0)
Bicarbonate: 28.7 mmol/L — ABNORMAL HIGH (ref 20.0–28.0)
Calcium, Ion: 1.24 mmol/L (ref 1.15–1.40)
HCT: 35 % — ABNORMAL LOW (ref 36.0–46.0)
Hemoglobin: 11.9 g/dL — ABNORMAL LOW (ref 12.0–15.0)
O2 Saturation: 96 %
Potassium: 5.6 mmol/L — ABNORMAL HIGH (ref 3.5–5.1)
Sodium: 138 mmol/L (ref 135–145)
TCO2: 31 mmol/L (ref 22–32)
pCO2 arterial: 64.3 mmHg — ABNORMAL HIGH (ref 32–48)
pH, Arterial: 7.258 — ABNORMAL LOW (ref 7.35–7.45)
pO2, Arterial: 99 mmHg (ref 83–108)

## 2021-07-25 LAB — CBC WITH DIFFERENTIAL/PLATELET
Abs Immature Granulocytes: 0 10*3/uL (ref 0.00–0.07)
Basophils Absolute: 0.1 10*3/uL (ref 0.0–0.1)
Basophils Relative: 1 %
Eosinophils Absolute: 0.1 10*3/uL (ref 0.0–0.5)
Eosinophils Relative: 1 %
HCT: 40 % (ref 36.0–46.0)
Hemoglobin: 11.8 g/dL — ABNORMAL LOW (ref 12.0–15.0)
Lymphocytes Relative: 49 %
Lymphs Abs: 6.8 10*3/uL — ABNORMAL HIGH (ref 0.7–4.0)
MCH: 30.9 pg (ref 26.0–34.0)
MCHC: 29.5 g/dL — ABNORMAL LOW (ref 30.0–36.0)
MCV: 104.7 fL — ABNORMAL HIGH (ref 80.0–100.0)
Monocytes Absolute: 0.7 10*3/uL (ref 0.1–1.0)
Monocytes Relative: 5 %
Neutro Abs: 6.1 10*3/uL (ref 1.7–7.7)
Neutrophils Relative %: 44 %
Platelets: 192 10*3/uL (ref 150–400)
RBC: 3.82 MIL/uL — ABNORMAL LOW (ref 3.87–5.11)
RDW: 13.8 % (ref 11.5–15.5)
WBC: 13.9 10*3/uL — ABNORMAL HIGH (ref 4.0–10.5)
nRBC: 0 % (ref 0.0–0.2)
nRBC: 0 /100 WBC

## 2021-07-25 LAB — I-STAT VENOUS BLOOD GAS, ED
Acid-base deficit: 8 mmol/L — ABNORMAL HIGH (ref 0.0–2.0)
Bicarbonate: 25.6 mmol/L (ref 20.0–28.0)
Calcium, Ion: 0.78 mmol/L — CL (ref 1.15–1.40)
HCT: 38 % (ref 36.0–46.0)
Hemoglobin: 12.9 g/dL (ref 12.0–15.0)
O2 Saturation: 99 %
Potassium: 4 mmol/L (ref 3.5–5.1)
Sodium: 144 mmol/L (ref 135–145)
TCO2: 29 mmol/L (ref 22–32)
pCO2, Ven: 102.9 mmHg (ref 44–60)
pH, Ven: 7.004 — CL (ref 7.25–7.43)
pO2, Ven: 197 mmHg — ABNORMAL HIGH (ref 32–45)

## 2021-07-25 LAB — ECHOCARDIOGRAM COMPLETE
Area-P 1/2: 3.42 cm2
Calc EF: 33.5 %
Height: 63 in
P 1/2 time: 341 msec
S' Lateral: 4 cm
Single Plane A2C EF: 31.5 %
Single Plane A4C EF: 35.8 %
Weight: 2388.02 oz

## 2021-07-25 LAB — I-STAT CHEM 8, ED
BUN: 38 mg/dL — ABNORMAL HIGH (ref 8–23)
Calcium, Ion: 0.77 mmol/L — CL (ref 1.15–1.40)
Chloride: 103 mmol/L (ref 98–111)
Creatinine, Ser: 1.4 mg/dL — ABNORMAL HIGH (ref 0.44–1.00)
Glucose, Bld: 322 mg/dL — ABNORMAL HIGH (ref 70–99)
HCT: 39 % (ref 36.0–46.0)
Hemoglobin: 13.3 g/dL (ref 12.0–15.0)
Potassium: 4.1 mmol/L (ref 3.5–5.1)
Sodium: 146 mmol/L — ABNORMAL HIGH (ref 135–145)
TCO2: 27 mmol/L (ref 22–32)

## 2021-07-25 LAB — CBG MONITORING, ED
Glucose-Capillary: 307 mg/dL — ABNORMAL HIGH (ref 70–99)
Glucose-Capillary: 306 mg/dL — ABNORMAL HIGH (ref 70–99)
Glucose-Capillary: 292 mg/dL — ABNORMAL HIGH (ref 70–99)

## 2021-07-25 LAB — COMPREHENSIVE METABOLIC PANEL
ALT: 68 U/L — ABNORMAL HIGH (ref 0–44)
AST: 68 U/L — ABNORMAL HIGH (ref 15–41)
Albumin: 3.6 g/dL (ref 3.5–5.0)
Alkaline Phosphatase: 65 U/L (ref 38–126)
Anion gap: 15 (ref 5–15)
BUN: 36 mg/dL — ABNORMAL HIGH (ref 8–23)
CO2: 23 mmol/L (ref 22–32)
Calcium: 9 mg/dL (ref 8.9–10.3)
Chloride: 106 mmol/L (ref 98–111)
Creatinine, Ser: 1.7 mg/dL — ABNORMAL HIGH (ref 0.44–1.00)
GFR, Estimated: 31 mL/min — ABNORMAL LOW (ref >60)
Glucose, Bld: 318 mg/dL — ABNORMAL HIGH (ref 70–99)
Potassium: 4.2 mmol/L (ref 3.5–5.1)
Sodium: 144 mmol/L (ref 135–145)
Total Bilirubin: 0.7 mg/dL (ref 0.3–1.2)
Total Protein: 6.2 g/dL — ABNORMAL LOW (ref 6.5–8.1)

## 2021-07-25 LAB — BRAIN NATRIURETIC PEPTIDE: B Natriuretic Peptide: 450.3 pg/mL — ABNORMAL HIGH (ref 0.0–100.0)

## 2021-07-25 LAB — TROPONIN I (HIGH SENSITIVITY)
Troponin I (High Sensitivity): 68 ng/L — ABNORMAL HIGH (ref <18)
Troponin I (High Sensitivity): 65 ng/L — ABNORMAL HIGH (ref <18)

## 2021-07-25 LAB — HEMOGLOBIN A1C
Hgb A1c MFr Bld: 7.8 % — ABNORMAL HIGH (ref 4.8–5.6)
Mean Plasma Glucose: 177.16 mg/dL

## 2021-07-25 LAB — LACTIC ACID, PLASMA
Lactic Acid, Venous: 4.1 mmol/L (ref 0.5–1.9)
Lactic Acid, Venous: 4.2 mmol/L (ref 0.5–1.9)

## 2021-07-25 LAB — MAGNESIUM: Magnesium: 2.3 mg/dL (ref 1.7–2.4)

## 2021-07-25 LAB — SARS CORONAVIRUS 2 BY RT PCR: SARS Coronavirus 2 by RT PCR: NEGATIVE

## 2021-07-25 LAB — D-DIMER, QUANTITATIVE: D-Dimer, Quant: 0.37 ug/mL-FEU (ref 0.00–0.50)

## 2021-07-25 MED ORDER — DOCUSATE SODIUM 50 MG/5ML PO LIQD: 100.0000 mg | Freq: Two times a day (BID) | ORAL | Status: DC | PRN

## 2021-07-25 MED ORDER — FENTANYL CITRATE (PF) 100 MCG/2ML IJ SOLN
INTRAMUSCULAR | Status: AC
  Filled 2021-07-25: qty 2

## 2021-07-25 MED ORDER — POLYETHYLENE GLYCOL 3350 17 G PO PACK: 17.0000 g | PACK | Freq: Every day | ORAL | Status: DC | PRN

## 2021-07-25 MED ORDER — CALCIUM GLUCONATE-NACL 1-0.675 GM/50ML-% IV SOLN
1.0000 g | Freq: Once | INTRAVENOUS | Status: AC
  Administered 2021-07-25: 1000 mg via INTRAVENOUS
  Filled 2021-07-25: qty 50

## 2021-07-25 MED ORDER — HEPARIN BOLUS VIA INFUSION
2000.0000 [IU] | Freq: Once | INTRAVENOUS | Status: AC
  Administered 2021-07-25: 2000 [IU] via INTRAVENOUS
  Filled 2021-07-25: qty 2000

## 2021-07-25 MED ORDER — PROPOFOL 1000 MG/100ML IV EMUL
INTRAVENOUS | Status: AC
  Administered 2021-07-25: 40 ug/kg/min via INTRAVENOUS
  Filled 2021-07-25: qty 100

## 2021-07-25 MED ORDER — ETOMIDATE 2 MG/ML IV SOLN
INTRAVENOUS | Status: DC | PRN
  Administered 2021-07-25: 20 mg via INTRAVENOUS

## 2021-07-25 MED ORDER — MIDAZOLAM HCL 2 MG/2ML IJ SOLN
1.0000 mg | INTRAMUSCULAR | Status: DC | PRN
  Administered 2021-07-26: 1 mg via INTRAVENOUS
  Filled 2021-07-25: qty 2

## 2021-07-25 MED ORDER — MIDAZOLAM HCL 2 MG/2ML IJ SOLN
1.0000 mg | INTRAMUSCULAR | Status: DC | PRN
  Administered 2021-07-25: 1 mg via INTRAVENOUS
  Filled 2021-07-25: qty 2

## 2021-07-25 MED ORDER — DOCUSATE SODIUM 100 MG PO CAPS: 100.0000 mg | ORAL_CAPSULE | Freq: Two times a day (BID) | ORAL | Status: DC | PRN

## 2021-07-25 MED ORDER — HEPARIN (PORCINE) 25000 UT/250ML-% IV SOLN
1100.0000 [IU]/h | INTRAVENOUS | Status: DC
  Administered 2021-07-25: 1100 [IU]/h via INTRAVENOUS
  Filled 2021-07-25: qty 250

## 2021-07-25 MED ORDER — FUROSEMIDE 10 MG/ML IJ SOLN
40.0000 mg | Freq: Once | INTRAMUSCULAR | Status: AC
  Administered 2021-07-25: 40 mg via INTRAVENOUS
  Filled 2021-07-25: qty 4

## 2021-07-25 MED ORDER — FENTANYL CITRATE PF 50 MCG/ML IJ SOSY
25.0000 ug | PREFILLED_SYRINGE | INTRAMUSCULAR | Status: AC | PRN
  Administered 2021-07-26 (×3): 25 ug via INTRAVENOUS
  Filled 2021-07-25 (×3): qty 1

## 2021-07-25 MED ORDER — FENTANYL CITRATE (PF) 100 MCG/2ML IJ SOLN
INTRAMUSCULAR | Status: DC | PRN
  Administered 2021-07-25: 50 ug via INTRAVENOUS

## 2021-07-25 MED ORDER — INSULIN ASPART 100 UNIT/ML IJ SOLN
0.0000 [IU] | INTRAMUSCULAR | Status: DC
  Administered 2021-07-25: 5 [IU] via SUBCUTANEOUS
  Administered 2021-07-25: 7 [IU] via SUBCUTANEOUS
  Administered 2021-07-26 (×4): 2 [IU] via SUBCUTANEOUS
  Administered 2021-07-26: 3 [IU] via SUBCUTANEOUS
  Administered 2021-07-27 (×5): 2 [IU] via SUBCUTANEOUS
  Administered 2021-07-27: 3 [IU] via SUBCUTANEOUS
  Administered 2021-07-28: 2 [IU] via SUBCUTANEOUS
  Administered 2021-07-28: 1 [IU] via SUBCUTANEOUS
  Administered 2021-07-28 (×4): 2 [IU] via SUBCUTANEOUS

## 2021-07-25 MED ORDER — NOREPINEPHRINE 4 MG/250ML-% IV SOLN
0.0000 ug/min | INTRAVENOUS | Status: DC
  Administered 2021-07-25: 5 ug/min via INTRAVENOUS
  Administered 2021-07-26: 1 ug/min via INTRAVENOUS
  Filled 2021-07-25: qty 250

## 2021-07-25 MED ORDER — PANTOPRAZOLE SODIUM 40 MG PO TBEC: 40.0000 mg | DELAYED_RELEASE_TABLET | Freq: Every day | ORAL | Status: DC

## 2021-07-25 MED ORDER — SUCCINYLCHOLINE CHLORIDE 20 MG/ML IJ SOLN
INTRAMUSCULAR | Status: DC | PRN
  Administered 2021-07-25: 70 mg via INTRAVENOUS

## 2021-07-25 MED ORDER — FENTANYL CITRATE PF 50 MCG/ML IJ SOSY
50.0000 ug | PREFILLED_SYRINGE | Freq: Once | INTRAMUSCULAR | Status: AC
  Administered 2021-07-25: 50 ug via INTRAVENOUS

## 2021-07-25 MED ORDER — PERFLUTREN LIPID MICROSPHERE
1.0000 mL | INTRAVENOUS | Status: AC | PRN
  Administered 2021-07-25: 2 mL via INTRAVENOUS

## 2021-07-25 MED ORDER — PANTOPRAZOLE 2 MG/ML SUSPENSION
40.0000 mg | Freq: Every day | ORAL | Status: DC
  Administered 2021-07-25: 40 mg
  Filled 2021-07-25: qty 20

## 2021-07-25 MED ORDER — CEFTRIAXONE SODIUM 2 G IJ SOLR
2.0000 g | INTRAMUSCULAR | Status: DC
  Administered 2021-07-25 – 2021-07-26 (×2): 2 g via INTRAVENOUS
  Filled 2021-07-25 (×3): qty 20

## 2021-07-25 MED ORDER — PROPOFOL 1000 MG/100ML IV EMUL
0.0000 ug/kg/min | INTRAVENOUS | Status: DC
  Administered 2021-07-25: 20 ug/kg/min via INTRAVENOUS
  Administered 2021-07-26: 30 ug/kg/min via INTRAVENOUS
  Filled 2021-07-25 (×2): qty 100

## 2021-07-25 MED ORDER — FENTANYL CITRATE PF 50 MCG/ML IJ SOSY
25.0000 ug | PREFILLED_SYRINGE | INTRAMUSCULAR | Status: DC | PRN
  Administered 2021-07-25: 100 ug via INTRAVENOUS

## 2021-07-25 MED ORDER — ONDANSETRON HCL 4 MG/2ML IJ SOLN
4.0000 mg | Freq: Four times a day (QID) | INTRAMUSCULAR | Status: DC | PRN
  Administered 2021-07-28: 4 mg via INTRAVENOUS
  Filled 2021-07-25: qty 2

## 2021-07-25 NOTE — H&P (Signed)
NAME:  Yolanda Saunders, MRN:  [id number], DOB:  06/09/1945, LOS: 0 ADMISSION DATE:  07/25/2021, CONSULTATION DATE: 7/623 REFERRING MD: Shannan Harper, CHIEF COMPLAINT: Respiratory failure  History of Present Illness:  Yolanda Saunders is a 76 year old woman with a history of HFrEF, stroke, type 2 diabetes who presented after waking up this morning with sudden onset shortness of breath.  She had been in her usual state of health yesterday, feeling very well.  She had minimal coughing last night, but otherwise her family reports that she had no recent symptoms other than ankle swelling.  She had been taking extra Lasix to address the ankle swelling.  She was started on gabapentin about a week ago with some dizziness since this started.  Due to her shortness of breath this morning her family called EMS.  When they arrived she was saturating in the 50s with a respiratory rate around 50.  She was placed on CPAP for transport to the emergency department.  With EMS she had reduced loss of consciousness that progressed to a 45-second PEA cardiac arrest.  With bag-valve-mask and chest compressions ROSC was achieved prior to administration of ACLS medications.  She was awake in the emergency department and initially placed on BiPAP.  She had an episode of agitation and vomiting into the BiPAP mask at which time she was emergently intubated.  She is on propofol for sedation and now requiring norepinephrine for hypotension.  Her family was able to provide additional history.  She recently has updated her advance directives, which they will bring a copy of to the hospital.  She reports her PCP at [facility] has a copy as well.  Family said she is DNR and she would only want aggressive healthcare measures such as intubation for 3 days max.  After that she would want to be kept comfortable.  Although family would use differently, they are very respectful of her wishes.  They admit that she prioritizes her quality of life.  Her  husband passed away in the past year, which has been very hard for her.  Pertinent  Medical History  HfpEF Fibromyalgia Type 2 diabetes GERD Stroke   Significant Hospital Events: Including procedures, antibiotic start and stop dates in addition to other pertinent events   7/6 admitted, intubated  Interim History / Subjective:    Objective   Blood pressure 95/60, pulse (!) 58, resp. rate 16, height _0  (1.6 m), weight 67.7 kg, SpO2 98 %.    Vent Mode: PRVC FiO2 (%):  [100 %] 100 % Set Rate:  [18 bmp] 18 bmp Vt Set:  [410 mL] 410 mL PEEP:  [5 cmH20] 5 cmH20 Plateau Pressure:  [28 cmH20] 28 cmH20  No intake or output data in the 24 hours ending 07/25/21 1403 Filed Weights   07/25/21 1217  Weight: 67.7 kg    Examination: General: Critically ill-appearing woman lying in bed intubated, sedated HENT: [place]/AT, eyes anicteric, endotracheal tube in place. Lungs: Coarse rales bilaterally, no wheezing.  Mild vent dyssynchrony.  Thin frothy secretions from endotracheal tube. Cardiovascular: S1-S2, regular rate and rhythm Abdomen: Soft, nontender, hypoactive bowel sounds Extremities: Pitting edema in the ankles Neuro: RASS -4, trying to localize to her endotracheal tube, sluggishly reactive pupils, intact corneal reflex and cough reflex Derm: Warm, dry, no diffuse rashes  ABG obtained upon ED arrival: 7.0/103/197/26  XR personally reviewed-bilateral batwing opacities Ionized calcium 0.8 BUN 36 Creatinine 1.7 AST 68 ALT 68 Bilirubin 1.7 WBC 13.9 H/H 11.8/40 Platelets 192 Glucose 300s  EKG-normal sinus rhythm, normal axis.  PVCs.  Prolonged QRS.  T wave inversions in V6 only without ST segment changes.   Resolved Hospital Problem list     Assessment & Plan:  Acute respiratory failure with hypoxia--chest x-ray findings concerning for pulmonary edema.  With sudden onset worry about hypertensive emergency versus ACS.  Sudden onset shortness of breath could also be explained  by acute pulmonary embolism.  At risk for aspiration pneumonia versus pneumonitis with vomiting episodes today. - LTV V - VAP prevention protocol - Pad protocol for sedation - Daily SAT and SBT as appropriate - Bronchodilators - Lasix - Empiric antibiotics, respiratory culture - Family said she would have been strict about a time-limited trial of only 3 days on mechanical ventilation.  If not extubated by Sunday she would want to be. -Check D-dimer and leg ultrasounds - Empiric heparin - Serial troponin levels  Sedation related hypotension - Norepinephrine as required to maintain MAP greater than 65  Acute on chronic HFrEF history of ischemic cardiomyopathy Monitor on telemetry - Diuresis - Serial troponin  Hyperglycemia, history of diabetes - Sliding scale insulin as needed - Check A1c  Anemia - Monitor - Transfuse for hemoglobin less than 7 or hemodynamically significant bleeding  Leukocytosis, likely reactive - Monitor  Hypocalcemia -repleted  GoC -Multiple family updated in the ED, POA present. POA will bring paperwork tomorrow. She recently changed her code status to DNR and would not want prolonged time on aggressive life support- no more than about 3 days. Family present is unanimous about this, though tearful.    Best Practice (right click and "Reselect all SmartList Selections" daily)   Diet/type: tubefeeds DVT prophylaxis: systemic heparin GI prophylaxis: PPI Lines: N/A Foley:  N/A Code Status:  DNR Last date of multidisciplinary goals of care discussion [family updated in the ED 7/6]  Labs   CBC: Recent Labs  Lab 07/25/21 1209 07/25/21 1228  WBC 13.9*  --   NEUTROABS PENDING  --   HGB 11.8* 13.3  12.9  HCT 40.0 39.0  38.0  MCV 104.7*  --   PLT 192  --     Basic Metabolic Panel: Recent Labs  Lab 07/25/21 1209 07/25/21 1228  NA 144 146*  144  K 4.2 4.1  4.0  CL 106 103  CO2 23  --   GLUCOSE 318* 322*  BUN 36* 38*  CREATININE 1.70*  1.40*  CALCIUM 9.0  --    GFR: Estimated Creatinine Clearance: 31.1 mL/min (A) (by C-G formula based on SCr of 1.4 mg/dL (H)). Recent Labs  Lab 07/25/21 1209  WBC 13.9*    Liver Function Tests: Recent Labs  Lab 07/25/21 1209  AST 68*  ALT 68*  ALKPHOS 65  BILITOT 0.7  PROT 6.2*  ALBUMIN 3.6   No results for input(s): "LIPASE", "AMYLASE" in the last 168 hours. No results for input(s): "AMMONIA" in the last 168 hours.  ABG    Component Value Date/Time   PHART 7.337 (L) 08/07/2020 1047   PCO2ART 43.4 08/07/2020 1047   PO2ART 211 (H) 08/07/2020 1047   HCO3 25.6 07/25/2021 1228   TCO2 29 07/25/2021 1228   TCO2 27 07/25/2021 1228   ACIDBASEDEF 8.0 (H) 07/25/2021 1228   O2SAT 99 07/25/2021 1228     Coagulation Profile: No results for input(s): "INR", "PROTIME" in the last 168 hours.  Cardiac Enzymes: No results for input(s): "CKTOTAL", "CKMB", "CKMBINDEX", "TROPONINI" in the last 168 hours.  HbA1C: Hgb A1c MFr Bld  Date/Time Value Ref Range Status  08/03/2020 01:19 AM 7.2 (H) 4.8 - 5.6 % Final    Comment:    (NOTE) Pre diabetes:          5.7%-6.4%  Diabetes:              >6.4%  Glycemic control for   <7.0% adults with diabetes   05/30/2020 01:03 PM 8.0 (H) 4.8 - 5.6 % Final    Comment:    (NOTE) Pre diabetes:          5.7%-6.4%  Diabetes:              >6.4%  Glycemic control for   <7.0% adults with diabetes     CBG: Recent Labs  Lab 07/25/21 1200  GLUCAP 307*    Review of Systems:   Unable to be obtained due to intubated, sedated   Past Medical History:  She,  has a past medical history of Anemia (04/17/11), Angina (04/16/11), Anxiety, Arthritis, Asthma, Carpal tunnel syndrome, Cataracts, bilateral, Chronic combined systolic and diastolic heart failure ([place]) (08/07/2020), CKD (chronic kidney disease), stage III ([place]), Complication of anesthesia (1987), CVA (cerebral infarction), Depression, Edema, Fibromyalgia, GERD (gastroesophageal reflux  disease), Headache(784.0), High cholesterol, History of bronchitis, Hypertension, IBS (irritable bowel syndrome), Migraines, Panic attacks (04/17/11), Pneumonia (04/17/11), Renal disorder (04/17/11), Shortness of breath on exertion, Stroke ([place]) (2002), and Type II diabetes mellitus ([place]).   Surgical History:   Past Surgical History:  Procedure Laterality Date   CARDIAC CATHETERIZATION  2002   CARPAL TUNNEL RELEASE  2003-2010   "twice on left; once on right"   [place]   EYE SURGERY Bilateral 11/2019   Lenses Implant   histerectomy     LUMBAR LAMINECTOMY/ DECOMPRESSION WITH MET-RX Right 06/01/2020   Procedure: Right Lumbar Four-Five Minimally invasive discectomy;  Surgeon: Judith Part, MD;  Location: [place];  Service: Neurosurgery;  Laterality: Right;   RIGHT/LEFT HEART CATH AND CORONARY ANGIOGRAPHY N/A 08/07/2020   Procedure: RIGHT/LEFT HEART CATH AND CORONARY ANGIOGRAPHY;  Surgeon: Belva Crome, MD;  Location: [place] [facility];  Service: Cardiovascular;  Laterality: N/A;   VAGINAL HYSTERECTOMY  1977     Social History:   reports that she quit smoking about 41 years ago. Her smoking use included cigarettes. She has a 4.50 pack-year smoking history. She has never used smokeless tobacco. She reports that she does not drink alcohol and does not use drugs.   Family History:  Her family history includes Aortic aneurysm in her son; CVA in her mother; Coronary artery disease in her father; Diabetes Mellitus I in her brother, brother, father, maternal grandfather, maternal grandmother, mother, paternal grandfather, paternal grandmother, and sister; Stroke in her mother.   Allergies Allergies  Allergen Reactions   Codeine Other (See Comments)    "makes me crazy; see things; delusional"   Erythromycin Other (See Comments)    "peeled skin; like a sunburn & I turn the color of the pill; a kind of purple-look"   Lisinopril Cough    "cause I  have asthma"   Penicillins Rash and Other (See Comments)    "puffy blisters   Shellfish Allergy     Throat swells   Sulfonamide Derivatives Hives    "watery hives"   Metoprolol Diarrhea and Nausea And Vomiting    Rapid weight gain.   Actos [Pioglitazone]     Upset GI   Amlodipine     Syncope  Benicar [Olmesartan] Other (See Comments)    Dizziness and HA   Byetta 10 Mcg Pen [Exenatide] Nausea And Vomiting    5 MCG pen   Clonidine Hcl Other (See Comments)   Cyclobenzaprine Other (See Comments)   Gabapentin Other (See Comments)    Anxiety    Glimepiride     Upset GI   Glipizide     Upset GI   Glyburide-Metformin     Myalgias   Humalog [Insulin Lispro]     Headache, SEVERE HYPERGLYCEMIA   Hydralazine Hcl Other (See Comments)   Invokana [Canagliflozin]     Yeast infections   Januvia [Sitagliptin]     uti   Other     Other reaction(s): Unknown   Prednisone Other (See Comments)   Reglan [Metoclopramide]     unknown   Septra [Sulfamethoxazole-Trimethoprim] Hives   Spironolactone     unk   Tramadol Other (See Comments)    anxiety   Victoza [Liraglutide] Diarrhea    Abdominal pain    Zocor [Simvastatin]     unknown   Losartan Potassium Rash    Upset GI   Novolin R [Insulin] Swelling and Rash    70/30     Home Medications  Prior to Admission medications   Medication Sig Start Date End Date Taking? Authorizing Provider  acetaminophen (TYLENOL) 325 MG tablet Take 2 tablets (650 mg total) by mouth every 6 (six) hours as needed for mild pain (or temp > 37.5 C (99.5 F)). 08/08/20   Elgergawy, Silver Huguenin, MD  apixaban (ELIQUIS) 5 MG TABS tablet Take 1 tablet by mouth twice daily 05/09/21   Loel Dubonnet, NP  atorvastatin (LIPITOR) 80 MG tablet Take 1 tablet (80 mg total) by mouth daily. 08/09/20   Elgergawy, Silver Huguenin, MD  Blood Glucose Monitoring Suppl (ONETOUCH VERIO) w/Device KIT 1 each by Does not apply route daily. 11/14/15   Philemon Kingdom, MD  carvedilol (COREG)  3.125 MG tablet Take 0.5 tablets (1.56 mg total) by mouth 2 (two) times daily with a meal. 1/2 tab twice daily 09/07/20   Loel Dubonnet, NP  Cholecalciferol (VITAMIN D3) 125 MCG (5000 UT) TABS Take 1 tablet (5,000 Units total) by mouth once a week. 08/08/20   Elgergawy, Silver Huguenin, MD  diazepam (VALIUM) 5 MG tablet Take one tablet 30 minutes prior to cardiac MRI. Take second tablet if needed. 09/12/20   Loel Dubonnet, NP  furosemide (LASIX) 40 MG tablet Take 0.5 tablets (20 mg total) by mouth daily. 08/08/20 03/18/21  Elgergawy, Silver Huguenin, MD  glucose blood (ONETOUCH VERIO) test strip USE 1 STRIP TO CHECK GLUCOSE 4x TIMES DAILY Patient taking differently: 1 each by Other route See admin instructions. USE 1 STRIP TO CHECK GLUCOSE 4x TIMES DAILY 05/22/20   Philemon Kingdom, MD  insulin glargine (LANTUS) 100 UNIT/ML injection Inject 0.1 mLs (10 Units total) into the skin daily. 08/08/20   Elgergawy, Silver Huguenin, MD  Insulin Syringe-Needle U-100 (RELION INSULIN SYR 0.5ML/31G) 31G X 5/16" 0.5 ML MISC USE 1  THREE TIMES DAILY 11/28/20   Philemon Kingdom, MD  irbesartan (AVAPRO) 75 MG tablet Take 37.5 mg by mouth daily. 12/26/20   [provider]  nitrofurantoin, macrocrystal-monohydrate, (MACROBID) 100 MG capsule Take 100 mg by mouth every 12 (twelve) hours. 01/04/21   [provider]  pantoprazole (PROTONIX) 40 MG tablet Take 1 tablet (40 mg total) by mouth daily. 08/09/20   Elgergawy, Silver Huguenin, MD  potassium chloride SA (KLOR-CON) 20 MEQ  tablet Take 20 meq daily for the next 4 days only 11/16/20   Loel Dubonnet, NP  sertraline (ZOLOFT) 25 MG tablet Take 12.5-25 mg by mouth daily. 11/04/20   [provider]  Syringe/Needle, Disp, (SYRINGE LUER LOCK) 23G X 1" 3 ML MISC 1 Package by Does not apply route as needed. Patient taking differently: 1 Package by Does not apply route daily. 07/21/14   Narda Amber K, DO  vitamin B-12 (CYANOCOBALAMIN) 1000 MCG tablet Take 1,000 mcg by mouth  daily.    [provider]     Critical care time: 50 min.       Julian Hy, DO 07/25/21 2:03 PM  Pulmonary & Critical Care

## 2021-07-25 NOTE — Progress Notes (Addendum)
Patients niece Cathlyn Parsons taking rings home 2 gold colored rings with Clear stones and 1 gold colored band. Also notified niece Alroy Dust that Lorene Dy will have patient's rings.

## 2021-07-25 NOTE — Progress Notes (Signed)
  Echocardiogram 2D Echocardiogram has been performed.  Augustine Radar 07/25/2021, 3:37 PM

## 2021-07-25 NOTE — ED Notes (Signed)
Called by other nurse in to Tra [facility] to assist. Pt suddenly began vomiting into BiPAP and being incontinent of stool. Pt is slumped over rail, tachypneic and moaning. Minimally following commands. BiPAP immediately removed by RN, pt suctioned and Assisted to sitting upright. MD Rhunette Croft made immediately aware

## 2021-07-25 NOTE — ED Triage Notes (Signed)
Pt BIB [facility] from home for eval of SOB and cardiac arrest. EMS reports pt initially called out for sudden onset SOB about 1 hour prior to calling. On EMS arrival, pt was in severe respiratory distress w/ sats in the 60s. EMS reports they attempted CPAP en route and were able to successfully apply it. About 2 minutes after CPAP application, pt reportedly went entire unresponsive and EMS reports a brief PEA arrest. EMS reports about 30-45 seconds of CPR, no ACLS drugs admin. EMS reports they began bagging pt en route. Pt arrives to ED GCS 8-9, currently being bagged. Minimally responsive. Pt placed on BiPAP on arrival to ED to assess how she will tolerate

## 2021-07-25 NOTE — Progress Notes (Signed)
Patient transported on ventilator to 2H26 with no complications. Vitals stable. Report given to receiving RT.

## 2021-07-25 NOTE — Plan of Care (Signed)
  Problem: Education: Goal: Ability to describe self-care measures that may prevent or decrease complications (Diabetes Survival Skills Education) will improve Outcome: Progressing Goal: Individualized Educational Video(s) Outcome: Progressing   Problem: Coping: Goal: Ability to adjust to condition or change in health will improve Outcome: Progressing   Problem: Fluid Volume: Goal: Ability to maintain a balanced intake and output will improve Outcome: Progressing   Problem: Health Behavior/Discharge Planning: Goal: Ability to identify and utilize available resources and services will improve Outcome: Progressing Goal: Ability to manage health-related needs will improve Outcome: Progressing   Problem: Metabolic: Goal: Ability to maintain appropriate glucose levels will improve Outcome: Progressing   Problem: Nutritional: Goal: Maintenance of adequate nutrition will improve Outcome: Progressing Goal: Progress toward achieving an optimal weight will improve Outcome: Progressing   Problem: Skin Integrity: Goal: Risk for impaired skin integrity will decrease Outcome: Progressing   Problem: Tissue Perfusion: Goal: Adequacy of tissue perfusion will improve Outcome: Progressing   Problem: Safety: Goal: Non-violent Restraint(s) Outcome: Progressing   Problem: Education: Goal: Knowledge of General Education information will improve Description: Including pain rating scale, medication(s)/side effects and non-pharmacologic comfort measures Outcome: Progressing   Problem: Health Behavior/Discharge Planning: Goal: Ability to manage health-related needs will improve Outcome: Progressing   Problem: Clinical Measurements: Goal: Ability to maintain clinical measurements within normal limits will improve Outcome: Progressing Goal: Will remain free from infection Outcome: Progressing Goal: Diagnostic test results will improve Outcome: Progressing Goal: Respiratory complications  will improve Outcome: Progressing Goal: Cardiovascular complication will be avoided Outcome: Progressing   Problem: Activity: Goal: Risk for activity intolerance will decrease Outcome: Progressing   Problem: Nutrition: Goal: Adequate nutrition will be maintained Outcome: Progressing   Problem: Coping: Goal: Level of anxiety will decrease Outcome: Progressing   Problem: Elimination: Goal: Will not experience complications related to bowel motility Outcome: Progressing Goal: Will not experience complications related to urinary retention Outcome: Progressing   Problem: Pain Managment: Goal: General experience of comfort will improve Outcome: Progressing   Problem: Safety: Goal: Ability to remain free from injury will improve Outcome: Progressing   Problem: Skin Integrity: Goal: Risk for impaired skin integrity will decrease Outcome: Progressing   

## 2021-07-25 NOTE — Progress Notes (Signed)
ANTICOAGULATION CONSULT NOTE - Initial Consult  Pharmacy Consult for heparin Indication: pulmonary embolus  Allergies  Allergen Reactions   Codeine Other (See Comments)    "makes me crazy; see things; delusional"   Erythromycin Other (See Comments)    "peeled skin; like a sunburn & I turn the color of the pill; a kind of purple-look"   Lisinopril Cough    "cause I have asthma"   Penicillins Rash and Other (See Comments)    "puffy blisters   Shellfish Allergy     Throat swells   Sulfonamide Derivatives Hives    "watery hives"   Metoprolol Diarrhea and Nausea And Vomiting    Rapid weight gain.   Actos [Pioglitazone]     Upset GI   Amlodipine     Syncope   Benicar [Olmesartan] Other (See Comments)    Dizziness and HA   Byetta 10 Mcg Pen [Exenatide] Nausea And Vomiting    5 MCG pen   Clonidine Hcl Other (See Comments)   Cyclobenzaprine Other (See Comments)   Gabapentin Other (See Comments)    Anxiety    Glimepiride     Upset GI   Glipizide     Upset GI   Glyburide-Metformin     Myalgias   Humalog [Insulin Lispro]     Headache, SEVERE HYPERGLYCEMIA   Hydralazine Hcl Other (See Comments)   Invokana [Canagliflozin]     Yeast infections   Januvia [Sitagliptin]     uti   Other     Other reaction(s): Unknown   Prednisone Other (See Comments)   Reglan [Metoclopramide]     unknown   Septra [Sulfamethoxazole-Trimethoprim] Hives   Spironolactone     unk   Tramadol Other (See Comments)    anxiety   Victoza [Liraglutide] Diarrhea    Abdominal pain    Zocor [Simvastatin]     unknown   Losartan Potassium Rash    Upset GI   Novolin R [Insulin] Swelling and Rash    70/30    Patient Measurements: Height: 5\' 3"  (160 cm) Weight: 67.7 kg (149 lb 4 oz) IBW/kg (Calculated) : 52.4 Heparin Dosing Weight: 66kg  Vital Signs: Temp: 96.2 F (35.7 C) (07/06 1500) BP: 114/72 (07/06 1500) Pulse Rate: 56 (07/06 1500)  Labs: Recent Labs    07/25/21 1209 07/25/21 1228  07/25/21 1448  HGB 11.8* 13.3  12.9 11.9*  HCT 40.0 39.0  38.0 35.0*  PLT 192  --   --   CREATININE 1.70* 1.40*  --   TROPONINIHS 68*  --   --     Estimated Creatinine Clearance: 31.1 mL/min (A) (by C-G formula based on SCr of 1.4 mg/dL (H)).   Medical History: Past Medical History:  Diagnosis Date   Anemia 04/17/11   "long, long years ago"   Angina 04/16/11   "that's what I'm here for"   Anxiety    Arthritis    Hips and knees   Asthma    Carpal tunnel syndrome    Cataracts, bilateral    Chronic combined systolic and diastolic heart failure ([facility]) 08/07/2020   CKD (chronic kidney disease), stage III ([facility])    Complication of anesthesia 1987   "affected my eyes; couldn't see anything but blurrs even the next day"   CVA (cerebral infarction)    After cardiac catheter 02/2000   Depression    Edema    Fibromyalgia    GERD (gastroesophageal reflux disease)    Headache(784.0)    High cholesterol  History of bronchitis    Hypertension    IBS (irritable bowel syndrome)    Migraines    Panic attacks 04/17/11   "don't take anything for it"   Pneumonia 04/17/11   "probably as many as 7 times"   Renal disorder 04/17/11   "they are working at 60% capacity"   Shortness of breath on exertion    "cause of my asthma"   Stroke ([facility]) 2002   residual "problem w/using the right word, left 5 lesions on my brain/MRI; long term memory loss"   Type II diabetes mellitus ([facility])    Assessment: 85 YOF presenting with sudden onset SOB, intubated in ED, pharmacy consulted for heparin for empiric PE rule out.  She has hx of afib and is on Eliquis PTA, last taken last night, will follow aPTT until correlated with anti-Xa  Goal of Therapy:  Heparin level 0.3-0.7 units/ml aPTT 66-102 seconds Monitor platelets by anticoagulation protocol: Yes   Plan:  Heparin 2000 units IV x 1, and gtt at 1100 units/hr F/u 8 hour aPTT and heparin level F/u PE workup  Daylene Posey, PharmD Clinical  Pharmacist ED Pharmacist Phone # [phone] 07/25/2021 3:06 PM

## 2021-07-25 NOTE — Progress Notes (Signed)
Bilateral lower extremity venous duplex has been completed. Preliminary results can be found in CV Proc through chart review.  Results were given to [facility] Gleason PA.  07/25/21 3:45 PM Olen Cordial RVT

## 2021-07-25 NOTE — ED Provider Notes (Signed)
[facility] EMERGENCY DEPARTMENT Provider Note   CSN: [id number] Arrival date & time: 07/25/21  1156     History  Chief Complaint  Patient presents with   Shortness of Breath    Yolanda Saunders is a 76 y.o. female.  HPI    76 year old female comes in with chief complaint of shortness of breath and confusion.  Patient has history of stroke, diabetes, hypertension, CHF and cardiomyopathy with a EF of 35%.  She also has CAD that is being medically managed.  Level 5 caveat for unresponsive patient.  Patient brought here by EMS.  They indicate that patient started having sudden onset shortness of breath this morning.  They found patient to be having audible wheezing, respiratory distress and hypoxia.  Patient was placed on BiPAP.  She became apneic, unresponsive and there was 30 seconds of CPR performed by them.  Patient started fighting the CPR and it was discontinued.  Patient arrives to the ER being bagged.  She is responding to stimuli, giving [place] a thumbs up when requested. She is however in respiratory distress and not able to provide any meaningful history.  I spoke with family later in patient's stay.  They indicate that patient was started on gabapentin few weeks back and has been having some weakness since then.  Yesterday they had a normal day.  Patient was tired and went to sleep early.  She woke up this morning at 10:00 and mention to family that she was feeling short of breath and nauseated and she vomited.  She was gasping for air and 911 was called by them.  Patient has had a cough the last few days.  They have also noticed increased leg swelling.  Patient is taking her medications as prescribed.   Home Medications Prior to Admission medications   Medication Sig Start Date End Date Taking? Authorizing Provider  acetaminophen (TYLENOL) 325 MG tablet Take 2 tablets (650 mg total) by mouth every 6 (six) hours as needed for mild pain (or temp > 37.5 C  (99.5 F)). 08/08/20   Elgergawy, Silver Huguenin, MD  apixaban (ELIQUIS) 5 MG TABS tablet Take 1 tablet by mouth twice daily Patient taking differently: Take 5 mg by mouth 2 (two) times daily. 05/09/21   Loel Dubonnet, NP  atorvastatin (LIPITOR) 80 MG tablet Take 1 tablet (80 mg total) by mouth daily. 08/09/20   Elgergawy, Silver Huguenin, MD  Blood Glucose Monitoring Suppl (ONETOUCH VERIO) w/Device KIT 1 each by Does not apply route daily. 11/14/15   Philemon Kingdom, MD  carvedilol (COREG) 3.125 MG tablet Take 0.5 tablets (1.56 mg total) by mouth 2 (two) times daily with a meal. 1/2 tab twice daily 09/07/20   Loel Dubonnet, NP  Cholecalciferol (VITAMIN D3) 125 MCG (5000 UT) TABS Take 1 tablet (5,000 Units total) by mouth once a week. 08/08/20   Elgergawy, Silver Huguenin, MD  diazepam (VALIUM) 5 MG tablet Take one tablet 30 minutes prior to cardiac MRI. Take second tablet if needed. 09/12/20   Loel Dubonnet, NP  furosemide (LASIX) 40 MG tablet Take 0.5 tablets (20 mg total) by mouth daily. 08/08/20 03/18/21  Elgergawy, Silver Huguenin, MD  glucose blood (ONETOUCH VERIO) test strip USE 1 STRIP TO CHECK GLUCOSE 4x TIMES DAILY Patient taking differently: 1 each by Other route See admin instructions. USE 1 STRIP TO CHECK GLUCOSE 4x TIMES DAILY 05/22/20   Philemon Kingdom, MD  insulin glargine (LANTUS) 100 UNIT/ML injection Inject 0.1 mLs (10  Units total) into the skin daily. 08/08/20   Elgergawy, Silver Huguenin, MD  Insulin Syringe-Needle U-100 (RELION INSULIN SYR 0.5ML/31G) 31G X 5/16" 0.5 ML MISC USE 1  THREE TIMES DAILY 11/28/20   Philemon Kingdom, MD  irbesartan (AVAPRO) 75 MG tablet Take 37.5 mg by mouth daily. 12/26/20   [provider]  nitrofurantoin, macrocrystal-monohydrate, (MACROBID) 100 MG capsule Take 100 mg by mouth every 12 (twelve) hours. 01/04/21   [provider]  pantoprazole (PROTONIX) 40 MG tablet Take 1 tablet (40 mg total) by mouth daily. 08/09/20   Elgergawy, Silver Huguenin, MD  potassium chloride  SA (KLOR-CON) 20 MEQ tablet Take 20 meq daily for the next 4 days only Patient taking differently: Take 20 mEq by mouth daily. 11/16/20   Loel Dubonnet, NP  sertraline (ZOLOFT) 25 MG tablet Take 12.5-25 mg by mouth daily. 11/04/20   [provider]  Syringe/Needle, Disp, (SYRINGE LUER LOCK) 23G X 1" 3 ML MISC 1 Package by Does not apply route as needed. Patient taking differently: 1 Package by Does not apply route daily. 07/21/14   Narda Amber K, DO  vitamin B-12 (CYANOCOBALAMIN) 1000 MCG tablet Take 1,000 mcg by mouth daily.    [provider]      Allergies    Codeine, Erythromycin, Lisinopril, Penicillins, Shellfish allergy, Sulfonamide derivatives, Metoprolol, Actos [pioglitazone], Amlodipine, Benicar [olmesartan], Byetta 10 mcg pen [exenatide], Clonidine hcl, Cyclobenzaprine, Gabapentin, Glimepiride, Glipizide, Glyburide-metformin, Humalog [insulin lispro], Hydralazine hcl, Invokana [canagliflozin], Januvia [sitagliptin], Other, Prednisone, Reglan [metoclopramide], Septra [sulfamethoxazole-trimethoprim], Spironolactone, Tramadol, Victoza [liraglutide], Zocor [simvastatin], Losartan potassium, and Novolin r [insulin]    Review of Systems   Review of Systems  Unable to perform ROS: Acuity of condition    Physical Exam Updated Vital Signs BP 104/88   Pulse (!) 58   Temp (!) 96 F (35.6 C)   Resp (!) 27   Ht 5' 3" (1.6 m)   Wt 67.7 kg   SpO2 96%   BMI 26.44 kg/m  Physical Exam Vitals and nursing note reviewed.  Constitutional:      Appearance: She is well-developed.  HENT:     Head: Atraumatic.  Neck:     Vascular: No JVD.  Cardiovascular:     Rate and Rhythm: Normal rate.  Pulmonary:     Effort: Pulmonary effort is normal.     Breath sounds: Examination of the right-upper field reveals rhonchi and rales. Examination of the left-upper field reveals rhonchi and rales. Examination of the right-middle field reveals rhonchi and rales. Examination of the  left-middle field reveals rhonchi and rales. Examination of the right-lower field reveals rhonchi and rales. Examination of the left-lower field reveals rhonchi and rales. Rhonchi and rales present.  Musculoskeletal:     Cervical back: Normal range of motion and neck supple.     Right lower leg: Edema present.     Left lower leg: Edema present.  Skin:    General: Skin is warm and dry.  Neurological:     Mental Status: She is disoriented.     ED Results / Procedures / Treatments   Labs (all labs ordered are listed, but only abnormal results are displayed) Labs Reviewed  COMPREHENSIVE METABOLIC PANEL - Abnormal; Notable for the following components:      Result Value   Glucose, Bld 318 (*)    BUN 36 (*)    Creatinine, Ser 1.70 (*)    Total Protein 6.2 (*)    AST 68 (*)    ALT  68 (*)    GFR, Estimated 31 (*)    All other components within normal limits  CBC WITH DIFFERENTIAL/PLATELET - Abnormal; Notable for the following components:   WBC 13.9 (*)    RBC 3.82 (*)    Hemoglobin 11.8 (*)    MCV 104.7 (*)    MCHC 29.5 (*)    Lymphs Abs 6.8 (*)    All other components within normal limits  BRAIN NATRIURETIC PEPTIDE - Abnormal; Notable for the following components:   B Natriuretic Peptide 450.3 (*)    All other components within normal limits  LACTIC ACID, PLASMA - Abnormal; Notable for the following components:   Lactic Acid, Venous 4.1 (*)    All other components within normal limits  HEMOGLOBIN A1C - Abnormal; Notable for the following components:   Hgb A1c MFr Bld 7.8 (*)    All other components within normal limits  CBG MONITORING, ED - Abnormal; Notable for the following components:   Glucose-Capillary 307 (*)    All other components within normal limits  I-STAT VENOUS BLOOD GAS, ED - Abnormal; Notable for the following components:   pH, Ven 7.004 (*)    pCO2, Ven 102.9 (*)    pO2, Ven 197 (*)    Acid-base deficit 8.0 (*)    Calcium, Ion 0.78 (*)    All other  components within normal limits  I-STAT CHEM 8, ED - Abnormal; Notable for the following components:   Sodium 146 (*)    BUN 38 (*)    Creatinine, Ser 1.40 (*)    Glucose, Bld 322 (*)    Calcium, Ion 0.77 (*)    All other components within normal limits  I-STAT ARTERIAL BLOOD GAS, ED - Abnormal; Notable for the following components:   pH, Arterial 7.258 (*)    pCO2 arterial 64.3 (*)    Bicarbonate 28.7 (*)    Potassium 5.6 (*)    HCT 35.0 (*)    Hemoglobin 11.9 (*)    All other components within normal limits  TROPONIN I (HIGH SENSITIVITY) - Abnormal; Notable for the following components:   Troponin I (High Sensitivity) 68 (*)    All other components within normal limits  TROPONIN I (HIGH SENSITIVITY) - Abnormal; Notable for the following components:   Troponin I (High Sensitivity) 65 (*)    All other components within normal limits  CULTURE, RESPIRATORY W GRAM STAIN  CULTURE, BLOOD (ROUTINE X 2)  CULTURE, BLOOD (ROUTINE X 2)  SARS CORONAVIRUS 2 BY RT PCR  D-DIMER, QUANTITATIVE  MAGNESIUM  LACTIC ACID, PLASMA  HEPARIN LEVEL (UNFRACTIONATED)  APTT    EKG EKG Interpretation  Date/Time:  Thursday July 25 2021 11:59:56 EDT Ventricular Rate:  97 PR Interval:  177 QRS Duration: 140 QT Interval:  388 QTC Calculation: 493 R Axis:   27 Text Interpretation: Sinus tachycardia Multiple ventricular premature complexes IVCD, consider atypical LBBB No acute changes Nonspecific ST and T wave abnormality Confirmed by Varney Biles ([id number]) on 07/25/2021 3:38:24 PM  Radiology DG Chest Port 1 View  Result Date: 07/25/2021 CLINICAL DATA:  Intermittent palpitations and chest tightness the past few days. EXAM: PORTABLE CHEST 1 VIEW COMPARISON:  Chest x-ray from same day. FINDINGS: Interval intubation with the endotracheal tube tip 1.2 cm above the carina. New enteric tube in the stomach. Unchanged mild cardiomegaly. Similar to mildly worsened diffuse interstitial thickening and bilateral  parahilar and bibasilar airspace opacities. No pleural effusion or pneumothorax. No acute osseous abnormality. IMPRESSION: 1. Interval intubation  with endotracheal tube tip 1.2 cm above the carina. Consider retracting 1-2 cm. 2. Similar to mildly worsened moderate pulmonary edema. Electronically Signed   By: Titus Dubin M.D.   On: 07/25/2021 13:09   DG Chest Port 1 View  Result Date: 07/25/2021 CLINICAL DATA:  Shortness of breath EXAM: PORTABLE CHEST 1 VIEW COMPARISON:  Chest x-ray dated December 13, 2020 FINDINGS: The heart size and mediastinal contours are within normal limits. Diffuse interstitial opacities with central predominant airspace opacities. The visualized skeletal structures are unremarkable. IMPRESSION: Diffuse interstitial opacities with central predominant airspace opacities, likely due to pulmonary edema, infection is an additional consideration. Electronically Signed   By: Yetta Glassman M.D.   On: 07/25/2021 12:34    Procedures .Critical Care  Performed by: Varney Biles, MD Authorized by: Varney Biles, MD   Critical care provider statement:    Critical care time (minutes):  79   Critical care was necessary to treat or prevent imminent or life-threatening deterioration of the following conditions:  CNS failure or compromise and respiratory failure   Critical care was time spent personally by me on the following activities:  Development of treatment plan with patient or surrogate, discussions with consultants, evaluation of patient's response to treatment, examination of patient, ordering and review of laboratory studies, ordering and review of radiographic studies, ordering and performing treatments and interventions, pulse oximetry, re-evaluation of patient's condition and review of old charts Procedure Name: Intubation Date/Time: 07/25/2021 3:38 PM  Performed by: Varney Biles, MDPre-anesthesia Checklist: Patient identified, Patient being monitored, Emergency Drugs  available, Timeout performed and Suction available Oxygen Delivery Method: Non-rebreather mask Preoxygenation: Pre-oxygenation with 100% oxygen Induction Type: Rapid sequence Ventilation: Mask ventilation without difficulty Laryngoscope Size: Glidescope and 4 Grade View: Grade I Tube size: 7.5 mm Number of attempts: 1 Placement Confirmation: ETT inserted through vocal cords under direct vision, CO2 detector and Breath sounds checked- equal and bilateral Secured at: 25 cm        Medications Ordered in ED Medications  propofol (DIPRIVAN) 1000 MG/100ML infusion (30 mcg/kg/min  67.7 kg Intravenous Rate/Dose Change 07/25/21 1420)  fentaNYL (SUBLIMAZE) injection 25 mcg (has no administration in time range)  fentaNYL (SUBLIMAZE) injection 25-100 mcg (has no administration in time range)  midazolam (VERSED) injection 1 mg (has no administration in time range)  midazolam (VERSED) injection 1 mg (has no administration in time range)  fentaNYL (SUBLIMAZE) 100 MCG/2ML injection (  Canceled Entry 07/25/21 1403)  fentaNYL (SUBLIMAZE) 100 MCG/2ML injection (  Canceled Entry 07/25/21 1405)  norepinephrine (LEVOPHED) 25m in 2566m(0.016 mg/mL) premix infusion (7.5 mcg/min Intravenous Rate/Dose Change 07/25/21 1415)  calcium gluconate 1 g/ 50 mL sodium chloride IVPB (1,000 mg Intravenous New Bag/Given 07/25/21 1518)  etomidate (AMIDATE) injection (20 mg Intravenous Given 07/25/21 1240)  succinylcholine (ANECTINE) injection (70 mg Intravenous Given 07/25/21 1241)  polyethylene glycol (MIRALAX / GLYCOLAX) packet 17 g (has no administration in time range)  pantoprazole sodium (PROTONIX) 40 mg/20 mL oral suspension 40 mg (40 mg Per Tube Given 07/25/21 1519)  ondansetron (ZOFRAN) injection 4 mg (has no administration in time range)  fentaNYL (SUBLIMAZE) injection (50 mcg Intravenous Given 07/25/21 1320)  insulin aspart (novoLOG) injection 0-9 Units (has no administration in time range)  docusate (COLACE) 50 MG/5ML liquid  100 mg (has no administration in time range)  cefTRIAXone (ROCEPHIN) 2 g in sodium chloride 0.9 % 100 mL IVPB (2 g Intravenous New Bag/Given 07/25/21 1518)  heparin bolus via infusion 2,000 Units (has no administration in  time range)  heparin ADULT infusion 100 units/mL (25000 units/286m) (has no administration in time range)  perflutren lipid microspheres (DEFINITY) IV suspension (2 mLs Intravenous Given 07/25/21 1538)  fentaNYL (SUBLIMAZE) injection 50 mcg (50 mcg Intravenous Given 07/25/21 1250)  furosemide (LASIX) injection 40 mg (40 mg Intravenous Given 07/25/21 1518)    ED Course/ Medical Decision Making/ A&P                           Medical Decision Making Amount and/or Complexity of Data Reviewed Labs: ordered. Radiology: ordered.  Risk Prescription drug management. Decision regarding hospitalization.   This patient presents to the ED with chief complaint(s) of shortness of breath with pertinent past medical history of congestive heart failure, as well, medically managed CAD, stroke which further complicates the presenting complaint. The complaint involves an extensive differential diagnosis and also carries with it a high risk of complications and morbidity.    Patient noted to be in hypoxic respiratory failure.  She is disoriented, not able to provide meaningful history, but able to follow simple commands.  She is noted to have rales in all lung fields and pitting edema  The differential diagnosis includes : Acute CHF exacerbation, pulmonary edema, PE, acute coronary syndrome, pneumonia, large pleural effusion, pneumothorax  On initial assessment, it is appearing to [place] patient is having flash pulmonary edema.  It also does not appear that she is getting the deepest of breaths.  Concerns are that she might be having hypercapnic respiratory failure leading to disorientation because of poor ventilation.  For now though, if the underlying causes pulmonary edema, we will try and  avoid intubation and start CPAP.  VBG sent upfront.  ABG will be completed by respiratory therapy in 30 minutes after BiPAP.   Treatment and Reassessment: I went to reassess the patient and see if she is improving.  Initially she was becoming more responsive with BiPAP.  As I was walking in, the nursing staff informed me that patient just vomited.  At this time, patient is still disoriented.  We will proceed with intubation. While in the room, I was also given patient's VBG results.  She has hypercapnic acute respiratory failure with PCO2 over 100.  Additional history obtained: Additional history obtained from family and EMS  Records reviewed previous admission documents and previous cardiology visit and also echocardiogram and cath report  Independent labs interpretation:  The following labs were independently interpreted: PCO2 over 100, pH of 7.04  Independent visualization of imaging: - I independently visualized the following imaging with scope of interpretation limited to determining acute life threatening conditions related to emergency care: X-ray of the chest, which revealed pulmonary edema  Treatment and Reassessment: Patient was intubated.  Family indicated that patient would not want CPR.  She would want intubation only if she can survive the insult. At this time, I suspect that she might rebound from her acute hypercapnic respiratory failure.  We proceeded to intubate without any difficulty.  Critical care has been consulted. IV Lasix ordered.  My suspicion is low for pneumonia based on history provided by family.  However, patient did aspirate here in the ER.  She also had vomiting at home and then some CPR like event.  Critical care service indicated that they will assess the patient and decide to start antibiotics after their assessment.   Consultation: - Consulted or discussed management/test interpretation w/ external professional: Critical care service   Final Clinical  Impression(s) / ED Diagnoses Final diagnoses:  Acute hypercapnic respiratory failure ([place])  Acute hypoxemic respiratory failure ([facility])  Acute pulmonary edema ([place])    Rx / DC Orders ED Discharge Orders     None         Varney Biles, MD 07/25/21 1545

## 2021-07-26 DIAGNOSIS — J9601 Acute respiratory failure with hypoxia: Secondary | ICD-10-CM | POA: Diagnosis not present

## 2021-07-26 LAB — POCT I-STAT 7, (LYTES, BLD GAS, ICA,H+H)
Acid-Base Excess: 5 mmol/L — ABNORMAL HIGH (ref 0.0–2.0)
Acid-Base Excess: 8 mmol/L — ABNORMAL HIGH (ref 0.0–2.0)
Acid-Base Excess: 7 mmol/L — ABNORMAL HIGH (ref 0.0–2.0)
Bicarbonate: 29.2 mmol/L — ABNORMAL HIGH (ref 20.0–28.0)
Bicarbonate: 30.5 mmol/L — ABNORMAL HIGH (ref 20.0–28.0)
Bicarbonate: 29.5 mmol/L — ABNORMAL HIGH (ref 20.0–28.0)
Calcium, Ion: 1.22 mmol/L (ref 1.15–1.40)
Calcium, Ion: 1.22 mmol/L (ref 1.15–1.40)
Calcium, Ion: 1.24 mmol/L (ref 1.15–1.40)
HCT: 34 % — ABNORMAL LOW (ref 36.0–46.0)
HCT: 30 % — ABNORMAL LOW (ref 36.0–46.0)
HCT: 30 % — ABNORMAL LOW (ref 36.0–46.0)
Hemoglobin: 11.6 g/dL — ABNORMAL LOW (ref 12.0–15.0)
Hemoglobin: 10.2 g/dL — ABNORMAL LOW (ref 12.0–15.0)
Hemoglobin: 10.2 g/dL — ABNORMAL LOW (ref 12.0–15.0)
O2 Saturation: 97 %
O2 Saturation: 99 %
O2 Saturation: 99 %
Patient temperature: 102
Patient temperature: 38
Patient temperature: 38
Potassium: 3.7 mmol/L (ref 3.5–5.1)
Potassium: 3.3 mmol/L — ABNORMAL LOW (ref 3.5–5.1)
Potassium: 3.2 mmol/L — ABNORMAL LOW (ref 3.5–5.1)
Sodium: 140 mmol/L (ref 135–145)
Sodium: 141 mmol/L (ref 135–145)
Sodium: 142 mmol/L (ref 135–145)
TCO2: 30 mmol/L (ref 22–32)
TCO2: 32 mmol/L (ref 22–32)
TCO2: 31 mmol/L (ref 22–32)
pCO2 arterial: 44.2 mmHg (ref 32–48)
pCO2 arterial: 34.8 mmHg (ref 32–48)
pCO2 arterial: 36.2 mmHg (ref 32–48)
pH, Arterial: 7.435 (ref 7.35–7.45)
pH, Arterial: 7.554 — ABNORMAL HIGH (ref 7.35–7.45)
pH, Arterial: 7.523 — ABNORMAL HIGH (ref 7.35–7.45)
pO2, Arterial: 100 mmHg (ref 83–108)
pO2, Arterial: 146 mmHg — ABNORMAL HIGH (ref 83–108)
pO2, Arterial: 132 mmHg — ABNORMAL HIGH (ref 83–108)

## 2021-07-26 LAB — CBC
HCT: 38.1 % (ref 36.0–46.0)
Hemoglobin: 12 g/dL (ref 12.0–15.0)
MCH: 31.1 pg (ref 26.0–34.0)
MCHC: 31.5 g/dL (ref 30.0–36.0)
MCV: 98.7 fL (ref 80.0–100.0)
Platelets: 146 10*3/uL — ABNORMAL LOW (ref 150–400)
RBC: 3.86 MIL/uL — ABNORMAL LOW (ref 3.87–5.11)
RDW: 14 % (ref 11.5–15.5)
WBC: 16.9 10*3/uL — ABNORMAL HIGH (ref 4.0–10.5)
nRBC: 0 % (ref 0.0–0.2)

## 2021-07-26 LAB — BASIC METABOLIC PANEL
Anion gap: 15 (ref 5–15)
BUN: 39 mg/dL — ABNORMAL HIGH (ref 8–23)
CO2: 27 mmol/L (ref 22–32)
Calcium: 9.4 mg/dL (ref 8.9–10.3)
Chloride: 102 mmol/L (ref 98–111)
Creatinine, Ser: 1.59 mg/dL — ABNORMAL HIGH (ref 0.44–1.00)
GFR, Estimated: 33 mL/min — ABNORMAL LOW (ref >60)
Glucose, Bld: 162 mg/dL — ABNORMAL HIGH (ref 70–99)
Potassium: 4.2 mmol/L (ref 3.5–5.1)
Sodium: 144 mmol/L (ref 135–145)

## 2021-07-26 LAB — GLUCOSE, CAPILLARY
Glucose-Capillary: 158 mg/dL — ABNORMAL HIGH (ref 70–99)
Glucose-Capillary: 216 mg/dL — ABNORMAL HIGH (ref 70–99)
Glucose-Capillary: 173 mg/dL — ABNORMAL HIGH (ref 70–99)
Glucose-Capillary: 175 mg/dL — ABNORMAL HIGH (ref 70–99)
Glucose-Capillary: 181 mg/dL — ABNORMAL HIGH (ref 70–99)

## 2021-07-26 LAB — TRIGLYCERIDES: Triglycerides: 39 mg/dL (ref <150)

## 2021-07-26 LAB — APTT: aPTT: 184 seconds (ref 24–36)

## 2021-07-26 LAB — MRSA NEXT GEN BY PCR, NASAL: MRSA by PCR Next Gen: NOT DETECTED

## 2021-07-26 LAB — HEPARIN LEVEL (UNFRACTIONATED): Heparin Unfractionated: >1.1 IU/mL — ABNORMAL HIGH (ref 0.30–0.70)

## 2021-07-26 LAB — MAGNESIUM: Magnesium: 2 mg/dL (ref 1.7–2.4)

## 2021-07-26 LAB — PHOSPHORUS: Phosphorus: 3.2 mg/dL (ref 2.5–4.6)

## 2021-07-26 MED ORDER — ENOXAPARIN SODIUM 80 MG/0.8ML IJ SOSY
80.0000 mg | PREFILLED_SYRINGE | INTRAMUSCULAR | Status: DC
  Administered 2021-07-26 – 2021-07-28 (×3): 80 mg via SUBCUTANEOUS
  Filled 2021-07-26 (×4): qty 0.8

## 2021-07-26 MED ORDER — PANTOPRAZOLE SODIUM 40 MG IV SOLR
40.0000 mg | Freq: Once | INTRAVENOUS | Status: AC
  Administered 2021-07-26: 40 mg via INTRAVENOUS
  Filled 2021-07-26: qty 10

## 2021-07-26 MED ORDER — SODIUM CHLORIDE 0.9 % IV SOLN
6.2500 mg | Freq: Four times a day (QID) | INTRAVENOUS | Status: DC | PRN
  Administered 2021-07-26: 6.25 mg via INTRAVENOUS
  Filled 2021-07-26: qty 0.25

## 2021-07-26 MED ORDER — FENTANYL CITRATE PF 50 MCG/ML IJ SOSY
25.0000 ug | PREFILLED_SYRINGE | INTRAMUSCULAR | Status: DC | PRN
  Administered 2021-07-26 – 2021-07-27 (×3): 25 ug via INTRAVENOUS
  Filled 2021-07-26 (×3): qty 1

## 2021-07-26 MED ORDER — ORAL CARE MOUTH RINSE
15.0000 mL | OROMUCOSAL | Status: DC
  Administered 2021-07-26 – 2021-07-28 (×19): 15 mL via OROMUCOSAL

## 2021-07-26 MED ORDER — CHLORHEXIDINE GLUCONATE CLOTH 2 % EX PADS
6.0000 | MEDICATED_PAD | Freq: Every day | CUTANEOUS | Status: DC
  Administered 2021-07-26 – 2021-07-30 (×5): 6 via TOPICAL

## 2021-07-26 MED ORDER — PANTOPRAZOLE 2 MG/ML SUSPENSION
40.0000 mg | Freq: Every day | ORAL | Status: DC
  Administered 2021-07-27 – 2021-07-28 (×2): 40 mg
  Filled 2021-07-26 (×2): qty 20

## 2021-07-26 MED ORDER — CHLORHEXIDINE GLUCONATE 0.12 % MT SOLN
15.0000 mL | Freq: Once | OROMUCOSAL | Status: AC
  Administered 2021-07-26: 15 mL via OROMUCOSAL
  Filled 2021-07-26: qty 15

## 2021-07-26 MED ORDER — HEPARIN SODIUM (PORCINE) 5000 UNIT/ML IJ SOLN: 5000.0000 [IU] | Freq: Three times a day (TID) | INTRAMUSCULAR | Status: DC

## 2021-07-26 MED ORDER — FUROSEMIDE 10 MG/ML IJ SOLN
40.0000 mg | Freq: Two times a day (BID) | INTRAMUSCULAR | Status: AC
  Administered 2021-07-26 – 2021-07-27 (×3): 40 mg via INTRAVENOUS
  Filled 2021-07-26 (×3): qty 4

## 2021-07-26 MED ORDER — HEPARIN (PORCINE) 25000 UT/250ML-% IV SOLN: 900.0000 [IU]/h | INTRAVENOUS | Status: DC

## 2021-07-26 MED ORDER — ORAL CARE MOUTH RINSE: 15.0000 mL | OROMUCOSAL | Status: DC | PRN

## 2021-07-26 MED ORDER — ORAL CARE MOUTH RINSE
15.0000 mL | OROMUCOSAL | Status: DC
  Administered 2021-07-26 (×4): 15 mL via OROMUCOSAL

## 2021-07-26 NOTE — Inpatient Diabetes Management (Signed)
Inpatient Diabetes Program Recommendations  AACE/ADA: New Consensus Statement on Inpatient Glycemic Control (2015)  Target Ranges:  Prepandial:   less than 140 mg/dL      Peak postprandial:   less than 180 mg/dL (1-2 hours)      Critically ill patients:  140 - 180 mg/dL   Lab Results  Component Value Date   GLUCAP 173 (H) 07/26/2021   HGBA1C 7.8 (H) 07/25/2021    Review of Glycemic Control  Latest Reference Range & Units 07/26/21 01:26 07/26/21 04:26 07/26/21 08:12  Glucose-Capillary 70 - 99 mg/dL 272 (H) 536 (H) 644 (H)  (H): Data is abnormally high Diabetes history: Type 2 DM Outpatient Diabetes medications: Lantus 10 units QD Current orders for Inpatient glycemic control: Novolog 0-9 units Q4H  Inpatient Diabetes Program Recommendations:    Consider adding Semglee 8 units QD.   Thanks, Lujean Rave, MSN, RNC-OB Diabetes Coordinator [phone] (8a-5p)

## 2021-07-26 NOTE — H&P (Signed)
NAME:  Yolanda Saunders, MRN:  [id number], DOB:  01-08-1946, LOS: 1 ADMISSION DATE:  07/25/2021, CONSULTATION DATE: 7/623 REFERRING MD: Janann Colonel, CHIEF COMPLAINT: Respiratory failure  History of Present Illness:  Yolanda Saunders is a 76 year old woman with a history of HFrEF, stroke, type 2 diabetes who presented after waking up this morning with sudden onset shortness of breath.  She had been in her usual state of health yesterday, feeling very well.  She had minimal coughing last night, but otherwise her family reports that she had no recent symptoms other than ankle swelling.  She had been taking extra Lasix to address the ankle swelling.  She was started on gabapentin about a week ago with some dizziness since this started.  Due to her shortness of breath this morning her family called EMS.  When they arrived she was saturating in the 50s with a respiratory rate around 50.  She was placed on CPAP for transport to the emergency department.  With EMS she had reduced loss of consciousness that progressed to a 45-second PEA cardiac arrest.  With bag-valve-mask and chest compressions ROSC was achieved prior to administration of ACLS medications.  She was awake in the emergency department and initially placed on BiPAP.  She had an episode of agitation and vomiting into the BiPAP mask at which time she was emergently intubated.  She is on propofol for sedation and now requiring norepinephrine for hypotension.  Her family was able to provide additional history.  She recently has updated her advance directives, which they will bring a copy of to the hospital.  She reports her PCP at [facility] has a copy as well.  Family said she is DNR and she would only want aggressive healthcare measures such as intubation for 3 days max.  After that she would want to be kept comfortable.  Although family would use differently, they are very respectful of her wishes.  They admit that she prioritizes her quality of life.  Her  husband passed away in the past year, which has been very hard for her.  Pertinent  Medical History  HfpEF Fibromyalgia Type 2 diabetes GERD Stroke  Significant Hospital Events: Including procedures, antibiotic start and stop dates in addition to other pertinent events   7/6 admitted, intubated 7/6 Echocardiogram: EF 25% with global hypokinesis. Grade II diastolic dysfunction. EF has decreased since 2022.  7/6 No evidence of DVT by Doppler.  7/6 CXR consistent with CHF  Interim History / Subjective:  Started on antibiotics. Febrile overnight.  Norepinephrine requirements have decreased as propofol has been weaned.  Objective   Blood pressure (!) 131/53, pulse 62, temperature (!) 100.4 F (38 C), resp. rate (!) 22, height 5\' 3"  (1.6 m), weight 78.4 kg, SpO2 100 %.    Vent Mode: PRVC FiO2 (%):  [40 %-100 %] 40 % Set Rate:  [18 bmp-22 bmp] 22 bmp Vt Set:  [410 mL] 410 mL PEEP:  [5 cmH20-10 cmH20] 5 cmH20 Plateau Pressure:  [18 cmH20-28 cmH20] 19 cmH20   Intake/Output Summary (Last 24 hours) at 07/26/2021 0806 Last data filed at 07/26/2021 09/26/2021 Gross per 24 hour  Intake 799.04 ml  Output 1900 ml  Net -1100.96 ml   Filed Weights   07/25/21 2000 07/26/21 0003 07/26/21 0459  Weight: 78.5 kg 78.5 kg 78.4 kg    Examination: General: Critically ill-appearing woman lying in bed intubated, sedated.  Periodically gagging and coughing HENT: [place]/AT, eyes anicteric, endotracheal tube in place.  OG tube in place Lungs:  Chest clear bilaterally.  No adventitial sounds.  Still hypoventilated on PSV Cardiovascular: S3 gallop.  No murmurs. Abdomen: Soft, nontender, hypoactive bowel sounds Extremities: Pitting edema in the ankles Neuro: RASS -3, trying to localize to her endotracheal tube, Derm: Warm, dry, no diffuse rashes  Ancillary tests personally reviewed:   Normal ABG.  PF ratio greater than 150 Creatinine 1.59 Hemoglobin 11.6. Mild leukocytosis 16.9 Assessment & Plan:    Critically ill due to acute hypoxic respiratory failure requiring mechanical ventilation due to acute decompensated mixed HF. Sedation related hypotension.  Possible CAP. DVT has been ruled out. Known ischemic cardiomyopathy stage D Type 2 DM stress hyperglycemia.    Plan:   - On nominal ventilator settings, minimize sedation and SBT. - Phenergan for nausea which might provide sufficient sedation to allow propofol to be weaned off. - Low-dose fentanyl for tube tolerance. - Diurese again today. - Stop heparin as no evidence of ACS or VTE. - Wean NE to keep MAP greater than 65 - Continue empiric antibiotics and treat for 7 days unless cultures come back negative -As needed bronchodilator/ -Multiple family updated [name] in the ED, POA present. POA will bring paperwork tomorrow. She recently changed her code status to DNR and would not want prolonged time on aggressive life support- no more than about 3 days. Family present is unanimous about this, though tearful.    Best Practice (right click and "Reselect all SmartList Selections" daily)   Diet/type: tubefeeds on hold due to vomiting DVT prophylaxis: Prophylactic heparin GI prophylaxis: PPI Lines: N/A Foley:  N/A Code Status:  DNR Last date of multidisciplinary goals of care discussion [family updated in the ED 7/6]  CRITICAL CARE Performed by: Lynnell Catalan   Total critical care time: 40 minutes  Critical care time was exclusive of separately billable procedures and treating other patients.  Critical care was necessary to treat or prevent imminent or life-threatening deterioration.  Critical care was time spent personally by me on the following activities: development of treatment plan with patient and/or surrogate as well as nursing, discussions with consultants, evaluation of patient's response to treatment, examination of patient, obtaining history from patient or surrogate, ordering and performing treatments and  interventions, ordering and review of laboratory studies, ordering and review of radiographic studies, pulse oximetry, re-evaluation of patient's condition and participation in multidisciplinary rounds.  Lynnell Catalan, MD [facility] ICU Physician [facility]  Pager: [phone] Mobile: [phone] After hours: [phone].

## 2021-07-26 NOTE — TOC Initial Note (Signed)
Transition of Care [facility]) - Initial/Assessment Note    Patient Details  Name: Yolanda Saunders MRN: [id number] Date of Birth: [birth year]  Transition of Care [facility]) CM/SW Contact:    Durenda Guthrie, RN Phone Number: 07/26/2021, 2:57 PM  Clinical Narrative:                 Transition of Care Screening Note:  Transition of Care Department [facility]) has reviewed patient and no TOC needs have been identified at this time. We will continue to monitor patient advancement through Interdisciplinary progressions. If new patient transition needs arise, please place a consult.          Patient Goals and CMS Choice        Expected Discharge Plan and Services                                                Prior Living Arrangements/Services                       Activities of Daily Living Home Assistive Devices/Equipment: None ADL Screening (condition at time of admission) Patient's cognitive ability adequate to safely complete daily activities?: Yes Is the patient deaf or have difficulty hearing?: No Does the patient have difficulty seeing, even when wearing glasses/contacts?: No Does the patient have difficulty concentrating, remembering, or making decisions?: No Patient able to express need for assistance with ADLs?: Yes Does the patient have difficulty dressing or bathing?: No Independently performs ADLs?: Yes (appropriate for developmental age) Does the patient have difficulty walking or climbing stairs?: No Weakness of Legs: None Weakness of Arms/Hands: None  Permission Sought/Granted                  Emotional Assessment              Admission diagnosis:  Respiratory failure (HCC) [J96.90] Acute pulmonary edema (HCC) [J81.0] Acute hypercapnic respiratory failure (HCC) [J96.02] Acute hypoxemic respiratory failure (HCC) [J96.01] Patient Active Problem List   Diagnosis Date Noted   Chronic combined systolic and diastolic heart failure (HCC)  49/70/2637   COVID-19 virus infection 08/03/2020   Stroke (cerebrum) (HCC) 08/03/2020   Stroke (HCC) 08/02/2020   Lumbar radiculopathy 06/01/2020   Plantar fasciitis 06/03/2017   Syncope 10/15/2016   CKD (chronic kidney disease), stage III (HCC) 10/15/2016   Type 2 diabetes mellitus with stage 3 chronic kidney disease, with long-term current use of insulin (HCC) 08/07/2015   Bilateral leg weakness 06/06/2014   Abnormality of gait 06/06/2014   Lumbosacral stenosis 06/06/2014   Chest pain 04/16/2011   Hyperlipidemia 09/13/2008   Class 1 obesity 09/13/2008   Essential hypertension 09/13/2008   Cerebral artery occlusion with cerebral infarction (HCC) 09/13/2008   ASTHMA 09/13/2008   IRRITABLE BOWEL SYNDROME 09/13/2008   GALLSTONES 09/13/2008   UTI 09/13/2008   ARTHRITIS 09/13/2008   FIBROMYALGIA 09/13/2008   HEADACHE, CHRONIC 09/13/2008   PCP:  Johny Blamer, MD Pharmacy:   [facility] [id number]), [place] - [address] [place] ([place]) [place] [address] Phone: [phone] Fax: [phone]  [facility] [address] [place] [address] Phone: [phone] Fax: [phone]     Social Determinants of Health (SDOH) Interventions    Readmission Risk Interventions     No data to display

## 2021-07-26 NOTE — Plan of Care (Signed)
  Problem: Safety: Goal: Non-violent Restraint(s) 07/26/2021 2325 by Driscilla Grammes, Ardeen Fillers, RN Outcome: Completed/Met 07/26/2021 2058 by Driscilla Grammes, Ardeen Fillers, RN Outcome: Progressing

## 2021-07-26 NOTE — Progress Notes (Signed)
ANTICOAGULATION CONSULT NOTE   Pharmacy Consult for heparin Indication: pulmonary embolus  Allergies  Allergen Reactions   Codeine Other (See Comments)    "makes me crazy; see things; delusional"   Erythromycin Other (See Comments)    "peeled skin; like a sunburn & I turn the color of the pill; a kind of purple-look"   Lisinopril Cough    "cause I have asthma"   Penicillins Rash and Other (See Comments)    "puffy blisters   Shellfish Allergy     Throat swells   Sulfonamide Derivatives Hives    "watery hives"   Metoprolol Diarrhea and Nausea And Vomiting    Rapid weight gain.   Actos [Pioglitazone]     Upset GI   Amlodipine     Syncope   Benicar [Olmesartan] Other (See Comments)    Dizziness and HA   Byetta 10 Mcg Pen [Exenatide] Nausea And Vomiting    5 MCG pen   Clonidine Hcl Other (See Comments)   Cyclobenzaprine Other (See Comments)   Gabapentin Other (See Comments)    Anxiety    Glimepiride     Upset GI   Glipizide     Upset GI   Glyburide-Metformin     Myalgias   Humalog [Insulin Lispro]     Headache, SEVERE HYPERGLYCEMIA   Hydralazine Hcl Other (See Comments)   Invokana [Canagliflozin]     Yeast infections   Januvia [Sitagliptin]     uti   Other     Other reaction(s): Unknown   Prednisone Other (See Comments)   Reglan [Metoclopramide]     unknown   Septra [Sulfamethoxazole-Trimethoprim] Hives   Spironolactone     unk   Tramadol Other (See Comments)    anxiety   Victoza [Liraglutide] Diarrhea    Abdominal pain    Zocor [Simvastatin]     unknown   Losartan Potassium Rash    Upset GI   Novolin R [Insulin] Swelling and Rash    70/30    Patient Measurements: Height: 5\' 3"  (160 cm) Weight: 78.4 kg (172 lb 13.5 oz) IBW/kg (Calculated) : 52.4 Heparin Dosing Weight: 66kg  Vital Signs: Temp: 102 F (38.9 C) (07/07 0445) Temp Source: Axillary (07/07 0003) BP: 107/44 (07/07 0445) Pulse Rate: 68 (07/07 0445)  Labs: Recent Labs     07/25/21 1209 07/25/21 1228 07/25/21 1424 07/25/21 1448 07/26/21 0202 07/26/21 0445  HGB 11.8* 13.3  12.9  --  11.9* 12.0 11.6*  HCT 40.0 39.0  38.0  --  35.0* 38.1 34.0*  PLT 192  --   --   --  146*  --   APTT  --   --   --   --  184*  --   HEPARINUNFRC  --   --   --   --  >1.10*  --   CREATININE 1.70* 1.40*  --   --  1.59*  --   TROPONINIHS 68*  --  65*  --   --   --      Estimated Creatinine Clearance: 29.4 mL/min (A) (by C-G formula based on SCr of 1.59 mg/dL (H)).   Medical History: Past Medical History:  Diagnosis Date   Anemia 04/17/11   "long, long years ago"   Angina 04/16/11   "that's what I'm here for"   Anxiety    Arthritis    Hips and knees   Asthma    Carpal tunnel syndrome    Cataracts, bilateral    Chronic combined systolic  and diastolic heart failure (HCC) 08/07/2020   CKD (chronic kidney disease), stage III (HCC)    Complication of anesthesia 1987   "affected my eyes; couldn't see anything but blurrs even the next day"   CVA (cerebral infarction)    After cardiac catheter 02/2000   Depression    Edema    Fibromyalgia    GERD (gastroesophageal reflux disease)    Headache(784.0)    High cholesterol    History of bronchitis    Hypertension    IBS (irritable bowel syndrome)    Migraines    Panic attacks 04/17/11   "don't take anything for it"   Pneumonia 04/17/11   "probably as many as 7 times"   Renal disorder 04/17/11   "they are working at 60% capacity"   Shortness of breath on exertion    "cause of my asthma"   Stroke (HCC) 2002   residual "problem w/using the right word, left 5 lesions on my brain/MRI; long term memory loss"   Type II diabetes mellitus (HCC)    Assessment: 21 YOF presenting with sudden onset SOB, intubated in ED, pharmacy consulted for heparin for empiric PE rule out.  She has hx of afib and is on Eliquis PTA, last taken last night, will follow aPTT until correlated with anti-Xa  Aptt above goal: 184 seconds, morning lab  was collected from opposite arm as the heparin infusion. CBC stable, no overt bruising or bleeding  Goal of Therapy:  Heparin level 0.3-0.7 units/ml aPTT 66-102 seconds Monitor platelets by anticoagulation protocol: Yes   Plan:  Hold heparin infusion x1 hour Resume heparin infusion at 900 units/hr at 0645 F/u 8 hour aPTT and heparin level F/u PE workup  Ruben Im, PharmD Clinical Pharmacist 07/26/2021 5:46 AM Please check AMION for all [facility] numbers

## 2021-07-26 NOTE — Progress Notes (Signed)
eLink Physician-Brief Progress Note Patient Name: Yolanda Saunders DOB: [birth year] MRN: [id number]   Date of Service  07/26/2021  HPI/Events of Note  Pain/Agitation   eICU Interventions  Plan: Fentanyl 25 mcg IV Q 2 hours PRN pain or agitation.      Intervention Category Major Interventions: Other:  Lenell Antu 07/26/2021, 7:57 PM

## 2021-07-26 NOTE — Progress Notes (Signed)
eLink Physician-Brief Progress Note Patient Name: VIRGINIE JOSTEN DOB: 15-Feb-1945 MRN: [id number]   Date of Service  07/26/2021  HPI/Events of Note  Fever to 102.2 F - AST = 68 and ALT = 68.  Therefore, can't use Tylenol.  Already on Rocephin.  eICU Interventions  Plan: Cooling blanket PRN.     Intervention Category Major Interventions: Other:  Ardene Remley Dennard Nip 07/26/2021, 4:28 AM

## 2021-07-26 NOTE — Plan of Care (Signed)
  Problem: Education: Goal: Ability to describe self-care measures that may prevent or decrease complications (Diabetes Survival Skills Education) will improve Outcome: Progressing Goal: Individualized Educational Video(s) Outcome: Progressing   Problem: Coping: Goal: Ability to adjust to condition or change in health will improve Outcome: Progressing   Problem: Fluid Volume: Goal: Ability to maintain a balanced intake and output will improve Outcome: Progressing   Problem: Health Behavior/Discharge Planning: Goal: Ability to identify and utilize available resources and services will improve Outcome: Progressing Goal: Ability to manage health-related needs will improve Outcome: Progressing   Problem: Metabolic: Goal: Ability to maintain appropriate glucose levels will improve Outcome: Progressing   Problem: Nutritional: Goal: Maintenance of adequate nutrition will improve Outcome: Progressing Goal: Progress toward achieving an optimal weight will improve Outcome: Progressing   Problem: Skin Integrity: Goal: Risk for impaired skin integrity will decrease Outcome: Progressing   Problem: Tissue Perfusion: Goal: Adequacy of tissue perfusion will improve Outcome: Progressing   Problem: Safety: Goal: Non-violent Restraint(s) Outcome: Progressing   Problem: Education: Goal: Knowledge of General Education information will improve Description: Including pain rating scale, medication(s)/side effects and non-pharmacologic comfort measures Outcome: Progressing   Problem: Health Behavior/Discharge Planning: Goal: Ability to manage health-related needs will improve Outcome: Progressing   Problem: Clinical Measurements: Goal: Ability to maintain clinical measurements within normal limits will improve Outcome: Progressing Goal: Will remain free from infection Outcome: Progressing Goal: Diagnostic test results will improve Outcome: Progressing Goal: Respiratory complications  will improve Outcome: Progressing Goal: Cardiovascular complication will be avoided Outcome: Progressing   Problem: Activity: Goal: Risk for activity intolerance will decrease Outcome: Progressing   Problem: Nutrition: Goal: Adequate nutrition will be maintained Outcome: Progressing   Problem: Coping: Goal: Level of anxiety will decrease Outcome: Progressing   Problem: Elimination: Goal: Will not experience complications related to bowel motility Outcome: Progressing Goal: Will not experience complications related to urinary retention Outcome: Progressing   Problem: Pain Managment: Goal: General experience of comfort will improve Outcome: Progressing   Problem: Safety: Goal: Ability to remain free from injury will improve Outcome: Progressing   Problem: Skin Integrity: Goal: Risk for impaired skin integrity will decrease Outcome: Progressing   

## 2021-07-27 DIAGNOSIS — J9601 Acute respiratory failure with hypoxia: Secondary | ICD-10-CM | POA: Diagnosis not present

## 2021-07-27 LAB — COMPREHENSIVE METABOLIC PANEL
ALT: 35 U/L (ref 0–44)
AST: 21 U/L (ref 15–41)
Albumin: 2.9 g/dL — ABNORMAL LOW (ref 3.5–5.0)
Alkaline Phosphatase: 47 U/L (ref 38–126)
Anion gap: 13 (ref 5–15)
BUN: 33 mg/dL — ABNORMAL HIGH (ref 8–23)
CO2: 26 mmol/L (ref 22–32)
Calcium: 9.1 mg/dL (ref 8.9–10.3)
Chloride: 105 mmol/L (ref 98–111)
Creatinine, Ser: 1.68 mg/dL — ABNORMAL HIGH (ref 0.44–1.00)
GFR, Estimated: 31 mL/min — ABNORMAL LOW (ref >60)
Glucose, Bld: 169 mg/dL — ABNORMAL HIGH (ref 70–99)
Potassium: 3 mmol/L — ABNORMAL LOW (ref 3.5–5.1)
Sodium: 144 mmol/L (ref 135–145)
Total Bilirubin: 1.1 mg/dL (ref 0.3–1.2)
Total Protein: 5.6 g/dL — ABNORMAL LOW (ref 6.5–8.1)

## 2021-07-27 LAB — CBC
HCT: 30.7 % — ABNORMAL LOW (ref 36.0–46.0)
Hemoglobin: 10.2 g/dL — ABNORMAL LOW (ref 12.0–15.0)
MCH: 31.1 pg (ref 26.0–34.0)
MCHC: 33.2 g/dL (ref 30.0–36.0)
MCV: 93.6 fL (ref 80.0–100.0)
Platelets: 123 10*3/uL — ABNORMAL LOW (ref 150–400)
RBC: 3.28 MIL/uL — ABNORMAL LOW (ref 3.87–5.11)
RDW: 14.1 % (ref 11.5–15.5)
WBC: 11.8 10*3/uL — ABNORMAL HIGH (ref 4.0–10.5)
nRBC: 0 % (ref 0.0–0.2)

## 2021-07-27 LAB — GLUCOSE, CAPILLARY
Glucose-Capillary: 171 mg/dL — ABNORMAL HIGH (ref 70–99)
Glucose-Capillary: 172 mg/dL — ABNORMAL HIGH (ref 70–99)
Glucose-Capillary: 177 mg/dL — ABNORMAL HIGH (ref 70–99)
Glucose-Capillary: 179 mg/dL — ABNORMAL HIGH (ref 70–99)
Glucose-Capillary: 180 mg/dL — ABNORMAL HIGH (ref 70–99)
Glucose-Capillary: 181 mg/dL — ABNORMAL HIGH (ref 70–99)
Glucose-Capillary: 183 mg/dL — ABNORMAL HIGH (ref 70–99)
Glucose-Capillary: 190 mg/dL — ABNORMAL HIGH (ref 70–99)
Glucose-Capillary: 213 mg/dL — ABNORMAL HIGH (ref 70–99)

## 2021-07-27 MED ORDER — GABAPENTIN 250 MG/5ML PO SOLN
100.0000 mg | Freq: Every day | ORAL | Status: DC
Start: 2021-07-27 — End: 2021-07-28
  Administered 2021-07-27 – 2021-07-28 (×2): 100 mg via ORAL
  Filled 2021-07-27 (×2): qty 2

## 2021-07-27 MED ORDER — POTASSIUM CHLORIDE 20 MEQ PO PACK
20.0000 meq | PACK | ORAL | Status: DC
  Administered 2021-07-27: 20 meq
  Filled 2021-07-27 (×2): qty 1

## 2021-07-27 MED ORDER — POTASSIUM CHLORIDE 10 MEQ/100ML IV SOLN
10.0000 meq | Freq: Once | INTRAVENOUS | Status: AC
  Administered 2021-07-27: 10 meq via INTRAVENOUS

## 2021-07-27 MED ORDER — POTASSIUM CHLORIDE 20 MEQ PO PACK
40.0000 meq | PACK | Freq: Two times a day (BID) | ORAL | Status: AC
  Administered 2021-07-27 – 2021-07-28 (×3): 40 meq via ORAL
  Filled 2021-07-27 (×3): qty 2

## 2021-07-27 MED ORDER — POTASSIUM CHLORIDE 10 MEQ/100ML IV SOLN
10.0000 meq | INTRAVENOUS | Status: DC
  Administered 2021-07-27 (×3): 10 meq via INTRAVENOUS
  Filled 2021-07-27 (×2): qty 100

## 2021-07-27 MED ORDER — ACETAMINOPHEN 160 MG/5ML PO SOLN
500.0000 mg | Freq: Three times a day (TID) | ORAL | Status: DC | PRN
  Administered 2021-07-27: 500 mg via ORAL
  Filled 2021-07-27: qty 20.3

## 2021-07-27 NOTE — Progress Notes (Signed)
eLink Physician-Brief Progress Note Patient Name: Yolanda Saunders DOB: March 07, 1945 MRN: [id number]   Date of Service  07/27/2021  HPI/Events of Note  Complaining of back and wrist pain. Was told to avoid fentanyl push that was ordered. Takes Gabapentin 100mg  amd Tylenol at home. Can we restart?   eICU Interventions  Gabapentin and Tylenol ordered     Intervention Category Intermediate Interventions: Pain - evaluation and management  07/27/2021, 10:30 PM

## 2021-07-27 NOTE — Progress Notes (Signed)
Placed back on full support due to no patient effort

## 2021-07-27 NOTE — Plan of Care (Signed)
  Problem: Metabolic: Goal: Ability to maintain appropriate glucose levels will improve Outcome: Progressing   Problem: Skin Integrity: Goal: Risk for impaired skin integrity will decrease Outcome: Progressing   Problem: Tissue Perfusion: Goal: Adequacy of tissue perfusion will improve Outcome: Progressing   Problem: Clinical Measurements: Goal: Will remain free from infection Outcome: Progressing   Problem: Elimination: Goal: Will not experience complications related to urinary retention Outcome: Progressing   Problem: Pain Managment: Goal: General experience of comfort will improve Outcome: Progressing   Problem: Safety: Goal: Ability to remain free from injury will improve Outcome: Progressing   Problem: Skin Integrity: Goal: Risk for impaired skin integrity will decrease Outcome: Progressing

## 2021-07-27 NOTE — Progress Notes (Signed)
NAME:  MACARIA BIAS, MRN:  [id number], DOB:  29-Apr-1945, LOS: 2 ADMISSION DATE:  07/25/2021, CONSULTATION DATE: 7/623 REFERRING MD: Janann Colonel, CHIEF COMPLAINT: Respiratory failure  History of Present Illness:  Mrs. Kishi is a 76 year old woman with a history of HFrEF, stroke, type 2 diabetes who presented after waking up this morning with sudden onset shortness of breath.  She had been in her usual state of health yesterday, feeling very well.  She had minimal coughing last night, but otherwise her family reports that she had no recent symptoms other than ankle swelling.  She had been taking extra Lasix to address the ankle swelling.  She was started on gabapentin about a week ago with some dizziness since this started.  Due to her shortness of breath this morning her family called EMS.  When they arrived she was saturating in the 50s with a respiratory rate around 50.  She was placed on CPAP for transport to the emergency department.  With EMS she had reduced loss of consciousness that progressed to a 45-second PEA cardiac arrest.  With bag-valve-mask and chest compressions ROSC was achieved prior to administration of ACLS medications.  She was awake in the emergency department and initially placed on BiPAP.  She had an episode of agitation and vomiting into the BiPAP mask at which time she was emergently intubated.  She is on propofol for sedation and now requiring norepinephrine for hypotension.  Her family was able to provide additional history.  She recently has updated her advance directives, which they will bring a copy of to the hospital.  She reports her PCP at [facility] has a copy as well.  Family said she is DNR and she would only want aggressive healthcare measures such as intubation for 3 days max.  After that she would want to be kept comfortable.  Although family would use differently, they are very respectful of her wishes.  They admit that she prioritizes her quality of life.  Her  husband passed away in the past year, which has been very hard for her.  Pertinent  Medical History  HfpEF Fibromyalgia Type 2 diabetes GERD Stroke  Significant Hospital Events: Including procedures, antibiotic start and stop dates in addition to other pertinent events   7/6 admitted, intubated 7/6 Echocardiogram: EF 25% with global hypokinesis. Grade II diastolic dysfunction. EF has decreased since 2022.  7/6 No evidence of DVT by Doppler.  7/6 CXR consistent with CHF  Interim History / Subjective:  Awake off all sedation, failed to wean due to apnea.  Objective   Blood pressure (!) 141/54, pulse 71, temperature 99.9 F (37.7 C), resp. rate (!) 22, height 5\' 3"  (1.6 m), weight 75.8 kg, SpO2 99 %.    Vent Mode: PRVC FiO2 (%):  [40 %] 40 % Set Rate:  [22 bmp-24 bmp] 22 bmp Vt Set:  [410 mL] 410 mL PEEP:  [5 cmH20] 5 cmH20 Pressure Support:  [10 cmH20] 10 cmH20 Plateau Pressure:  [15 cmH20] 15 cmH20   Intake/Output Summary (Last 24 hours) at 07/27/2021 0940 Last data filed at 07/27/2021 0827 Gross per 24 hour  Intake 355.73 ml  Output 2470 ml  Net -2114.27 ml    Filed Weights   07/26/21 0003 07/26/21 0459 07/27/21 0500  Weight: 78.5 kg 78.4 kg 75.8 kg    Examination: General: Critically ill-appearing woman lying in bed intubated, sedated.  Periodically gagging and coughing HENT: [place]/AT, eyes anicteric, endotracheal tube in place.  OG tube in place Lungs: Chest  clear bilaterally.  No adventitial sounds.  Still hypoventilated on PSV Cardiovascular: S3 gallop.  No murmurs. Abdomen: Soft, nontender, hypoactive bowel sounds Extremities: Pitting edema in the ankles Neuro: RASS 0, trying to localize to her endotracheal tube, Derm: Warm, dry, no diffuse rashes  Ancillary tests personally reviewed:   pH 7.5  Creatinine 1.68 Hemoglobin 11.6. Mild leukocytosis 16.9 No growth on cultures.  Assessment & Plan:   Critically ill due to acute hypoxic respiratory failure  requiring mechanical ventilation due to acute decompensated mixed HF. Sedation related hypotension.  Possible CAP. DVT has been ruled out. Known ischemic cardiomyopathy stage D Type 2 DM stress hyperglycemia.    Plan:   - Decrease respiratory rate from ventilator, allow patient to overbreathe to correct alkalosis and try SBT again - likely extubation today.  - Low-dose fentanyl for tube tolerance. - Hold diuretic due to increased creatinine - Stop antibiotics.  -As needed bronchodilator/ -Multiple family updated by Dr Chestine Spore in the ED, POA present. POA will bring paperwork tomorrow. She recently changed her code status to DNR and would not want prolonged time on aggressive life support- no more than about 3 days. Family present is unanimous about this, though tearful.    Best Practice (right click and "Reselect all SmartList Selections" daily)   Diet/type: tubefeeds on hold due to vomiting DVT prophylaxis: Prophylactic heparin GI prophylaxis: PPI Lines: N/A Foley:  N/A Code Status:  DNR Last date of multidisciplinary goals of care discussion [family updated in the ED 7/6]  CRITICAL CARE Performed by: Lynnell Catalan   Total critical care time: 40 minutes  Critical care time was exclusive of separately billable procedures and treating other patients.  Critical care was necessary to treat or prevent imminent or life-threatening deterioration.  Critical care was time spent personally by me on the following activities: development of treatment plan with patient and/or surrogate as well as nursing, discussions with consultants, evaluation of patient's response to treatment, examination of patient, obtaining history from patient or surrogate, ordering and performing treatments and interventions, ordering and review of laboratory studies, ordering and review of radiographic studies, pulse oximetry, re-evaluation of patient's condition and participation in multidisciplinary rounds.  Lynnell Catalan, MD [facility] ICU Physician [facility] Critical Care  Pager: [phone] Mobile: [phone] After hours: [phone].

## 2021-07-27 NOTE — Progress Notes (Signed)
[facility] ADULT ICU REPLACEMENT PROTOCOL   The patient does apply for the [facility] Adult ICU Electrolyte Replacment Protocol based on the criteria listed below:   1.Exclusion criteria: TCTS patients, ECMO patients, and Dialysis patients 2. Is GFR >/= 30 ml/min? Yes.    Patient's GFR today is 31 3. Is SCr </= 2? Yes.   Patient's SCr is 1.68 mg/dL 4. Did SCr increase >/= 0.5 in 24 hours? No. 5.Pt's weight >40kg  Yes.   6. Abnormal electrolyte(s):   K 3.0  7. Electrolytes replaced per protocol 8.  Call MD STAT for K+ </= 2.5, Phos </= 1, or Mag </= 1 Physician:  S. Bobbye Morton R Viren Lebeau 07/27/2021 5:18 AM

## 2021-07-28 DIAGNOSIS — J9601 Acute respiratory failure with hypoxia: Secondary | ICD-10-CM | POA: Diagnosis not present

## 2021-07-28 LAB — POCT I-STAT 7, (LYTES, BLD GAS, ICA,H+H)
Acid-Base Excess: 4 mmol/L — ABNORMAL HIGH (ref 0.0–2.0)
Bicarbonate: 26.5 mmol/L (ref 20.0–28.0)
Calcium, Ion: 1.29 mmol/L (ref 1.15–1.40)
HCT: 39 % (ref 36.0–46.0)
Hemoglobin: 13.3 g/dL (ref 12.0–15.0)
O2 Saturation: 99 %
Patient temperature: 99.7
Potassium: 4.1 mmol/L (ref 3.5–5.1)
Sodium: 146 mmol/L — ABNORMAL HIGH (ref 135–145)
TCO2: 27 mmol/L (ref 22–32)
pCO2 arterial: 33.1 mmHg (ref 32–48)
pH, Arterial: 7.514 — ABNORMAL HIGH (ref 7.35–7.45)
pO2, Arterial: 128 mmHg — ABNORMAL HIGH (ref 83–108)

## 2021-07-28 LAB — CBC
HCT: 31.4 % — ABNORMAL LOW (ref 36.0–46.0)
Hemoglobin: 10 g/dL — ABNORMAL LOW (ref 12.0–15.0)
MCH: 31.2 pg (ref 26.0–34.0)
MCHC: 31.8 g/dL (ref 30.0–36.0)
MCV: 97.8 fL (ref 80.0–100.0)
Platelets: 125 10*3/uL — ABNORMAL LOW (ref 150–400)
RBC: 3.21 MIL/uL — ABNORMAL LOW (ref 3.87–5.11)
RDW: 13.9 % (ref 11.5–15.5)
WBC: 10 10*3/uL (ref 4.0–10.5)
nRBC: 0 % (ref 0.0–0.2)

## 2021-07-28 LAB — GLUCOSE, CAPILLARY
Glucose-Capillary: 143 mg/dL — ABNORMAL HIGH (ref 70–99)
Glucose-Capillary: 154 mg/dL — ABNORMAL HIGH (ref 70–99)
Glucose-Capillary: 163 mg/dL — ABNORMAL HIGH (ref 70–99)
Glucose-Capillary: 170 mg/dL — ABNORMAL HIGH (ref 70–99)
Glucose-Capillary: 173 mg/dL — ABNORMAL HIGH (ref 70–99)
Glucose-Capillary: 194 mg/dL — ABNORMAL HIGH (ref 70–99)

## 2021-07-28 LAB — BASIC METABOLIC PANEL
Anion gap: 11 (ref 5–15)
BUN: 30 mg/dL — ABNORMAL HIGH (ref 8–23)
CO2: 24 mmol/L (ref 22–32)
Calcium: 9.2 mg/dL (ref 8.9–10.3)
Chloride: 108 mmol/L (ref 98–111)
Creatinine, Ser: 1.45 mg/dL — ABNORMAL HIGH (ref 0.44–1.00)
GFR, Estimated: 37 mL/min — ABNORMAL LOW (ref >60)
Glucose, Bld: 202 mg/dL — ABNORMAL HIGH (ref 70–99)
Potassium: 3.8 mmol/L (ref 3.5–5.1)
Sodium: 143 mmol/L (ref 135–145)

## 2021-07-28 MED ORDER — INSULIN ASPART 100 UNIT/ML IJ SOLN
0.0000 [IU] | Freq: Three times a day (TID) | INTRAMUSCULAR | Status: DC
  Administered 2021-07-29: 1 [IU] via SUBCUTANEOUS
  Administered 2021-07-29: 5 [IU] via SUBCUTANEOUS
  Administered 2021-07-29: 3 [IU] via SUBCUTANEOUS
  Administered 2021-07-30: 1 [IU] via SUBCUTANEOUS

## 2021-07-28 MED ORDER — GABAPENTIN 100 MG PO CAPS
100.0000 mg | ORAL_CAPSULE | Freq: Every day | ORAL | Status: DC
  Administered 2021-07-29: 100 mg via ORAL
  Filled 2021-07-28: qty 1

## 2021-07-28 MED ORDER — PANTOPRAZOLE SODIUM 40 MG PO TBEC: 40.0000 mg | DELAYED_RELEASE_TABLET | Freq: Every day | ORAL | Status: DC

## 2021-07-28 MED ORDER — DOCUSATE SODIUM 100 MG PO CAPS
100.0000 mg | ORAL_CAPSULE | Freq: Two times a day (BID) | ORAL | Status: DC | PRN
  Administered 2021-07-29: 100 mg via ORAL
  Filled 2021-07-28: qty 1

## 2021-07-28 MED ORDER — ACETAMINOPHEN 500 MG PO TABS: 500.0000 mg | ORAL_TABLET | Freq: Three times a day (TID) | ORAL | Status: DC | PRN

## 2021-07-28 NOTE — Progress Notes (Signed)
NAME:  Yolanda Saunders, MRN:  [id number], DOB:  [birth year], LOS: 3 ADMISSION DATE:  07/25/2021, CONSULTATION DATE: 7/623 REFERRING MD: Janann Colonel, CHIEF COMPLAINT: Respiratory failure  History of Present Illness:  Yolanda Saunders is a 76 year old woman with a history of HFrEF, stroke, type 2 diabetes who presented after waking up this morning with sudden onset shortness of breath.  She had been in her usual state of health yesterday, feeling very well.  She had minimal coughing last night, but otherwise her family reports that she had no recent symptoms other than ankle swelling.  She had been taking extra Lasix to address the ankle swelling.  She was started on gabapentin about a week ago with some dizziness since this started.  Due to her shortness of breath this morning her family called EMS.  When they arrived she was saturating in the 50s with a respiratory rate around 50.  She was placed on CPAP for transport to the emergency department.  With EMS she had reduced loss of consciousness that progressed to a 45-second PEA cardiac arrest.  With bag-valve-mask and chest compressions ROSC was achieved prior to administration of ACLS medications.  She was awake in the emergency department and initially placed on BiPAP.  She had an episode of agitation and vomiting into the BiPAP mask at which time she was emergently intubated.  She is on propofol for sedation and now requiring norepinephrine for hypotension.  Her family was able to provide additional history.  She recently has updated her advance directives, which they will bring a copy of to the hospital.  She reports her PCP at [facility] has a copy as well.  Family said she is DNR and she would only want aggressive healthcare measures such as intubation for 3 days max.  After that she would want to be kept comfortable.  Although family would use differently, they are very respectful of her wishes.  They admit that she prioritizes her quality of life.  Her  husband passed away in the past year, which has been very hard for her.  Pertinent  Medical History  HfpEF Fibromyalgia Type 2 diabetes GERD Stroke  Significant Hospital Events: Including procedures, antibiotic start and stop dates in addition to other pertinent events   7/6 admitted, intubated 7/6 Echocardiogram: EF 25% with global hypokinesis. Grade II diastolic dysfunction. EF has decreased since 2022.  7/6 No evidence of DVT by Doppler.  7/6 CXR consistent with CHF  Interim History / Subjective:  Continues to have apneic episodes on PSV when drifts asleep.   Objective   Blood pressure (!) 122/58, pulse 68, temperature 100 F (37.8 C), resp. rate 16, height 5\' 3"  (1.6 m), weight 73.2 kg, SpO2 98 %.    Vent Mode: PSV;CPAP FiO2 (%):  [30 %-40 %] 30 % Set Rate:  [15 bmp] 15 bmp Vt Set:  [410 mL] 410 mL PEEP:  [5 cmH20] 5 cmH20 Pressure Support:  [5 cmH20] 5 cmH20 Plateau Pressure:  [13 cmH20-14 cmH20] 13 cmH20   Intake/Output Summary (Last 24 hours) at 07/28/2021 1448 Last data filed at 07/28/2021 1300 Gross per 24 hour  Intake --  Output 1335 ml  Net -1335 ml    Filed Weights   07/26/21 0459 07/27/21 0500 07/28/21 0500  Weight: 78.4 kg 75.8 kg 73.2 kg    Examination: General: Critically ill-appearing woman lying in bed intubated, not sedated.  Periodically gagging and coughing HENT: [place]/AT, eyes anicteric, endotracheal tube in place.  OG tube in place Lungs:  Chest clear bilaterally.  No adventitial sounds.  Still hypoventilated on PSV Cardiovascular: S3 gallop.  No murmurs. Abdomen: Soft, nontender, hypoactive bowel sounds Extremities: Pitting edema in the ankles Neuro: RASS 0, trying to localize to her endotracheal tube, Derm: Warm, dry, no diffuse rashes  Ancillary tests personally reviewed:   pH 7.5  Creatinine 1.68 Hemoglobin 11.6. Mild leukocytosis 16.9 No growth on cultures.  Assessment & Plan:   Critically ill due to acute hypoxic respiratory failure  requiring mechanical ventilation due to acute decompensated mixed HF. Sedation related hypotension.  Possible CAP. DVT has been ruled out. Known ischemic cardiomyopathy stage D Type 2 DM stress hyperglycemia.    Plan:   - Trial of extubation. Patient may have chronic central apnea from chronic HFrEF. She is awake and can be bridged with BIPAP if necessary. Family and patient agree.  -As needed bronchodilator/ -Multiple family updated by Dr Chestine Spore in the ED, POA present. POA will bring paperwork tomorrow. She recently changed her code status to DNR and would not want prolonged time on aggressive life support- no more than about 3 days. Family present is unanimous about this, though tearful.    Best Practice (right click and "Reselect all SmartList Selections" daily)   Diet/type: tubefeeds on hold due to vomiting DVT prophylaxis: Prophylactic heparin GI prophylaxis: PPI Lines: N/A Foley:  N/A Code Status:  DNR Last date of multidisciplinary goals of care discussion [family updated in the ED 7/6]  CRITICAL CARE Performed by: Lynnell Catalan   Total critical care time: 35 minutes  Critical care time was exclusive of separately billable procedures and treating other patients.  Critical care was necessary to treat or prevent imminent or life-threatening deterioration.  Critical care was time spent personally by me on the following activities: development of treatment plan with patient and/or surrogate as well as nursing, discussions with consultants, evaluation of patient's response to treatment, examination of patient, obtaining history from patient or surrogate, ordering and performing treatments and interventions, ordering and review of laboratory studies, ordering and review of radiographic studies, pulse oximetry, re-evaluation of patient's condition and participation in multidisciplinary rounds.  Lynnell Catalan, MD [facility] ICU Physician [facility] [place] [facility]  Pager:  [phone] Mobile: [phone] After hours: [phone].

## 2021-07-28 NOTE — Procedures (Signed)
Extubation Procedure Note  Patient Details:   Name: Yolanda Saunders DOB: October 23, 1945 MRN: [id number]   Airway Documentation:    Vent end date: 07/28/21 Vent end time: 1420   Evaluation  O2 sats: stable throughout Complications: No apparent complications Patient did tolerate procedure well. Bilateral Breath Sounds: Rhonchi   Yes, pt able to cough to clear secretions and vocalize name. Placed on 3L nasal cannula and pt is tolerating well at this time. RT to continue to monitor as needed.  Tacy Learn 07/28/2021, 2:29 PM

## 2021-07-28 NOTE — Plan of Care (Signed)
  Problem: Education: Goal: Ability to describe self-care measures that may prevent or decrease complications (Diabetes Survival Skills Education) will improve Outcome: Not Met (add Reason) Goal: Individualized Educational Video(s) Outcome: Not Met (add Reason)   Problem: Coping: Goal: Ability to adjust to condition or change in health will improve Outcome: Progressing   Problem: Fluid Volume: Goal: Ability to maintain a balanced intake and output will improve Outcome: Not Progressing   Problem: Health Behavior/Discharge Planning: Goal: Ability to identify and utilize available resources and services will improve Outcome: Progressing Goal: Ability to manage health-related needs will improve Outcome: Progressing   Problem: Metabolic: Goal: Ability to maintain appropriate glucose levels will improve Outcome: Progressing   Problem: Nutritional: Goal: Maintenance of adequate nutrition will improve Outcome: Not Progressing Goal: Progress toward achieving an optimal weight will improve Outcome: Progressing   Problem: Skin Integrity: Goal: Risk for impaired skin integrity will decrease Outcome: Progressing   Problem: Tissue Perfusion: Goal: Adequacy of tissue perfusion will improve Outcome: Progressing   Problem: Education: Goal: Knowledge of General Education information will improve Description: Including pain rating scale, medication(s)/side effects and non-pharmacologic comfort measures Outcome: Progressing   Problem: Health Behavior/Discharge Planning: Goal: Ability to manage health-related needs will improve Outcome: Progressing   Problem: Clinical Measurements: Goal: Ability to maintain clinical measurements within normal limits will improve Outcome: Progressing Goal: Will remain free from infection Outcome: Progressing Goal: Diagnostic test results will improve Outcome: Progressing Goal: Respiratory complications will improve Outcome: Progressing Goal:  Cardiovascular complication will be avoided Outcome: Progressing   Problem: Activity: Goal: Risk for activity intolerance will decrease Outcome: Progressing   Problem: Nutrition: Goal: Adequate nutrition will be maintained Outcome: Not Progressing   Problem: Coping: Goal: Level of anxiety will decrease Outcome: Progressing   Problem: Elimination: Goal: Will not experience complications related to bowel motility Outcome: Progressing Goal: Will not experience complications related to urinary retention Outcome: Progressing   Problem: Pain Managment: Goal: General experience of comfort will improve Outcome: Progressing   Problem: Safety: Goal: Ability to remain free from injury will improve Outcome: Progressing   Problem: Skin Integrity: Goal: Risk for impaired skin integrity will decrease Outcome: Progressing

## 2021-07-29 ENCOUNTER — Encounter (HOSPITAL_COMMUNITY): Payer: Self-pay | Admitting: Critical Care Medicine

## 2021-07-29 ENCOUNTER — Telehealth (HOSPITAL_COMMUNITY): Payer: Self-pay | Admitting: Pharmacy Technician

## 2021-07-29 ENCOUNTER — Other Ambulatory Visit (HOSPITAL_COMMUNITY): Payer: Self-pay

## 2021-07-29 DIAGNOSIS — J9601 Acute respiratory failure with hypoxia: Secondary | ICD-10-CM | POA: Diagnosis not present

## 2021-07-29 LAB — BASIC METABOLIC PANEL
Anion gap: 13 (ref 5–15)
BUN: 30 mg/dL — ABNORMAL HIGH (ref 8–23)
CO2: 24 mmol/L (ref 22–32)
Calcium: 9.7 mg/dL (ref 8.9–10.3)
Chloride: 109 mmol/L (ref 98–111)
Creatinine, Ser: 1.41 mg/dL — ABNORMAL HIGH (ref 0.44–1.00)
GFR, Estimated: 38 mL/min — ABNORMAL LOW (ref >60)
Glucose, Bld: 173 mg/dL — ABNORMAL HIGH (ref 70–99)
Potassium: 4 mmol/L (ref 3.5–5.1)
Sodium: 146 mmol/L — ABNORMAL HIGH (ref 135–145)

## 2021-07-29 LAB — GLUCOSE, CAPILLARY
Glucose-Capillary: 134 mg/dL — ABNORMAL HIGH (ref 70–99)
Glucose-Capillary: 234 mg/dL — ABNORMAL HIGH (ref 70–99)
Glucose-Capillary: 256 mg/dL — ABNORMAL HIGH (ref 70–99)
Glucose-Capillary: 150 mg/dL — ABNORMAL HIGH (ref 70–99)

## 2021-07-29 LAB — CBC
HCT: 30.6 % — ABNORMAL LOW (ref 36.0–46.0)
Hemoglobin: 9.8 g/dL — ABNORMAL LOW (ref 12.0–15.0)
MCH: 31.6 pg (ref 26.0–34.0)
MCHC: 32 g/dL (ref 30.0–36.0)
MCV: 98.7 fL (ref 80.0–100.0)
Platelets: 126 10*3/uL — ABNORMAL LOW (ref 150–400)
RBC: 3.1 MIL/uL — ABNORMAL LOW (ref 3.87–5.11)
RDW: 13.6 % (ref 11.5–15.5)
WBC: 7.1 10*3/uL (ref 4.0–10.5)
nRBC: 0 % (ref 0.0–0.2)

## 2021-07-29 MED ORDER — SERTRALINE HCL 50 MG PO TABS
50.0000 mg | ORAL_TABLET | Freq: Every day | ORAL | Status: DC
  Administered 2021-07-29 – 2021-07-30 (×2): 50 mg via ORAL
  Filled 2021-07-29 (×2): qty 1

## 2021-07-29 MED ORDER — APIXABAN 5 MG PO TABS
5.0000 mg | ORAL_TABLET | Freq: Two times a day (BID) | ORAL | Status: DC
  Administered 2021-07-29 – 2021-07-30 (×3): 5 mg via ORAL
  Filled 2021-07-29 (×3): qty 1

## 2021-07-29 MED ORDER — SPIRONOLACTONE 12.5 MG HALF TABLET
12.5000 mg | ORAL_TABLET | Freq: Every day | ORAL | Status: DC
  Administered 2021-07-29 – 2021-07-30 (×2): 12.5 mg via ORAL
  Filled 2021-07-29 (×2): qty 1

## 2021-07-29 MED ORDER — DAPAGLIFLOZIN PROPANEDIOL 10 MG PO TABS
10.0000 mg | ORAL_TABLET | Freq: Every day | ORAL | Status: DC
  Administered 2021-07-30: 10 mg via ORAL
  Filled 2021-07-29 (×2): qty 1

## 2021-07-29 MED ORDER — SACUBITRIL-VALSARTAN 24-26 MG PO TABS
1.0000 | ORAL_TABLET | Freq: Two times a day (BID) | ORAL | Status: DC
  Administered 2021-07-29 – 2021-07-30 (×3): 1 via ORAL
  Filled 2021-07-29 (×4): qty 1

## 2021-07-29 MED ORDER — ATORVASTATIN CALCIUM 80 MG PO TABS
80.0000 mg | ORAL_TABLET | Freq: Every day | ORAL | Status: DC
  Administered 2021-07-29 – 2021-07-30 (×2): 80 mg via ORAL
  Filled 2021-07-29 (×2): qty 1

## 2021-07-29 NOTE — Progress Notes (Addendum)
Heart Failure Stewardship Pharmacist Progress Note   PCP: Johny Blamer, MD PCP-Cardiologist: Chilton Si, MD    HPI:  76 yo F with PMH of HFrEF, afib, CAD, HTN, HLD, stroke, T2DM, GERD, and fibromyalgia.   She was admitted 07/2020 with stroke. She was found to have 75% stenosis of the right PCA with severe distal left P2 stenosis.  MRI of the brain showed moderate acute right PCA infarct and underlying chronic microvascular ischemic disease. Echo that admission revealed LVEF 35 to 40%. She underwent left heart cath which revealed 100% occlusion of the mid LAD and D2. She had left to left and right to left collaterals.  She also had 50% stenosis in the mid LAD and 70% distal OM 3 stenoses. She had 60% stenosis in the RCA. There was concern for apical thrombus as the source of her embolic stroke. She was discharged with a 30-day monitor. Outpatient cardiac MRI was recommended to rule out LV thrombus. Cardiac MRI 10/2020 revealed LVEF 38% with LGE consistent with prior infarction.  There was no LV thrombus. She then saw outpatient cardiology x 3 with the last follow up in 02/2021.   She presented to the ED on 7/6 with sudden onset shortness of breath and severe respiratory distress. When she was en route to the ED, she went entirely unresponsive and had a brief PEA arrest. She was then emergently intubated in the ED and required vasopressors for hypotension. CXR with diffuse interstitial opacities with suspected pulmonary edema vs infection. She was started on empiric antibiotics for suspected CAP. ECHO on 7/6 showed LVEF 20-25%, G2DD, RV mildly reduced, and a small pericardial effusion was present. She was then extubated on 7/9.   Current HF Medications: ACE/ARB/ARNI: Entresto 24/26 mg BID MRA: spironolactone 12.5 mg daily SGLT2i: Farxiga 10 mg daily  Prior to admission HF Medications: Diuretic: furosemide 40 mg daily Beta blocker: carvedilol 3.125 mg 1/2 tablet BID ACE/ARB/ARNI: irbesartan  37.5 mg daily  Pertinent Lab Values: Serum creatinine 1.41, BUN 30, Potassium 4.0, Sodium 146, BNP 450.3, Magnesium 2.0, A1c 7.8   Vital Signs: Weight: 160 lbs (admission weight: 173 lbs) Blood pressure: 110-130/50s  Heart rate: 50-60s - NSR I/O: - yesterday; net -6.6L  Medication Assistance / Insurance Benefits Check: Does the patient have prescription insurance?  Yes Type of insurance plan: AARP Medicare  Does the patient qualify for medication assistance through manufacturers or grants?   Pending Eligible grants and/or patient assistance programs: Clifton Custard Medication assistance applications in progress: Clifton Custard  Medication assistance applications approved: none Approved medication assistance renewals will be completed by: pending  Outpatient Pharmacy:  Prior to admission outpatient pharmacy: [facility] Is the patient willing to use [facility] TOC pharmacy at discharge? Yes Is the patient willing to transition their outpatient pharmacy to utilize a [facility] outpatient pharmacy?   Pending    Assessment: 1. Acute on chronic systolic CHF (LVEF 20-25%), due to ICM. NYHA class II symptoms. - Not overtly overloaded on exam - no diuretics - No BB with bradycardia - Agree with starting Entresto 24/26 mg BID today - Agree with starting spironolactone 12.5 mg daily today - Agree with starting Farxiga 10 mg daily tomorrow   Plan: 1) Medication changes recommended at this time: - Agree with changes  2) Patient assistance: - Sherryll Burger and [place] assistance applications initiated  3)  Education  - Patient has been educated on current HF medications and potential additions to HF medication regimen - Patient verbalizes understanding that over the next  few months, these medication doses may change and more medications may be added to optimize HF regimen - Patient has been educated on basic disease state pathophysiology and goals of therapy   Sharen Hones, PharmD,  BCPS Heart Failure Stewardship Pharmacist Phone [phone]

## 2021-07-29 NOTE — Progress Notes (Signed)
Heart Failure Nurse Navigator Progress Note  PCP: Johny Blamer, MD PCP-Cardiologist: Duke Salvia Admission Diagnosis: Acute hypercapnic respiratory failure, Acute hypoxemic respiratory failure, Acute pulmonary edema.  Admitted from: Home via EMS  Presentation:   Yolanda Saunders presented with sudden onset of shortness of breath, respiratory distress. In route via EMS went unresponsive, intubated and started on vasopressors. CXR suspected pulmonary edema vs infection. BP 95/60, HR 58, pitting edema to bilateral ankles, BNP 450.3, Lactic acid 4.1, troponin 65, admitted to 2 H ICU.   Patient and Niece ( caregiver, [employer] POA) were educated on the sign and symptoms of heart failure, daily weights, when to call her doctor or go to the ER. Diet/ fluid restrictions, ( patient stated she likes soups, and some hotdogs, and drinks about 3 diet pepsi a day) Taking all her medication as prescribed and attending all her doctor appointments.Patient stated that she lives with her one niece and she stays with her during the day, and then her other niece will stay with her at night. They drive her to all her appointments.  Patient is scheduled for a HF TOC hospital follow up on 08/06/2021 @ 2 pm.   ECHO/ LVEF: 20-25% G2DD  Clinical Course:  Past Medical History:  Diagnosis Date   Anemia 04/17/11   "long, long years ago"   Angina 04/16/11   "that's what I'm here for"   Anxiety    Arthritis    Hips and knees   Asthma    Carpal tunnel syndrome    Cataracts, bilateral    Chronic combined systolic and diastolic heart failure (HCC) 08/07/2020   CKD (chronic kidney disease), stage III (HCC)    Complication of anesthesia 1987   "affected my eyes; couldn't see anything but blurrs even the next day"   CVA (cerebral infarction)    After cardiac catheter 02/2000   Depression    Edema    Fibromyalgia    GERD (gastroesophageal reflux disease)    Headache(784.0)    High cholesterol    History of bronchitis     Hypertension    IBS (irritable bowel syndrome)    Migraines    Panic attacks 04/17/11   "don't take anything for it"   Pneumonia 04/17/11   "probably as many as 7 times"   Renal disorder 04/17/11   "they are working at 60% capacity"   Shortness of breath on exertion    "cause of my asthma"   Stroke (HCC) 2002   residual "problem w/using the right word, left 5 lesions on my brain/MRI; long term memory loss"   Type II diabetes mellitus (HCC)      Social History   Socioeconomic History   Marital status: Widowed    Spouse name: Not on file   Number of children: 2   Years of education: Not on file   Highest education level: Not on file  Occupational History   Occupation: retired  Tobacco Use   Smoking status: Former    Packs/day: 0.75    Years: 6.00    Total pack years: 4.50    Types: Cigarettes    Quit date: 01/21/1980    Years since quitting: 41.5   Smokeless tobacco: Never  Vaping Use   Vaping Use: Not on file  Substance and Sexual Activity   Alcohol use: No    Alcohol/week: 0.0 standard drinks of alcohol   Drug use: No   Sexual activity: Not Currently  Other Topics Concern   Not on file  Social History Narrative   Lives with husband in a one story home.  Has 2 children.  Retired from [employer].  Education: 12th grade.  Trade schools.    Social Determinants of Health   Financial Resource Strain: Low Risk  (07/29/2021)   Overall Financial Resource Strain (CARDIA)    Difficulty of Paying Living Expenses: Not hard at all  Food Insecurity: No Food Insecurity (07/29/2021)   Hunger Vital Sign    Worried About Running Out of Food in the Last Year: Never true    Ran Out of Food in the Last Year: Never true  Transportation Needs: No Transportation Needs (07/29/2021)   PRAPARE - Administrator, Civil Service (Medical): No    Lack of Transportation (Non-Medical): No  Physical Activity: Not on file  Stress: Not on file  Social Connections: Not on file    Education Assessment and Provision:  Detailed education and instructions provided on heart failure disease management including the following:  Signs and symptoms of Heart Failure When to call the physician Importance of daily weights Low sodium diet Fluid restriction Medication management Anticipated future follow-up appointments  Patient education given on each of the above topics.  Patient acknowledges understanding via teach back method and acceptance of all instructions.  Education Materials:  "Living Better With Heart Failure" Booklet, HF zone tool, & Daily Weight Tracker Tool.  Patient has scale at home: yes Patient has pill box at home: NA     High Risk Criteria for Readmission and/or Poor Patient Outcomes: Heart failure hospital admissions (last 6 months): 0  No Show rate: 1 % Difficult social situation: No Demonstrates medication adherence: Yes Primary Language: English Literacy level: Can read and write, Difficulty now after her stroke, legally blind in her left eye.   Barriers of Care:   Diet/ fluid restrictions ( soups, hotdogs, soda, friends/ family cook for her) Daily weights  Considerations/Referrals:   Referral made to Heart Failure Pharmacist Stewardship: Yes Referral made to Heart Failure CSW/NCM TOC: No Referral made to Heart & Vascular TOC clinic: Yes, 08/06/21 @ 2 pm.  Items for Follow-up on DC/TOC: Diet/ fluid restrictions ( soups, soda, hotdogs, friends/family cook for her) Daily weights   Rhae Hammock, BSN, RN Heart Failure Teacher, adult education Only

## 2021-07-29 NOTE — Telephone Encounter (Signed)
Pharmacy Patient Advocate Encounter  Insurance verification completed.    The patient is insured through [employer] Part D   The patient is currently admitted and ran test claims for the following: [place] and Entresto.  Copays and coinsurance results were relayed to Inpatient clinical team.

## 2021-07-29 NOTE — Progress Notes (Signed)
ANTICOAGULATION CONSULT NOTE - Initial Consult  Pharmacy Consult for Eliquis Indication: atrial fibrillation  Allergies  Allergen Reactions   Codeine Other (See Comments)    "makes me crazy; see things; delusional"   Erythromycin Rash and Other (See Comments)    Peeling skin Skin turned "purplish"    Penicillins Rash and Other (See Comments)    Skin blisters   Shellfish Allergy Anaphylaxis and Swelling    Throat swells   Sulfonamide Derivatives Hives   Zestril [Lisinopril] Cough   Toprol Xl [Metoprolol] Diarrhea and Nausea And Vomiting    Rapid weight gain Hallucinations    Actos [Pioglitazone] Other (See Comments)    Upset GI   Aldactone [Spironolactone] Other (See Comments)    Unknown reaction   Amaryl [Glimepiride] Other (See Comments)    Upset GI   Apresoline [Hydralazine] Other (See Comments)    Unknown reaction   Benicar [Olmesartan] Other (See Comments)    Dizziness Headaches   Byetta 10 Mcg Pen [Exenatide] Nausea And Vomiting   Catapres [Clonidine Hcl] Other (See Comments)    Unknown reaction   Flexeril [Cyclobenzaprine] Other (See Comments)    Drowsiness    Glucotrol [Glipizide] Other (See Comments)    Upset GI   Glyburide-Metformin Other (See Comments)    Myalgias   Humalog [Insulin Lispro] Other (See Comments)    Headache Severe hyperglycemia   Invokana [Canagliflozin] Other (See Comments)    Yeast infections   Januvia [Sitagliptin] Other (See Comments)    UTI   Neurontin [Gabapentin] Other (See Comments)    Anxiety    Norvasc [Amlodipine] Other (See Comments)    Syncope   Prednisone Other (See Comments)    Unknown reaction   Propoxyphene Other (See Comments)    Unknown reaction Darvocet-N   Reglan [Metoclopramide] Other (See Comments)    Unknown reaction   Septra [Sulfamethoxazole-Trimethoprim] Hives   Ultram [Tramadol] Other (See Comments)    Anxiety    Victoza [Liraglutide] Diarrhea    Upset GI   Zocor [Simvastatin] Other (See Comments)     Myalgias    Cozaar [Losartan Potassium] Rash    Upset GI   Novolin 70-30 [Insulin Nph Isophane & Regular] Swelling and Rash   Sulfa Antibiotics Hives and Rash    Patient Measurements: Height: 5\' 3"  (160 cm) Weight: 72.9 kg (160 lb 11.5 oz) IBW/kg (Calculated) : 52.4   Vital Signs: Temp: 98 F (36.7 C) (07/10 0759) Temp Source: Oral (07/10 0759) BP: 137/68 (07/10 0700) Pulse Rate: 61 (07/10 0700)  Labs: Recent Labs    07/27/21 0348 07/28/21 0059 07/28/21 1114 07/29/21 0053  HGB 10.2* 10.0* 13.3 9.8*  HCT 30.7* 31.4* 39.0 30.6*  PLT 123* 125*  --  126*  CREATININE 1.68* 1.45*  --  1.41*    Estimated Creatinine Clearance: 32 mL/min (A) (by C-G formula based on SCr of 1.41 mg/dL (H)).   Medical History: Past Medical History:  Diagnosis Date   Anemia 04/17/11   "long, long years ago"   Angina 04/16/11   "that's what I'm here for"   Anxiety    Arthritis    Hips and knees   Asthma    Carpal tunnel syndrome    Cataracts, bilateral    Chronic combined systolic and diastolic heart failure (HCC) 08/07/2020   CKD (chronic kidney disease), stage III (HCC)    Complication of anesthesia 1987   "affected my eyes; couldn't see anything but blurrs even the next day"   CVA (cerebral infarction)  After cardiac catheter 02/2000   Depression    Edema    Fibromyalgia    GERD (gastroesophageal reflux disease)    Headache(784.0)    High cholesterol    History of bronchitis    Hypertension    IBS (irritable bowel syndrome)    Migraines    Panic attacks 04/17/11   "don't take anything for it"   Pneumonia 04/17/11   "probably as many as 7 times"   Renal disorder 04/17/11   "they are working at 60% capacity"   Shortness of breath on exertion    "cause of my asthma"   Stroke (HCC) 2002   residual "problem w/using the right word, left 5 lesions on my brain/MRI; long term memory loss"   Type II diabetes mellitus (HCC)     Assessment: 76 yo female on chronic Eliquis for  afib PTA.  Pharmacy asked to resume today.  Goal of Therapy:   Monitor platelets by anticoagulation protocol: Yes   Plan:  Eliquis 5 mg po BID.  Reece Leader, Colon Flattery, BCCP Clinical Pharmacist  07/29/2021 8:31 AM   [facility] pharmacy phone numbers are listed on [email or web address]

## 2021-07-29 NOTE — TOC Benefit Eligibility Note (Signed)
Patient Advocate Encounter ? ?Insurance verification completed.   ? ?The patient is currently admitted and upon discharge could be taking Entresto 24-26 mg. ? ?The current 30 day co-pay is, $47.00.  ? ?The patient is currently admitted and upon discharge could be taking Farxiga 10 mg. ? ?The current 30 day co-pay is, $47.00. ? ?The patient is insured through [employer] Medicare Part D  ? ? ?Yolanda Saunders, CPhT ?Pharmacy Patient Advocate Specialist ?[facility] Patient Advocate Team ?Direct Number: [phone]  Fax: [phone] ? ? ? ? ? ?  ?

## 2021-07-29 NOTE — Evaluation (Signed)
Occupational Therapy Evaluation Patient Details Name: Yolanda Saunders MRN: [id number] DOB: July 07, 1945 Today's Date: 07/29/2021   History of Present Illness Yolanda Saunders is a 76 year old woman admitted 07/25/21 due to SOB and hypoxia, LOC that progressed to 45-second PEA cardiac arrest w/bag-valve-mask and chest compressions ROSC, placed on BiPAP, had an episode of agitation and vomiting into the BiPAP mask at which time she was intubated 7/6-9. PMH includes Cataracts, bilateral, Chronic combined systolic and diastolic heart failure (08/07/2020), CKD stage III, C, CVA ( Blind in L eye), Depression, Fibromyalgia, HTN, IBS, Migraines, Shortness of breath on exertion.   Clinical Impression   Pt is typically modified independent in ADL (bathing/dressing etc), does not drive and has L sided blindness after her CVA July 2022. So her niece assists with IADL (medicine management, cooking, cleaning), has another niece that stays with her at night. Today OT entered as family was taking her to the bathroom. Min A for in room mobility for balance and environment navigation. She was able to perform peri care seated, and standing grooming with min guard. Once seated in the recliner, she demoed LB dressing, and multiple sit<>stands. HR was in the 60's throughout and SpO2 was >96% throughout on RA. OT provided handout for [facility] and Energy conservation handout (verbally reviewed). At this time OT will follow acutely to maximize safety and independence in ADL and functional transfers with a focus on visual compensatory strategies. Do not anticipate the need for post-acute OT at this time.     Recommendations for follow up therapy are one component of a multi-disciplinary discharge planning process, led by the attending physician.  Recommendations may be updated based on patient status, additional functional criteria and insurance authorization.   Follow Up Recommendations  No OT follow up     Assistance Recommended at Discharge Frequent or constant Supervision/Assistance  Patient can return home with the following A little help with walking and/or transfers;A little help with bathing/dressing/bathroom;Assistance with cooking/housework;Direct supervision/assist for medications management;Direct supervision/assist for financial management;Assist for transportation;Help with stairs or ramp for entrance    Functional Status Assessment  Patient has had a recent decline in their functional status and demonstrates the ability to make significant improvements in function in a reasonable and predictable amount of time.  Equipment Recommendations  None recommended by OT (Pt has appropriate DME)    Recommendations for Other Services Other (comment) ([facility], HH Aide)     Precautions / Restrictions Precautions Precaution Comments: blind in L eye Restrictions Weight Bearing Restrictions: No      Mobility Bed Mobility               General bed mobility comments: walking to bathroom at beginning of session, and in chair at EOS    Transfers Overall transfer level: Needs assistance Equipment used: 1 person hand held assist Transfers: Sit to/from Stand Sit to Stand: Min assist           General transfer comment: assist for balance and enviroment navigation      Balance Overall balance assessment: Mild deficits observed, not formally tested                                         ADL either performed or assessed with clinical judgement   ADL Overall ADL's : Needs assistance/impaired Eating/Feeding: Set up;Sitting   Grooming: Wash/dry hands;Min guard;Standing  Grooming Details (indicate cue type and reason): sink level Upper Body Bathing: Moderate assistance;Sitting   Lower Body Bathing: Set up;Sitting/lateral leans Lower Body Bathing Details (indicate cue type and reason): able to perform figure 4 Upper Body Dressing : Minimal  assistance;Sitting   Lower Body Dressing: Min guard;Sit to/from stand Lower Body Dressing Details (indicate cue type and reason): able to perform figure 4 Toilet Transfer: Minimal assistance;Ambulation Toilet Transfer Details (indicate cue type and reason): one person HHA, mostly to assist with vision deficits Toileting- Clothing Manipulation and Hygiene: Set up;Sitting/lateral lean Toileting - Clothing Manipulation Details (indicate cue type and reason): doing own peri care in sitting Tub/ Shower Transfer: Minimal assistance;Ambulation Tub/Shower Transfer Details (indicate cue type and reason): able to step over simulated small threshold Functional mobility during ADLs: Minimal assistance (1 person HHA) General ADL Comments: limited by L eye blind in unfamiliar environment, pain due to compression so more assist needed for some aspects of bathing/dressing. Neice present and willing/able. Provided energy conservation handout, verbally reviewed     Vision Baseline Vision/History: 2 Legally blind (L eye) Ability to See in Adequate Light: 1 Impaired Patient Visual Report: No change from baseline Additional Comments: recommended looking into services for the blind     Perception     Praxis      Pertinent Vitals/Pain Pain Assessment Pain Assessment: Faces Faces Pain Scale: Hurts a little bit Pain Location: chest from compressions Pain Descriptors / Indicators: Discomfort, Grimacing Pain Intervention(s): Monitored during session, Repositioned     Hand Dominance Right   Extremity/Trunk Assessment Upper Extremity Assessment Upper Extremity Assessment: Overall WFL for tasks assessed;Generalized weakness   Lower Extremity Assessment Lower Extremity Assessment: Overall WFL for tasks assessed;Generalized weakness   Cervical / Trunk Assessment Cervical / Trunk Assessment: Other exceptions (hx of back sx)   Communication Communication Communication: No difficulties   Cognition  Arousal/Alertness: Awake/alert Behavior During Therapy: WFL for tasks assessed/performed Overall Cognitive Status: Within Functional Limits for tasks assessed                                       General Comments  HR in the 60's throughout, SpO2 96% on RA during mobility and while sitting in the chair. neice present who cares for her during the day. She was asking about [facility] aide to stay with Yolanda Saunders when she (the neice) has healthcare appointments    Exercises     Shoulder Instructions      Home Living Family/patient expects to be discharged to:: Private residence Living Arrangements: Other relatives Available Help at Discharge: Family;Available 24 hours/day Type of Home: House Home Access: Stairs to enter [employer] of Steps: 6 Entrance Stairs-Rails: Right;Left;Can reach both Home Layout: One level     Bathroom Shower/Tub: Occupational psychologist: Standard Bathroom Accessibility: Yes   Home Equipment: Shower seat;Hand held shower head;Grab bars - tub/shower;Rolling Environmental consultant (2 wheels);Rollator (4 wheels)   Additional Comments: neice stays with her at night, other neice stays during the day      Prior Functioning/Environment Prior Level of Function : Needs assist             Mobility Comments: typically does not use DME in home, Rollator for public ADLs Comments: due to vision loss, neice does all medication management and cooking/cleaning. She does her own bathing/dressing/self feeding        OT Problem List: Decreased activity  tolerance;Impaired vision/perception;Decreased safety awareness      OT Treatment/Interventions:      OT Goals(Current goals can be found in the care plan section) Acute Rehab OT Goals Patient Stated Goal: get back to getting out and eating at Yolanda Saunders OT Goal Formulation: With patient/family Time For Goal Achievement: 08/12/21 Potential to Achieve Goals: Good  OT Frequency:       Co-evaluation              AM-PAC OT "6 Clicks" Daily Activity     Outcome Measure Help from another person eating meals?: None Help from another person taking care of personal grooming?: A Little Help from another person toileting, which includes using toliet, bedpan, or urinal?: A Little Help from another person bathing (including washing, rinsing, drying)?: A Little Help from another person to put on and taking off regular upper body clothing?: A Little Help from another person to put on and taking off regular lower body clothing?: A Little 6 Click Score: 19   End of Session Nurse Communication: Mobility status;Precautions  Activity Tolerance: Patient tolerated treatment well Patient left: in chair;with call bell/phone within reach;with family/visitor present  OT Visit Diagnosis: Muscle weakness (generalized) (M62.81);Low vision, both eyes (H54.2)                Time: 2637-8588 OT Time Calculation (min): 25 min Charges:  OT General Charges $OT Visit: 1 Visit OT Evaluation $OT Eval Moderate Complexity: 1 Mod OT Treatments $Self Care/Home Management : 8-22 mins Nyoka Cowden OTR/L Acute Rehabilitation Services Office: [phone]   [name] [facility] 07/29/2021, 11:36 AM

## 2021-07-29 NOTE — Progress Notes (Signed)
NAME:  Yolanda Saunders, MRN:  [id number], DOB:  08-24-1945, LOS: 4 ADMISSION DATE:  07/25/2021, CONSULTATION DATE: 7/623 REFERRING MD: Janann Colonel, CHIEF COMPLAINT: Respiratory failure  History of Present Illness:  Mrs. Lax is a 76 year old woman with a history of HFrEF, stroke, type 2 diabetes who presented after waking up this morning with sudden onset shortness of breath.  She had been in her usual state of health yesterday, feeling very well.  She had minimal coughing last night, but otherwise her family reports that she had no recent symptoms other than ankle swelling.  She had been taking extra Lasix to address the ankle swelling.  She was started on gabapentin about a week ago with some dizziness since this started.  Due to her shortness of breath this morning her family called EMS.  When they arrived she was saturating in the 50s with a respiratory rate around 50.  She was placed on CPAP for transport to the emergency department.  With EMS she had reduced loss of consciousness that progressed to a 45-second PEA cardiac arrest.  With bag-valve-mask and chest compressions ROSC was achieved prior to administration of ACLS medications.  She was awake in the emergency department and initially placed on BiPAP.  She had an episode of agitation and vomiting into the BiPAP mask at which time she was emergently intubated.  She is on propofol for sedation and now requiring norepinephrine for hypotension.  Her family was able to provide additional history.  She recently has updated her advance directives, which they will bring a copy of to the hospital.  She reports her PCP at [facility] has a copy as well.  Family said she is DNR and she would only want aggressive healthcare measures such as intubation for 3 days max.  After that she would want to be kept comfortable.  Although family would use differently, they are very respectful of her wishes.  They admit that she prioritizes her quality of life.  Her  husband passed away in the past year, which has been very hard for her.  Pertinent  Medical History  HfpEF Fibromyalgia Type 2 diabetes GERD Stroke  Significant Hospital Events: Including procedures, antibiotic start and stop dates in addition to other pertinent events   7/6 admitted, intubated 7/6 Echocardiogram: EF 25% with global hypokinesis. Grade II diastolic dysfunction. EF has decreased since 2022.  7/6 No evidence of DVT by Doppler.  7/6 CXR consistent with CHF 7/9 Extubated.   Interim History / Subjective:  Extubated yesterday. Slept well on BiPAP. Denies chest pain or dyspnea but still feels tired.   Objective   Blood pressure 137/68, pulse 61, temperature 98 F (36.7 C), temperature source Oral, resp. rate 14, height 5\' 3"  (1.6 m), weight 72.9 kg, SpO2 100 %.    Vent Mode: PSV;CPAP FiO2 (%):  [30 %-40 %] 30 % Set Rate:  [15 bmp] 15 bmp Vt Set:  [410 mL] 410 mL PEEP:  [5 cmH20] 5 cmH20 Pressure Support:  [5 cmH20] 5 cmH20   Intake/Output Summary (Last 24 hours) at 07/29/2021 0801 Last data filed at 07/29/2021 0400 Gross per 24 hour  Intake --  Output 875 ml  Net -875 ml    Filed Weights   07/27/21 0500 07/28/21 0500 07/29/21 0500  Weight: 75.8 kg 73.2 kg 72.9 kg    Examination: General: sitting in bed in no distress.  HENT: No pressure injuries. Lungs: Chest clear bilaterally.  No adventitial sounds.   Cardiovascular: JVP at 3cm ASA  with no HJR. S3 gallop.  No murmurs. Abdomen: Soft, nontender, hypoactive bowel sounds Extremities: No ankle edema.  Neuro: No focal deficits.  Derm: Warm, dry, no diffuse rashes  Ancillary tests personally reviewed:   Creatinine 1.41  Assessment & Plan:   Critically ill due to acute hypoxic respiratory failure requiring mechanical ventilation due to acute decompensated mixed HF. Sedation related hypotension.  Possible CAP. DVT has been ruled out. Known ischemic cardiomyopathy stage D Type 2 DM stress hyperglycemia.    Plan:   - Doing well following extubation. Patient likely has combination of systolic heart failure and resultant sleep disordered breathing. - Presently appears well compensated and is ready for transfer to the floor.  - Replace home Irbesartan with Entresto.  - Start spironolactone. - Clinically euvolemic now. Would replace home furosemide with better absorbed torsemide (start in 48h)  - Start [place] tomorrow.  - Continue BiPAP at nighttime - outpatient sleep study ordered  Ready for transfer, orders reconciled and TRH notified.   Best Practice (right click and "Reselect all SmartList Selections" daily)   Diet/type: regular  DVT prophylaxis: restart home Eliquis.  GI prophylaxis: PPI Lines: N/A Foley:  N/A Code Status:  DNR Last date of multidisciplinary goals of care discussion [family updated in the ED 7/10]  Lynnell Catalan, MD [facility] ICU Physician [facility]  Pager: [phone] Mobile: [phone] After hours: [phone].

## 2021-07-30 ENCOUNTER — Other Ambulatory Visit (HOSPITAL_COMMUNITY): Payer: Self-pay

## 2021-07-30 DIAGNOSIS — J9601 Acute respiratory failure with hypoxia: Secondary | ICD-10-CM | POA: Diagnosis present

## 2021-07-30 DIAGNOSIS — J810 Acute pulmonary edema: Secondary | ICD-10-CM

## 2021-07-30 DIAGNOSIS — I5023 Acute on chronic systolic (congestive) heart failure: Secondary | ICD-10-CM

## 2021-07-30 LAB — GLUCOSE, CAPILLARY: Glucose-Capillary: 143 mg/dL — ABNORMAL HIGH (ref 70–99)

## 2021-07-30 LAB — CULTURE, RESPIRATORY W GRAM STAIN: Gram Stain: NONE SEEN

## 2021-07-30 LAB — CULTURE, BLOOD (ROUTINE X 2)
Culture: NO GROWTH
Culture: NO GROWTH
Special Requests: ADEQUATE

## 2021-07-30 MED ORDER — TORSEMIDE 10 MG PO TABS
10.0000 mg | ORAL_TABLET | Freq: Every day | ORAL | 1 refills | Status: DC
  Filled 2021-07-30: qty 30, 30d supply, fill #0

## 2021-07-30 MED ORDER — SPIRONOLACTONE 25 MG PO TABS
12.5000 mg | ORAL_TABLET | Freq: Every day | ORAL | 1 refills | Status: AC
  Filled 2021-07-30: qty 30, 60d supply, fill #0

## 2021-07-30 MED ORDER — INSULIN GLARGINE-YFGN 100 UNIT/ML ~~LOC~~ SOLN
10.0000 [IU] | Freq: Every day | SUBCUTANEOUS | Status: DC
  Administered 2021-07-30: 10 [IU] via SUBCUTANEOUS
  Filled 2021-07-30: qty 0.1

## 2021-07-30 MED ORDER — DAPAGLIFLOZIN PROPANEDIOL 10 MG PO TABS
10.0000 mg | ORAL_TABLET | Freq: Every day | ORAL | 1 refills | Status: DC
  Filled 2021-07-30: qty 30, 30d supply, fill #0
  Filled 2021-07-30: qty 30, 30d supply, fill #1

## 2021-07-30 MED ORDER — SACUBITRIL-VALSARTAN 24-26 MG PO TABS
1.0000 | ORAL_TABLET | Freq: Two times a day (BID) | ORAL | 1 refills | Status: DC
  Filled 2021-07-30: qty 60, 30d supply, fill #0
  Filled 2021-07-30: qty 60, 30d supply, fill #1

## 2021-07-30 NOTE — TOC Transition Note (Addendum)
Transition of Care [facility]) - CM/SW Discharge Note   Patient Details  Name: SHYENNE MAGGARD MRN: [id number] Date of Birth: 03-Dec-1945  [facility]) CM/SW Contact:  Leone Haven, RN Phone Number: 07/30/2021, 11:10 AM   Clinical Narrative:    Patient is for dc today, MD ordered bipap for patient, but daughter states since she did not use oxygen last pm and they think it was just exacerbation she will not need bipap right away.  Zach with [employer] spoke with daughter, Lorene Dy, who is the POA, and she would like for him to work on finding out if patient will qualify for the bipap etc.  She gave Ian Malkin her phone number to contact her.  Lorene Dy will transport her home today. Per Ian Malkin , patient has no qualifying criteria for bipap so best to follow up outpatient, informed daughter of the information.         Patient Goals and CMS Choice        Discharge Placement                       Discharge Plan and Services                                     Social Determinants of Health (SDOH) Interventions Food Insecurity Interventions: Intervention Not Indicated Financial Strain Interventions: Intervention Not Indicated Housing Interventions: Intervention Not Indicated Transportation Interventions: Intervention Not Indicated   Readmission Risk Interventions     No data to display

## 2021-07-30 NOTE — Progress Notes (Signed)
PT Cancellation Note  Patient Details Name: Yolanda Saunders MRN: [id number] DOB: [birth year]   Cancelled Treatment:    Reason Eval/Treat Not Completed: PT screened, no needs identified, will sign off.  Niece present and pt and niece state that pt is at her baseline. OT saw pt as well and addressed ADLS and mobility with pt.  Pt has ambulated in hall and does not have LOB.  Will not evaluate pt as she has no PT needs and will not need f/u.  Pt has equipment if she needs it in future.    Bevelyn Buckles 07/30/2021, 11:51 AM Daltin Crist M,PT Acute Rehab Services [phone]

## 2021-07-30 NOTE — Discharge Instructions (Signed)

## 2021-07-30 NOTE — Progress Notes (Signed)
Pt refused CPAP. She says she cannot tolerate mask. 97% on RA. Notified the nurse to call me if she needs it during the night.

## 2021-07-30 NOTE — Plan of Care (Signed)
  Problem: Coping: Goal: Ability to adjust to condition or change in health will improve Outcome: Progressing   Problem: Fluid Volume: Goal: Ability to maintain a balanced intake and output will improve Outcome: Progressing   Problem: Clinical Measurements: Goal: Diagnostic test results will improve Outcome: Progressing Goal: Respiratory complications will improve Outcome: Progressing Goal: Cardiovascular complication will be avoided Outcome: Progressing   Problem: Coping: Goal: Level of anxiety will decrease Outcome: Progressing   Problem: Pain Managment: Goal: General experience of comfort will improve Outcome: Progressing   Problem: Safety: Goal: Ability to remain free from injury will improve Outcome: Progressing

## 2021-07-30 NOTE — Progress Notes (Signed)
Heart Failure Stewardship Pharmacist Progress Note   PCP: Johny Blamer, MD PCP-Cardiologist: Chilton Si, MD    HPI:  76 yo F with PMH of HFrEF, afib, CAD, HTN, HLD, stroke, T2DM, GERD, and fibromyalgia.   She was admitted 07/2020 with stroke. She was found to have 75% stenosis of the right PCA with severe distal left P2 stenosis.  MRI of the brain showed moderate acute right PCA infarct and underlying chronic microvascular ischemic disease. Echo that admission revealed LVEF 35 to 40%. She underwent left heart cath which revealed 100% occlusion of the mid LAD and D2. She had left to left and right to left collaterals.  She also had 50% stenosis in the mid LAD and 70% distal OM 3 stenoses. She had 60% stenosis in the RCA. There was concern for apical thrombus as the source of her embolic stroke. She was discharged with a 30-day monitor. Outpatient cardiac MRI was recommended to rule out LV thrombus. Cardiac MRI 10/2020 revealed LVEF 38% with LGE consistent with prior infarction.  There was no LV thrombus. She then saw outpatient cardiology x 3 with the last follow up in 02/2021.   She presented to the ED on 7/6 with sudden onset shortness of breath and severe respiratory distress. When she was en route to the ED, she went entirely unresponsive and had a brief PEA arrest. She was then emergently intubated in the ED and required vasopressors for hypotension. CXR with diffuse interstitial opacities with suspected pulmonary edema vs infection. She was started on empiric antibiotics for suspected CAP. ECHO on 7/6 showed LVEF 20-25%, G2DD, RV mildly reduced, and a small pericardial effusion was present. She was then extubated on 7/9. Transferred to the floor 7/10.  Current HF Medications: ACE/ARB/ARNI: Entresto 24/26 mg BID MRA: spironolactone 12.5 mg daily SGLT2i: Farxiga 10 mg daily  Prior to admission HF Medications: Diuretic: furosemide 40 mg daily Beta blocker: carvedilol 3.125 mg 1/2 tablet  BID ACE/ARB/ARNI: irbesartan 37.5 mg daily  Pertinent Lab Values: As of 7/10: Serum creatinine 1.41, BUN 30, Potassium 4.0, Sodium 146, BNP 450.3, Magnesium 2.0, A1c 7.8   Vital Signs: Weight: 163 lbs (admission weight: 173 lbs) Blood pressure: 120/50s  Heart rate: 50-60s - NSR I/O: - yesterday; net -6.5L  Medication Assistance / Insurance Benefits Check: Does the patient have prescription insurance?  Yes Type of insurance plan: AARP Medicare  Does the patient qualify for medication assistance through manufacturers or grants?   Pending Eligible grants and/or patient assistance programs: [place] Medication assistance applications in progress: [employer]  Medication assistance applications approved: none Approved medication assistance renewals will be completed by: pending  Outpatient Pharmacy:  Prior to admission outpatient pharmacy: [facility] Is the patient willing to use [facility] TOC pharmacy at discharge? Yes Is the patient willing to transition their outpatient pharmacy to utilize a [facility] outpatient pharmacy?   Pending    Assessment: 1. Acute on chronic systolic CHF (LVEF 20-25%), due to ICM. NYHA class II symptoms. - Not overtly overloaded on exam - no diuretics - No BB with bradycardia - Continue Entresto 24/26 mg BID  - Continue spironolactone 12.5 mg daily  - Agree with starting Farxiga 10 mg daily today   Plan: 1) Medication changes recommended at this time: - Agree with changes; discharge today  2) Patient assistance: - Sherryll Burger and Farxiga assistance applications initiated  3)  Education  - Patient has been educated on current HF medications and potential additions to HF medication regimen - Patient verbalizes  understanding that over the next few months, these medication doses may change and more medications may be added to optimize HF regimen - Patient has been educated on basic disease state pathophysiology and goals of therapy   Sharen Hones, PharmD, BCPS Heart Failure Stewardship Pharmacist Phone [phone]

## 2021-07-30 NOTE — Discharge Summary (Addendum)
Physician Discharge Summary  Patient ID: Yolanda Saunders MRN: [id number] DOB/AGE: Oct 23, 1945 76 y.o.  Admit date: 07/25/2021 Discharge date: 07/30/2021  Admission Diagnoses:  Discharge Diagnoses:  Active Problems:   Type 2 diabetes mellitus with stage 3 chronic kidney disease, with long-term current use of insulin (HCC)   CKD (chronic kidney disease), stage III (HCC)   Acute hypoxemic respiratory failure (HCC)   Acute pulmonary edema (HCC)   Acute on chronic HFrEF (heart failure with reduced ejection fraction) (HCC)   Discharged Condition: good  Hospital Course: 76 year old woman who was admitted with increasing edema and dyspnea. No reported change in medications or non-compliance by history.  Initially attempted BiPAP but patient experienced apneic episodes and was intubated. Received IV diuretics and improved rapidly. Remained intubated for 48h further as she continued to have apneic spells on pressure support. Suspected that she had HF related sleep disordered breathing. She was also alkalotic. Eventually apnea spells improved and the patient was extubated and weaned rapidly to room air. Heart failure treatment was adjusted: she was started on Entresto and spironolactone and maintenance torsemide. Carvedilol was held as HR was around 60. She was able to ambulate and was requesting to go home.  She did not tolerate CPAP, so was not prescribed at discharge, but patient should have outpatient sleep study.  At discharge the patient and her daughter confirmed that she wished to not receive CPR in case of cardiac arrest but to otherwise receive aggressive care on a time limited basis. A MOLST form was completed and a copy was scanned into Epic.   Consults: pulmonary/intensive care  Significant Diagnostic Studies: cardiac graphics: Echocardiogram: showed worsening EF to 25% compared to prior.   Treatments: mechanical ventilation and diuresis  Discharge Exam: Blood pressure (!) 122/49,  pulse 60, temperature 98.4 F (36.9 C), temperature source Oral, resp. rate 19, height 5\' 3"  (1.6 m), weight 74 kg, SpO2 97 %. General appearance: alert, cooperative, and no distress Resp: clear to auscultation bilaterally Cardio: S3 present and no JVD Extremities: no edema, redness or tenderness in the calves or thighs Neurologic: Grossly normal  Disposition:   Discharge Instructions     Polysomnography 4 or more parameters   Complete by: Aug 29, 2021    Where should this test be performed: [facility] - Pulmonary      Allergies as of 07/30/2021       Reactions   Codeine Other (See Comments)   "makes me crazy; see things; delusional"   Erythromycin Rash, Other (See Comments)   Peeling skin Skin turned "purplish"    Penicillins Rash, Other (See Comments)   Skin blisters   Shellfish Allergy Anaphylaxis, Swelling   Throat swells   Sulfonamide Derivatives Hives   Zestril [lisinopril] Cough   Toprol Xl [metoprolol] Diarrhea, Nausea And Vomiting   Rapid weight gain Hallucinations    Actos [pioglitazone] Other (See Comments)   Upset GI   Aldactone [spironolactone] Other (See Comments)   Unknown reaction   Amaryl [glimepiride] Other (See Comments)   Upset GI   Apresoline [hydralazine] Other (See Comments)   Unknown reaction   Benicar [olmesartan] Other (See Comments)   Dizziness Headaches   Byetta 10 Mcg Pen [exenatide] Nausea And Vomiting   Catapres [clonidine Hcl] Other (See Comments)   Unknown reaction   Flexeril [cyclobenzaprine] Other (See Comments)   Drowsiness    Glucotrol [glipizide] Other (See Comments)   Upset GI   Glyburide-metformin Other (See Comments)   Myalgias   Humalog [  insulin Lispro] Other (See Comments)   Headache Severe hyperglycemia   Invokana [canagliflozin] Other (See Comments)   Yeast infections   Januvia [sitagliptin] Other (See Comments)   UTI   Neurontin [gabapentin] Other (See Comments)   Anxiety   Norvasc [amlodipine] Other (See Comments)    Syncope   Prednisone Other (See Comments)   Unknown reaction   Propoxyphene Other (See Comments)   Unknown reaction Darvocet-N   Reglan [metoclopramide] Other (See Comments)   Unknown reaction   Septra [sulfamethoxazole-trimethoprim] Hives   Ultram [tramadol] Other (See Comments)   Anxiety    Victoza [liraglutide] Diarrhea   Upset GI   Zocor [simvastatin] Other (See Comments)   Myalgias    Cozaar [losartan Potassium] Rash   Upset GI   Novolin 70-30 [insulin Nph Isophane & Regular] Swelling, Rash   Sulfa Antibiotics Hives, Rash        Medication List     STOP taking these medications    acetaminophen 325 MG tablet Commonly known as: TYLENOL   acetaminophen 500 MG tablet Commonly known as: TYLENOL   carvedilol 3.125 MG tablet Commonly known as: COREG   furosemide 40 MG tablet Commonly known as: LASIX   irbesartan 75 MG tablet Commonly known as: AVAPRO       TAKE these medications    atorvastatin 80 MG tablet Commonly known as: LIPITOR Take 1 tablet (80 mg total) by mouth daily.   dapagliflozin propanediol 10 MG Tabs tablet Commonly known as: FARXIGA Take 1 tablet (10 mg total) by mouth daily. Start taking on: July 31, 2021   Eliquis 5 MG Tabs tablet Generic drug: apixaban Take 1 tablet by mouth twice daily What changed: how much to take   gabapentin 100 MG capsule Commonly known as: NEURONTIN Take 100 mg by mouth at bedtime.   insulin glargine 100 UNIT/ML injection Commonly known as: Lantus Inject 0.1 mLs (10 Units total) into the skin daily.   insulin regular 100 units/mL injection Commonly known as: NOVOLIN R Inject 10-14 Units into the skin 3 (three) times daily before meals. Per sliding scale. OTC Novolin R.   pantoprazole 40 MG tablet Commonly known as: PROTONIX Take 1 tablet (40 mg total) by mouth daily.   potassium chloride SA 20 MEQ tablet Commonly known as: KLOR-CON M Take 20 meq daily for the next 4 days only    sacubitril-valsartan 24-26 MG Commonly known as: ENTRESTO Take 1 tablet by mouth 2 (two) times daily.   sertraline 50 MG tablet Commonly known as: ZOLOFT Take 50 mg by mouth daily.   spironolactone 25 MG tablet Commonly known as: ALDACTONE Take 0.5 tablets (12.5 mg total) by mouth daily. Start taking on: July 31, 2021   torsemide 10 MG tablet Commonly known as: DEMADEX Take 1 tablet (10 mg total) by mouth daily.   vitamin B-12 1000 MCG tablet Commonly known as: CYANOCOBALAMIN Take 1,000 mcg by mouth daily.   VITAMIN D-3 PO Take 1 capsule by mouth daily.               Durable Medical Equipment  (From admission, onward)           Start     Ordered   07/29/21 0753  For home use only DME Bipap  Once       Question:  Length of Need  Answer:  6 Months   07/29/21 7494            Follow-up Information     Chilton Si,  MD. Schedule an appointment as soon as possible for a visit in 2 week(s).   Specialty: Cardiology Contact information: [place] [address] [place]. Go in 8 day(s).   Specialty: Cardiology Why: Hospital follow up PLEASE bring a current medication list to appointment [place] [address], [address], Off of [employer]. Contact information: [id number] [place]        Shirline Frees, MD. Call in 1 week(s).   Specialty: Family Medicine Contact information: [place] [address] [phone]                1min spent on day of discharge.   SignedKipp Brood 07/30/2021, 9:48 AM

## 2021-07-30 NOTE — Progress Notes (Signed)
Occupational Therapy Treatment Patient Details Name: Yolanda Saunders MRN: [id number] DOB: August 02, 1945 Today's Date: 07/30/2021   History of present illness Yolanda Saunders is a 76 year old woman admitted 07/25/21 due to SOB and hypoxia, LOC that progressed to 45-second PEA cardiac arrest w/bag-valve-mask and chest compressions ROSC, placed on BiPAP, had an episode of agitation and vomiting into the BiPAP mask at which time she was intubated 7/6-9. PMH includes Cataracts, bilateral, Chronic combined systolic and diastolic heart failure (08/07/2020), CKD stage III, C, CVA ( Blind in L eye), Depression, Fibromyalgia, HTN, IBS, Migraines, Shortness of breath on exertion.   OT comments  Pt presented with niece in room and reviewed low vision and energy conservation techniques with the return home. Pt was able to ambulate with hand held assist and standing tasks due to low vision with HR in 70s with no LOB. Due to low vision educated about positioning/marking items. Pt currently with functional limitations due to the deficits listed below (see OT Problem List). Pt will benefit from skilled OT to increase their safety and independence with ADL and functional mobility for ADL to facilitate discharge to venue listed below.     Recommendations for follow up therapy are one component of a multi-disciplinary discharge planning process, led by the attending physician.  Recommendations may be updated based on patient status, additional functional criteria and insurance authorization.    Follow Up Recommendations  No OT follow up    Assistance Recommended at Discharge Frequent or constant Supervision/Assistance  Patient can return home with the following  A little help with walking and/or transfers;A little help with bathing/dressing/bathroom;Assistance with cooking/housework;Direct supervision/assist for medications management;Direct supervision/assist for financial management;Assist for transportation;Help with  stairs or ramp for entrance   Equipment Recommendations  None recommended by OT    Recommendations for Other Services      Precautions / Restrictions Precautions Precaution Comments: blind in L eye Restrictions Weight Bearing Restrictions: No       Mobility Bed Mobility Overal bed mobility: Modified Independent             General bed mobility comments: due to blind in L eye    Transfers Overall transfer level: Needs assistance Equipment used: 1 person hand held assist Transfers: Sit to/from Stand Sit to Stand: Supervision           General transfer comment: hand held assist due to low vision     Balance Overall balance assessment: Mild deficits observed, not formally tested                                         ADL either performed or assessed with clinical judgement   ADL Overall ADL's : Needs assistance/impaired Eating/Feeding: Set up;Sitting   Grooming: Wash/dry hands;Wash/dry face;Supervision/safety;Standing   Upper Body Bathing: Supervision/ safety;Cueing for safety;Cueing for sequencing;Sitting   Lower Body Bathing: Supervison/ safety;Sit to/from stand   Upper Body Dressing : Supervision/safety;Sitting   Lower Body Dressing: Supervision/safety;Sit to/from stand   Toilet Transfer: Min guard (hand held assist)   Toileting- Architect and Hygiene: Supervision/safety;Sit to/from stand       Functional mobility during ADLs: Supervision/safety      Extremity/Trunk Assessment Upper Extremity Assessment Upper Extremity Assessment: Overall WFL for tasks assessed   Lower Extremity Assessment Lower Extremity Assessment: Defer to PT evaluation        Vision       Perception  Praxis      Cognition Arousal/Alertness: Awake/alert Behavior During Therapy: WFL for tasks assessed/performed Overall Cognitive Status: Within Functional Limits for tasks assessed                                           Exercises      Shoulder Instructions       General Comments HR 70s    Pertinent Vitals/ Pain       Pain Assessment Pain Assessment: 0-10 Pain Score: 1  Pain Location: general aching Pain Descriptors / Indicators: Aching Pain Intervention(s): Limited activity within patient's tolerance, Monitored during session, Repositioned  Home Living                                          Prior Functioning/Environment              Frequency  Min 2X/week        Progress Toward Goals  OT Goals(current goals can now be found in the care plan section)  Progress towards OT goals: Progressing toward goals  Acute Rehab OT Goals Patient Stated Goal: to go home soon OT Goal Formulation: With patient/family Time For Goal Achievement: 08/12/21 Potential to Achieve Goals: Good ADL Goals Pt Will Perform Grooming: with supervision;standing Additional ADL Goal #1: Pt will verbalize 3 energy conservation strategies for ADL with one or less cues Additional ADL Goal #2: Pt will verbalize 3 strategies for low vision to assist with ADL with no cues  Plan Discharge plan remains appropriate    Co-evaluation                 AM-PAC OT "6 Clicks" Daily Activity     Outcome Measure   Help from another person eating meals?: None Help from another person taking care of personal grooming?: A Little Help from another person toileting, which includes using toliet, bedpan, or urinal?: A Little Help from another person bathing (including washing, rinsing, drying)?: A Little Help from another person to put on and taking off regular upper body clothing?: A Little Help from another person to put on and taking off regular lower body clothing?: A Little 6 Click Score: 19    End of Session Equipment Utilized During Treatment: Gait belt  OT Visit Diagnosis: Muscle weakness (generalized) (M62.81);Low vision, both eyes (H54.2)   Activity Tolerance Patient tolerated  treatment well   Patient Left in chair;with call bell/phone within reach;with family/visitor present   Nurse Communication Mobility status        Time: 6767-2094 OT Time Calculation (min): 29 min  Charges: OT General Charges $OT Visit: 1 Visit OT Treatments $Self Care/Home Management : 23-37 mins  Alphia Moh OTR/L  Acute Rehab Services  [phone] office number [phone] pager number   Alphia Moh 07/30/2021, 10:13 AM

## 2021-07-30 NOTE — Progress Notes (Signed)
Reviewed d/c instructions with pt and niece, piv d/c and dressing applied. Vs wnl, pt able to walk with little assistance

## 2021-08-06 ENCOUNTER — Encounter (HOSPITAL_COMMUNITY): Payer: Medicare Other

## 2021-08-06 DIAGNOSIS — I509 Heart failure, unspecified: Secondary | ICD-10-CM | POA: Diagnosis not present

## 2021-08-06 DIAGNOSIS — E1121 Type 2 diabetes mellitus with diabetic nephropathy: Secondary | ICD-10-CM | POA: Diagnosis not present

## 2021-08-07 ENCOUNTER — Encounter (HOSPITAL_BASED_OUTPATIENT_CLINIC_OR_DEPARTMENT_OTHER): Payer: Self-pay | Admitting: Cardiovascular Disease

## 2021-08-07 ENCOUNTER — Ambulatory Visit (HOSPITAL_BASED_OUTPATIENT_CLINIC_OR_DEPARTMENT_OTHER): Payer: Medicare Other | Admitting: Cardiovascular Disease

## 2021-08-07 DIAGNOSIS — I63431 Cerebral infarction due to embolism of right posterior cerebral artery: Secondary | ICD-10-CM | POA: Diagnosis not present

## 2021-08-07 DIAGNOSIS — I5023 Acute on chronic systolic (congestive) heart failure: Secondary | ICD-10-CM | POA: Diagnosis not present

## 2021-08-07 DIAGNOSIS — I2510 Atherosclerotic heart disease of native coronary artery without angina pectoris: Secondary | ICD-10-CM | POA: Insufficient documentation

## 2021-08-07 DIAGNOSIS — I480 Paroxysmal atrial fibrillation: Secondary | ICD-10-CM | POA: Diagnosis not present

## 2021-08-07 DIAGNOSIS — I10 Essential (primary) hypertension: Secondary | ICD-10-CM | POA: Diagnosis not present

## 2021-08-07 HISTORY — DX: Atherosclerotic heart disease of native coronary artery without angina pectoris: I25.10

## 2021-08-07 HISTORY — DX: Paroxysmal atrial fibrillation: I48.0

## 2021-08-07 MED ORDER — CARVEDILOL 3.125 MG PO TABS: 3.1250 mg | ORAL_TABLET | Freq: Two times a day (BID) | ORAL | 1 refills | Status: DC

## 2021-08-07 NOTE — Assessment & Plan Note (Addendum)
She has known obstructive coronary disease.  She has no angina.  LVEF is reduced from prior.  However without ischemic symptoms, there is no utility in repeating her ischemia evaluation at this time.  Continue atorvastatin and resuming her carvedilol as above.  Lipids are well controlled.  If her systolic function does not improve on her repeat echo, consider repeating her cardiac catheterization.

## 2021-08-07 NOTE — Assessment & Plan Note (Signed)
Blood pressure is very well controlled.  Resuming carvedilol as above.  Continue spironolactone and Entresto.

## 2021-08-07 NOTE — Patient Instructions (Addendum)
Medication Instructions:  RESTART CARVEDILOL 3.125 MG TWICE A DAY   *If you need a refill on your cardiac medications before your next appointment, please call your pharmacy*  Lab Work: BMET TODAY   If you have labs (blood work) drawn today and your tests are completely normal, you will receive your results only by: MyChart Message (if you have MyChart) OR A paper copy in the mail If you have any lab test that is abnormal or we need to change your treatment, we will call you to review the results.  Testing/Procedures: Your physician has requested that you have an echocardiogram. Echocardiography is a painless test that uses sound waves to create images of your heart. It provides your doctor with information about the size and shape of your heart and how well your heart's chambers and valves are working. This procedure takes approximately one hour. There are no restrictions for this procedure.  TO BE DONE IN 3 MONTHS, FEW DAYS PRIOR TO FOLLOW UP   Follow-Up: At [facility], you and your health needs are our priority.  As part of our continuing mission to provide you with exceptional heart care, we have created designated Provider Care Teams.  These Care Teams include your primary Cardiologist (physician) and Advanced Practice Providers (APPs -  Physician Assistants and Nurse Practitioners) who all work together to provide you with the care you need, when you need it.  We recommend signing up for the patient portal called "MyChart".  Sign up information is provided on this After Visit Summary.  MyChart is used to connect with patients for Virtual Visits (Telemedicine).  Patients are able to view lab/test results, encounter notes, upcoming appointments, etc.  Non-urgent messages can be sent to your provider as well.   To learn more about what you can do with MyChart, go to [email or web address].    Your next appointment:   3 month(s) FEW DAYS AFTER ECHO   The format for your next  appointment:   In Person  Provider:   Chilton Si, MD{    You have bee referred to Home PT, if you do not hear from them by next week call the office to follow up

## 2021-08-07 NOTE — Assessment & Plan Note (Signed)
Continue Eliquis and atorvastatin.

## 2021-08-07 NOTE — Assessment & Plan Note (Addendum)
She was recently admitted with heart failure.  LVEF 20-25% down from 40-45%.  She hasn't had any chest pain other than from her chest compressions.  She was started on Entresto and spironolactone.  Continue Farxiga.  Her carvedilol was held in the hospital due to low heart rates.  We will resume at 3.125 mg twice daily.  Keep checking blood pressures and heart rates at home.  Her weight has been very stable.  However she is down 6 pounds from when she was in the hospital.  We will check a basic metabolic panel today to make sure she is not getting too intravascularly volume depleted.  Continue torsemide for now.  Plan to repeat echo in 3 months and consider ICD candidacy if her LVEF remains less than 35%.  She has been very weak since discharge.  She is too weak to do cardiac rehab at this time.  I think she would benefit from home health PT and if she does well, then consider cardiac rehab at follow-up.

## 2021-08-07 NOTE — Assessment & Plan Note (Signed)
Currently in sinus rhythm.  Continue Eliquis and resume carvedilol as above.

## 2021-08-07 NOTE — Progress Notes (Signed)
Cardiology Office Note   Date:  08/07/2021   ID:  Yolanda, Saunders 08-May-1945, MRN [id number]  PCP:  Shirline Frees, MD  Cardiologist:   Skeet Latch, MD   No chief complaint on file.   History of Present Illness: Yolanda Saunders is a 76 y.o. female with chronic systolic diastolic heart failure, PAF, medically managed obstructive coronary disease, CKD 3, stroke, diabetes, hypertension, and hyperlipidemia here for follow-up.  She was admitted 07/2020 with stroke.  She had a large right PCA infarct.  She was found to have 75% stenosis of the right PCA with severe distal left P2 stenosis.  MRI of the brain showed moderate acute right PCA infarct and underlying chronic microvascular ischemic disease.  She had some remote lacunar infarcts in the basal ganglia and thalamus.  Echo that admission revealed LVEF 35 to 40%.  She also had COVID-19 and was treated with remdesivir.  She underwent left heart cath which revealed 100% occlusion of the mid LAD and D2.  She had left to left and right to left collaterals.  She also had 50% stenosis in the mid LAD and 70% distal OM 3 stenoses.  She had 60% stenosis in the RCA.  There is concern for apical thrombus as the source of her embolic stroke.  She was discharged with a 30-day monitor.  Outpatient cardiac MRI was recommended to rule out LV thrombus.  Her 30-day monitor did reveal atrial fibrillation.  She was started on Eliquis.  Cardiac MRI 10/2020 revealed LVEF 38% with LGE consistent with prior infarction.  There was no LV thrombus.  There was hypokinesis of the mid anterior, anteroseptal, inferoseptal, and inferior myocardium.  There was apical akinesis.  She followed up with Laurann Montana, NP on 10/2020.  Her blood pressure was above goal but she was not interested in increasing her medications or trying anything new.  At her last appointment her BP at home was running 130s on average.  She had an episode of chest heaviness on 01/2021.  She  went to the emergency department and was found to be sinus rhythm with PVCs.  Cardiac enzymes were negative.  She was admitted to the hospital 07/2021 with acute on chronic systolic and diastolic heart failure as well as hypoxic respiratory failure.  Echo revealed LVEF 20 to 25% and global hypokinesis.  She had grade 2 diastolic dysfunction and mildly reduced right ventricular function.  This was down from 35 to 40% previously.  She initially required BiPAP and subsequently intubation.  She was diuresed with IV Lasix.  She continued to have apneic spells requiring 48 hours of intubation.  She was started on Entresto and spironolactone.  Her carvedilol was held due to low heart rates.  She notes that she is still very tired.    Prior antihypertensives:  Lisinopril Spironolactone Losartan Amlodipine Metoprolol  Past Medical History:  Diagnosis Date   Anemia 04/17/11   "long, long years ago"   Angina 04/16/11   "that's what I'm here for"   Anxiety    Arthritis    Hips and knees   Asthma    CAD in native artery 08/07/2021   Carpal tunnel syndrome    Cataracts, bilateral    Chronic combined systolic and diastolic heart failure ([place]) 08/07/2020   CKD (chronic kidney disease), stage III ([place])    Complication of anesthesia 1987   "affected my eyes; couldn't see anything but blurrs even the next day"   CVA (cerebral infarction)  After cardiac catheter 02/2000   Depression    Edema    Fibromyalgia    GERD (gastroesophageal reflux disease)    Headache(784.0)    High cholesterol    History of bronchitis    Hypertension    IBS (irritable bowel syndrome)    Migraines    PAF (paroxysmal atrial fibrillation) ([place]) 08/07/2021   Panic attacks 04/17/11   "don't take anything for it"   Pneumonia 04/17/11   "probably as many as 7 times"   Renal disorder 04/17/11   "they are working at 60% capacity"   Shortness of breath on exertion    "cause of my asthma"   Stroke ([place]) 2002   residual "problem  w/using the right word, left 5 lesions on my brain/MRI; long term memory loss"   Type II diabetes mellitus ([place])     Past Surgical History:  Procedure Laterality Date   CARDIAC CATHETERIZATION  2002   CARPAL TUNNEL RELEASE  2003-2010   "twice on left; once on right"   Cross Bilateral 11/2019   Lenses Implant   histerectomy     LUMBAR LAMINECTOMY/ DECOMPRESSION WITH MET-RX Right 06/01/2020   Procedure: Right Lumbar Four-Five Minimally invasive discectomy;  Surgeon: Judith Part, MD;  Location: [facility];  Service: Neurosurgery;  Laterality: Right;   RIGHT/LEFT HEART CATH AND CORONARY ANGIOGRAPHY N/A 08/07/2020   Procedure: RIGHT/LEFT HEART CATH AND CORONARY ANGIOGRAPHY;  Surgeon: Belva Crome, MD;  Location: [facility];  Service: Cardiovascular;  Laterality: N/A;   VAGINAL HYSTERECTOMY  1977     Current Outpatient Medications  Medication Sig Dispense Refill   apixaban (ELIQUIS) 5 MG TABS tablet Take 1 tablet by mouth twice daily (Patient taking differently: Take 5 mg by mouth 2 (two) times daily.) 60 tablet 5   atorvastatin (LIPITOR) 80 MG tablet Take 1 tablet (80 mg total) by mouth daily. 30 tablet 0   Cholecalciferol (VITAMIN D-3 PO) Take 1 capsule by mouth daily.     dapagliflozin propanediol (FARXIGA) 10 MG TABS tablet Take 1 tablet (10 mg total) by mouth daily. 30 tablet 1   gabapentin (NEURONTIN) 100 MG capsule Take 100 mg by mouth at bedtime.     insulin glargine (LANTUS) 100 UNIT/ML injection Inject 0.1 mLs (10 Units total) into the skin daily. 20 mL 3   insulin regular (NOVOLIN R) 100 units/mL injection Inject 10-14 Units into the skin 3 (three) times daily before meals. Per sliding scale. OTC Novolin R.     pantoprazole (PROTONIX) 40 MG tablet Take 1 tablet (40 mg total) by mouth daily. 30 tablet 0   potassium chloride SA (KLOR-CON) 20 MEQ tablet Take 20 meq daily for the next 4 days only 4 tablet 0    sacubitril-valsartan (ENTRESTO) 24-26 MG Take 1 tablet by mouth 2 (two) times daily. 60 tablet 1   sertraline (ZOLOFT) 50 MG tablet Take 50 mg by mouth daily.     spironolactone (ALDACTONE) 25 MG tablet Take 1/2 tablet (12.5 mg total) by mouth daily. 30 tablet 1   torsemide (DEMADEX) 10 MG tablet Take 1 tablet (10 mg total) by mouth daily. 30 tablet 1   vitamin B-12 (CYANOCOBALAMIN) 1000 MCG tablet Take 1,000 mcg by mouth daily.     No current facility-administered medications for this visit.    Allergies:   Codeine, Erythromycin, Penicillins, Shellfish allergy, Sulfonamide derivatives, Zestril [lisinopril], Toprol xl [metoprolol], Actos [pioglitazone], Aldactone [  spironolactone], Amaryl [glimepiride], Apresoline [hydralazine], Benicar [olmesartan], Byetta 10 mcg pen [exenatide], Catapres [clonidine hcl], Flexeril [cyclobenzaprine], Glucotrol [glipizide], Glyburide-metformin, Humalog [insulin lispro], Invokana [canagliflozin], Januvia [sitagliptin], Neurontin [gabapentin], Norvasc [amlodipine], Prednisone, Propoxyphene, Reglan [metoclopramide], Septra [sulfamethoxazole-trimethoprim], Ultram [tramadol], Victoza [liraglutide], Zocor [simvastatin], Cozaar [losartan potassium], Novolin 70-30 [insulin nph isophane & regular], and Sulfa antibiotics    Social History:  The patient  reports that she quit smoking about 41 years ago. Her smoking use included cigarettes. She has a 4.50 pack-year smoking history. She has never used smokeless tobacco. She reports that she does not drink alcohol and does not use drugs.   Family History:  The patient's family history includes Aortic aneurysm in her son; CVA in her mother; Coronary artery disease in her father; Diabetes Mellitus I in her brother, brother, father, maternal grandfather, maternal grandmother, mother, paternal grandfather, paternal grandmother, and sister; Stroke in her mother.    ROS:  Please see the history of present illness.   Otherwise, review of  systems are positive for none.   All other systems are reviewed and negative.    PHYSICAL EXAM: VS:  BP (!) 110/54 (BP Location: Right Arm, Patient Position: Sitting, Cuff Size: Normal)   Pulse 71   Ht '5\' 3"'  (1.6 m)   Wt 158 lb 14.4 oz (72.1 kg)   SpO2 96%   BMI 28.15 kg/m  , BMI Body mass index is 28.15 kg/m. GENERAL:  Well appearing HEENT:  Pupils equal round and reactive, fundi not visualized, oral mucosa unremarkable NECK:  No jugular venous distention, waveform within normal limits, carotid upstroke brisk and symmetric, no bruits, no thyromegaly LUNGS:  Clear to auscultation bilaterally HEART:  RRR.  PMI not displaced or sustained,S1 and S2 within normal limits, no S3, no S4, no clicks, no rubs, no murmurs ABD:  Flat, positive bowel sounds normal in frequency in pitch, no bruits, no rebound, no guarding, no midline pulsatile mass, no hepatomegaly, no splenomegaly EXT:  2 plus pulses throughout, no edema, no cyanosis no clubbing SKIN:  No rashes no nodules NEURO:  Cranial nerves II through XII grossly intact, motor grossly intact throughout PSYCH:  Cognitively intact, oriented to person place and time   EKG:  EKG  ordered today.  Echo 07/25/21: 1. There is mild basilar sparing of left ventricular contractile  performance. No thrombus (Definity contrast). Left ventricular ejection  fraction, by estimation, is 20 to 25%. The left ventricle has severely  decreased function. The left ventricle  demonstrates global hypokinesis. The left ventricular internal cavity size  was mildly dilated. Left ventricular diastolic parameters are consistent  with Grade II diastolic dysfunction (pseudonormalization).   2. Right ventricular systolic function is mildly reduced. The right  ventricular size is normal. There is normal pulmonary artery systolic  pressure.   3. Left atrial size was severely dilated.   4. A small pericardial effusion is present. The pericardial effusion is  anterior to  the right ventricle. There is no evidence of cardiac  tamponade.   5. The mitral valve is normal in structure. Mild mitral valve  regurgitation. No evidence of mitral stenosis.   6. Tricuspid valve regurgitation is moderate.   7. The aortic valve is normal in structure. Aortic valve regurgitation is  mild. No aortic stenosis is present.   8. The inferior vena cava is normal in size with greater than 50%  respiratory variability, suggesting right atrial pressure of 3 mmHg.   Echo 08/03/20: 1. No left ventricular thrombus is seen with Definity  contrast. Left  ventricular ejection fraction, by estimation, is 35 to 40%. The left  ventricle has moderately decreased function. The left ventricle has no  regional wall motion abnormalities. Left  ventricular diastolic parameters are consistent with Grade I diastolic  dysfunction (impaired relaxation). There is severe hypokinesis of the left  ventricular, mid-apical anteroseptal wall and anterior wall. There is mild  dyskinesis of the left  ventricular, entire apical segment.   2. Right ventricular systolic function is normal. The right ventricular  size is normal. Tricuspid regurgitation signal is inadequate for assessing  PA pressure.   3. The mitral valve is normal in structure. Trivial mitral valve  regurgitation. No evidence of mitral stenosis.   4. The aortic valve is normal in structure. Aortic valve regurgitation is  mild. No aortic stenosis is present.   5. The inferior vena cava is normal in size with greater than 50%  respiratory variability, suggesting right atrial pressure of 3 mmHg.   LHC 08/07/20:  Mid LAD lesion is 100% stenosed.   2nd Diag lesion is 100% stenosed.   Total occlusion of the proximal to mid LAD with left to left and right to left collaterals.  LAD does not wraparound apex.  The first diagonal is large and also fills by collaterals. Irregularities with up to 50% in mid circumflex.  The very distal third obtuse  marginal contains segmental 70% stenosis. Left main is widely patent RCA has diffuse 60% mid to distal narrowing after the first acute marginal ostium. Right heart pressures are normal LVEDP is normal   RECOMMENDATIONS: Consider MRI to rule out apical thrombus versus empirical long-term anticoagulation therapy for presumed LV thrombus with embolic CVA.  Cardiac MRI 11/08/20: 1. Mildly increased left ventricular size, with LVEDD 52 mm, but LVEDVi 95 mL/m2.   Normal left ventricular thickness, with intraventricular septal thickness of 8 mm, posterior wall thickness of 6 mm, and myocardial mass index of 51 g/m2.   Moderately reduced left ventricular systolic function (LVEF = 38%). There are regional wall motion abnormalities: There is hypokinesis of the mid anterior, anteroseptal, inferoseptal, and inferior segments. There is akinesis of the apex and all apical segments.   Left ventricular parametric mapping notable for normal T2 signal mild increase in the mid LV segments (30-40%) and significant increase in the apex (46-54%) .   There is late gadolinium enhancement in the left ventricular myocardium: There is apical inferior 75 % LGE suggestive of infarction. There is apical anterior septum and anterior remodeling and thinning.   No LV thrombus noted.   2. Normal right ventricular size with RVEDVI 65 mL/m2.   Normal right ventricular thickness.   Normal right ventricular systolic function (RVEF =04%). There are no regional wall motion abnormalities or aneurysms.   3.  Normal left and right atrial size.   4. Normal size of the aortic root, ascending aorta and pulmonary artery.   5.  Qualitatively no significant valvular abnormalities.   6.  Normal pericardium.  No pericardial effusion.   7. Grossly, no extracardiac findings. Recommended dedicated study if concerned for non-cardiac pathology.   IMPRESSION: Ischemic Cardiomyopathy and evidence of LAD infarct. No LV  thrombus noted.   Recent Labs: 07/25/2021: B Natriuretic Peptide 450.3 07/26/2021: Magnesium 2.0 07/27/2021: ALT 35 07/29/2021: BUN 30; Creatinine, Ser 1.41; Hemoglobin 9.8; Platelets 126; Potassium 4.0; Sodium 146    Lipid Panel    Component Value Date/Time   CHOL 114 09/25/2020 0951   TRIG 39 07/26/2021 0202   HDL  33 (L) 09/25/2020 0951   CHOLHDL 3.5 09/25/2020 0951   CHOLHDL 9.3 08/03/2020 0119   VLDL 27 08/03/2020 0119   LDLCALC 59 09/25/2020 0951   LDLDIRECT 144.0 09/24/2018 1123      Wt Readings from Last 3 Encounters:  08/07/21 158 lb 14.4 oz (72.1 kg)  07/30/21 163 lb 1.6 oz (74 kg)  03/18/21 149 lb 3.2 oz (67.7 kg)      ASSESSMENT AND PLAN:  Acute on chronic HFrEF (heart failure with reduced ejection fraction) ([place]) She was recently admitted with heart failure.  LVEF 20-25% down from 40-45%.  She hasn't had any chest pain other than from her chest compressions.  She was started on Entresto and spironolactone.  Continue Farxiga.  Her carvedilol was held in the hospital due to low heart rates.  We will resume at 3.125 mg twice daily.  Keep checking blood pressures and heart rates at home.  Her weight has been very stable.  However she is down 6 pounds from when she was in the hospital.  We will check a basic metabolic panel today to make sure she is not getting too intravascularly volume depleted.  Continue torsemide for now.  Plan to repeat echo in 3 months and consider ICD candidacy if her LVEF remains less than 35%.  She has been very weak since discharge.  She is too weak to do cardiac rehab at this time.  I think she would benefit from home health PT and if she does well, then consider cardiac rehab at follow-up.  Essential hypertension Blood pressure is very well controlled.  Resuming carvedilol as above.  Continue spironolactone and Entresto.  Stroke ([facility]) Continue Eliquis and atorvastatin.  PAF (paroxysmal atrial fibrillation) ([facility]) Currently in sinus rhythm.   Continue Eliquis and resume carvedilol as above.  CAD in native artery She has known obstructive coronary disease.  She has no angina.  LVEF is reduced from prior.  However without ischemic symptoms, there is no utility in repeating her ischemia evaluation at this time.  Continue atorvastatin and resuming her carvedilol as above.  Lipids are well controlled.  If her systolic function does not improve on her repeat echo, consider repeating her cardiac catheterization.    Current medicines are reviewed at length with the patient today.  The patient does not have concerns regarding medicines.  The following changes have been made:  no change  Labs/ tests ordered today include:  No orders of the defined types were placed in this encounter.    Disposition:   FU with Ruslan Mccabe C. Oval Linsey, MD, [facility] in 6 months.    Signed, Naman Spychalski C. Oval Linsey, MD, [facility]  08/07/2021 2:19 PM    [facility]

## 2021-08-07 NOTE — Addendum Note (Signed)
Addended by: Regis Bill B on: 08/07/2021 02:40 PM   Modules accepted: Orders

## 2021-08-08 LAB — BASIC METABOLIC PANEL
BUN/Creatinine Ratio: 20 (ref 12–28)
BUN: 35 mg/dL — ABNORMAL HIGH (ref 8–27)
CO2: 25 mmol/L (ref 20–29)
Calcium: 10.1 mg/dL (ref 8.7–10.3)
Chloride: 103 mmol/L (ref 96–106)
Creatinine, Ser: 1.77 mg/dL — ABNORMAL HIGH (ref 0.57–1.00)
Glucose: 139 mg/dL — ABNORMAL HIGH (ref 70–99)
Potassium: 5.1 mmol/L (ref 3.5–5.2)
Sodium: 143 mmol/L (ref 134–144)
eGFR: 29 mL/min/{1.73_m2} — ABNORMAL LOW (ref >59)

## 2021-08-13 ENCOUNTER — Telehealth: Payer: Self-pay | Admitting: Adult Health

## 2021-08-13 ENCOUNTER — Ambulatory Visit: Payer: Medicare Other | Admitting: Adult Health

## 2021-08-13 NOTE — Telephone Encounter (Signed)
Pt's niece, Cathlyn Parsons cancelled pt's appt due to just getting out of the hospital

## 2021-08-14 ENCOUNTER — Ambulatory Visit (HOSPITAL_COMMUNITY)
Admission: RE | Admit: 2021-08-14 | Discharge: 2021-08-14 | Disposition: A | Discharge status: Home / Self Care | Payer: Medicare Other | Source: Ambulatory Visit | Attending: Cardiology | Admitting: Cardiology

## 2021-08-14 ENCOUNTER — Encounter (HOSPITAL_COMMUNITY): Payer: Self-pay

## 2021-08-14 VITALS — BP 104/52 | HR 62 | Ht 63.5 in | Wt 154.6 lb

## 2021-08-14 DIAGNOSIS — I5042 Chronic combined systolic (congestive) and diastolic (congestive) heart failure: Secondary | ICD-10-CM | POA: Diagnosis not present

## 2021-08-14 LAB — BASIC METABOLIC PANEL
Anion gap: 8 (ref 5–15)
BUN: 26 mg/dL — ABNORMAL HIGH (ref 8–23)
CO2: 26 mmol/L (ref 22–32)
Calcium: 9.6 mg/dL (ref 8.9–10.3)
Chloride: 106 mmol/L (ref 98–111)
Creatinine, Ser: 1.73 mg/dL — ABNORMAL HIGH (ref 0.44–1.00)
GFR, Estimated: 30 mL/min — ABNORMAL LOW (ref >60)
Glucose, Bld: 222 mg/dL — ABNORMAL HIGH (ref 70–99)
Potassium: 3.7 mmol/L (ref 3.5–5.1)
Sodium: 140 mmol/L (ref 135–145)

## 2021-08-14 LAB — BRAIN NATRIURETIC PEPTIDE: B Natriuretic Peptide: 173.8 pg/mL — ABNORMAL HIGH (ref 0.0–100.0)

## 2021-08-14 MED ORDER — TORSEMIDE 10 MG PO TABS: 10.0000 mg | ORAL_TABLET | ORAL | 1 refills | Status: DC | PRN

## 2021-08-14 MED ORDER — ENTRESTO 49-51 MG PO TABS: 1.0000 | ORAL_TABLET | Freq: Two times a day (BID) | ORAL | 6 refills | Status: DC

## 2021-08-14 NOTE — Progress Notes (Signed)
ReDS Vest / Clip - 08/14/21 1400       ReDS Vest / Clip   Station Marker A    Ruler Value 31    ReDS Value Range Low volume    ReDS Actual Value 26    Anatomical Comments sitting

## 2021-08-14 NOTE — Progress Notes (Signed)
HEART & VASCULAR TRANSITION OF CARE CONSULT NOTE     Referring Physician: Primary Care: Primary Cardiologist:  HPI: Referred to clinic by *** for heart failure consultation.   Bilateral carpal tunnel s/p b/l carpal tunnel release and known spinal stenosis   systolic diastolic heart failure, PAF, medically managed obstructive coronary disease, CKD 3, stroke, diabetes, hypertension, and hyperlipidemia here for follow-up.  She was admitted 07/2020 with stroke.  She had a large right PCA infarct.  She was found to have 75% stenosis of the right PCA with severe distal left P2 stenosis.  MRI of the brain showed moderate acute right PCA infarct and underlying chronic microvascular ischemic disease.  She had some remote lacunar infarcts in the basal ganglia and thalamus.  Echo that admission revealed LVEF 35 to 40%.  She also had COVID-19 and was treated with remdesivir.  She underwent left heart cath which revealed 100% occlusion of the mid LAD and D2.  She had left to left and right to left collaterals.  She also had 50% stenosis in the mid LAD and 70% distal OM 3 stenoses.  She had 60% stenosis in the RCA.  There is concern for apical thrombus as the source of her embolic stroke.  She was discharged with a 30-day monitor.  Outpatient cardiac MRI was recommended to rule out LV thrombus.  Her 30-day monitor did reveal atrial fibrillation.  She was started on Eliquis.  Cardiac MRI 10/2020 revealed LVEF 38% with LGE consistent with prior infarction.  There was no LV thrombus.  There was hypokinesis of the mid anterior, anteroseptal, inferoseptal, and inferior myocardium.  There was apical akinesis   admitted to the hospital 07/2021 with acute on chronic systolic and diastolic heart failure as well as hypoxic respiratory failure.  Echo revealed LVEF 20 to 25% and global hypokinesis.  She had grade 2 diastolic dysfunction and mildly reduced right ventricular function.  This was down from 35 to 40%  previously.  She initially required BiPAP and subsequently intubation.  She was diuresed with IV Lasix.  She continued to have apneic spells requiring 48 hours of intubation.  She was started on Entresto and spironolactone.  Her carvedilol was held due to low heart rates.     Here w/ daughter. Notes marked fatigue since restarting Coreg. Little engery. Hard to get out of the med and get going. + exertional dyspnea, NYHA Class II. Denies CP. Wt dropping since d/c, down about 9 lb.   ReDS 26%   Cardiac Testing   [facility] 07/2020   Mid LAD lesion is 100% stenosed.   2nd Diag lesion is 100% stenosed.   Total occlusion of the proximal to mid LAD with left to left and right to left collaterals.  LAD does not wraparound apex.  The first diagonal is large and also fills by collaterals. Irregularities with up to 50% in mid circumflex.  The very distal third obtuse marginal contains segmental 70% stenosis. Left main is widely patent RCA has diffuse 60% mid to distal narrowing after the first acute marginal ostium. Right heart pressures are normal LVEDP is normal  Diagnostic Dominance: Right  Intervention      Review of Systems: [y] = yes, [ ]  = no   General: Weight gain [ ] ; Weight loss [ ] ; Anorexia [ ] ; Fatigue [ ] ; Fever [ ] ; Chills [ ] ; Weakness [ ]   Cardiac: Chest pain/pressure [ ] ; Resting SOB [ ] ; Exertional SOB [ ] ; Orthopnea [ ] ; Pedal  Edema [ ] ; Palpitations [ ] ; Syncope [ ] ; Presyncope [ ] ; Paroxysmal nocturnal dyspnea[ ]   Pulmonary: Cough [ ] ; Wheezing[ ] ; Hemoptysis[ ] ; Sputum [ ] ; Snoring [ ]   GI: Vomiting[ ] ; Dysphagia[ ] ; Melena[ ] ; Hematochezia [ ] ; Heartburn[ ] ; Abdominal pain [ ] ; Constipation [ ] ; Diarrhea [ ] ; BRBPR [ ]   GU: Hematuria[ ] ; Dysuria [ ] ; Nocturia[ ]   Vascular: Pain in legs with walking [ ] ; Pain in feet with lying flat [ ] ; Non-healing sores [ ] ; Stroke [ ] ; TIA [ ] ; Slurred speech [ ] ;  Neuro: Headaches[ ] ; Vertigo[ ] ; Seizures[ ] ; Paresthesias[ ] ;Blurred  vision [ ] ; Diplopia [ ] ; Vision changes [ ]   Ortho/Skin: Arthritis [ ] ; Joint pain [ ] ; Muscle pain [ ] ; Joint swelling [ ] ; Back Pain [ ] ; Rash [ ]   Psych: Depression[ ] ; Anxiety[ ]   Heme: Bleeding problems [ ] ; Clotting disorders [ ] ; Anemia [ ]   Endocrine: Diabetes [ ] ; Thyroid dysfunction[ ]    Past Medical History:  Diagnosis Date   Anemia 04/17/11   "long, long years ago"   Angina 04/16/11   "that's what I'm here for"   Anxiety    Arthritis    Hips and knees   Asthma    CAD in native artery 08/07/2021   Carpal tunnel syndrome    Cataracts, bilateral    Chronic combined systolic and diastolic heart failure (HCC) 08/07/2020   CKD (chronic kidney disease), stage III (HCC)    Complication of anesthesia 1987   "affected my eyes; couldn't see anything but blurrs even the next day"   CVA (cerebral infarction)    After cardiac catheter 02/2000   Depression    Edema    Fibromyalgia    GERD (gastroesophageal reflux disease)    Headache(784.0)    High cholesterol    History of bronchitis    Hypertension    IBS (irritable bowel syndrome)    Migraines    PAF (paroxysmal atrial fibrillation) (HCC) 08/07/2021   Panic attacks 04/17/11   "don't take anything for it"   Pneumonia 04/17/11   "probably as many as 7 times"   Renal disorder 04/17/11   "they are working at 60% capacity"   Shortness of breath on exertion    "cause of my asthma"   Stroke (HCC) 2002   residual "problem w/using the right word, left 5 lesions on my brain/MRI; long term memory loss"   Type II diabetes mellitus (HCC)     Current Outpatient Medications  Medication Sig Dispense Refill   apixaban (ELIQUIS) 5 MG TABS tablet Take 1 tablet by mouth twice daily (Patient taking differently: Take 5 mg by mouth 2 (two) times daily.) 60 tablet 5   atorvastatin (LIPITOR) 80 MG tablet Take 1 tablet (80 mg total) by mouth daily. 30 tablet 0   carvedilol (COREG) 3.125 MG tablet Take 1 tablet (3.125 mg total) by mouth 2 (two)  times daily. 180 tablet 1   Cholecalciferol (VITAMIN D-3 PO) Take 1 capsule by mouth daily.     dapagliflozin propanediol (FARXIGA) 10 MG TABS tablet Take 1 tablet (10 mg total) by mouth daily. 30 tablet 1   gabapentin (NEURONTIN) 100 MG capsule Take 100 mg by mouth at bedtime.     insulin glargine (LANTUS) 100 UNIT/ML injection Inject 0.1 mLs (10 Units total) into the skin daily. 20 mL 3   insulin regular (NOVOLIN R) 100 units/mL injection Inject 10-14 Units into the skin 3 (three) times daily before  meals. Per sliding scale. OTC Novolin R.     pantoprazole (PROTONIX) 40 MG tablet Take 1 tablet (40 mg total) by mouth daily. 30 tablet 0   potassium chloride SA (KLOR-CON) 20 MEQ tablet Take 20 meq daily for the next 4 days only 4 tablet 0   sacubitril-valsartan (ENTRESTO) 24-26 MG Take 1 tablet by mouth 2 (two) times daily. 60 tablet 1   sertraline (ZOLOFT) 50 MG tablet Take 50 mg by mouth daily.     spironolactone (ALDACTONE) 25 MG tablet Take 1/2 tablet (12.5 mg total) by mouth daily. 30 tablet 1   torsemide (DEMADEX) 10 MG tablet Take 1 tablet (10 mg total) by mouth daily. 30 tablet 1   vitamin B-12 (CYANOCOBALAMIN) 1000 MCG tablet Take 1,000 mcg by mouth daily.     No current facility-administered medications for this visit.    Allergies  Allergen Reactions   Codeine Other (See Comments)    "makes me crazy; see things; delusional"   Erythromycin Rash and Other (See Comments)    Peeling skin Skin turned "purplish"    Penicillins Rash and Other (See Comments)    Skin blisters   Shellfish Allergy Anaphylaxis and Swelling    Throat swells   Sulfonamide Derivatives Hives   Zestril [Lisinopril] Cough   Toprol Xl [Metoprolol] Diarrhea and Nausea And Vomiting    Rapid weight gain Hallucinations    Actos [Pioglitazone] Other (See Comments)    Upset GI   Aldactone [Spironolactone] Other (See Comments)    Unknown reaction   Amaryl [Glimepiride] Other (See Comments)    Upset GI    Apresoline [Hydralazine] Other (See Comments)    Unknown reaction   Benicar [Olmesartan] Other (See Comments)    Dizziness Headaches   [place]etta 10 Mcg Pen [Exenatide] Nausea And Vomiting   Catapres [Clonidine Hcl] Other (See Comments)    Unknown reaction   Flexeril [Cyclobenzaprine] Other (See Comments)    Drowsiness    Glucotrol [Glipizide] Other (See Comments)    Upset GI   Glyburide-Metformin Other (See Comments)    Myalgias   Humalog [Insulin Lispro] Other (See Comments)    Headache Severe hyperglycemia   Invokana [Canagliflozin] Other (See Comments)    Yeast infections   Januvia [Sitagliptin] Other (See Comments)    UTI   Neurontin [Gabapentin] Other (See Comments)    Anxiety    Norvasc [Amlodipine] Other (See Comments)    Syncope   Prednisone Other (See Comments)    Unknown reaction   Propoxyphene Other (See Comments)    Unknown reaction Darvocet-N   Reglan [Metoclopramide] Other (See Comments)    Unknown reaction   Septra [Sulfamethoxazole-Trimethoprim] Hives   Ultram [Tramadol] Other (See Comments)    Anxiety    Victoza [Liraglutide] Diarrhea    Upset GI   Zocor [Simvastatin] Other (See Comments)    Myalgias    Cozaar [Losartan Potassium] Rash    Upset GI   Novolin 70-30 [Insulin Nph Isophane & Regular] Swelling and Rash   Sulfa Antibiotics Hives and Rash      Social History   Socioeconomic History   Marital status: Widowed    Spouse name: Not on file   Number of children: 2   Years of education: Not on file   Highest education level: Not on file  Occupational History   Occupation: retired  Tobacco Use   Smoking status: Former    Packs/day: 0.75    Years: 6.00    Total pack years: 4.50  Types: Cigarettes    Quit date: 01/21/1980    Years since quitting: 41.5   Smokeless tobacco: Never  Vaping Use   Vaping Use: Not on file  Substance and Sexual Activity   Alcohol use: No    Alcohol/week: 0.0 standard drinks of alcohol   Drug use: No    Sexual activity: Not Currently  Other Topics Concern   Not on file  Social History Narrative   Lives with husband in a one story home.  Has 2 children.  Retired from [employer].  Education: 12th grade.  Trade schools.    Social Determinants of Health   Financial Resource Strain: Low Risk  (07/29/2021)   Overall Financial Resource Strain (CARDIA)    Difficulty of Paying Living Expenses: Not hard at all  Food Insecurity: No Food Insecurity (07/29/2021)   Hunger Vital Sign    Worried About Running Out of Food in the Last Year: Never true    Ran Out of Food in the Last Year: Never true  Transportation Needs: No Transportation Needs (07/29/2021)   PRAPARE - Hydrologist (Medical): No    Lack of Transportation (Non-Medical): No  Physical Activity: Not on file  Stress: Not on file  Social Connections: Not on file  Intimate Partner Violence: Not on file      Family History  Problem Relation Age of Onset   Coronary artery disease Father    Diabetes Mellitus I Father    CVA Mother    Stroke Mother    Diabetes Mellitus I Mother    Diabetes Mellitus I Sister    Diabetes Mellitus I Brother    Diabetes Mellitus I Maternal Grandmother    Diabetes Mellitus I Maternal Grandfather    Diabetes Mellitus I Paternal Grandmother    Diabetes Mellitus I Paternal Grandfather    Diabetes Mellitus I Brother    Aortic aneurysm Son     There were no vitals filed for this visit.  PHYSICAL EXAM: General:  Well appearing. No respiratory difficulty HEENT: normal Neck: supple. no JVD. Carotids 2+ bilat; no bruits. No lymphadenopathy or thryomegaly appreciated. Cor: PMI nondisplaced. Regular rate & rhythm. No rubs, gallops or murmurs. Lungs: clear Abdomen: soft, nontender, nondistended. No hepatosplenomegaly. No bruits or masses. Good bowel sounds. Extremities: no cyanosis, clubbing, rash, edema Neuro: alert & oriented x 3, cranial nerves grossly intact. moves all 4  extremities w/o difficulty. Affect pleasant.  ECG:   ASSESSMENT & PLAN:  Stop Coreg. Increase Entresto to 49-51 mg bid. Switch torsemide to PRN.   F/u in TOC in 3 weeks for help w/ further med titration. Keep f/u echo and f/u w/ Dr. Oval Linsey in 2 months.   NYHA *** GDMT  Diuretic- BB- Ace/ARB/ARNI MRA SGLT2i    Referred to HFSW (PCP, Medications, Transportation, ETOH Abuse, Drug Abuse, Insurance, Financial ): Yes or No Refer to Pharmacy: Yes or No Refer to Home Health: Yes on No Refer to Advanced Heart Failure Clinic: Yes or no  Refer to General Cardiology: Yes or No  Follow up

## 2021-08-14 NOTE — Patient Instructions (Signed)
STOP Coreg INCREASE Entresto to 49-51 mg, one tab twice a day CHANGE Torsemide 10 mg to as needed for weights 158 lbs or greater  Labs today We will only contact you if something comes back abnormal or we need to make some changes. Otherwise no news is good news!  Thank you for allowing [place] to provider your heart failure care after your recent hospitalization. Please follow-up with our TOC clinic in 4 weeks  Do the following things EVERYDAY: Weigh yourself in the morning before breakfast. Write it down and keep it in a log. Take your medicines as prescribed Eat low salt foods--Limit salt (sodium) to 2000 mg per day.  Stay as active as you can everyday Limit all fluids for the day to less than 2 liters

## 2021-08-15 ENCOUNTER — Encounter (HOSPITAL_COMMUNITY): Payer: Self-pay

## 2021-08-16 ENCOUNTER — Other Ambulatory Visit: Payer: Self-pay | Admitting: Internal Medicine

## 2021-08-21 ENCOUNTER — Other Ambulatory Visit: Payer: Self-pay | Admitting: Internal Medicine

## 2021-08-22 ENCOUNTER — Other Ambulatory Visit: Payer: Self-pay

## 2021-08-22 DIAGNOSIS — E1122 Type 2 diabetes mellitus with diabetic chronic kidney disease: Secondary | ICD-10-CM

## 2021-08-22 MED ORDER — ONETOUCH VERIO VI STRP: ORAL_STRIP | 0 refills | Status: DC

## 2021-08-26 ENCOUNTER — Telehealth (HOSPITAL_COMMUNITY): Payer: Self-pay

## 2021-08-26 NOTE — Telephone Encounter (Signed)
Patient assistance applications for [place] initiated. Patient portion completed and signed. Faxed to [facility] for provider completion and submission.  Sharen Hones, PharmD, BCPS Heart Failure Stewardship Pharmacist Phone [phone]

## 2021-08-27 ENCOUNTER — Telehealth (HOSPITAL_BASED_OUTPATIENT_CLINIC_OR_DEPARTMENT_OTHER): Payer: Self-pay | Admitting: *Deleted

## 2021-08-27 MED ORDER — ENTRESTO 49-51 MG PO TABS: 1.0000 | ORAL_TABLET | Freq: Two times a day (BID) | ORAL | 3 refills | Status: DC

## 2021-08-27 MED ORDER — DAPAGLIFLOZIN PROPANEDIOL 10 MG PO TABS: 10.0000 mg | ORAL_TABLET | Freq: Every day | ORAL | 3 refills | Status: DC

## 2021-08-27 NOTE — Telephone Encounter (Signed)
Received patient assistance forms for Marcelline Deist and Sherryll Burger   Printed Rx's and forms filled out for Dr Duke Salvia to sign Will fax once completed

## 2021-08-28 ENCOUNTER — Encounter (HOSPITAL_COMMUNITY): Payer: Self-pay

## 2021-08-28 ENCOUNTER — Ambulatory Visit (HOSPITAL_COMMUNITY)
Admission: RE | Admit: 2021-08-28 | Discharge: 2021-08-28 | Disposition: A | Discharge status: Home / Self Care | Payer: Medicare Other | Source: Ambulatory Visit | Attending: Adult Health | Admitting: Adult Health

## 2021-08-28 ENCOUNTER — Other Ambulatory Visit (HOSPITAL_COMMUNITY): Payer: Self-pay

## 2021-08-28 ENCOUNTER — Telehealth (HOSPITAL_COMMUNITY): Payer: Self-pay | Admitting: *Deleted

## 2021-08-28 VITALS — BP 106/52 | HR 66 | Wt 164.6 lb

## 2021-08-28 DIAGNOSIS — I480 Paroxysmal atrial fibrillation: Secondary | ICD-10-CM

## 2021-08-28 DIAGNOSIS — Z87891 Personal history of nicotine dependence: Secondary | ICD-10-CM | POA: Insufficient documentation

## 2021-08-28 DIAGNOSIS — I5042 Chronic combined systolic (congestive) and diastolic (congestive) heart failure: Secondary | ICD-10-CM

## 2021-08-28 DIAGNOSIS — I130 Hypertensive heart and chronic kidney disease with heart failure and stage 1 through stage 4 chronic kidney disease, or unspecified chronic kidney disease: Secondary | ICD-10-CM | POA: Diagnosis not present

## 2021-08-28 DIAGNOSIS — I252 Old myocardial infarction: Secondary | ICD-10-CM | POA: Diagnosis not present

## 2021-08-28 DIAGNOSIS — N1831 Chronic kidney disease, stage 3a: Secondary | ICD-10-CM | POA: Diagnosis not present

## 2021-08-28 DIAGNOSIS — I5022 Chronic systolic (congestive) heart failure: Secondary | ICD-10-CM | POA: Insufficient documentation

## 2021-08-28 DIAGNOSIS — Z79899 Other long term (current) drug therapy: Secondary | ICD-10-CM | POA: Insufficient documentation

## 2021-08-28 DIAGNOSIS — Z7984 Long term (current) use of oral hypoglycemic drugs: Secondary | ICD-10-CM | POA: Diagnosis not present

## 2021-08-28 DIAGNOSIS — E669 Obesity, unspecified: Secondary | ICD-10-CM | POA: Diagnosis not present

## 2021-08-28 DIAGNOSIS — N183 Chronic kidney disease, stage 3 unspecified: Secondary | ICD-10-CM | POA: Diagnosis not present

## 2021-08-28 DIAGNOSIS — I2510 Atherosclerotic heart disease of native coronary artery without angina pectoris: Secondary | ICD-10-CM | POA: Diagnosis not present

## 2021-08-28 DIAGNOSIS — Z8673 Personal history of transient ischemic attack (TIA), and cerebral infarction without residual deficits: Secondary | ICD-10-CM | POA: Insufficient documentation

## 2021-08-28 DIAGNOSIS — E1122 Type 2 diabetes mellitus with diabetic chronic kidney disease: Secondary | ICD-10-CM | POA: Insufficient documentation

## 2021-08-28 DIAGNOSIS — E785 Hyperlipidemia, unspecified: Secondary | ICD-10-CM | POA: Diagnosis not present

## 2021-08-28 MED ORDER — TORSEMIDE 20 MG PO TABS: 20.0000 mg | ORAL_TABLET | ORAL | 3 refills | Status: DC

## 2021-08-28 NOTE — Progress Notes (Signed)
[facility] Transitions of Care Clinic Heart Failure Pharmacist Encounter  PCP: Johny Blamer, MD PCP-Cardiologist: Chilton Si, MD  HPI:  Yolanda Saunders is 76 yo female with chronic diastolic HF, CAD, stage III CKD, HTN, HLD, T2DM, obesity, and h/o CVA. Hx of tobacco use.   She was admitted in 7/22 with stroke, presenting with a large right PCA infarct. She underwent a left heart cath that demonstrating 100% occlusion of both the mid LAD and D2. Pt was discharged on a 30 day monitor that revealed afib. She was started on Eliquis at that time. Additionally, a cardiac MRI revealed a prior infarction.   She was admitted in 7/23 for HF exacerbation and respiratory failure. Echo during this time was 20-25%, previously 35-40% in 7/22. CXR showed some mildly worsened moderate pulmonary edema. She was started on Entresto, Farxiga, and spironolactone while inpatient. Carvedilol 6.25mg  BID was held during this admission due to low HR.   She had a f/up postdischarge with MD Idelle Jo on 7/19. Carvedilol was restarted at this visit at a dose of 3.125mg  BID.  Pt reported general weakness this visit. Team is considering ICD candidacy if LVEF remains below 35%.   Pt was seen on 7/26 by PA Brittainy Simmons. Pt reports fatigue since reinitiation of Coreg with difficultly moving around, getting out of bed, and exertional dyspnea. HR dropped at this visit from 71 to 62. Stopped carvedilol at this visit.   Today, Yolanda Saunders presents to the Heart Failure TOC Clinic for follow up.   Pt denies SOB and orthopnea. Pt endorses exhaustion, edema, dizziness, and lightheadedness. She is following a low-sodium/fluid restricted diet and is monitoring her daily weights at home. Her niece helps fill her pill boxes at home and pt reports complete adherence.  Her weight has been going up so took Torsemide 5mg  for 3 days starting 8/4 and Torsemide 10mg  for 3 days. However, she has not noticed  increased urination. Stated she only used the restroom once yesterday.  HF Medications:  Current HF medications include:  - Farxiga 10mg  1 tab daily - Entresto 49/51mg  1 tab BID - Spironlactone 25mg  1/2 tab daily -Torsemide 10mg  1 tab prn  Has the patient been experiencing any side effects to the medications prescribed?  Yes; pt reports that she is unable to swallow a full atorvastatin tablet so she crushes the tablet every evening. She states that she almost vomits the medication every day and would like a smaller tablet to take.  Does the patient have any problems obtaining medications due to transportation or finances?   Yes - pt assistance in process for Entresto and Farxiga   Understanding of regimen: good Understanding of indications: excellent Potential of compliance: excellent Patient understands to avoid NSAIDs. Patient understands to avoid decongestants.   Pertinent Lab Values: Serum creatinine 1.73, BUN 26, Potassium 3.7, Sodium 140, BNP 173.8, Magnesium 2.0  Vital Signs: Weight: 164 lbs (discharge weight: 163 lbs) Blood pressure: 106/52  Heart rate: 66   Medication Assistance / Insurance Benefits Check: Does the patient have prescription insurance?  Yes Type of insurance plan: AARP Medicare  Does the patient qualify for medication assistance through manufacturers or grants?   Yes Eligible grants and/or patient assistance programs: Entresto & Farxiga  Medication assistance applications in progress: Entresto & Farxiga  Medication assistance applications approved: none  Approved medication assistance renewals will be completed by: [facility]  Outpatient Pharmacy:  Current outpatient pharmacy: [id number] Was the [facility] pharmacy used to  supply discharge medications? yes  If TOC pharmacy was used, were the refills transferred out to current pharmacy yet? yes  Is the patient willing to transition their outpatient pharmacy to utilize a [facility] outpatient pharmacy  with or without mail order?   No  Assessment: 1) Chronic systolic CHF (EF 36-12%), due to ischemic causes. NYHA class III symptoms. - Pt has been experiencing a low HR and increased fatigue with previous initiation of Coreg. Could reconsider with a different more selective beta blocker at a different visit when she is back to baseline with dyspnea and fatigue. -Pt is in the process of receiving assistance for [place]. BP has been soft at previous visits, so may be best to hold off on dose optimization of Entresto.  -Pt was recently started on spironlactone. Serum K has since dropped to 3.7 from 5.1 a week prior on 7/19. Could increase medication dose with drop in potassium to closer to goal GDMT dosing at next visit due to frequent dizziness complaints.  - Increased congestion symptoms at this visit warranting increase of diuretic therapy to daily from prn. Has been taking almost daily since Friday 8/4  Plan: 1) Medication changes: - Continue Spironolactone 1/2 tab 25 mg daily. - Continue Farxiga 10 mg daily -Continue Entresto 49/51 mg BID  -Increase Torsemide to 20mg  daily -Monitor daily weights and urinary frequency  -Recommend discontinuation of atorvastatin and initiate of rosuvastatin 40mg  once daily to help with medication tolerability.   2) Patient Assistance: - Current patient assistance programs are pending approval. Submitted on 8/7  3) Follow up: - Next appointment with cardiologist on 11/6  10/7, PharmD PGY-1 [facility] Resident 08/28/2021 12:17 PM

## 2021-08-28 NOTE — Progress Notes (Signed)
ReDS Vest / Clip - 08/28/21 1400       ReDS Vest / Clip   Station Marker A    Ruler Value 25.5    ReDS Value Range Moderate volume overload    ReDS Actual Value 39

## 2021-08-28 NOTE — Progress Notes (Addendum)
[facility]    PCP: Dr Tiburcio Pea  Primary Cardiologist: Dr Duke Salvia  HPI: [name] is a 76 y/o female w/ chronic combined systolic and diastolic heart failure, CAD, Stage III CKD, Type 2DM, HTN, HLD, h/o stroke and bradycardia. Also w/ history of bilateral carpal tunnel s/p b/l carpal tunnel release and known spinal stenosis.    She was admitted last year, 07/2020, with stroke.  She had a large right PCA infarct.  She was found to have 75% stenosis of the right PCA with severe distal left P2 stenosis.  MRI of the brain showed moderate acute right PCA infarct and underlying chronic microvascular ischemic disease.  She had some remote lacunar infarcts in the basal ganglia and thalamus.  Echo that admission revealed LVEF 35 to 40%.  She also had COVID-19 and was treated with remdesivir.  She underwent left heart cath which revealed 100% occlusion of the mid LAD and D2.  She had left to left and right to left collaterals.  She also had 50% stenosis in the mid LAD and 70% distal OM 3 stenoses.  She had 60% stenosis in the RCA.  There was concern for apical thrombus as the source of her embolic stroke.  She was discharged with a 30-day monitor.  Outpatient cardiac MRI was recommended to rule out LV thrombus.  Her 30-day monitor did reveal atrial fibrillation.  She was started on Eliquis.  Cardiac MRI 10/2020 revealed LVEF 38% with LGE consistent with prior infarction. There was no LV thrombus.  There was hypokinesis of the mid anterior, anteroseptal, inferoseptal, and inferior myocardium.  There was apical akinesis. Not noted evidence of infiltrative CM in report.    She was recently admitted 07/2021 w/ a/c systolic CHF and hypoxic respiratory failure. She initially required BiPAP and subsequently intubation. COVID negative. Denied CP. Hs trop 68>>65. D-dimer normal. BNP 450. CXR w/ pulmonary edema. She was in SR. Echo revealed further drop in  LVEF 20 to 25% and global hypokinesis. She  had grade 2 diastolic dysfunction and mildly reduced right ventricular function. She was diuresed w/ IV Lasix and later extubated.  She was started on Entresto and spironolactone.  Her carvedilol was held due to low heart rates.     Had post hospital f/u w/ Dr. Duke Salvia last week. BP was moderately elevated. HR was ok. Not bradycardic. Wt was down 6 lb from d/c wt. She was restarted on low dose ? blocker, Coreg 3.125. BMP showed slight bump in SCr from 1.4>>1.7. Plan was for repeat echo in 3 months to reassess LVEF. If no improvement w/ medical therapy, then would consider relook LHC and possible ICD placement.    [facility]. BB stopped due to fatigue/bradycardia, entresto increased, and lasix was switched to as needed. She took  tosemide 5 mg 8/4 and 8/5 and 10 mg on 8/7, 8/8, 8/9.    She retuns for HF follow up with her niece.  She is not short of breath but complaining of fatigue. Denies PND/Orthopnea. Intermittent chest pain more noticeable when she is  tired. Appetite ok. No fever or chills. Weight at home has been trending up 156---161 pounds. Taking all medications.   Cardiac Studies   2D Echo 07/25/21 There is mild basilar sparing of left ventricular contractile performance. No thrombus (Definity contrast). Left ventricular ejection fraction, by estimation, is 20 to 25%. The left ventricle has severely decreased function. The left  ventricle demonstrates global hypokinesis. The left ventricular internal cavity size was mildly dilated. Left ventricular diastolic parameters are consistent with Grade II diastolic dysfunction (pseudonormalization). 1. Right ventricular systolic function is mildly reduced. The right ventricular size is normal. There is normal pulmonary artery systolic pressure. 2. 3. Left atrial size was severely dilated. A small pericardial effusion is present. The pericardial effusion is anterior to the right ventricle. There is no  evidence of cardiac tamponade. 4. The mitral valve is normal in structure. Mild mitral valve regurgitation. No evidence of mitral stenosis. 5. 6. Tricuspid valve regurgitation is moderate. The aortic valve is normal in structure. Aortic valve regurgitation is mild. No aortic stenosis is present. 7. The inferior vena cava is normal in size with greater than 50% respiratory variability, suggesting right atrial pressure of 3 mmHg.   LHC 07/2020 RA 4 PA 33/15 (22) , PCWP 13, CO 7.2 CI 3.4    Mid LAD lesion is 100% stenosed.   2nd Diag lesion is 100% stenosed.   Total occlusion of the proximal to mid LAD with left to left and right to left collaterals.  LAD does not wraparound apex.  The first diagonal is large and also fills by collaterals. Irregularities with up to 50% in mid circumflex.  The very distal third obtuse marginal contains segmental 70% stenosis. Left main is widely patent RCA has diffuse 60% mid to distal narrowing after the first acute marginal ostium. Right heart pressures are normal LVEDP is normal   Diagnostic Dominance: Right  Intervention         Cardiac MRI 10/2020 FINDINGS: 1. Mildly increased left ventricular size, with LVEDD 52 mm, but LVEDVi 95 mL/m2.   Normal left ventricular thickness, with intraventricular septal thickness of 8 mm, posterior wall thickness of 6 mm, and myocardial mass index of 51 g/m2.   Moderately reduced left ventricular systolic function (LVEF = 38%). There are regional wall motion abnormalities: There is hypokinesis of the mid anterior, anteroseptal, inferoseptal, and inferior segments. There is akinesis of the apex and all apical segments.   Left ventricular parametric mapping notable for normal T2 signal mild increase in the mid LV segments (30-40%) and significant increase in the apex (46-54%) .   There is late gadolinium enhancement in the left ventricular myocardium: There is apical inferior 75 % LGE suggestive  of infarction. There is apical anterior septum and anterior remodeling and thinning.   No LV thrombus noted.   2. Normal right ventricular size with RVEDVI 65 mL/m2.   Normal right ventricular thickness.   Normal right ventricular systolic function (RVEF =56%). There are no regional wall motion abnormalities or aneurysms.   3.  Normal left and right atrial size.   4. Normal size of the aortic root, ascending aorta and pulmonary artery.   5.  Qualitatively no significant valvular abnormalities.   6.  Normal pericardium.  No pericardial effusion.   7. Grossly, no extracardiac findings. Recommended dedicated study if concerned for non-cardiac pathology.   IMPRESSION: Ischemic Cardiomyopathy and evidence of LAD infarct. No LV thrombus noted.      ROS: All systems negative except as listed in HPI, PMH and Problem List.  SH:  Social History   Socioeconomic History   Marital status: Widowed    Spouse name: Not on file   Number of children: 2   Years of education: Not on file   Highest education level: Not on file  Occupational History   Occupation: retired  Tobacco Use  Smoking status: Former    Packs/day: 0.75    Years: 6.00    Total pack years: 4.50    Types: Cigarettes    Quit date: 01/21/1980    Years since quitting: 41.6   Smokeless tobacco: Never  Vaping Use   Vaping Use: Not on file  Substance and Sexual Activity   Alcohol use: No    Alcohol/week: 0.0 standard drinks of alcohol   Drug use: No   Sexual activity: Not Currently  Other Topics Concern   Not on file  Social History Narrative   Lives with husband in a one story home.  Has 2 children.  Retired from [employer].  Education: 12th grade.  Trade schools.    Social Determinants of Health   Financial Resource Strain: Low Risk  (07/29/2021)   Overall Financial Resource Strain (CARDIA)    Difficulty of Paying Living Expenses: Not hard at all  Food Insecurity: No Food Insecurity (07/29/2021)    Hunger Vital Sign    Worried About Running Out of Food in the Last Year: Never true    Ran Out of Food in the Last Year: Never true  Transportation Needs: No Transportation Needs (07/29/2021)   PRAPARE - Administrator, Civil Service (Medical): No    Lack of Transportation (Non-Medical): No  Physical Activity: Not on file  Stress: Not on file  Social Connections: Not on file  Intimate Partner Violence: Not on file    FH:  Family History  Problem Relation Age of Onset   Coronary artery disease Father    Diabetes Mellitus I Father    CVA Mother    Stroke Mother    Diabetes Mellitus I Mother    Diabetes Mellitus I Sister    Diabetes Mellitus I Brother    Diabetes Mellitus I Maternal Grandmother    Diabetes Mellitus I Maternal Grandfather    Diabetes Mellitus I Paternal Grandmother    Diabetes Mellitus I Paternal Grandfather    Diabetes Mellitus I Brother    Aortic aneurysm Son     Past Medical History:  Diagnosis Date   Anemia 04/17/11   "long, long years ago"   Angina 04/16/11   "that's what I'm here for"   Anxiety    Arthritis    Hips and knees   Asthma    CAD in native artery 08/07/2021   Carpal tunnel syndrome    Cataracts, bilateral    Chronic combined systolic and diastolic heart failure (HCC) 08/07/2020   CKD (chronic kidney disease), stage III (HCC)    Complication of anesthesia 1987   "affected my eyes; couldn't see anything but blurrs even the next day"   CVA (cerebral infarction)    After cardiac catheter 02/2000   Depression    Edema    Fibromyalgia    GERD (gastroesophageal reflux disease)    Headache(784.0)    High cholesterol    History of bronchitis    Hypertension    IBS (irritable bowel syndrome)    Migraines    PAF (paroxysmal atrial fibrillation) (HCC) 08/07/2021   Panic attacks 04/17/11   "don't take anything for it"   Pneumonia 04/17/11   "probably as many as 7 times"   Renal disorder 04/17/11   "they are working at 60% capacity"    Shortness of breath on exertion    "cause of my asthma"   Stroke (HCC) 2002   residual "problem w/using the right word, left 5 lesions on my brain/MRI; long  term memory loss"   Type II diabetes mellitus (HCC)     Current Outpatient Medications  Medication Sig Dispense Refill   apixaban (ELIQUIS) 5 MG TABS tablet Take 1 tablet by mouth twice daily 60 tablet 5   atorvastatin (LIPITOR) 80 MG tablet Take 1 tablet (80 mg total) by mouth daily. 30 tablet 0   Cholecalciferol (VITAMIN D-3 PO) Take 1 capsule by mouth daily.     dapagliflozin propanediol (FARXIGA) 10 MG TABS tablet Take 1 tablet (10 mg total) by mouth daily. 90 tablet 3   gabapentin (NEURONTIN) 100 MG capsule Take 100 mg by mouth at bedtime.     glucose blood (ONETOUCH VERIO) test strip Use as instructed to check blood sugar 4X daily 400 each 0   insulin regular (NOVOLIN R) 100 units/mL injection Inject 10-14 Units into the skin 3 (three) times daily before meals. Per sliding scale. OTC Novolin R.     pantoprazole (PROTONIX) 40 MG tablet Take 1 tablet (40 mg total) by mouth daily. 30 tablet 0   sacubitril-valsartan (ENTRESTO) 49-51 MG Take 1 tablet by mouth 2 (two) times daily. 180 tablet 3   sertraline (ZOLOFT) 50 MG tablet Take 50 mg by mouth daily.     spironolactone (ALDACTONE) 25 MG tablet Take 1/2 tablet (12.5 mg total) by mouth daily. 30 tablet 1   torsemide (DEMADEX) 10 MG tablet Take 1 tablet (10 mg total) by mouth as needed (weights 158 lb or greater). 30 tablet 1   vitamin B-12 (CYANOCOBALAMIN) 1000 MCG tablet Take 1,000 mcg by mouth daily.     No current facility-administered medications for this encounter.    Vitals:   08/28/21 1415  BP: (!) 106/52  Pulse: 66  SpO2: 100%  Weight: 74.7 kg (164 lb 9.6 oz)   Wt Readings from Last 3 Encounters:  08/28/21 74.7 kg (164 lb 9.6 oz)  08/14/21 70.1 kg (154 lb 9.6 oz)  08/07/21 72.1 kg (158 lb 14.4 oz)     PHYSICAL EXAM: General:  Walked in the clinic with her  niece. No resp difficulty HEENT: normal Neck: supple. JVP flat. Carotids 2+ bilaterally; no bruits. No lymphadenopathy or thryomegaly appreciated. Cor: PMI normal. Regular rate & rhythm. No rubs, gallops or murmurs. Lungs: clear Abdomen: soft, nontender, nondistended. No hepatosplenomegaly. No bruits or masses. Good bowel sounds. Extremities: no cyanosis, clubbing, rash, edema Neuro: alert & orientedx3, cranial nerves grossly intact. Moves all 4 extremities w/o difficulty. Affect pleasant.   ECG: SR 68 bpm personally checked   ASSESSMENT & PLAN:  1. Chronic Systolic Heart Failure - Mainly ICM but ? Component of NICM  - Echo 07/2020 at the time of large CVA revealed LVEF 35 to 40%. She was also COVID + ? Component of stressed induced CM  - LHC 7/22 w/ multivessel CAD including total occlusion of the proximal to mid LAD with left to left and right to left collaterals. First diagonal is large and also fills by collaterals, 60% m-dRCA disease, treated medically  - cMRI 10/22 most c/w ICM. While she does bilateral carpal tunnel syndrome and spinal stenosis (possible extracardiac manifestations of amyloid), her cMRI was not suggestive of infiltrative process  - Echo 07/2021 w/ further drop in LVEF, down to 20-25%, RV mildly reduced. No recent CP. Hs trop during admission mildly elevated but level and trend not c/w ACS - Plan is to further optimize GDMT and repeat echo in 3 months. If EF not improving, Cardiology to plan repeat LHC to r/o coronary  disease progression +/- ICD - NYHA II-III - Reds Clip 39% . Instructed to start torsemide 20 mg M-W-F. And take extra 20 mg torsemide when weight is 160 or greater . -Off bb due to fatigue and  bradycardia - Continue Entresto to 49-51 mg bid  - Continue Spironolactone 12.5 mg daily  - Continue farxiga 10 mg daily  - If she remains symptomatic, despite further optimization of above meds, would consider addition of digoxin pending trajectory of renal fx    - Check BMET next visit.  - Dr Haroldine Laws reviewed ECHO, CMRI, and previous cath. Cath not felt to be beneficial given scar.  - Not a candidate for advanced therapies with co-morbidities.  - Could consider Impulse CCM    2. CAD - Cath 7/22>>100% occlusion of the mid LAD and D2.  She had left to left and right to left collaterals.  She also had 50% stenosis in the mid LAD and 70% distal OM 3 stenoses.  She had 60% stenosis in the RCA.CMRI with extensive scar.  - treating medically. Cath not felt to add anything.  - continue statin - no ASA w/ Eliqus use - Off BB due to fatigue and bradycardia  - Refer to cardiac rehab.    3. PAF -In SR today.  -  continue Eliquis. Denies abnormal bleeding    4. Stage III CKD  - B/l SCr ~1.5 - On farxiga 10 mg daily    5. H/o CVA - [id number], cardioembolic in setting of PAF and reduced LVEF w/ ? LV thrombus  - continue Eliquis indefinitely for secondary prevention     Follow up in 3 weeks.   Darrick Grinder NP-C  4:47 PM'  Patient seen and examined with the above-signed Advanced Practice Provider and/or Housestaff. I personally reviewed laboratory data, imaging studies and relevant notes. I independently examined the patient and formulated the important aspects of the plan. I have edited the note to reflect any of my changes or salient points. I have personally discussed the plan with the patient and/or family.  76 y/o woman with CAD, CKD 3b, chronic systolic HF EF 0000000 due to iCM.   Currently NYHA II-III. Mild volume overload. Titrating GDMT.   General:  Well appearing. No resp difficulty HEENT: normal Neck: supple. JVP 8. Carotids 2+ bilat; no bruits. No lymphadenopathy or thryomegaly appreciated. Cor: PMI nondisplaced. Regular rate & rhythm. No rubs, gallops or murmurs. Lungs: clear Abdomen: soft, nontender, nondistended. No hepatosplenomegaly. No bruits or masses. Good bowel sounds. Extremities: no cyanosis, clubbing, rash, 1+ edema Neuro: alert &  orientedx3, cranial nerves grossly intact. moves all 4 extremities w/o difficulty. Affect pleasant  Overall seems to be improving but still volume overloaded. [place] torsemide as above.  I have reviewed cath and cMRI. Doubt any role for attempted revascularization at this point as it would likely not improve EF and she is not having anginal symptoms. Continue aggressive medical Rx.  Repeat echo in next few months. If EF not improving will need ICD. May be candidate for Barostim or CCM device as well.   Total time spent 45 minutes. Over half that time spent discussing above.    Glori Bickers, MD  6:18 PM

## 2021-08-28 NOTE — Patient Instructions (Signed)
Take Torsemide 20 mg every Monday, Wednesday, and Friday  **Take extra tab for weight 160 lbs or greater  You have been referred to Cardiac Rehab, they will call you to schedule  Your physician recommends that you schedule a follow-up appointment in: 3 weeks  Do the following things EVERYDAY: Weigh yourself in the morning before breakfast. Write it down and keep it in a log. Take your medicines as prescribed Eat low salt foods--Limit salt (sodium) to 2000 mg per day.  Stay as active as you can everyday Limit all fluids for the day to less than 2 liters  If you have any questions or concerns before your next appointment please send [place] a message through [place] or call our office at [phone].    TO LEAVE A MESSAGE FOR THE NURSE SELECT OPTION 2, PLEASE LEAVE A MESSAGE INCLUDING: YOUR NAME DATE OF BIRTH CALL BACK NUMBER REASON FOR CALL**this is important as we prioritize the call backs  YOU WILL RECEIVE A CALL BACK THE SAME DAY AS LONG AS YOU CALL BEFORE 4:00 PM  At the [facility], you and your health needs are our priority. As part of our continuing mission to provide you with exceptional heart care, we have created designated Provider Care Teams. These Care Teams include your primary Cardiologist (physician) and Advanced Practice Providers (APPs- Physician Assistants and Nurse Practitioners) who all work together to provide you with the care you need, when you need it.   You may see any of the following providers on your designated Care Team at your next follow up: Dr Arvilla Meres Dr Carron Curie, NP Robbie Lis, [place] [facility] [place], [place] Karle Plumber, PharmD   Please be sure to bring in all your medications bottles to every appointment.

## 2021-08-28 NOTE — Telephone Encounter (Signed)
Call attempted to confirm HV TOC appt 2 pm on 08/28/21. HIPPA appropriate VM left with callback number.  Spoke with HPOA ( [place])   Rhae Hammock, BSN, RN Heart Failure Teacher, adult education Only

## 2021-08-29 NOTE — Telephone Encounter (Signed)
Forms faxed, confirmation received

## 2021-09-02 ENCOUNTER — Telehealth (HOSPITAL_BASED_OUTPATIENT_CLINIC_OR_DEPARTMENT_OTHER): Payer: Self-pay

## 2021-09-02 NOTE — Telephone Encounter (Signed)
Farxiga patient assistance approved, patient notified

## 2021-09-05 ENCOUNTER — Telehealth (HOSPITAL_BASED_OUTPATIENT_CLINIC_OR_DEPARTMENT_OTHER): Payer: Self-pay

## 2021-09-05 NOTE — Telephone Encounter (Signed)
Called patient to let her know that we need her most recent tax form to submit a complete application. They are going to work on finding this and bringing it to the office.

## 2021-09-17 ENCOUNTER — Encounter (HOSPITAL_COMMUNITY): Payer: Medicare Other

## 2021-09-17 ENCOUNTER — Other Ambulatory Visit (HOSPITAL_COMMUNITY): Payer: Self-pay

## 2021-09-30 DIAGNOSIS — N3000 Acute cystitis without hematuria: Secondary | ICD-10-CM | POA: Diagnosis not present

## 2021-09-30 DIAGNOSIS — R300 Dysuria: Secondary | ICD-10-CM | POA: Diagnosis not present

## 2021-10-03 ENCOUNTER — Ambulatory Visit (HOSPITAL_BASED_OUTPATIENT_CLINIC_OR_DEPARTMENT_OTHER): Payer: Medicare Other | Attending: Pulmonary Disease | Admitting: Pulmonary Disease

## 2021-10-03 DIAGNOSIS — J9602 Acute respiratory failure with hypercapnia: Secondary | ICD-10-CM

## 2021-10-03 DIAGNOSIS — G4733 Obstructive sleep apnea (adult) (pediatric): Secondary | ICD-10-CM | POA: Diagnosis not present

## 2021-10-03 DIAGNOSIS — J9601 Acute respiratory failure with hypoxia: Secondary | ICD-10-CM | POA: Diagnosis not present

## 2021-10-07 ENCOUNTER — Other Ambulatory Visit: Payer: Self-pay | Admitting: Internal Medicine

## 2021-10-07 DIAGNOSIS — E1122 Type 2 diabetes mellitus with diabetic chronic kidney disease: Secondary | ICD-10-CM

## 2021-10-08 ENCOUNTER — Telehealth: Payer: Self-pay | Admitting: Pulmonary Disease

## 2021-10-08 DIAGNOSIS — I509 Heart failure, unspecified: Secondary | ICD-10-CM | POA: Diagnosis not present

## 2021-10-08 DIAGNOSIS — I480 Paroxysmal atrial fibrillation: Secondary | ICD-10-CM | POA: Diagnosis not present

## 2021-10-08 DIAGNOSIS — G4733 Obstructive sleep apnea (adult) (pediatric): Secondary | ICD-10-CM | POA: Diagnosis not present

## 2021-10-08 DIAGNOSIS — E1121 Type 2 diabetes mellitus with diabetic nephropathy: Secondary | ICD-10-CM | POA: Diagnosis not present

## 2021-10-08 DIAGNOSIS — M797 Fibromyalgia: Secondary | ICD-10-CM | POA: Diagnosis not present

## 2021-10-08 DIAGNOSIS — I679 Cerebrovascular disease, unspecified: Secondary | ICD-10-CM | POA: Diagnosis not present

## 2021-10-08 DIAGNOSIS — E7800 Pure hypercholesterolemia, unspecified: Secondary | ICD-10-CM | POA: Diagnosis not present

## 2021-10-08 DIAGNOSIS — J9602 Acute respiratory failure with hypercapnia: Secondary | ICD-10-CM

## 2021-10-08 DIAGNOSIS — I10 Essential (primary) hypertension: Secondary | ICD-10-CM | POA: Diagnosis not present

## 2021-10-08 DIAGNOSIS — D649 Anemia, unspecified: Secondary | ICD-10-CM | POA: Diagnosis not present

## 2021-10-08 NOTE — Telephone Encounter (Signed)
PSG 10/03/21 >> AHI 25.6, SpO2 low 70%   Please inform her that her sleep study shows moderate obstructive sleep apnea.  Please arrange for ROV with me or NP to discuss treatment options.

## 2021-10-08 NOTE — Procedures (Signed)
    Patient Name: Yolanda Saunders, Yolanda Saunders Date: 10/03/2021 Gender: Female D.O.B: 03-Jul-1945 Age (years): 76 Referring Provider: Kipp Brood Height (inches): 61 Interpreting Physician: Chesley Mires MD, ABSM Weight (lbs): 160 RPSGT: Laren Everts BMI: 28 MRN: [id number] Neck Size: 14.75  CLINICAL INFORMATION Sleep Study Type: NPSG  Indication for sleep study: Diabetes, Fatigue, Hypertension, Morning Headaches, Obesity, Snoring  Epworth Sleepiness Score: 2  SLEEP STUDY TECHNIQUE As per the AASM Manual for the Scoring of Sleep and Associated Events v2.3 (April 2016) with a hypopnea requiring 4% desaturations.  The channels recorded and monitored were frontal, central and occipital EEG, electrooculogram (EOG), submentalis EMG (chin), nasal and oral airflow, thoracic and abdominal wall motion, anterior tibialis EMG, snore microphone, electrocardiogram, and pulse oximetry.  MEDICATIONS Medications self-administered by patient taken the night of the study : Eliquis, Entresto, GABAPENTIN, ATIVAN  SLEEP ARCHITECTURE The study was initiated at 10:47:22 PM and ended at 5:25:39 AM.  Sleep onset time was 12.9 minutes and the sleep efficiency was 92.9%%. The total sleep time was 370 minutes.  Stage REM latency was 165.0 minutes.  The patient spent 7.8%% of the night in stage N1 sleep, 80.1%% in stage N2 sleep, 0.0%% in stage N3 and 12% in REM.  Alpha intrusion was absent.  Supine sleep was 25.93%.  RESPIRATORY PARAMETERS The overall apnea/hypopnea index (AHI) was 25.6 per hour. There were 3 total apneas, including 2 obstructive, 1 central and 0 mixed apneas. There were 155 hypopneas and 11 RERAs.  The AHI during Stage REM sleep was 8.1 per hour.  AHI while supine was 52.5 per hour.  The mean oxygen saturation was 92.4%. The minimum SpO2 during sleep was 70.0%.  moderate snoring was noted during this study.  CARDIAC DATA The 2 lead EKG demonstrated sinus rhythm. The mean  heart rate was 58.6 beats per minute. Other EKG findings include: None.  LEG MOVEMENT DATA The total PLMS were 0 with a resulting PLMS index of 0.0. Associated arousal with leg movement index was 0.0 .  IMPRESSIONS - Moderate obstructive sleep apnea with an AHI of 25.6 and SpO2 low of 70%. - The patient snored with moderate snoring volume.  DIAGNOSIS - Obstructive Sleep Apnea (G47.33)  RECOMMENDATIONS - Additional therapies include CPAP, oral appliance, or surgical assessment. - Avoid alcohol, sedatives and other CNS depressants that may worsen sleep apnea and disrupt normal sleep architecture. - Sleep hygiene should be reviewed to assess factors that may improve sleep quality. - Weight management and regular exercise should be initiated or continued if appropriate.  [Electronically signed] 10/08/2021 09:11 PM  Chesley Mires MD, ABSM Diplomate, American Board of Sleep Medicine NPI: [id number]  [place] PH: [phone]   FX: [phone] [place]

## 2021-10-09 NOTE — Telephone Encounter (Signed)
Atc. Lmtcb  

## 2021-10-10 NOTE — Telephone Encounter (Signed)
Atc lmtcb. Will mail a letter asking patient to return call

## 2021-10-15 ENCOUNTER — Telehealth: Payer: Self-pay | Admitting: Pulmonary Disease

## 2021-10-15 NOTE — Telephone Encounter (Signed)
Called and spoke to [place] (ok per dpr) and gave her results. She voiced understanding. Scheduled an ov with TP NP to discuss treatment options. Nothing further needed

## 2021-10-15 NOTE — Telephone Encounter (Signed)
Called pts niece. See prev. Phone encounter. Nothing further needed

## 2021-10-23 ENCOUNTER — Other Ambulatory Visit (HOSPITAL_COMMUNITY): Payer: Self-pay | Admitting: *Deleted

## 2021-10-23 DIAGNOSIS — I5022 Chronic systolic (congestive) heart failure: Secondary | ICD-10-CM

## 2021-10-24 ENCOUNTER — Ambulatory Visit: Payer: Medicare Other | Admitting: Adult Health

## 2021-10-24 ENCOUNTER — Encounter: Payer: Self-pay | Admitting: Adult Health

## 2021-10-24 DIAGNOSIS — I5042 Chronic combined systolic (congestive) and diastolic (congestive) heart failure: Secondary | ICD-10-CM | POA: Diagnosis not present

## 2021-10-24 DIAGNOSIS — G4733 Obstructive sleep apnea (adult) (pediatric): Secondary | ICD-10-CM | POA: Diagnosis not present

## 2021-10-24 NOTE — Progress Notes (Signed)
Reviewed and agree with assessment/plan.   Chesley Mires, MD [facility] Pulmonary/Critical Care 10/24/2021, 3:50 PM Pager:  [phone]

## 2021-10-24 NOTE — Patient Instructions (Signed)
Begin CPAP At bedtime  , wear all night long for at least 6hr or more   Work on healthy weight  Use caution with sedating medications.  Follow up in 3 months with Dr. Halford Chessman  or Gricelda Foland NP and As needed

## 2021-10-24 NOTE — Assessment & Plan Note (Signed)
Moderate obstructive sleep apnea.  Patient education given on sleep apnea.  We discussed treatment options.  Patient will proceed with CPAP therapy begin CPAP AutoSet 5 to 15 cm H2O.  - discussed how weight can impact sleep and risk for sleep disordered breathing - discussed options to assist with weight loss: combination of diet modification, cardiovascular and strength training exercises   - had an extensive discussion regarding the adverse health consequences related to untreated sleep disordered breathing - specifically discussed the risks for hypertension, coronary artery disease, cardiac dysrhythmias, cerebrovascular disease, and diabetes - lifestyle modification discussed   - discussed how sleep disruption can increase risk of accidents, particularly when driving - safe driving practices were discussed   Plan  Patient Instructions  Begin CPAP At bedtime  , wear all night long for at least 6hr or more   Work on healthy weight  Use caution with sedating medications.  Follow up in 3 months with Dr. Halford Chessman  or Pretty Weltman NP and As needed

## 2021-10-24 NOTE — Progress Notes (Signed)
@Patient  ID: Yolanda Saunders, female    DOB: August 19, 1945, 76 y.o.   MRN: [id number]  Chief Complaint  Patient presents with   Follow-up    Referring provider: Shirline Frees, MD  HPI: 76 year old female seen for pulmonary consult during hospitalization for critical illness with acute hypoxic respiratory failure due to decompensated heart failure July 2023 Medical history significant for ischemic cardiomyopathy, diabetes, stroke  TEST/EVENTS :  PSG 10/03/21 >> AHI 25.6, SpO2 low 70%  7/6 Echocardiogram: EF 25% with global hypokinesis. Grade II diastolic dysfunction. EF has decreased since 2022.   10/24/2021 Follow up ; OSA  Patient returns for a posthospital follow-up.  Patient was seen for pulmonary consult during hospitalization July 2023.  Patient developed acute shortness of breath and edema.  EMS was called with initial O2 saturations at 50%.  She was attempted to be treated with CPAP.  Patient did have loss of consciousness and PEA arrest in route to the hospital.  Was started with ROSC.  She regained consciousness in the emergency room but had mental status changes with agitation and vomiting.  She required intubation.   Found to have decompensated CHF.  Echo showed EF worsening at 25%.  Grade 2 diastolic dysfunction.  Clinically improved with diuresis.  She was suspected to have underlying sleep apnea.  She was changed to [facility].  And started on spironolactone and [place].  She was recommended for outpatient sleep study.  This was done October 03, 2021 and showed moderate sleep apnea with AHI 25.6/hour and SPO2 low at 70%.  We discussed her sleep study results in detail.  Went over treatment options.  Patient will begin CPAP at bedtime. Patient has significant daytime sleepiness and snoring.  Family members report very loud snoring.   Allergies  Allergen Reactions   Codeine Other (See Comments)    "makes me crazy; see things; delusional"   Erythromycin Rash and Other (See  Comments)    Peeling skin Skin turned "purplish"    Penicillins Rash and Other (See Comments)    Skin blisters   Shellfish Allergy Anaphylaxis and Swelling    Throat swells   Sulfonamide Derivatives Hives   Zestril [Lisinopril] Cough   Toprol Xl [Metoprolol] Diarrhea and Nausea And Vomiting    Rapid weight gain Hallucinations    Actos [Pioglitazone] Other (See Comments)    Upset GI   Aldactone [Spironolactone] Other (See Comments)    Unknown reaction   Amaryl [Glimepiride] Other (See Comments)    Upset GI   Apresoline [Hydralazine] Other (See Comments)    Unknown reaction   Benicar [Olmesartan] Other (See Comments)    Dizziness Headaches   [place]etta 10 Mcg Pen [Exenatide] Nausea And Vomiting   Catapres [Clonidine Hcl] Other (See Comments)    Unknown reaction   Flexeril [Cyclobenzaprine] Other (See Comments)    Drowsiness    Glucotrol [Glipizide] Other (See Comments)    Upset GI   Glyburide-Metformin Other (See Comments)    Myalgias   Humalog [Insulin Lispro] Other (See Comments)    Headache Severe hyperglycemia   Invokana [Canagliflozin] Other (See Comments)    Yeast infections   Januvia [Sitagliptin] Other (See Comments)    UTI   Neurontin [Gabapentin] Other (See Comments)    Anxiety    Norvasc [Amlodipine] Other (See Comments)    Syncope   Prednisone Other (See Comments)    Unknown reaction   Propoxyphene Other (See Comments)    Unknown reaction Darvocet-N   Reglan [Metoclopramide] Other (See Comments)  Unknown reaction   Septra [Sulfamethoxazole-Trimethoprim] Hives   Ultram [Tramadol] Other (See Comments)    Anxiety    Victoza [Liraglutide] Diarrhea    Upset GI   Zocor [Simvastatin] Other (See Comments)    Myalgias    Cozaar [Losartan Potassium] Rash    Upset GI   Novolin 70-30 [Insulin Nph Isophane & Regular] Swelling and Rash   Sulfa Antibiotics Hives and Rash    Immunization History  Administered Date(s) Administered   Influenza, High Dose  Seasonal PF 11/01/2015, 11/27/2016   Influenza-Unspecified 09/21/2010   Pneumococcal-Unspecified 10/21/2010    Past Medical History:  Diagnosis Date   Anemia 04/17/11   "long, long years ago"   Angina 04/16/11   "that's what I'm here for"   Anxiety    Arthritis    Hips and knees   Asthma    CAD in native artery 08/07/2021   Carpal tunnel syndrome    Cataracts, bilateral    Chronic combined systolic and diastolic heart failure ([place]) 08/07/2020   CKD (chronic kidney disease), stage III (HCC)    Complication of anesthesia 1987   "affected my eyes; couldn't see anything but blurrs even the next day"   CVA (cerebral infarction)    After cardiac catheter 02/2000   Depression    Edema    Fibromyalgia    GERD (gastroesophageal reflux disease)    Headache(784.0)    High cholesterol    History of bronchitis    Hypertension    IBS (irritable bowel syndrome)    Migraines    PAF (paroxysmal atrial fibrillation) ([place]) 08/07/2021   Panic attacks 04/17/11   "don't take anything for it"   Pneumonia 04/17/11   "probably as many as 7 times"   Renal disorder 04/17/11   "they are working at 60% capacity"   Shortness of breath on exertion    "cause of my asthma"   Stroke ([place]) 2002   residual "problem w/using the right word, left 5 lesions on my brain/MRI; long term memory loss"   Type II diabetes mellitus ([place])     Tobacco History: Social History   Tobacco Use  Smoking Status Former   Packs/day: 0.75   Years: 6.00   Total pack years: 4.50   Types: Cigarettes   Quit date: 01/21/1980   Years since quitting: 41.7  Smokeless Tobacco Never   Counseling given: Not Answered   Outpatient Medications Prior to Visit  Medication Sig Dispense Refill   apixaban (ELIQUIS) 5 MG TABS tablet Take 1 tablet by mouth twice daily 60 tablet 5   atorvastatin (LIPITOR) 80 MG tablet Take 1 tablet (80 mg total) by mouth daily. 30 tablet 0   Cholecalciferol (VITAMIN D-3 PO) Take 1 capsule by mouth daily.      dapagliflozin propanediol (FARXIGA) 10 MG TABS tablet Take 1 tablet (10 mg total) by mouth daily. 90 tablet 3   gabapentin (NEURONTIN) 100 MG capsule Take 100 mg by mouth at bedtime.     glucose blood (ONETOUCH VERIO) test strip USE AS DIRECTED TO  CHECK  BLOOD  SUGAR  FOUR  TIMES  DAILY 100 each 0   insulin regular (NOVOLIN R) 100 units/mL injection Inject 10-14 Units into the skin 3 (three) times daily before meals. Per sliding scale. OTC Novolin R.     pantoprazole (PROTONIX) 40 MG tablet Take 1 tablet (40 mg total) by mouth daily. 30 tablet 0   sacubitril-valsartan (ENTRESTO) 49-51 MG Take 1 tablet by mouth 2 (two) times daily.  180 tablet 3   sertraline (ZOLOFT) 50 MG tablet Take 50 mg by mouth daily.     spironolactone (ALDACTONE) 25 MG tablet Take 1/2 tablet (12.5 mg total) by mouth daily. 30 tablet 1   torsemide (DEMADEX) 20 MG tablet Take 1 tablet (20 mg total) by mouth once a week. Every Mon, Wed, and Fri, take extra dose for weight 160 lbs or greater 21 tablet 3   vitamin B-12 (CYANOCOBALAMIN) 1000 MCG tablet Take 1,000 mcg by mouth daily.     No facility-administered medications prior to visit.     Review of Systems:   Constitutional:   No  weight loss, night sweats,  Fevers, chills, + fatigue, or  lassitude.  HEENT:   No headaches,  Difficulty swallowing,  Tooth/dental problems, or  Sore throat,                No sneezing, itching, ear ache, nasal congestion, post nasal drip,   CV:  No chest pain,  Orthopnea, PND, swelling in lower extremities, anasarca, dizziness, palpitations, syncope.   GI  No heartburn, indigestion, abdominal pain, nausea, vomiting, diarrhea, change in bowel habits, loss of appetite, bloody stools.   Resp: No shortness of breath with exertion or at rest.  No excess mucus, no productive cough,  No non-productive cough,  No coughing up of blood.  No change in color of mucus.  No wheezing.  No chest wall deformity  Skin: no rash or lesions.  GU: no  dysuria, change in color of urine, no urgency or frequency.  No flank pain, no hematuria   MS:  No joint pain or swelling.  No decreased range of motion.  No back pain.    Physical Exam  BP 120/68 (BP Location: Right Arm, Patient Position: Sitting, Cuff Size: Large)   Pulse 73   Temp 97.6 F (36.4 C) (Oral)   Ht 5\' 2"  (1.575 m)   Wt 168 lb 6.4 oz (76.4 kg)   SpO2 97%   BMI 30.80 kg/m   GEN: A/Ox3; pleasant , NAD, well nourished    HEENT:  [place]/AT, NOSE-clear, THROAT-clear, no lesions, no postnasal drip or exudate noted. Class 2 MP airway   NECK:  Supple w/ fair ROM; no JVD; normal carotid impulses w/o bruits; no thyromegaly or nodules palpated; no lymphadenopathy.    RESP  Clear  P & A; w/o, wheezes/ rales/ or rhonchi. no accessory muscle use, no dullness to percussion  CARD:  RRR, no m/r/g, no peripheral edema, pulses intact, no cyanosis or clubbing.  GI:   Soft & nt; nml bowel sounds; no organomegaly or masses detected.   Musco: Warm bil, no deformities or joint swelling noted.   Neuro: alert, no focal deficits noted.    Skin: Warm, no lesions or rashes    Lab Results:       Imaging: SLEEP STUDY DOCUMENTS  Result Date: 10/04/2021 Ordered by an unspecified provider.         No data to display          No results found for: "NITRICOXIDE"      Assessment & Plan:   OSA (obstructive sleep apnea) Moderate obstructive sleep apnea.  Patient education given on sleep apnea.  We discussed treatment options.  Patient will proceed with CPAP therapy begin CPAP AutoSet 5 to 15 cm H2O.  - discussed how weight can impact sleep and risk for sleep disordered breathing - discussed options to assist with weight loss: combination of diet modification, cardiovascular  and strength training exercises   - had an extensive discussion regarding the adverse health consequences related to untreated sleep disordered breathing - specifically discussed the risks for  hypertension, coronary artery disease, cardiac dysrhythmias, cerebrovascular disease, and diabetes - lifestyle modification discussed   - discussed how sleep disruption can increase risk of accidents, particularly when driving - safe driving practices were discussed   Plan  Patient Instructions  Begin CPAP At bedtime  , wear all night long for at least 6hr or more   Work on healthy weight  Use caution with sedating medications.  Follow up in 3 months with Dr. Halford Chessman  or Kursten Kruk NP and As needed       Chronic combined systolic and diastolic heart failure ([place]) CHF appears euvolemic on exam continue follow-up with cardiology and continue on current maintenance regimen     Marston Mccadden, NP 10/24/2021

## 2021-10-24 NOTE — Assessment & Plan Note (Signed)
CHF appears euvolemic on exam continue follow-up with cardiology and continue on current maintenance regimen

## 2021-11-07 ENCOUNTER — Other Ambulatory Visit (HOSPITAL_COMMUNITY): Payer: Medicare Other

## 2021-11-08 DIAGNOSIS — L03039 Cellulitis of unspecified toe: Secondary | ICD-10-CM | POA: Diagnosis not present

## 2021-11-08 DIAGNOSIS — N390 Urinary tract infection, site not specified: Secondary | ICD-10-CM | POA: Diagnosis not present

## 2021-11-11 DIAGNOSIS — L03039 Cellulitis of unspecified toe: Secondary | ICD-10-CM | POA: Diagnosis not present

## 2021-11-14 ENCOUNTER — Ambulatory Visit: Payer: Medicare Other | Admitting: Internal Medicine

## 2021-11-14 ENCOUNTER — Encounter: Payer: Self-pay | Admitting: Internal Medicine

## 2021-11-14 ENCOUNTER — Other Ambulatory Visit: Payer: Self-pay

## 2021-11-14 VITALS — BP 102/76 | HR 64 | Ht 62.0 in | Wt 166.8 lb

## 2021-11-14 DIAGNOSIS — N183 Chronic kidney disease, stage 3 unspecified: Secondary | ICD-10-CM

## 2021-11-14 DIAGNOSIS — E1122 Type 2 diabetes mellitus with diabetic chronic kidney disease: Secondary | ICD-10-CM

## 2021-11-14 DIAGNOSIS — Z794 Long term (current) use of insulin: Secondary | ICD-10-CM | POA: Diagnosis not present

## 2021-11-14 DIAGNOSIS — E785 Hyperlipidemia, unspecified: Secondary | ICD-10-CM | POA: Diagnosis not present

## 2021-11-14 LAB — POCT GLYCOSYLATED HEMOGLOBIN (HGB A1C): Hemoglobin A1C: 7.5 % — AB (ref 4.0–5.6)

## 2021-11-14 MED ORDER — ONETOUCH VERIO VI STRP: ORAL_STRIP | 0 refills | Status: DC

## 2021-11-14 MED ORDER — INSULIN GLARGINE 100 UNIT/ML ~~LOC~~ SOLN: 8.0000 [IU] | Freq: Every day | SUBCUTANEOUS | 11 refills | Status: DC

## 2021-11-14 MED ORDER — INSULIN PEN NEEDLE 32G X 4 MM MISC: 3 refills | Status: DC

## 2021-11-14 MED ORDER — LANTUS SOLOSTAR 100 UNIT/ML ~~LOC~~ SOPN: 8.0000 [IU] | PEN_INJECTOR | Freq: Every day | SUBCUTANEOUS | 3 refills | Status: DC

## 2021-11-14 NOTE — Patient Instructions (Addendum)
Please continue: - Farxiga 10 mg before b'fast  Please reduce: - R insulin: 6-10 units 30 min before meals  Start: - Lantus 8(-10) units daily in am  Check sugars carefully and may need to reduce the R insulin dose further if you have low blood sugars.  Please return in 3-4 months with your sugar log.

## 2021-11-14 NOTE — Progress Notes (Signed)
Patient ID: Yolanda Saunders, female   DOB: 08/31/1945, 76 y.o.   MRN: [id number]  HPI: Yolanda Saunders is a 76 y.o.-year-old female, returning for follow-up for DM2, dx 1999, insulin-dependent since ~2012, uncontrolled, with complications (CHF, CKD stage 3, h/o stroke post cardiac cath in 2002 and 06/2020, macroalbuminuria). She saw Dr Buddy Duty in the past.  Last visit 1.5 years ago.  She is here with her niece, who is her caregiver.  Interim history: At last visit, she had significant stress taking care of her husband however, her husband passed away since last visit.  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 Also has dyslexia after the stroke.  She also was dx'ed wih CHF since last OV.  She was started on [place]. No increased urination, nausea, chest pain.  Last hemoglobin A1c was: Lab Results  Component Value Date   HGBA1C 7.8 (H) 07/25/2021   HGBA1C 7.2 (H) 08/03/2020   HGBA1C 8.0 (H) 05/30/2020  09/21/2019: HbA1c 11.5% 07/31/2014: 13.4%   In the past, she had a period in which she could not afford analog insulin products  >> we changed to:  Insulin Before breakfast Before lunch Before dinner  Regular 25 - smaller meal 30 - larger meal 25 - smaller meal 30 - larger meal 25 - smaller meal 30 - larger meal  NPH 40  30    Currently on: - Lantus 40 units twice a day >> (vials)  off >> restarted 03/2020: 30 >> 20 units in am >> off - Regular (ReliOn): 34 >> 15-20 >> 8-12 units before meals - Farxiga 10 mg before b'fast - added this summer Try to take 5-10 units if you have a snack after dinner.  We stopped Invokana 100 mg before b'fast >> lethargy, fatigue -  in 11/2015 I suggested Bydureon 2 mg weekly - in donut hole >> could not start Tried Victoza >> helped, but AP She stopped Metformin ER 500 mg 2x a day because of AP, but does have IBS. She feels better after she  stopped Tried Byetta >> N/V Tried Metformin >> AP, diarrhea, gas Tried SU >> upset stomach, nausea Has been on Invokana before >> yeast inf She had elevated lipase (72) in 09/2011 while on Victoza.  She also reportedly had pancreatitis at the beginning of 2019.  Patient checks blood sugars sugars 2-3x a day: - am: 47, 52-119, 169, 173, 290 (ate at night b/c lows) >> 115-182 - 2h after b'fast: 58, 214-265 >> 218 >> n/c >> 62, 64 >> n/c - before lunch:  62-144 >> 353/130s >> 63, 68-132, 162 >> n/c  - 2h after lunch:   285 >> 355/n/c >> 73-199, 225, 266 >> 30 min after: 157-265 - before dinner: 257, 285 >> 395/180s >> 86-199 >> n/c - 2h after dinner: 175-307 >> 223 >> 425/180-185 >> 73-160 >> 93-173 - bedtime:  196, 311 >> 409 >> 56, 102-153, 202, 205 >> n/c - nighttime:  321, 332 >> 252-349 >> 402 >> 46, 178 >> n/c Lowest sugar was: 42 >> 46 >> 40s - at the [place].  she has hypoglycemia awareness in the 60s Highest sugar was 600s on Humalog >> 400-500 >> 300s >> 200s.   Glucometer: Free Style  Pt's meals are: - Breakfast: oatmeal - Lunch: sandwich; chicken nuggets and fries - Dinner: heaviest: meat + 2 veggies +  dessert later - Snacks: "too many" - cookies, sugary snacks  -+ CKD, last BUN/creatinine: Lab Results  Component Value Date   BUN 26 (H) 08/14/2021   CREATININE 1.73 (H) 08/14/2021  Received labs from PCP 09/21/2019: HbA1c 11.5%, glucose 373, BUN/creatinine 21/1.41, GFR 36  + MAU: Lab Results  Component Value Date   MICRALBCREAT 32.0 (H) 02/11/2016  04/2014: ACR: >300 She sees nephrology. Intolerant to ACEI/ARBs. On Entresto.  -+ HL; last set of lipids: Lab Results  Component Value Date   CHOL 114 09/25/2020   HDL 33 (L) 09/25/2020   LDLCALC 59 09/25/2020   LDLDIRECT 144.0 09/24/2018   TRIG 39 07/26/2021   CHOLHDL 3.5 09/25/2020  09/21/2019: 236/255/37/151 07/09/2016: 175/122/36/150 On Lipitor 80 << 40.  - last eye exam was in 2022: + DR. She had cataract  surgery since last visit.  -She denies numbness and tingling in her feet.  She was seeing Dr. Narda Amber >> investigation for disequllibrium and word difficulty. ? MS. She also has HTN,  anemia, GERD.  During investigation of her back pain in 02/2020, she was found to have diverticulitis.  This was treated.  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  ROS: + see HPI  I reviewed pt's medications, allergies, PMH, social hx, family hx, and changes were documented in the history of present illness. Otherwise, unchanged from my initial visit note.  Past Medical History:  Diagnosis Date   Anemia 04/17/11   "long, long years ago"   Angina 04/16/11   "that's what I'm here for"   Anxiety    Arthritis    Hips and knees   Asthma    CAD in native artery 08/07/2021   Carpal tunnel syndrome    Cataracts, bilateral    Chronic combined systolic and diastolic heart failure ([name]) 08/07/2020   CKD (chronic kidney disease), stage III ([place])    Complication of anesthesia 1987   "affected my eyes; couldn't see anything but blurrs even the next day"   CVA (cerebral infarction)    After cardiac catheter 02/2000   Depression    Edema    Fibromyalgia    GERD (gastroesophageal reflux disease)    Headache(784.0)    High cholesterol    History of bronchitis    Hypertension    IBS (irritable bowel syndrome)    Migraines    PAF (paroxysmal atrial fibrillation) ([place]) 08/07/2021   Panic attacks 04/17/11   "don't take anything for it"   Pneumonia 04/17/11   "probably as many as 7 times"   Renal disorder 04/17/11   "they are working at 60% capacity"   Shortness of breath on exertion    "cause of my asthma"   Stroke ([place]) 2002   residual "problem w/using the right word, left 5 lesions on my brain/MRI; long term memory loss"   Type II diabetes mellitus ([place])    Past Surgical History:  Procedure Laterality Date   CARDIAC CATHETERIZATION  2002   CARPAL TUNNEL RELEASE  2003-2010   "twice on left; once on  right"   [place]   EYE SURGERY Bilateral 11/2019   Lenses Implant   histerectomy     LUMBAR LAMINECTOMY/ DECOMPRESSION WITH MET-RX Right 06/01/2020   Procedure: Right Lumbar Four-Five Minimally invasive discectomy;  Surgeon: Judith Part, MD;  Location: [facility];  Service: Neurosurgery;  Laterality: Right;   RIGHT/LEFT HEART CATH AND CORONARY ANGIOGRAPHY N/A 08/07/2020   Procedure: RIGHT/LEFT  HEART CATH AND CORONARY ANGIOGRAPHY;  Surgeon: Belva Crome, MD;  Location: [facility];  Service: Cardiovascular;  Laterality: N/A;   VAGINAL HYSTERECTOMY  1977   Social History   Social History   Marital Status: Married   Social History Main Topics   Smoking status: Former Smoker -- 0.75 packs/day for 6 years    Types: Cigarettes    Quit date: 01/21/1980   Smokeless tobacco: Never Used   Alcohol Use: No   Drug Use: No   Social History Narrative   Lives with husband in a one story home.  Has 2 children.  Retired from [employer].  Education: 12th grade.  Trade schools.    Current Outpatient Medications on File Prior to Visit  Medication Sig Dispense Refill   apixaban (ELIQUIS) 5 MG TABS tablet Take 1 tablet by mouth twice daily 60 tablet 5   atorvastatin (LIPITOR) 80 MG tablet Take 1 tablet (80 mg total) by mouth daily. 30 tablet 0   Cholecalciferol (VITAMIN D-3 PO) Take 1 capsule by mouth daily.     dapagliflozin propanediol (FARXIGA) 10 MG TABS tablet Take 1 tablet (10 mg total) by mouth daily. 90 tablet 3   gabapentin (NEURONTIN) 100 MG capsule Take 100 mg by mouth at bedtime.     glucose blood (ONETOUCH VERIO) test strip USE AS DIRECTED TO  CHECK  BLOOD  SUGAR  FOUR  TIMES  DAILY 100 each 0   insulin regular (NOVOLIN R) 100 units/mL injection Inject 10-14 Units into the skin 3 (three) times daily before meals. Per sliding scale. OTC Novolin R.     pantoprazole (PROTONIX) 40 MG tablet Take 1 tablet (40 mg total) by mouth  daily. 30 tablet 0   sacubitril-valsartan (ENTRESTO) 49-51 MG Take 1 tablet by mouth 2 (two) times daily. 180 tablet 3   sertraline (ZOLOFT) 50 MG tablet Take 50 mg by mouth daily.     spironolactone (ALDACTONE) 25 MG tablet Take 1/2 tablet (12.5 mg total) by mouth daily. 30 tablet 1   torsemide (DEMADEX) 20 MG tablet Take 1 tablet (20 mg total) by mouth once a week. Every Mon, Wed, and Fri, take extra dose for weight 160 lbs or greater 21 tablet 3   vitamin B-12 (CYANOCOBALAMIN) 1000 MCG tablet Take 1,000 mcg by mouth daily.     No current facility-administered medications on file prior to visit.   Allergies  Allergen Reactions   Codeine Other (See Comments)    "makes me crazy; see things; delusional"   Erythromycin Rash and Other (See Comments)    Peeling skin Skin turned "purplish"    Penicillins Rash and Other (See Comments)    Skin blisters   Shellfish Allergy Anaphylaxis and Swelling    Throat swells   Sulfonamide Derivatives Hives   Zestril [Lisinopril] Cough   Toprol Xl [Metoprolol] Diarrhea and Nausea And Vomiting    Rapid weight gain Hallucinations    Actos [Pioglitazone] Other (See Comments)    Upset GI   Aldactone [Spironolactone] Other (See Comments)    Unknown reaction   Amaryl [Glimepiride] Other (See Comments)    Upset GI   Apresoline [Hydralazine] Other (See Comments)    Unknown reaction   Benicar [Olmesartan] Other (See Comments)    Dizziness Headaches   Byetta 10 Mcg Pen [Exenatide] Nausea And Vomiting   Catapres [Clonidine Hcl] Other (See Comments)    Unknown reaction   Flexeril [Cyclobenzaprine] Other (See Comments)    Drowsiness  Glucotrol [Glipizide] Other (See Comments)    Upset GI   Glyburide-Metformin Other (See Comments)    Myalgias   Humalog [Insulin Lispro] Other (See Comments)    Headache Severe hyperglycemia   Invokana [Canagliflozin] Other (See Comments)    Yeast infections   Januvia [Sitagliptin] Other (See Comments)    UTI    Neurontin [Gabapentin] Other (See Comments)    Anxiety    Norvasc [Amlodipine] Other (See Comments)    Syncope   Prednisone Other (See Comments)    Unknown reaction   Propoxyphene Other (See Comments)    Unknown reaction Darvocet-N   Reglan [Metoclopramide] Other (See Comments)    Unknown reaction   Septra [Sulfamethoxazole-Trimethoprim] Hives   Ultram [Tramadol] Other (See Comments)    Anxiety    Victoza [Liraglutide] Diarrhea    Upset GI   Zocor [Simvastatin] Other (See Comments)    Myalgias    Cozaar [Losartan Potassium] Rash    Upset GI   Novolin 70-30 [Insulin Nph Isophane & Regular] Swelling and Rash   Sulfa Antibiotics Hives and Rash   Family History  Problem Relation Age of Onset   Coronary artery disease Father    Diabetes Mellitus I Father    CVA Mother    Stroke Mother    Diabetes Mellitus I Mother    Diabetes Mellitus I Sister    Diabetes Mellitus I Brother    Diabetes Mellitus I Maternal Grandmother    Diabetes Mellitus I Maternal Grandfather    Diabetes Mellitus I Paternal Grandmother    Diabetes Mellitus I Paternal Grandfather    Diabetes Mellitus I Brother    Aortic aneurysm Son    PE: BP 102/76 (BP Location: Left Arm, Patient Position: Sitting, Cuff Size: Normal)   Pulse 64   Ht '5\' 2"'  (1.575 m)   Wt 166 lb 12.8 oz (75.7 kg)   SpO2 97%   BMI 30.51 kg/m   Wt Readings from Last 3 Encounters:  11/14/21 166 lb 12.8 oz (75.7 kg)  10/24/21 168 lb 6.4 oz (76.4 kg)  10/03/21 160 lb (72.6 kg)   Constitutional: Slightly overweight, in NAD ENT: no neck masses, no cervical lymphadenopathy Cardiovascular: RRR, No MRG Respiratory: CTA B Musculoskeletal: no deformities Skin:no rashes Neurological: no tremor with outstretched hands  ASSESSMENT: 1. DM2, insulin-dependent, uncontrolled, with complications - CKD stage 3 - h/o stroke post cardiac cath in 2002 - macroalbuminuria >> improved - Lipoatrophy at the site of insulin injections  2. Obesity  class 2 BMI Classification: < 18.5 underweight  18.5-24.9 normal weight  25.0-29.9 overweight  30.0-34.9 class I obesity  35.0-39.9 class II obesity  ? 40.0 class III obesity   3. HL  PLAN:  1. Patient with longstanding, uncontrolled, type 2 diabetes, insulin-dependent, on a basal/bolus insulin regimen, returning after another long absence of 1.5 years.  In 2022, she again returned after another similar absence.  At this visit, we discussed that she can continue to see PCP for this problem, but they would like to continue to follow with me.  In that case, I did advise them to try to come more frequently for appointments.  Niece agrees with the plan. Wilder Glade was added since last visit due to new diagnosis of CHF -At last visit, she had low blood sugars in the 40s to 60s alternating with sugars at goal and occasional high blood sugars when she was trying to correct the lows.  We decrease her regular insulin and adjusted the Lantus dose.  I  also recommended a CGM and gave her a list of suppliers.  At that time, HbA1c was 9.1%.  Most recent HbA1c is from 3 months ago and this was 7.8%, improved. -At today's visit, they tell me that patient is off her long-acting insulin.  She is taking only regular insulin blood sugars are fluctuating during the day, with most increase in blood sugars after lunch.  They are checking blood sugars 30 minutes after lunch and I advised them to move the checks before meals or 2 hours after.  I did recommend to add back basal insulin (in a vial - niece is driving this up for her since patient cannot see well to use a pen) and reduce the regular insulin dose.  We will continue [place]. -Patient's niece tells me that she tried to obtain a CGM for the patient but she was not able to do so - I advised her to:  Patient Instructions  Please continue: - Farxiga 10 mg before b'fast  Please reduce: - R insulin: 6-10 units 30 min before meals  Start: - Lantus 8(-10) units  daily in am  Check sugars carefully and may need to reduce the R insulin dose further if you have low blood sugars.  Please return in 3-4 months with your sugar log.   - we checked her HbA1c: 7.5% (lower) - advised to check sugars at different times of the day - 4x a day, rotating check times - advised for yearly eye exams >> she is UTD - return to clinic in 3-4 months  2. . Obesity class 2 -She continues on insulin, which is weight inducing, however, she is now on Farxiga, which should help with weight loss -However, she lost 27 pounds since our last visit!  3. HL - Reviewed latest lipid panel from 09/2020: LDL above our target of less than 55 due to history of cardiovascular disease, HDL slightly low: Lab Results  Component Value Date   CHOL 114 09/25/2020   HDL 33 (L) 09/25/2020   LDLCALC 59 09/25/2020   LDLDIRECT 144.0 09/24/2018   TRIG 39 07/26/2021   CHOLHDL 3.5 09/25/2020  -Continues on Lipitor 80 mg daily without side effects  Philemon Kingdom, MD PhD [facility] Endocrinology

## 2021-11-20 ENCOUNTER — Ambulatory Visit (HOSPITAL_COMMUNITY)
Admission: RE | Admit: 2021-11-20 | Discharge: 2021-11-20 | Disposition: A | Discharge status: Home / Self Care | Payer: Medicare Other | Source: Ambulatory Visit | Attending: Cardiovascular Disease | Admitting: Cardiovascular Disease

## 2021-11-20 DIAGNOSIS — E785 Hyperlipidemia, unspecified: Secondary | ICD-10-CM | POA: Insufficient documentation

## 2021-11-20 DIAGNOSIS — Z87891 Personal history of nicotine dependence: Secondary | ICD-10-CM | POA: Insufficient documentation

## 2021-11-20 DIAGNOSIS — I480 Paroxysmal atrial fibrillation: Secondary | ICD-10-CM | POA: Diagnosis not present

## 2021-11-20 DIAGNOSIS — E119 Type 2 diabetes mellitus without complications: Secondary | ICD-10-CM | POA: Insufficient documentation

## 2021-11-20 DIAGNOSIS — I351 Nonrheumatic aortic (valve) insufficiency: Secondary | ICD-10-CM | POA: Diagnosis not present

## 2021-11-20 DIAGNOSIS — I10 Essential (primary) hypertension: Secondary | ICD-10-CM | POA: Diagnosis not present

## 2021-11-20 DIAGNOSIS — I5023 Acute on chronic systolic (congestive) heart failure: Secondary | ICD-10-CM | POA: Diagnosis not present

## 2021-11-20 DIAGNOSIS — I2510 Atherosclerotic heart disease of native coronary artery without angina pectoris: Secondary | ICD-10-CM | POA: Diagnosis not present

## 2021-11-20 DIAGNOSIS — I110 Hypertensive heart disease with heart failure: Secondary | ICD-10-CM | POA: Diagnosis not present

## 2021-11-20 LAB — ECHOCARDIOGRAM COMPLETE
Area-P 1/2: 3.26 cm2
P 1/2 time: 443 msec
S' Lateral: 2.2 cm

## 2021-11-21 ENCOUNTER — Telehealth (HOSPITAL_COMMUNITY): Payer: Self-pay | Admitting: *Deleted

## 2021-11-21 ENCOUNTER — Telehealth (HOSPITAL_BASED_OUTPATIENT_CLINIC_OR_DEPARTMENT_OTHER): Payer: Self-pay | Admitting: *Deleted

## 2021-11-21 NOTE — Telephone Encounter (Signed)
Spoke with niece regard patients Echo Patient has been taking Torsemide daily secondary to weights above 160 ranging 166-172 Denies shortness of breath, swelling, or need to sleep sitting up  Has appointment Monday with Dr Oval Linsey  Discussed with Urban Gibson and will have patient continue same dose of medications and discuss at visit Monday

## 2021-11-21 NOTE — Telephone Encounter (Signed)
Pts caregiver called stating she was instructed to give pt extra torsemide if her weight was above 160lbs. She said pts weight has consistently been above 160lbs but pt is asymptomatic and she thinks pt has just actually gained weight. She asked if she needs to continue to give pt extra torsemide daily.  Routed to [place]

## 2021-11-22 NOTE — Telephone Encounter (Signed)
Pt has f/u with Dr.Lycoming. caregiver said pt is asymptomatic and will continue current dose of torsemide.

## 2021-11-25 ENCOUNTER — Ambulatory Visit (HOSPITAL_BASED_OUTPATIENT_CLINIC_OR_DEPARTMENT_OTHER): Payer: Medicare Other | Admitting: Cardiovascular Disease

## 2021-11-25 NOTE — Progress Notes (Incomplete)
Cardiology Office Note   Date:  11/25/2021   ID:  Yolanda, Saunders 01-Nov-1945, MRN [id number]  PCP:  Shirline Frees, MD  Cardiologist:   Johnny Bridge   No chief complaint on file.   History of Present Illness: Yolanda Saunders is a 76 y.o. female with chronic systolic diastolic heart failure, PAF, medically managed obstructive coronary disease, CKD 3, stroke, diabetes, hypertension, and hyperlipidemia here for follow-up.  She was admitted 07/2020 with stroke.  She had a large right PCA infarct.  She was found to have 75% stenosis of the right PCA with severe distal left P2 stenosis.  MRI of the brain showed moderate acute right PCA infarct and underlying chronic microvascular ischemic disease.  She had some remote lacunar infarcts in the basal ganglia and thalamus.  Echo that admission revealed LVEF 35 to 40%.  She also had COVID-19 and was treated with remdesivir.  She underwent left heart cath which revealed 100% occlusion of the mid LAD and D2.  She had left to left and right to left collaterals.  She also had 50% stenosis in the mid LAD and 70% distal OM 3 stenoses.  She had 60% stenosis in the RCA.  There is concern for apical thrombus as the source of her embolic stroke.  She was discharged with a 30-day monitor.  Outpatient cardiac MRI was recommended to rule out LV thrombus.  Her 30-day monitor did reveal atrial fibrillation.  She was started on Eliquis.  Cardiac MRI 10/2020 revealed LVEF 38% with LGE consistent with prior infarction.  There was no LV thrombus.  There was hypokinesis of the mid anterior, anteroseptal, inferoseptal, and inferior myocardium.  There was apical akinesis.  She followed up with Laurann Montana, NP on 10/2020.  Her blood pressure was above goal but she was not interested in increasing her medications or trying anything new.  At her last appointment her BP at home was running 130s on average.  She had an episode of chest heaviness on 01/2021.  She went to  the emergency department and was found to be sinus rhythm with PVCs.  Cardiac enzymes were negative.  She was admitted to the hospital 07/2021 with acute on chronic systolic and diastolic heart failure as well as hypoxic respiratory failure.  Echo revealed LVEF 20 to 25% and global hypokinesis.  She had grade 2 diastolic dysfunction and mildly reduced right ventricular function.  This was down from 35 to 40% previously.  She initially required BiPAP and subsequently intubation.  She was diuresed with IV Lasix.  She continued to have apneic spells requiring 48 hours of intubation.  She was started on Entresto and spironolactone.  Her carvedilol was held due to low heart rates.  She notes that she is still very tired.    Today,  She denies any palpitations, chest pain, shortness of breath, or peripheral edema. No lightheadedness, headaches, syncope, orthopnea, or PND.  (+)  Prior antihypertensives:  Lisinopril Spironolactone Losartan Amlodipine Metoprolol  Past Medical History:  Diagnosis Date   Anemia 04/17/11   "long, long years ago"   Angina 04/16/11   "that's what I'm here for"   Anxiety    Arthritis    Hips and knees   Asthma    CAD in native artery 08/07/2021   Carpal tunnel syndrome    Cataracts, bilateral    Chronic combined systolic and diastolic heart failure ([place]) 08/07/2020   CKD (chronic kidney disease), stage III (HCC)    Complication  of anesthesia 1987   "affected my eyes; couldn't see anything but blurrs even the next day"   CVA (cerebral infarction)    After cardiac catheter 02/2000   Depression    Edema    Fibromyalgia    GERD (gastroesophageal reflux disease)    Headache(784.0)    High cholesterol    History of bronchitis    Hypertension    IBS (irritable bowel syndrome)    Migraines    PAF (paroxysmal atrial fibrillation) ([place]) 08/07/2021   Panic attacks 04/17/11   "don't take anything for it"   Pneumonia 04/17/11   "probably as many as 7 times"   Renal  disorder 04/17/11   "they are working at 60% capacity"   Shortness of breath on exertion    "cause of my asthma"   Stroke ([place]) 2002   residual "problem w/using the right word, left 5 lesions on my brain/MRI; long term memory loss"   Type II diabetes mellitus ([place])     Past Surgical History:  Procedure Laterality Date   CARDIAC CATHETERIZATION  2002   CARPAL TUNNEL RELEASE  2003-2010   "twice on left; once on right"   [place] Bilateral 11/2019   Lenses Implant   histerectomy     LUMBAR LAMINECTOMY/ DECOMPRESSION WITH MET-RX Right 06/01/2020   Procedure: Right Lumbar Four-Five Minimally invasive discectomy;  Surgeon: Judith Part, MD;  Location: [facility];  Service: Neurosurgery;  Laterality: Right;   RIGHT/LEFT HEART CATH AND CORONARY ANGIOGRAPHY N/A 08/07/2020   Procedure: RIGHT/LEFT HEART CATH AND CORONARY ANGIOGRAPHY;  Surgeon: Belva Crome, MD;  Location: [facility];  Service: Cardiovascular;  Laterality: N/A;   VAGINAL HYSTERECTOMY  1977     Current Outpatient Medications  Medication Sig Dispense Refill   apixaban (ELIQUIS) 5 MG TABS tablet Take 1 tablet by mouth twice daily 60 tablet 5   atorvastatin (LIPITOR) 80 MG tablet Take 1 tablet (80 mg total) by mouth daily. 30 tablet 0   Cholecalciferol (VITAMIN D-3 PO) Take 1 capsule by mouth daily.     dapagliflozin propanediol (FARXIGA) 10 MG TABS tablet Take 1 tablet (10 mg total) by mouth daily. 90 tablet 3   gabapentin (NEURONTIN) 100 MG capsule Take 100 mg by mouth at bedtime.     glucose blood (ONETOUCH VERIO) test strip USE AS DIRECTED TO  CHECK  BLOOD  SUGAR  FOUR  TIMES  DAILY 100 each 0   insulin glargine (LANTUS) 100 UNIT/ML injection Inject 0.08-0.1 mLs (8-10 Units total) into the skin daily. 10 mL 11   insulin regular (NOVOLIN R) 100 units/mL injection Inject 10-14 Units into the skin 3 (three) times daily before meals. Per sliding scale. OTC  Novolin R.     pantoprazole (PROTONIX) 40 MG tablet Take 1 tablet (40 mg total) by mouth daily. 30 tablet 0   sacubitril-valsartan (ENTRESTO) 49-51 MG Take 1 tablet by mouth 2 (two) times daily. 180 tablet 3   sertraline (ZOLOFT) 50 MG tablet Take 50 mg by mouth daily.     spironolactone (ALDACTONE) 25 MG tablet Take 1/2 tablet (12.5 mg total) by mouth daily. 30 tablet 1   torsemide (DEMADEX) 20 MG tablet Take 1 tablet (20 mg total) by mouth once a week. Every Mon, Wed, and Fri, take extra dose for weight 160 lbs or greater 21 tablet 3   vitamin B-12 (CYANOCOBALAMIN) 1000 MCG tablet Take 1,000 mcg by  mouth daily.     No current facility-administered medications for this visit.    Allergies:   Codeine, Erythromycin, Penicillins, Shellfish allergy, Sulfonamide derivatives, Zestril [lisinopril], Toprol xl [metoprolol], Actos [pioglitazone], Aldactone [spironolactone], Amaryl [glimepiride], Apresoline [hydralazine], Benicar [olmesartan], Byetta 10 mcg pen [exenatide], Catapres [clonidine hcl], Flexeril [cyclobenzaprine], Glucotrol [glipizide], Glyburide-metformin, Humalog [insulin lispro], Invokana [canagliflozin], Januvia [sitagliptin], Neurontin [gabapentin], Norvasc [amlodipine], Prednisone, Propoxyphene, Reglan [metoclopramide], Septra [sulfamethoxazole-trimethoprim], Ultram [tramadol], Victoza [liraglutide], Zocor [simvastatin], Cozaar [losartan potassium], Novolin 70-30 [insulin nph isophane & regular], and Sulfa antibiotics    Social History:  The patient  reports that she quit smoking about 41 years ago. Her smoking use included cigarettes. She has a 4.50 pack-year smoking history. She has never used smokeless tobacco. She reports that she does not drink alcohol and does not use drugs.   Family History:  The patient's family history includes Aortic aneurysm in her son; CVA in her mother; Coronary artery disease in her father; Diabetes Mellitus I in her brother, brother, father, maternal  grandfather, maternal grandmother, mother, paternal grandfather, paternal grandmother, and sister; Stroke in her mother.    ROS:   Please see the history of present illness.   All other systems are reviewed and negative.    PHYSICAL EXAM: VS:  There were no vitals taken for this visit. , BMI There is no height or weight on file to calculate BMI. GENERAL:  Well appearing HEENT:  Pupils equal round and reactive, fundi not visualized, oral mucosa unremarkable NECK:  No jugular venous distention, waveform within normal limits, carotid upstroke brisk and symmetric, no bruits, no thyromegaly LUNGS:  Clear to auscultation bilaterally HEART:  ***RRR.  PMI not displaced or sustained,S1 and S2 within normal limits, no S3, no S4, no clicks, no rubs, no murmurs ABD:  Flat, positive bowel sounds normal in frequency in pitch, no bruits, no rebound, no guarding, no midline pulsatile mass, no hepatomegaly, no splenomegaly EXT:  2 plus pulses throughout, no edema, no cyanosis no clubbing SKIN:  No rashes no nodules NEURO:  Cranial nerves II through XII grossly intact, motor grossly intact throughout PSYCH:  Cognitively intact, oriented to person place and time   EKG:  EKG is personally reviewed. 11/25/21: EKG was not ordered.   Echo 11/20/2021: 1. Left ventricular ejection fraction, by estimation, is 70 to 75%. The  left ventricle has hyperdynamic function. The left ventricle has no  regional wall motion abnormalities. There is mild left ventricular  hypertrophy of the basal-septal segment. Left  ventricular diastolic parameters are consistent with Grade I diastolic  dysfunction (impaired relaxation). Elevated left ventricular end-diastolic  pressure.   2. Right ventricular systolic function is normal. The right ventricular  size is normal. There is normal pulmonary artery systolic pressure.   3. Left atrial size was mild to moderately dilated.   4. The mitral valve is normal in structure. Trivial  mitral valve  regurgitation. No evidence of mitral stenosis.   5. The aortic valve is normal in structure. Aortic valve regurgitation is  mild. No aortic stenosis is present.   6. Aortic dilatation noted. There is mild dilatation of the ascending  aorta, measuring 38 mm.   7. The inferior vena cava is normal in size with greater than 50%  respiratory variability, suggesting right atrial pressure of 3 mmHg.    Bilateral LE Venous Doppler 07/25/2021: Summary:  RIGHT:  - There is no evidence of deep vein thrombosis in the lower extremity.  However, portions of this examination were limited- see  technologist  comments above.    - No cystic structure found in the popliteal fossa.    LEFT:  - There is no evidence of deep vein thrombosis in the lower extremity.  However, portions of this examination were limited- see technologist  comments above.    - No cystic structure found in the popliteal fossa.    Echo 07/25/21: 1. There is mild basilar sparing of left ventricular contractile  performance. No thrombus (Definity contrast). Left ventricular ejection  fraction, by estimation, is 20 to 25%. The left ventricle has severely  decreased function. The left ventricle  demonstrates global hypokinesis. The left ventricular internal cavity size  was mildly dilated. Left ventricular diastolic parameters are consistent  with Grade II diastolic dysfunction (pseudonormalization).   2. Right ventricular systolic function is mildly reduced. The right  ventricular size is normal. There is normal pulmonary artery systolic  pressure.   3. Left atrial size was severely dilated.   4. A small pericardial effusion is present. The pericardial effusion is  anterior to the right ventricle. There is no evidence of cardiac  tamponade.   5. The mitral valve is normal in structure. Mild mitral valve  regurgitation. No evidence of mitral stenosis.   6. Tricuspid valve regurgitation is moderate.   7. The aortic  valve is normal in structure. Aortic valve regurgitation is  mild. No aortic stenosis is present.   8. The inferior vena cava is normal in size with greater than 50%  respiratory variability, suggesting right atrial pressure of 3 mmHg.   Echo 08/03/20: 1. No left ventricular thrombus is seen with Definity contrast. Left  ventricular ejection fraction, by estimation, is 35 to 40%. The left  ventricle has moderately decreased function. The left ventricle has no  regional wall motion abnormalities. Left  ventricular diastolic parameters are consistent with Grade I diastolic  dysfunction (impaired relaxation). There is severe hypokinesis of the left  ventricular, mid-apical anteroseptal wall and anterior wall. There is mild  dyskinesis of the left  ventricular, entire apical segment.   2. Right ventricular systolic function is normal. The right ventricular  size is normal. Tricuspid regurgitation signal is inadequate for assessing  PA pressure.   3. The mitral valve is normal in structure. Trivial mitral valve  regurgitation. No evidence of mitral stenosis.   4. The aortic valve is normal in structure. Aortic valve regurgitation is  mild. No aortic stenosis is present.   5. The inferior vena cava is normal in size with greater than 50%  respiratory variability, suggesting right atrial pressure of 3 mmHg.   LHC 08/07/20:  Mid LAD lesion is 100% stenosed.   2nd Diag lesion is 100% stenosed.   Total occlusion of the proximal to mid LAD with left to left and right to left collaterals.  LAD does not wraparound apex.  The first diagonal is large and also fills by collaterals. Irregularities with up to 50% in mid circumflex.  The very distal third obtuse marginal contains segmental 70% stenosis. Left main is widely patent RCA has diffuse 60% mid to distal narrowing after the first acute marginal ostium. Right heart pressures are normal LVEDP is normal   RECOMMENDATIONS: Consider MRI to rule  out apical thrombus versus empirical long-term anticoagulation therapy for presumed LV thrombus with embolic CVA.  Cardiac MRI 11/08/20: 1. Mildly increased left ventricular size, with LVEDD 52 mm, but LVEDVi 95 mL/m2.   Normal left ventricular thickness, with intraventricular septal thickness of 8 mm, posterior wall  thickness of 6 mm, and myocardial mass index of 51 g/m2.   Moderately reduced left ventricular systolic function (LVEF = 38%). There are regional wall motion abnormalities: There is hypokinesis of the mid anterior, anteroseptal, inferoseptal, and inferior segments. There is akinesis of the apex and all apical segments.   Left ventricular parametric mapping notable for normal T2 signal mild increase in the mid LV segments (30-40%) and significant increase in the apex (46-54%) .   There is late gadolinium enhancement in the left ventricular myocardium: There is apical inferior 75 % LGE suggestive of infarction. There is apical anterior septum and anterior remodeling and thinning.   No LV thrombus noted.   2. Normal right ventricular size with RVEDVI 65 mL/m2.   Normal right ventricular thickness.   Normal right ventricular systolic function (RVEF =40%). There are no regional wall motion abnormalities or aneurysms.   3.  Normal left and right atrial size.   4. Normal size of the aortic root, ascending aorta and pulmonary artery.   5.  Qualitatively no significant valvular abnormalities.   6.  Normal pericardium.  No pericardial effusion.   7. Grossly, no extracardiac findings. Recommended dedicated study if concerned for non-cardiac pathology.   IMPRESSION: Ischemic Cardiomyopathy and evidence of LAD infarct. No LV thrombus noted.   Recent Labs: 07/26/2021: Magnesium 2.0 07/27/2021: ALT 35 07/29/2021: Hemoglobin 9.8; Platelets 126 08/14/2021: B Natriuretic Peptide 173.8; BUN 26; Creatinine, Ser 1.73; Potassium 3.7; Sodium 140    Lipid Panel    Component  Value Date/Time   CHOL 114 09/25/2020 0951   TRIG 39 07/26/2021 0202   HDL 33 (L) 09/25/2020 0951   CHOLHDL 3.5 09/25/2020 0951   CHOLHDL 9.3 08/03/2020 0119   VLDL 27 08/03/2020 0119   LDLCALC 59 09/25/2020 0951   LDLDIRECT 144.0 09/24/2018 1123      Wt Readings from Last 3 Encounters:  11/14/21 166 lb 12.8 oz (75.7 kg)  10/24/21 168 lb 6.4 oz (76.4 kg)  10/03/21 160 lb (72.6 kg)      ASSESSMENT AND PLAN:  No problem-specific Assessment & Plan notes found for this encounter.   Current medicines are reviewed at length with the patient today.  The patient does not have concerns regarding medicines.  The following changes have been made:  no change  Labs/ tests ordered today include:  No orders of the defined types were placed in this encounter.   Disposition:   FU with Tiffany C. Oval Linsey, MD, [facility] in 6 months.    I,Breanna Adamick,acting as a scribe for Skeet Latch, MD.,have documented all relevant documentation on the behalf of Skeet Latch, MD,as directed by  Skeet Latch, MD while in the presence of Skeet Latch, MD.  ***  Signed, Tiffany C. Oval Linsey, MD, [facility]  11/25/2021 9:25 AM    [facility]

## 2021-11-26 ENCOUNTER — Telehealth: Payer: Self-pay | Admitting: Cardiovascular Disease

## 2021-11-26 DIAGNOSIS — Z5181 Encounter for therapeutic drug level monitoring: Secondary | ICD-10-CM

## 2021-11-26 DIAGNOSIS — I10 Essential (primary) hypertension: Secondary | ICD-10-CM

## 2021-11-26 DIAGNOSIS — I5023 Acute on chronic systolic (congestive) heart failure: Secondary | ICD-10-CM

## 2021-11-26 NOTE — Telephone Encounter (Signed)
November 26, 2021 Loel Dubonnet, NP  to Me     11/26/21  4:57 PM She cancelled her Nov OV and rescheduled to January? Likely need to call her to see how weights, etc are doing. BMP/BNP/CBC prior to OV would help but can also do day of visit.  Loel Dubonnet, NP  Weights [phone], denies any symptoms  Has decreased Torsemide to Monday, Wednesday and Friday only  Will use as needed for wt gain 2 lbs in 24 hours or 5 lbs in 7 days.   Advised to call if needing Torsemide often or any changes, verbalized understand  Adonis Brook apologized for having to reschedule but she is currently in the hospital herself

## 2021-11-26 NOTE — Telephone Encounter (Signed)
Patient's niece Adonis Brook wants to know if patient needs to have labs done prior to her visit.  Please call niece and let her know.

## 2021-11-27 ENCOUNTER — Telehealth (HOSPITAL_COMMUNITY): Payer: Self-pay

## 2021-11-27 NOTE — Telephone Encounter (Signed)
Called and spoke with [place] with pt in the car. Pt stated she is not interested in CR at this time, she has other health issues going on and does not have the energy for CR.   Closed referral

## 2021-11-28 DIAGNOSIS — N3000 Acute cystitis without hematuria: Secondary | ICD-10-CM | POA: Diagnosis not present

## 2021-11-28 DIAGNOSIS — M7989 Other specified soft tissue disorders: Secondary | ICD-10-CM | POA: Diagnosis not present

## 2021-11-28 DIAGNOSIS — R300 Dysuria: Secondary | ICD-10-CM | POA: Diagnosis not present

## 2021-12-31 DIAGNOSIS — M79675 Pain in left toe(s): Secondary | ICD-10-CM | POA: Diagnosis not present

## 2021-12-31 DIAGNOSIS — R413 Other amnesia: Secondary | ICD-10-CM | POA: Diagnosis not present

## 2021-12-31 DIAGNOSIS — R300 Dysuria: Secondary | ICD-10-CM | POA: Diagnosis not present

## 2022-01-23 ENCOUNTER — Other Ambulatory Visit: Payer: Self-pay | Admitting: *Deleted

## 2022-01-23 ENCOUNTER — Ambulatory Visit: Payer: Medicare Other | Admitting: Podiatry

## 2022-01-23 ENCOUNTER — Telehealth: Payer: Self-pay | Admitting: *Deleted

## 2022-01-23 DIAGNOSIS — G4733 Obstructive sleep apnea (adult) (pediatric): Secondary | ICD-10-CM

## 2022-01-23 NOTE — Telephone Encounter (Signed)
ATC patient x1.  LVM to return call.  When patient returns call, we need to cancel her appointment for 01/24/22 since she does not have a CPAP machine yet and have her call us when she receives one to schedule her 31-90 day f/u.  Will await return call.

## 2022-01-24 ENCOUNTER — Ambulatory Visit: Payer: Medicare Other | Admitting: Adult Health

## 2022-01-24 NOTE — Telephone Encounter (Signed)
Called and spoke with patient's POA (DPR), advised of need to reschedule visit for when patient receives her CPAP machine.  She verbalized understanding and will call back to schedule visit after they receive CPAP machine.  Nothing further needed.

## 2022-01-30 ENCOUNTER — Ambulatory Visit: Payer: Medicare Other | Admitting: Podiatry

## 2022-01-30 DIAGNOSIS — B351 Tinea unguium: Secondary | ICD-10-CM | POA: Diagnosis not present

## 2022-01-30 DIAGNOSIS — R0989 Other specified symptoms and signs involving the circulatory and respiratory systems: Secondary | ICD-10-CM | POA: Diagnosis not present

## 2022-01-30 DIAGNOSIS — M79675 Pain in left toe(s): Secondary | ICD-10-CM | POA: Diagnosis not present

## 2022-01-30 DIAGNOSIS — M79674 Pain in right toe(s): Secondary | ICD-10-CM | POA: Diagnosis not present

## 2022-01-30 DIAGNOSIS — L539 Erythematous condition, unspecified: Secondary | ICD-10-CM

## 2022-01-30 MED ORDER — MUPIROCIN 2 % EX OINT: 1.0000 | TOPICAL_OINTMENT | Freq: Two times a day (BID) | CUTANEOUS | 2 refills | Status: DC

## 2022-01-30 MED ORDER — DOXYCYCLINE HYCLATE 100 MG PO TABS: 100.0000 mg | ORAL_TABLET | Freq: Two times a day (BID) | ORAL | 0 refills | Status: DC

## 2022-01-30 NOTE — Progress Notes (Addendum)
Subjective:   Patient ID: [name], female   DOB: 77 y.o.   MRN: [id number]   HPI Chief Complaint  Patient presents with   Foot Problem    DFC-nail trim; Pain in left toe(s)   77 year old female presents the office for above concerns.  Her nails are thickened elongated she has difficulty trimming them herself.  She has had redness to the left second toe.  She previous on antibiotics which helped but not completely.  She does get tenderness to the toe.  No drainage or pus that she reports.  She denies any fevers or chills.    No other concerns.   Review of Systems  All other systems reviewed and are negative.  Past Medical History:  Diagnosis Date   Anemia 04/17/11   "long, long years ago"   Angina 04/16/11   "that's what I'm here for"   Anxiety    Arthritis    Hips and knees   Asthma    CAD in native artery 08/07/2021   Carpal tunnel syndrome    Cataracts, bilateral    Chronic combined systolic and diastolic heart failure ([place]) 08/07/2020   CKD (chronic kidney disease), stage III ([place])    Complication of anesthesia 1987   "affected my eyes; couldn't see anything but blurrs even the next day"   CVA (cerebral infarction)    After cardiac catheter 02/2000   Depression    Edema    Fibromyalgia    GERD (gastroesophageal reflux disease)    Headache(784.0)    High cholesterol    History of bronchitis    Hypertension    IBS (irritable bowel syndrome)    Migraines    PAF (paroxysmal atrial fibrillation) ([place]) 08/07/2021   Panic attacks 04/17/11   "don't take anything for it"   Pneumonia 04/17/11   "probably as many as 7 times"   Renal disorder 04/17/11   "they are working at 60% capacity"   Shortness of breath on exertion    "cause of my asthma"   Stroke ([place]) 2002   residual "problem w/using the right word, left 5 lesions on my brain/MRI; long term memory loss"   Type II diabetes mellitus ([place])     Past Surgical History:  Procedure Laterality Date   CARDIAC  CATHETERIZATION  2002   CARPAL TUNNEL RELEASE  2003-2010   "twice on left; once on right"   [place]   EYE SURGERY Bilateral 11/2019   Lenses Implant   histerectomy     LUMBAR LAMINECTOMY/ DECOMPRESSION WITH MET-RX Right 06/01/2020   Procedure: Right Lumbar Four-Five Minimally invasive discectomy;  Surgeon: Judith Part, MD;  Location: [facility];  Service: Neurosurgery;  Laterality: Right;   RIGHT/LEFT HEART CATH AND CORONARY ANGIOGRAPHY N/A 08/07/2020   Procedure: RIGHT/LEFT HEART CATH AND CORONARY ANGIOGRAPHY;  Surgeon: Belva Crome, MD;  Location: [facility];  Service: Cardiovascular;  Laterality: N/A;   VAGINAL HYSTERECTOMY  1977     Current Outpatient Medications:    apixaban (ELIQUIS) 5 MG TABS tablet, Take 1 tablet by mouth twice daily, Disp: 60 tablet, Rfl: 5   atorvastatin (LIPITOR) 80 MG tablet, Take 1 tablet (80 mg total) by mouth daily., Disp: 30 tablet, Rfl: 0   Cholecalciferol (VITAMIN D-3 PO), Take 1 capsule by mouth daily., Disp: , Rfl:    dapagliflozin propanediol (FARXIGA) 10 MG TABS tablet, Take 1 tablet (10 mg total) by mouth daily., Disp: 90  tablet, Rfl: 3   doxycycline (VIBRA-TABS) 100 MG tablet, Take 1 tablet (100 mg total) by mouth 2 (two) times daily., Disp: 20 tablet, Rfl: 0   gabapentin (NEURONTIN) 100 MG capsule, Take 100 mg by mouth at bedtime., Disp: , Rfl:    glucose blood (ONETOUCH VERIO) test strip, USE AS DIRECTED TO  CHECK  BLOOD  SUGAR  FOUR  TIMES  DAILY, Disp: 100 each, Rfl: 0   insulin glargine (LANTUS) 100 UNIT/ML injection, Inject 0.08-0.1 mLs (8-10 Units total) into the skin daily., Disp: 10 mL, Rfl: 11   insulin regular (NOVOLIN R) 100 units/mL injection, Inject 10-14 Units into the skin 3 (three) times daily before meals. Per sliding scale. OTC Novolin R., Disp: , Rfl:    mupirocin ointment (BACTROBAN) 2 %, Apply 1 Application topically 2 (two) times daily., Disp: 30 g, Rfl: 2    pantoprazole (PROTONIX) 40 MG tablet, Take 1 tablet (40 mg total) by mouth daily., Disp: 30 tablet, Rfl: 0   sacubitril-valsartan (ENTRESTO) 49-51 MG, Take 1 tablet by mouth 2 (two) times daily., Disp: 180 tablet, Rfl: 3   sertraline (ZOLOFT) 50 MG tablet, Take 50 mg by mouth daily., Disp: , Rfl:    spironolactone (ALDACTONE) 25 MG tablet, Take 1/2 tablet (12.5 mg total) by mouth daily., Disp: 30 tablet, Rfl: 1   torsemide (DEMADEX) 20 MG tablet, Take 1 tablet (20 mg total) by mouth once a week. Every Mon, Wed, and Fri, take extra dose for weight 160 lbs or greater, Disp: 21 tablet, Rfl: 3   vitamin B-12 (CYANOCOBALAMIN) 1000 MCG tablet, Take 1,000 mcg by mouth daily., Disp: , Rfl:   Allergies  Allergen Reactions   Codeine Other (See Comments)    "makes me crazy; see things; delusional"   Erythromycin Rash and Other (See Comments)    Peeling skin Skin turned "purplish"    Penicillins Rash and Other (See Comments)    Skin blisters   Shellfish Allergy Anaphylaxis and Swelling    Throat swells   Sulfonamide Derivatives Hives   Zestril [Lisinopril] Cough   Toprol Xl [Metoprolol] Diarrhea and Nausea And Vomiting    Rapid weight gain Hallucinations    Actos [Pioglitazone] Other (See Comments)    Upset GI   Aldactone [Spironolactone] Other (See Comments)    Unknown reaction   Amaryl [Glimepiride] Other (See Comments)    Upset GI   Apresoline [Hydralazine] Other (See Comments)    Unknown reaction   Benicar [Olmesartan] Other (See Comments)    Dizziness Headaches   Byetta 10 Mcg Pen [Exenatide] Nausea And Vomiting   Catapres [Clonidine Hcl] Other (See Comments)    Unknown reaction   Flexeril [Cyclobenzaprine] Other (See Comments)    Drowsiness    Glucotrol [Glipizide] Other (See Comments)    Upset GI   Glyburide-Metformin Other (See Comments)    Myalgias   Humalog [Insulin Lispro] Other (See Comments)    Headache Severe hyperglycemia   Invokana [Canagliflozin] Other (See  Comments)    Yeast infections   Januvia [Sitagliptin] Other (See Comments)    UTI   Neurontin [Gabapentin] Other (See Comments)    Anxiety    Norvasc [Amlodipine] Other (See Comments)    Syncope   Prednisone Other (See Comments)    Unknown reaction   Propoxyphene Other (See Comments)    Unknown reaction Darvocet-N   Reglan [Metoclopramide] Other (See Comments)    Unknown reaction   Septra [Sulfamethoxazole-Trimethoprim] Hives   Trimethoprim    Ultram [Tramadol] Other (  See Comments)    Anxiety    Victoza [Liraglutide] Diarrhea    Upset GI   Zocor [Simvastatin] Other (See Comments)    Myalgias    Cozaar [Losartan Potassium] Rash    Upset GI   Novolin 70-30 [Insulin Nph Isophane & Regular] Swelling and Rash   Sulfa Antibiotics Hives and Rash          Objective:  Physical Exam  General: AAO x3, NAD  Dermatological: Nails are hypertrophic, dystrophic, brittle, discolored, elongated 10. No surrounding redness or drainage. Tenderness nails 1-5 bilaterally.  In particular left second toe there is localized edema and erythema to the distal portion of toe.  Is not incurvation of nail with there is no drainage or pus.  There is no ascending cellulitis.  There is no fluctuation or crepitation.  There is no malodor.  There are no open lesions.  Vascular: Dorsalis Pedis artery and Posterior Tibial artery pedal pulses are 1/4 bilateral with immedate capillary fill time. There is no pain with calf compression, swelling, warmth, erythema.   Neruologic: Grossly intact via light touch bilateral.   Musculoskeletal: Muscular strength 5/5 in all groups tested bilateral.      Assessment:   77 year old female with symptomatic onychosis, left second toe erythema, decreased pulses    Plan:  -Treatment options discussed including all alternatives, risks, and complications -Etiology of symptoms were discussed -Sharply debrided nails x 10 without any complications or bleeding.   Particularly on the second toe on the left foot as able debride some incurvation of the nail border.  After I debrided this there is no significant ingrown toenail otherwise there is no drainage or pus or signs of infection. -Given continued erythema will restart antibiotics.  Prescribed doxycycline.  Mupirocin ointment around the nail itself.  Monitor for signs or symptoms of worsening erythema or signs of infection. -ABI ordered given decreased pedal pulses  X-ray left foot if symptoms continue.   Trula Slade DPM

## 2022-02-13 ENCOUNTER — Ambulatory Visit (HOSPITAL_BASED_OUTPATIENT_CLINIC_OR_DEPARTMENT_OTHER): Payer: Medicare Other | Admitting: Cardiovascular Disease

## 2022-02-17 ENCOUNTER — Other Ambulatory Visit (HOSPITAL_COMMUNITY): Payer: Self-pay | Admitting: Adult Health

## 2022-02-17 ENCOUNTER — Ambulatory Visit: Payer: Medicare Other | Admitting: Podiatry

## 2022-02-25 ENCOUNTER — Ambulatory Visit: Payer: Medicare Other | Admitting: Podiatry

## 2022-02-26 DIAGNOSIS — R509 Fever, unspecified: Secondary | ICD-10-CM | POA: Diagnosis not present

## 2022-02-26 DIAGNOSIS — R5383 Other fatigue: Secondary | ICD-10-CM | POA: Diagnosis not present

## 2022-02-26 DIAGNOSIS — N390 Urinary tract infection, site not specified: Secondary | ICD-10-CM | POA: Diagnosis not present

## 2022-02-26 DIAGNOSIS — R051 Acute cough: Secondary | ICD-10-CM | POA: Diagnosis not present

## 2022-02-26 DIAGNOSIS — U071 COVID-19: Secondary | ICD-10-CM | POA: Diagnosis not present

## 2022-03-17 ENCOUNTER — Ambulatory Visit: Payer: Medicare Other | Admitting: Podiatry

## 2022-03-17 ENCOUNTER — Ambulatory Visit: Payer: Medicare Other | Admitting: Internal Medicine

## 2022-03-17 DIAGNOSIS — N12 Tubulo-interstitial nephritis, not specified as acute or chronic: Secondary | ICD-10-CM | POA: Diagnosis not present

## 2022-03-17 DIAGNOSIS — N390 Urinary tract infection, site not specified: Secondary | ICD-10-CM | POA: Diagnosis not present

## 2022-03-24 DIAGNOSIS — N816 Rectocele: Secondary | ICD-10-CM | POA: Diagnosis not present

## 2022-03-24 DIAGNOSIS — N3021 Other chronic cystitis with hematuria: Secondary | ICD-10-CM | POA: Diagnosis not present

## 2022-03-31 ENCOUNTER — Other Ambulatory Visit: Payer: Self-pay

## 2022-03-31 ENCOUNTER — Other Ambulatory Visit: Payer: Self-pay | Admitting: Internal Medicine

## 2022-03-31 DIAGNOSIS — Z794 Long term (current) use of insulin: Secondary | ICD-10-CM

## 2022-03-31 MED ORDER — ONETOUCH VERIO VI STRP: ORAL_STRIP | 0 refills | Status: DC

## 2022-04-04 DIAGNOSIS — K529 Noninfective gastroenteritis and colitis, unspecified: Secondary | ICD-10-CM | POA: Diagnosis not present

## 2022-04-04 DIAGNOSIS — R309 Painful micturition, unspecified: Secondary | ICD-10-CM | POA: Diagnosis not present

## 2022-04-07 DIAGNOSIS — G4733 Obstructive sleep apnea (adult) (pediatric): Secondary | ICD-10-CM | POA: Diagnosis not present

## 2022-04-21 ENCOUNTER — Other Ambulatory Visit: Payer: Self-pay | Admitting: Physician Assistant

## 2022-04-21 ENCOUNTER — Ambulatory Visit: Payer: Medicare Other | Admitting: Internal Medicine

## 2022-04-21 ENCOUNTER — Ambulatory Visit
Admission: RE | Admit: 2022-04-21 | Discharge: 2022-04-21 | Disposition: A | Discharge status: Home / Self Care | Payer: Medicare Other | Source: Ambulatory Visit | Attending: Physician Assistant | Admitting: Physician Assistant

## 2022-04-21 DIAGNOSIS — R42 Dizziness and giddiness: Secondary | ICD-10-CM | POA: Diagnosis not present

## 2022-04-21 DIAGNOSIS — W19XXXA Unspecified fall, initial encounter: Secondary | ICD-10-CM

## 2022-04-21 DIAGNOSIS — M79605 Pain in left leg: Secondary | ICD-10-CM | POA: Diagnosis not present

## 2022-04-21 DIAGNOSIS — I10 Essential (primary) hypertension: Secondary | ICD-10-CM | POA: Diagnosis not present

## 2022-04-21 NOTE — Progress Notes (Deleted)
Patient ID: Yolanda Saunders, female   DOB: Oct 14, 1945, 77 y.o.   MRN: [id number]  HPI: Yolanda Saunders is a 77 y.o.-year-old female, returning for follow-up for DM2, dx 1999, insulin-dependent since ~2012, uncontrolled, with complications (CHF, CKD stage 3, h/o stroke post cardiac cath in 2002 and 06/2020, macroalbuminuria). She saw Dr Buddy Duty in the past.  She is here with her niece, who is her caregiver.  Last visit 5 months ago.  Interim history: No increased urination, nausea, chest pain.  Last hemoglobin A1c was: Lab Results  Component Value Date   HGBA1C 7.5 (A) 11/14/2021   HGBA1C 7.8 (H) 07/25/2021   HGBA1C 7.2 (H) 08/03/2020  09/21/2019: HbA1c 11.5% 07/31/2014: 13.4%   In the past, she had a period in which she could not afford analog insulin products  >> we changed to:  Insulin Before breakfast Before lunch Before dinner  Regular 25 - smaller meal 30 - larger meal 25 - smaller meal 30 - larger meal 25 - smaller meal 30 - larger meal  NPH 40  30    Currently on: - Lantus 40 units twice a day >> (vials)  off >> restarted 03/2020: 30 >> 20 units in am >> off >> restarted  8(-10) units daily in am - Regular (ReliOn): 34 >> 15-20 >> 8-12 >> 6-10 units before meals - Farxiga 10 mg before b'fast - added this summer Try to take 5-10 units if you have a snack after dinner.  We stopped Invokana 100 mg before b'fast >> lethargy, fatigue -  in 11/2015 I suggested Bydureon 2 mg weekly - in donut hole >> could not start Tried Victoza >> helped, but AP She stopped Metformin ER 500 mg 2x a day because of AP, but does have IBS. She feels better after she stopped Tried Byetta >> N/V Tried Metformin >> AP, diarrhea, gas Tried SU >> upset stomach, nausea Has been on Invokana before >> yeast inf She had elevated lipase (72) in 09/2011 while on Victoza.  She also reportedly had pancreatitis at the beginning of 2019.  Patient checks blood sugars sugars 2-3x a day: - am: 47, 52-119, 169,  173, 290 >> 115-182 - 2h after b'fast: 58, 214-265 >> 218 >> n/c >> 62, 64 >> n/c - before lunch:  353/130s >> 63, 68-132, 162 >> n/c  - 2h after lunch:   73-199, 225, 266 >> 30 min after: 157-265 - before dinner: 257, 285 >> 395/180s >> 86-199 >> n/c - 2h after dinner: 223 >> 425/180-185 >> 73-160 >> 93-173 - bedtime:  196, 311 >> 409 >> 56, 102-153, 202, 205 >> n/c - nighttime:  321, 332 >> 252-349 >> 402 >> 46, 178 >> n/c Lowest sugar was: 46 >> 40s - at the [place].  she has hypoglycemia awareness in the 60s Highest sugar was 600s on Humalog >> ... 200s.   Glucometer: Free Style  Pt's meals are: - Breakfast: oatmeal - Lunch: sandwich; chicken nuggets and fries - Dinner: heaviest: meat + 2 veggies + dessert later - Snacks: "too many" - cookies, sugary snacks  -+ CKD, last BUN/creatinine: Lab Results  Component Value Date   BUN 26 (H) 08/14/2021   CREATININE 1.73 (H) 08/14/2021  Received labs from PCP 09/21/2019: HbA1c 11.5%, glucose 373, BUN/creatinine 21/1.41, GFR 36  + MAU: Lab Results  Component Value Date   MICRALBCREAT 32.0 (H) 02/11/2016  04/2014: ACR: >300 She sees nephrology. Intolerant to ACEI/ARBs. On Entresto.  -+ HL; last set of lipids:  Lab Results  Component Value Date   CHOL 114 09/25/2020   HDL 33 (L) 09/25/2020   LDLCALC 59 09/25/2020   LDLDIRECT 144.0 09/24/2018   TRIG 39 07/26/2021   CHOLHDL 3.5 09/25/2020  09/21/2019: 236/255/37/151 07/09/2016: 175/122/36/150 On Lipitor 80 << 40.  - last eye exam was in May 03, 2020: + DR. She had cataract surgery since last visit.  -She denies numbness and tingling in her feet.  She was seeing Dr. Narda Amber >> investigation for disequllibrium and word difficulty. ? MS. She also has HTN,  anemia, GERD.  During investigation of her back pain in 02/2020, she was found to have diverticulitis.  This was treated.  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 Also has dyslexia after the stroke.   ROS: + see HPI  I reviewed pt's medications, allergies, PMH, social hx, family hx, and changes were documented in the history of present illness. Otherwise, unchanged from my initial visit note.  Past Medical History:  Diagnosis Date   Anemia 04/17/11   "long, long years ago"   Angina 04/16/11   "that's what I'm here for"   Anxiety    Arthritis    Hips and knees   Asthma    CAD in native artery 08/07/2021   Carpal tunnel syndrome    Cataracts, bilateral    Chronic combined systolic and diastolic heart failure ([place]) 08/07/2020   CKD (chronic kidney disease), stage III ([place])    Complication of anesthesia 1987   "affected my eyes; couldn't see anything but blurrs even the next day"   CVA (cerebral infarction)    After cardiac catheter 02/2000   Depression    Edema    Fibromyalgia    GERD (gastroesophageal reflux disease)    Headache(784.0)    High cholesterol    History of bronchitis    Hypertension    IBS (irritable bowel syndrome)    Migraines    PAF (paroxysmal atrial fibrillation) ([place]) 08/07/2021   Panic attacks 04/17/11   "don't take anything for it"   Pneumonia 04/17/11   "probably as many as 7 times"   Renal disorder 04/17/11   "they are working at 60% capacity"   Shortness of breath on exertion    "cause of my asthma"   Stroke ([place]) 2000/05/03   residual "problem w/using the right word, left 5 lesions on my brain/MRI; long term memory loss"   Type II diabetes mellitus ([place])    Past Surgical History:  Procedure Laterality Date   CARDIAC CATHETERIZATION  2000-05-03   CARPAL TUNNEL RELEASE  2003-2010   "twice on left; once on right"   [place]   EYE SURGERY Bilateral 11/2019   Lenses Implant   histerectomy     LUMBAR  LAMINECTOMY/ DECOMPRESSION WITH MET-RX Right 06/01/2020   Procedure: Right Lumbar Four-Five Minimally invasive discectomy;  Surgeon: Judith Part, MD;  Location: [facility];  Service: Neurosurgery;  Laterality: Right;   RIGHT/LEFT HEART CATH AND CORONARY ANGIOGRAPHY N/A 08/07/2020   Procedure: RIGHT/LEFT HEART CATH AND CORONARY ANGIOGRAPHY;  Surgeon: Belva Crome, MD;  Location: [facility];  Service: Cardiovascular;  Laterality: N/A;   [place]  Social History   Social History   Marital Status: Married   Social History Main Topics   Smoking status: Former Smoker -- 0.75 packs/day for 6 years    Types: Cigarettes    Quit date: 01/21/1980   Smokeless tobacco: Never Used   Alcohol Use: No   Drug Use: No   Social History Narrative   Lives with husband in a one story home.  Has 2 children.  Retired from [employer].  Education: 12th grade.  Trade schools.    Current Outpatient Medications on File Prior to Visit  Medication Sig Dispense Refill   apixaban (ELIQUIS) 5 MG TABS tablet Take 1 tablet by mouth twice daily 60 tablet 5   atorvastatin (LIPITOR) 80 MG tablet Take 1 tablet (80 mg total) by mouth daily. 30 tablet 0   Cholecalciferol (VITAMIN D-3 PO) Take 1 capsule by mouth daily.     dapagliflozin propanediol (FARXIGA) 10 MG TABS tablet Take 1 tablet (10 mg total) by mouth daily. 90 tablet 3   doxycycline (VIBRA-TABS) 100 MG tablet Take 1 tablet (100 mg total) by mouth 2 (two) times daily. 20 tablet 0   gabapentin (NEURONTIN) 100 MG capsule Take 100 mg by mouth at bedtime.     glucose blood (ONETOUCH VERIO) test strip USE AS DIRECTED TO  CHECK  BLOOD  SUGAR  FOUR  TIMES  DAILY 50 each 0   insulin glargine (LANTUS) 100 UNIT/ML injection Inject 0.08-0.1 mLs (8-10 Units total) into the skin daily. 10 mL 11   insulin regular (NOVOLIN R) 100 units/mL injection Inject 10-14 Units into the skin 3 (three) times daily before meals. Per sliding scale. OTC Novolin  R.     mupirocin ointment (BACTROBAN) 2 % Apply 1 Application topically 2 (two) times daily. 30 g 2   pantoprazole (PROTONIX) 40 MG tablet Take 1 tablet (40 mg total) by mouth daily. 30 tablet 0   sacubitril-valsartan (ENTRESTO) 49-51 MG Take 1 tablet by mouth 2 (two) times daily. 180 tablet 3   sertraline (ZOLOFT) 50 MG tablet Take 50 mg by mouth daily.     spironolactone (ALDACTONE) 25 MG tablet Take 1/2 tablet (12.5 mg total) by mouth daily. 30 tablet 1   torsemide (DEMADEX) 20 MG tablet TAKE 1 TABLET BY MOUTH ON MONDAY, WEDNESDAY AND FRIDAY TAKE EXTRA DOSE FOR WEIGHT OVER 160 LBS 21 tablet 0   vitamin B-12 (CYANOCOBALAMIN) 1000 MCG tablet Take 1,000 mcg by mouth daily.     No current facility-administered medications on file prior to visit.   Allergies  Allergen Reactions   Codeine Other (See Comments)    "makes me crazy; see things; delusional"   Erythromycin Rash and Other (See Comments)    Peeling skin Skin turned "purplish"    Penicillins Rash and Other (See Comments)    Skin blisters   Shellfish Allergy Anaphylaxis and Swelling    Throat swells   Sulfonamide Derivatives Hives   Zestril [Lisinopril] Cough   Toprol Xl [Metoprolol] Diarrhea and Nausea And Vomiting    Rapid weight gain Hallucinations    Actos [Pioglitazone] Other (See Comments)    Upset GI   Aldactone [Spironolactone] Other (See Comments)    Unknown reaction   Amaryl [Glimepiride] Other (See Comments)    Upset GI   Apresoline [Hydralazine] Other (See Comments)    Unknown reaction   Benicar [Olmesartan] Other (See Comments)    Dizziness Headaches   Byetta 10 Mcg Pen [Exenatide] Nausea And Vomiting   Catapres [Clonidine  Hcl] Other (See Comments)    Unknown reaction   Flexeril [Cyclobenzaprine] Other (See Comments)    Drowsiness    Glucotrol [Glipizide] Other (See Comments)    Upset GI   Glyburide-Metformin Other (See Comments)    Myalgias   Humalog [Insulin Lispro] Other (See Comments)     Headache Severe hyperglycemia   Invokana [Canagliflozin] Other (See Comments)    Yeast infections   Januvia [Sitagliptin] Other (See Comments)    UTI   Neurontin [Gabapentin] Other (See Comments)    Anxiety    Norvasc [Amlodipine] Other (See Comments)    Syncope   Prednisone Other (See Comments)    Unknown reaction   Propoxyphene Other (See Comments)    Unknown reaction Darvocet-N   Reglan [Metoclopramide] Other (See Comments)    Unknown reaction   Septra [Sulfamethoxazole-Trimethoprim] Hives   Trimethoprim    Ultram [Tramadol] Other (See Comments)    Anxiety    Victoza [Liraglutide] Diarrhea    Upset GI   Zocor [Simvastatin] Other (See Comments)    Myalgias    Cozaar [Losartan Potassium] Rash    Upset GI   Novolin 70-30 [Insulin Nph Isophane & Regular] Swelling and Rash   Sulfa Antibiotics Hives and Rash   Family History  Problem Relation Age of Onset   Coronary artery disease Father    Diabetes Mellitus I Father    CVA Mother    Stroke Mother    Diabetes Mellitus I Mother    Diabetes Mellitus I Sister    Diabetes Mellitus I Brother    Diabetes Mellitus I Maternal Grandmother    Diabetes Mellitus I Maternal Grandfather    Diabetes Mellitus I Paternal Grandmother    Diabetes Mellitus I Paternal Grandfather    Diabetes Mellitus I Brother    Aortic aneurysm Son    PE: There were no vitals taken for this visit.  Wt Readings from Last 3 Encounters:  11/14/21 166 lb 12.8 oz (75.7 kg)  10/24/21 168 lb 6.4 oz (76.4 kg)  10/03/21 160 lb (72.6 kg)   Constitutional: Slightly overweight, in NAD ENT: no neck masses, no cervical lymphadenopathy Cardiovascular: RRR, No MRG Respiratory: CTA B Musculoskeletal: no deformities Skin:no rashes Neurological: no tremor with outstretched hands  ASSESSMENT: 1. DM2, insulin-dependent, uncontrolled, with complications - CKD stage 3 - h/o stroke post cardiac cath in 2002 - macroalbuminuria >> improved - Lipoatrophy at the site  of insulin injections  2. Obesity class 2  3. HL  PLAN:  1. Patient with longstanding, uncontrolled, type 2 diabetes, insulin-dependent, on a basal/bolus insulin regimen, noncompliant with appointments.  At last visit, she returned after 1.5 years.  In 2022 she again returned after another similar absence.  We discussed in the past that she could continue to see PCP for this problem but she wanted to continue to follow with me.  In that case, I advised him to try to come for more frequent appointments.  She and her niece agreed with the plan.  At last visit, she was on Farxiga started by cardiology due to a new diagnosis of CHF.  She was off her long-acting insulin so I advised her to restart it.  I also advised her to reduce the regular insulin while restarting Lantus (in the vial, and she does not see good to use a pen).  Will continue [place].  She was checking sugars 30 minutes after lunch and I advised her to move the checks before meals or 2 hours after.  They  were not able to obtain a CGM.  - I advised her to:  Patient Instructions  Please continue: - Farxiga 10 mg before b'fast - R insulin: 6-10 units 30 min before meals - Lantus 8(-10) units daily in am  Please return in 3-4 months with your sugar log.   - we checked her HbA1c: 7%  - advised to check sugars at different times of the day - 4x a day, rotating check times - advised for yearly eye exams >> she is UTD - return to clinic in 3-4 months  2. . Obesity class 2 -She continues on insulin, which is weight inducing, however, she is also on Farxiga which should help with weight loss -She lost 27 pounds before last visit  3. HL -Reviewed latest lipid panel from 09/2020: At goal except for a slightly low HDL: Lab Results  Component Value Date   CHOL 114 09/25/2020   HDL 33 (L) 09/25/2020   LDLCALC 59 09/25/2020   LDLDIRECT 144.0 09/24/2018   TRIG 39 07/26/2021   CHOLHDL 3.5 09/25/2020  -She continues on Lipitor 80 mg  daily without side effects  Philemon Kingdom, MD PhD [facility]

## 2022-04-22 ENCOUNTER — Other Ambulatory Visit (HOSPITAL_COMMUNITY): Payer: Self-pay

## 2022-04-26 ENCOUNTER — Other Ambulatory Visit (HOSPITAL_COMMUNITY): Payer: Self-pay | Admitting: Adult Health

## 2022-04-26 ENCOUNTER — Other Ambulatory Visit (HOSPITAL_COMMUNITY): Payer: Self-pay | Admitting: Cardiology

## 2022-05-08 DIAGNOSIS — E1165 Type 2 diabetes mellitus with hyperglycemia: Secondary | ICD-10-CM | POA: Diagnosis not present

## 2022-05-08 DIAGNOSIS — I10 Essential (primary) hypertension: Secondary | ICD-10-CM | POA: Diagnosis not present

## 2022-05-08 DIAGNOSIS — R600 Localized edema: Secondary | ICD-10-CM | POA: Diagnosis not present

## 2022-05-08 DIAGNOSIS — I130 Hypertensive heart and chronic kidney disease with heart failure and stage 1 through stage 4 chronic kidney disease, or unspecified chronic kidney disease: Secondary | ICD-10-CM | POA: Diagnosis not present

## 2022-05-08 DIAGNOSIS — G4733 Obstructive sleep apnea (adult) (pediatric): Secondary | ICD-10-CM | POA: Diagnosis not present

## 2022-05-08 DIAGNOSIS — I509 Heart failure, unspecified: Secondary | ICD-10-CM | POA: Diagnosis not present

## 2022-05-08 DIAGNOSIS — N183 Chronic kidney disease, stage 3 unspecified: Secondary | ICD-10-CM | POA: Diagnosis not present

## 2022-05-08 DIAGNOSIS — G894 Chronic pain syndrome: Secondary | ICD-10-CM | POA: Diagnosis not present

## 2022-05-08 DIAGNOSIS — D6869 Other thrombophilia: Secondary | ICD-10-CM | POA: Diagnosis not present

## 2022-05-08 DIAGNOSIS — E1122 Type 2 diabetes mellitus with diabetic chronic kidney disease: Secondary | ICD-10-CM | POA: Diagnosis not present

## 2022-05-08 DIAGNOSIS — M797 Fibromyalgia: Secondary | ICD-10-CM | POA: Diagnosis not present

## 2022-05-08 DIAGNOSIS — Z0000 Encounter for general adult medical examination without abnormal findings: Secondary | ICD-10-CM | POA: Diagnosis not present

## 2022-05-08 DIAGNOSIS — I480 Paroxysmal atrial fibrillation: Secondary | ICD-10-CM | POA: Diagnosis not present

## 2022-05-17 ENCOUNTER — Other Ambulatory Visit (HOSPITAL_BASED_OUTPATIENT_CLINIC_OR_DEPARTMENT_OTHER): Payer: Self-pay | Admitting: Family

## 2022-05-17 ENCOUNTER — Other Ambulatory Visit (HOSPITAL_COMMUNITY): Payer: Self-pay | Admitting: Cardiovascular Disease

## 2022-05-19 ENCOUNTER — Encounter: Payer: Self-pay | Admitting: Adult Health

## 2022-05-19 ENCOUNTER — Ambulatory Visit: Payer: Medicare Other | Admitting: Adult Health

## 2022-05-19 VITALS — BP 122/68 | HR 65 | Temp 98.1°F | Ht 63.5 in | Wt 177.0 lb

## 2022-05-19 DIAGNOSIS — G4733 Obstructive sleep apnea (adult) (pediatric): Secondary | ICD-10-CM | POA: Diagnosis not present

## 2022-05-19 NOTE — Progress Notes (Unsigned)
@Patient  ID: Yolanda Saunders, female    DOB: [birth year], 77 y.o.   MRN: [id number]  No chief complaint on file.   Referring provider: Johny Blamer, MD  HPI:  Legally blind.   TEST/EVENTS :   Just got cpap  Nasal mask. Takes off in middle of the night..   Allergies  Allergen Reactions   Codeine Other (See Comments)    "makes me crazy; see things; delusional"   Erythromycin Rash and Other (See Comments)    Peeling skin Skin turned "purplish"    Penicillins Rash and Other (See Comments)    Skin blisters   Shellfish Allergy Anaphylaxis and Swelling    Throat swells   Sulfonamide Derivatives Hives   Zestril [Lisinopril] Cough   Toprol Xl [Metoprolol] Diarrhea and Nausea And Vomiting    Rapid weight gain Hallucinations    Actos [Pioglitazone] Other (See Comments)    Upset GI   Aldactone [Spironolactone] Other (See Comments)    Unknown reaction   Amaryl [Glimepiride] Other (See Comments)    Upset GI   Apresoline [Hydralazine] Other (See Comments)    Unknown reaction   Benicar [Olmesartan] Other (See Comments)    Dizziness Headaches   Byetta 10 Mcg Pen [Exenatide] Nausea And Vomiting   Catapres [Clonidine Hcl] Other (See Comments)    Unknown reaction   Flexeril [Cyclobenzaprine] Other (See Comments)    Drowsiness    Glucotrol [Glipizide] Other (See Comments)    Upset GI   Glyburide-Metformin Other (See Comments)    Myalgias   Humalog [Insulin Lispro] Other (See Comments)    Headache Severe hyperglycemia   Invokana [Canagliflozin] Other (See Comments)    Yeast infections   Januvia [Sitagliptin] Other (See Comments)    UTI   Neurontin [Gabapentin] Other (See Comments)    Anxiety    Norvasc [Amlodipine] Other (See Comments)    Syncope   Prednisone Other (See Comments)    Unknown reaction   Propoxyphene Other (See Comments)    Unknown reaction Darvocet-N   Reglan [Metoclopramide] Other (See Comments)    Unknown reaction   Septra  [Sulfamethoxazole-Trimethoprim] Hives   Trimethoprim    Ultram [Tramadol] Other (See Comments)    Anxiety    Victoza [Liraglutide] Diarrhea    Upset GI   Zocor [Simvastatin] Other (See Comments)    Myalgias    Cozaar [Losartan Potassium] Rash    Upset GI   Novolin 70-30 [Insulin Nph Isophane & Regular] Swelling and Rash   Sulfa Antibiotics Hives and Rash    Immunization History  Administered Date(s) Administered   Influenza, High Dose Seasonal PF 11/01/2015, 11/27/2016   Influenza-Unspecified 09/21/2010   Pneumococcal-Unspecified 10/21/2010    Past Medical History:  Diagnosis Date   Anemia 04/17/11   "long, long years ago"   Angina 04/16/11   "that's what I'm here for"   Anxiety    Arthritis    Hips and knees   Asthma    CAD in native artery 08/07/2021   Carpal tunnel syndrome    Cataracts, bilateral    Chronic combined systolic and diastolic heart failure (HCC) 08/07/2020   CKD (chronic kidney disease), stage III (HCC)    Complication of anesthesia 1987   "affected my eyes; couldn't see anything but blurrs even the next day"   CVA (cerebral infarction)    After cardiac catheter 02/2000   Depression    Edema    Fibromyalgia    GERD (gastroesophageal reflux disease)    Headache(784.0)  High cholesterol    History of bronchitis    Hypertension    IBS (irritable bowel syndrome)    Migraines    PAF (paroxysmal atrial fibrillation) (HCC) 08/07/2021   Panic attacks 04/17/11   "don't take anything for it"   Pneumonia 04/17/11   "probably as many as 7 times"   Renal disorder 04/17/11   "they are working at 60% capacity"   Shortness of breath on exertion    "cause of my asthma"   Stroke (HCC) 2002   residual "problem w/using the right word, left 5 lesions on my brain/MRI; long term memory loss"   Type II diabetes mellitus (HCC)     Tobacco History: Social History   Tobacco Use  Smoking Status Former   Packs/day: 0.75   Years: 6.00   Additional pack years: 0.00    Total pack years: 4.50   Types: Cigarettes   Quit date: 01/21/1980   Years since quitting: 42.3  Smokeless Tobacco Never   Counseling given: Not Answered   Outpatient Medications Prior to Visit  Medication Sig Dispense Refill   apixaban (ELIQUIS) 5 MG TABS tablet Take 1 tablet by mouth twice daily 60 tablet 5   atorvastatin (LIPITOR) 80 MG tablet Take 1 tablet (80 mg total) by mouth daily. 30 tablet 0   Cephalexin 250 MG tablet Take 250 mg by mouth daily. Daily for UTI prevention.     Cholecalciferol (VITAMIN D-3 PO) Take 1 capsule by mouth daily.     dapagliflozin propanediol (FARXIGA) 10 MG TABS tablet Take 1 tablet (10 mg total) by mouth daily. 90 tablet 3   ENTRESTO 49-51 MG Take 1 tablet by mouth twice daily 60 tablet 0   gabapentin (NEURONTIN) 100 MG capsule Take 100 mg by mouth at bedtime.     glucose blood (ONETOUCH VERIO) test strip USE AS DIRECTED TO  CHECK  BLOOD  SUGAR  FOUR  TIMES  DAILY 50 each 0   insulin glargine (LANTUS) 100 UNIT/ML injection Inject 0.08-0.1 mLs (8-10 Units total) into the skin daily. 10 mL 11   insulin regular (NOVOLIN R) 100 units/mL injection Inject 10-14 Units into the skin 3 (three) times daily before meals. Per sliding scale. OTC Novolin R.     mupirocin ointment (BACTROBAN) 2 % Apply 1 Application topically 2 (two) times daily. 30 g 2   pantoprazole (PROTONIX) 40 MG tablet Take 1 tablet (40 mg total) by mouth daily. 30 tablet 0   sertraline (ZOLOFT) 50 MG tablet Take 50 mg by mouth daily.     spironolactone (ALDACTONE) 25 MG tablet Take 1/2 tablet (12.5 mg total) by mouth daily. 30 tablet 1   torsemide (DEMADEX) 20 MG tablet TAKE ONE TABLET BY MOUTH ON MONDAY, WEDNESDAY, AND FRIDAY. TAKE EXTRA DOSE FOR WEIGHT OVER 160 LBS. 21 tablet 0   vitamin B-12 (CYANOCOBALAMIN) 1000 MCG tablet Take 1,000 mcg by mouth daily.     doxycycline (VIBRA-TABS) 100 MG tablet Take 1 tablet (100 mg total) by mouth 2 (two) times daily. (Patient not taking: Reported on  05/19/2022) 20 tablet 0   No facility-administered medications prior to visit.     Review of Systems:   Constitutional:   No  weight loss, night sweats,  Fevers, chills, fatigue, or  lassitude.  HEENT:   No headaches,  Difficulty swallowing,  Tooth/dental problems, or  Sore throat,                No sneezing, itching, ear ache, nasal congestion,  post nasal drip,   CV:  No chest pain,  Orthopnea, PND, swelling in lower extremities, anasarca, dizziness, palpitations, syncope.   GI  No heartburn, indigestion, abdominal pain, nausea, vomiting, diarrhea, change in bowel habits, loss of appetite, bloody stools.   Resp: No shortness of breath with exertion or at rest.  No excess mucus, no productive cough,  No non-productive cough,  No coughing up of blood.  No change in color of mucus.  No wheezing.  No chest wall deformity  Skin: no rash or lesions.  GU: no dysuria, change in color of urine, no urgency or frequency.  No flank pain, no hematuria   MS:  No joint pain or swelling.  No decreased range of motion.  No back pain.    Physical Exam  BP 122/68 (BP Location: Right Arm, Patient Position: Sitting, Cuff Size: Large)   Pulse 65   Temp 98.1 F (36.7 C) (Oral)   Ht 5' 3.5" (1.613 m)   Wt 177 lb (80.3 kg)   SpO2 99%   BMI 30.86 kg/m   GEN: A/Ox3; pleasant , NAD, well nourished    HEENT:  [place]/AT,  EACs-clear, TMs-wnl, NOSE-clear, THROAT-clear, no lesions, no postnasal drip or exudate noted.   NECK:  Supple w/ fair ROM; no JVD; normal carotid impulses w/o bruits; no thyromegaly or nodules palpated; no lymphadenopathy.    RESP  Clear  P & A; w/o, wheezes/ rales/ or rhonchi. no accessory muscle use, no dullness to percussion  CARD:  RRR, no m/r/g, no peripheral edema, pulses intact, no cyanosis or clubbing.  GI:   Soft & nt; nml bowel sounds; no organomegaly or masses detected.   Musco: Warm bil, no deformities or joint swelling noted.   Neuro: alert, no focal deficits noted.     Skin: Warm, no lesions or rashes    Lab Results:  CBC    Component Value Date/Time   WBC 7.1 07/29/2021 0053   RBC 3.10 (L) 07/29/2021 0053   HGB 9.8 (L) 07/29/2021 0053   HGB 12.2 11/14/2020 1543   HCT 30.6 (L) 07/29/2021 0053   HCT 37.3 11/14/2020 1543   PLT 126 (L) 07/29/2021 0053   PLT 164 11/14/2020 1543   MCV 98.7 07/29/2021 0053   MCV 93 11/14/2020 1543   MCH 31.6 07/29/2021 0053   MCHC 32.0 07/29/2021 0053   RDW 13.6 07/29/2021 0053   RDW 13.8 11/14/2020 1543   LYMPHSABS 6.8 (H) 07/25/2021 1209   MONOABS 0.7 07/25/2021 1209   EOSABS 0.1 07/25/2021 1209   BASOSABS 0.1 07/25/2021 1209    BMET    Component Value Date/Time   NA 140 08/14/2021 1447   NA 143 08/07/2021 1455   K 3.7 08/14/2021 1447   CL 106 08/14/2021 1447   CO2 26 08/14/2021 1447   GLUCOSE 222 (H) 08/14/2021 1447   BUN 26 (H) 08/14/2021 1447   BUN 35 (H) 08/07/2021 1455   CREATININE 1.73 (H) 08/14/2021 1447   CREATININE 1.24 (H) 09/24/2018 1123   CALCIUM 9.6 08/14/2021 1447   GFRNONAA 30 (L) 08/14/2021 1447   GFRNONAA 42 (L) 09/24/2018 1123   GFRAA 49 (L) 09/24/2018 1123    BNP    Component Value Date/Time   BNP 173.8 (H) 08/14/2021 1447    ProBNP    Component Value Date/Time   PROBNP 34.5 04/16/2011 2337    Imaging: CT HEAD WO CONTRAST ( )  Result Date: 04/21/2022 CLINICAL DATA:  Dizziness, vision loss, previous CVA EXAM: CT HEAD  WITHOUT CONTRAST TECHNIQUE: Contiguous axial images were obtained from the base of the skull through the vertex without intravenous contrast. RADIATION DOSE REDUCTION: This exam was performed according to the departmental dose-optimization program which includes automated exposure control, adjustment of the mA and/or kV according to patient size and/or use of iterative reconstruction technique. COMPARISON:  12/13/2020 FINDINGS: Brain: No acute intracranial findings are seen. There are no signs of bleeding within the cranium. There is moderate size area  of old infarct in the right occipital lobe. Small old lacunar infarct is seen in left basal ganglia. Cortical sulci are prominent. There is decreased density in periventricular and subcortical white matter. Small calcifications are seen in lower pons with no significant change. Vascular: Unremarkable. Skull: No acute findings are seen. Sinuses/Orbits: Unremarkable. Other: No significant interval changes are noted. IMPRESSION: No acute intracranial findings are seen. No interval changes are noted involving infarcts. Atrophy. Small-vessel disease. Electronically Signed   By: Ernie Avena M.D.   On: 04/21/2022 13:00          No data to display          No results found for: "NITRICOXIDE"      Assessment & Plan:   No problem-specific Assessment & Plan notes found for this encounter.     Rubye Oaks, NP 05/19/2022

## 2022-05-19 NOTE — Patient Instructions (Signed)
Wear CPAP At bedtime  , wear all night long for at least 6hr or more   Mask fitting.  Work on healthy weight  Use caution with sedating medications.  Do not drive if sleepy  Follow up in 3 months with Dr. Craige Cotta  or Nyima Vanacker NP and As needed

## 2022-05-19 NOTE — Telephone Encounter (Signed)
Rx(s) sent to pharmacy electronically.  

## 2022-05-19 NOTE — Telephone Encounter (Signed)
Pt last saw Dr Duke Salvia 08/07/21, Last labs 08/14/21 Creat 1.73, age 77, weight 75.7kg, based on specified criteria pt is on appropriate dosage of Eliquis 5mg  BID for afib.  Will refill rx.

## 2022-05-20 ENCOUNTER — Ambulatory Visit: Payer: Medicare Other | Admitting: Podiatry

## 2022-05-20 ENCOUNTER — Ambulatory Visit (INDEPENDENT_AMBULATORY_CARE_PROVIDER_SITE_OTHER): Payer: Medicare Other

## 2022-05-20 ENCOUNTER — Other Ambulatory Visit: Payer: Self-pay | Admitting: *Deleted

## 2022-05-20 DIAGNOSIS — L539 Erythematous condition, unspecified: Secondary | ICD-10-CM | POA: Diagnosis not present

## 2022-05-20 DIAGNOSIS — M10072 Idiopathic gout, left ankle and foot: Secondary | ICD-10-CM | POA: Diagnosis not present

## 2022-05-20 DIAGNOSIS — G4733 Obstructive sleep apnea (adult) (pediatric): Secondary | ICD-10-CM

## 2022-05-20 DIAGNOSIS — L600 Ingrowing nail: Secondary | ICD-10-CM | POA: Diagnosis not present

## 2022-05-20 NOTE — Progress Notes (Signed)
Reviewed and agree with assessment/plan.   Travonta Gill, MD [facility] 05/20/2022, 12:42 PM Pager:  [phone]  

## 2022-05-20 NOTE — Progress Notes (Signed)
Subjective: Chief Complaint  Patient presents with   Ingrown Toenail    Rm 13 Left great lateral hallux ingrown pain x 3 months. Nail bed soreness w/o drainage or bleeding.    Toe Pain    Pt states toe pain has improved. Left foot only.    77 year old female presents the office for above concerns.  She states the left ingrown toenails been doing well and feeling better.  No drainage or pus.  She still gets over the left second toe has she has had some redness and swelling to the joint point of the DIPJ.  She is concerned about gout.  Objective: AAO x3, NAD DP/PT pulses palpable bilaterally, CRT less than 3 seconds Mild incurvation left hallux toenail without any edema, erythema, drainage or pus.  No significant pain on exam today.  On the left second DIPJ there is decreased range of motion there is a lot of edema erythema present at the joint.  There is no open lesion. No pain with calf compression, swelling, warmth, erythema  Assessment: 77 year old female with ingrown toenail left hallux without signs of infection, left second toe erythema, possible gout.  Plan: -All treatment options discussed with the patient including all alternatives, risks, complications.  -For the ingrown toenail we discussed partial nail avulsion versus continue with conservative treatment.  If she wants to proceed with continuing conservative treatment.  Then sharply debride the site and complications or bleeding.  Monitor for any signs or symptoms of recurrence or infection -Will check uric acid level -Patient encouraged to call the office with any questions, concerns, change in symptoms.   Vivi Barrack DPM

## 2022-05-20 NOTE — Assessment & Plan Note (Signed)
Moderate obstructive sleep apnea.  Patient education on importance of CPAP compliance.  Advised that she needs to go in for a mask fitting and change mask to help with compliance. Patient education on potential complications of untreated sleep apnea If not able to tolerate could consider inspire.   Plan  Patient Instructions  Wear CPAP At bedtime  , wear all night long for at least 6hr or more   Mask fitting.  Work on healthy weight  Use caution with sedating medications.  Do not drive if sleepy  Follow up in 3 months with Dr. Craige Cotta  or Jacey Eckerson NP and As needed

## 2022-05-21 LAB — URIC ACID: Uric Acid, Serum: 7.2 mg/dL — ABNORMAL HIGH (ref 2.5–7.0)

## 2022-05-23 ENCOUNTER — Other Ambulatory Visit: Payer: Self-pay | Admitting: Podiatry

## 2022-05-23 MED ORDER — ALLOPURINOL 100 MG PO TABS: 100.0000 mg | ORAL_TABLET | Freq: Every day | ORAL | 0 refills | Status: DC

## 2022-05-27 ENCOUNTER — Ambulatory Visit: Payer: Medicare Other | Admitting: Internal Medicine

## 2022-05-27 ENCOUNTER — Encounter: Payer: Self-pay | Admitting: Internal Medicine

## 2022-05-27 VITALS — BP 118/68 | HR 78 | Ht 63.5 in | Wt 174.0 lb

## 2022-05-27 DIAGNOSIS — Z794 Long term (current) use of insulin: Secondary | ICD-10-CM

## 2022-05-27 DIAGNOSIS — E119 Type 2 diabetes mellitus without complications: Secondary | ICD-10-CM

## 2022-05-27 DIAGNOSIS — Z7984 Long term (current) use of oral hypoglycemic drugs: Secondary | ICD-10-CM

## 2022-05-27 DIAGNOSIS — E785 Hyperlipidemia, unspecified: Secondary | ICD-10-CM | POA: Diagnosis not present

## 2022-05-27 DIAGNOSIS — N183 Chronic kidney disease, stage 3 unspecified: Secondary | ICD-10-CM | POA: Diagnosis not present

## 2022-05-27 DIAGNOSIS — E1122 Type 2 diabetes mellitus with diabetic chronic kidney disease: Secondary | ICD-10-CM

## 2022-05-27 LAB — POCT GLYCOSYLATED HEMOGLOBIN (HGB A1C): Hemoglobin A1C: 7.8 % — AB (ref 4.0–5.6)

## 2022-05-27 MED ORDER — ONETOUCH VERIO VI STRP
ORAL_STRIP | 3 refills | Status: AC
Start: 2022-05-27 — End: ?

## 2022-05-27 MED ORDER — DAPAGLIFLOZIN PROPANEDIOL 10 MG PO TABS: 10.0000 mg | ORAL_TABLET | Freq: Every day | ORAL | 3 refills | Status: DC

## 2022-05-27 NOTE — Progress Notes (Signed)
Patient ID: Yolanda Saunders, female   DOB: 12/30/1945, 77 y.o.   MRN: [id number]  HPI: Yolanda Saunders is a 77 y.o.-year-old female, returning for follow-up for DM2, dx 1999, insulin-dependent since ~2012, uncontrolled, with complications (CHF, CKD stage 3, h/o stroke post cardiac cath in 2002 and 06/2020, macroalbuminuria). She saw Dr Sharl Ma in the past.  Last visit with me 6 months ago.  She is here alone, previously was here with her niece, who is her caregiver.  Interim history: Her husband passed away before last visit.  Also, her son passed away and she had a stroke 1 day after, in 06/2020 >>  developed blindness in the L eye and also, R eye vision affected (worse), dyslexia. Now in a rest home (ALF): [facility]. She likes it there.  She has been to home apartment.  Her medicines are prepared for her so she is not sure exactly what doses of insulin and other medications she is taking. No increased urination, nausea, chest pain.   Last hemoglobin A1c was: Lab Results  Component Value Date   HGBA1C 7.5 (A) 11/14/2021   HGBA1C 7.8 (H) 07/25/2021   HGBA1C 7.2 (H) 08/03/2020  09/21/2019: HbA1c 11.5% 07/31/2014: 13.4%   In the past, she had a period in which she could not afford analog insulin products  >> we changed to:  Insulin Before breakfast Before lunch Before dinner  Regular 25 - smaller meal 30 - larger meal 25 - smaller meal 30 - larger meal 25 - smaller meal 30 - larger meal  NPH 40  30    Currently on: - Lantus 40 units twice a day >> (vials)   30 >> 20 units in am >> off >> 8-10 units daily - Regular (ReliOn): 34 >> 15-20 >> 8-12 units before meals - Farxiga 10 mg before b'fast - added this summer Try to take 5-10 units if you have a snack after dinner.  We stopped Invokana 100 mg before b'fast >> lethargy, fatigue -  in 11/2015 I suggested Bydureon 2 mg weekly - in donut hole >> could not start Tried Victoza >> helped, but AP She stopped Metformin ER 500  mg 2x a day because of AP, but does have IBS. She feels better after she stopped Tried Byetta >> N/V Tried Metformin >> AP, diarrhea, gas Tried SU >> upset stomach, nausea Has been on Invokana before >> yeast inf She had elevated lipase (72) in 09/2011 while on Victoza.  She also reportedly had pancreatitis at the beginning of 2019.  Patient checks blood sugars sugars at home, but she does not remember the values... - am: 47, 52-119, 169, 173, 290  >> 115-182 >> ? - 2h after b'fast: 58, 214-265 >> 218 >> n/c >> 62, 64 >> n/c - before lunch:  62-144 >> 353/130s >> 63, 68-132, 162 >> n/c  - 2h after lunch: 73-199, 225, 266 >> 30 min after: 157-265 >> ? - before dinner: 257, 285 >> 395/180s >> 86-199 >> n/c - 2h after dinner:  223 >> 425/180-185 >> 73-160 >> 93-173 >> ? - bedtime:  196, 311 >> 409 >> 56, 102-153, 202, 205 >> n/c - nighttime:  321, 332 >> 252-349 >> 402 >> 46, 178 >> n/c Lowest sugar was: 42 >> 46 >> 40s - at the [place].  she has hypoglycemia awareness in the 60s Highest sugar was 600s on Humalog >> .Marland KitchenMarland Kitchen300s >> 200s.   Glucometer: Free Style  Pt's meals are: - Breakfast:  oatmeal - Lunch: sandwich; chicken nuggets and fries - Dinner: heaviest: meat + 2 veggies + dessert later - Snacks: "too many" - cookies, sugary snacks  -+ CKD, last BUN/creatinine: Lab Results  Component Value Date   BUN 26 (H) 08/14/2021   CREATININE 1.73 (H) 08/14/2021  Received labs from PCP 09/21/2019: HbA1c 11.5%, glucose 373, BUN/creatinine 21/1.41, GFR 36  + MAU: Lab Results  Component Value Date   MICRALBCREAT 32.0 (H) 02/11/2016  04/2014: ACR: >300 She sees nephrology. Intolerant to ACEI/ARBs. On Entresto.  -+ HL; last set of lipids: Lab Results  Component Value Date   CHOL 114 09/25/2020   HDL 33 (L) 09/25/2020   LDLCALC 59 09/25/2020   LDLDIRECT 144.0 09/24/2018   TRIG 39 07/26/2021   CHOLHDL 3.5 09/25/2020  09/21/2019: 236/255/37/151 07/09/2016: 175/122/36/150 On Lipitor 80 <<  40.  - last eye exam was in 2022: + DR. She had cataract surgery.  -She denies numbness and tingling in her feet.  She was seeing Dr. Nita Sickle >> investigation for disequllibrium and word difficulty. ? MS. She also has HTN,  anemia, GERD.  During investigation of her back pain in 02/2020, she was found to have diverticulitis.  This was treated.  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  ROS: + see HPI  I reviewed pt's medications, allergies, PMH, social hx, family hx, and changes were documented in the history of present illness. Otherwise, unchanged from my initial visit note.  Past Medical History:  Diagnosis Date   Anemia 04/17/11   "long, long years ago"   Angina 04/16/11   "that's what I'm here for"   Anxiety    Arthritis    Hips and knees   Asthma    CAD in native artery 08/07/2021   Carpal tunnel syndrome    Cataracts, bilateral    Chronic combined systolic and diastolic heart failure (HCC) 08/07/2020   CKD (chronic kidney disease), stage III (HCC)    Complication of anesthesia 1987   "affected my eyes; couldn't see anything but blurrs even the next day"   CVA (cerebral infarction)    After cardiac catheter 02/2000   Depression    Edema    Fibromyalgia    GERD (gastroesophageal reflux disease)    Headache(784.0)    High cholesterol    History of bronchitis    Hypertension    IBS (irritable bowel syndrome)    Migraines    PAF (paroxysmal atrial fibrillation) (HCC) 08/07/2021   Panic attacks 04/17/11   "don't take anything for it"   Pneumonia 04/17/11   "probably as many as 7 times"   Renal disorder 04/17/11   "they are working at 60% capacity"   Shortness of breath on exertion    "cause of my asthma"   Stroke (HCC) 2002   residual "problem w/using the right word, left 5 lesions on my brain/MRI; long term memory loss"   Type II diabetes mellitus (HCC)    Past Surgical History:  Procedure Laterality Date   CARDIAC CATHETERIZATION  2002   CARPAL TUNNEL  RELEASE  2003-2010   "twice on left; once on right"   CHOLECYSTECTOMY  1987   DEBRIDEMENT TENNIS ELBOW  2010   EYE SURGERY Bilateral 11/2019   Lenses Implant   histerectomy     LUMBAR LAMINECTOMY/ DECOMPRESSION WITH MET-RX Right 06/01/2020   Procedure: Right Lumbar Four-Five Minimally invasive discectomy;  Surgeon: Jadene Pierini, MD;  Location: [facility] OR;  Service: Neurosurgery;  Laterality: Right;  RIGHT/LEFT HEART CATH AND CORONARY ANGIOGRAPHY N/A 08/07/2020   Procedure: RIGHT/LEFT HEART CATH AND CORONARY ANGIOGRAPHY;  Surgeon: Lyn Records, MD;  Location: [facility] INVASIVE CV LAB;  Service: Cardiovascular;  Laterality: N/A;   VAGINAL HYSTERECTOMY  1977   Social History   Social History   Marital Status: Married   Social History Main Topics   Smoking status: Former Smoker -- 0.75 packs/day for 6 years    Types: Cigarettes    Quit date: 01/21/1980   Smokeless tobacco: Never Used   Alcohol Use: No   Drug Use: No   Social History Narrative   Lives with husband in a one story home.  Has 2 children.  Retired from [employer].  Education: 12th grade.  Trade schools.    Current Outpatient Medications on File Prior to Visit  Medication Sig Dispense Refill   allopurinol (ZYLOPRIM) 100 MG tablet Take 1 tablet (100 mg total) by mouth daily. 30 tablet 0   apixaban (ELIQUIS) 5 MG TABS tablet Take 1 tablet by mouth twice daily 60 tablet 5   atorvastatin (LIPITOR) 80 MG tablet Take 1 tablet (80 mg total) by mouth daily. 30 tablet 0   Cephalexin 250 MG tablet Take 250 mg by mouth daily. Daily for UTI prevention.     Cholecalciferol (VITAMIN D-3 PO) Take 1 capsule by mouth daily.     dapagliflozin propanediol (FARXIGA) 10 MG TABS tablet Take 1 tablet (10 mg total) by mouth daily. 90 tablet 3   doxycycline (VIBRA-TABS) 100 MG tablet Take 1 tablet (100 mg total) by mouth 2 (two) times daily. (Patient not taking: Reported on 05/19/2022) 20 tablet 0   ENTRESTO 49-51 MG Take 1 tablet by mouth twice  daily 60 tablet 0   gabapentin (NEURONTIN) 100 MG capsule Take 100 mg by mouth at bedtime.     glucose blood (ONETOUCH VERIO) test strip USE AS DIRECTED TO  CHECK  BLOOD  SUGAR  FOUR  TIMES  DAILY 50 each 0   insulin glargine (LANTUS) 100 UNIT/ML injection Inject 0.08-0.1 mLs (8-10 Units total) into the skin daily. 10 mL 11   insulin regular (NOVOLIN R) 100 units/mL injection Inject 10-14 Units into the skin 3 (three) times daily before meals. Per sliding scale. OTC Novolin R.     mupirocin ointment (BACTROBAN) 2 % Apply 1 Application topically 2 (two) times daily. 30 g 2   pantoprazole (PROTONIX) 40 MG tablet Take 1 tablet (40 mg total) by mouth daily. 30 tablet 0   sertraline (ZOLOFT) 50 MG tablet Take 50 mg by mouth daily.     spironolactone (ALDACTONE) 25 MG tablet Take 1/2 tablet (12.5 mg total) by mouth daily. 30 tablet 1   torsemide (DEMADEX) 20 MG tablet TAKE ONE TABLET BY MOUTH ON MONDAY, WEDNESDAY, AND FRIDAY. TAKE EXTRA DOSE FOR WEIGHT OVER 160 LBS. 21 tablet 0   vitamin B-12 (CYANOCOBALAMIN) 1000 MCG tablet Take 1,000 mcg by mouth daily.     No current facility-administered medications on file prior to visit.   Allergies  Allergen Reactions   Codeine Other (See Comments)    "makes me crazy; see things; delusional"   Erythromycin Rash and Other (See Comments)    Peeling skin Skin turned "purplish"    Penicillins Rash and Other (See Comments)    Skin blisters   Shellfish Allergy Anaphylaxis and Swelling    Throat swells   Sulfonamide Derivatives Hives   Zestril [Lisinopril] Cough   Toprol Xl [Metoprolol] Diarrhea and Nausea And  Vomiting    Rapid weight gain Hallucinations    Actos [Pioglitazone] Other (See Comments)    Upset GI   Aldactone [Spironolactone] Other (See Comments)    Unknown reaction   Amaryl [Glimepiride] Other (See Comments)    Upset GI   Apresoline [Hydralazine] Other (See Comments)    Unknown reaction   Benicar [Olmesartan] Other (See Comments)     Dizziness Headaches   Byetta 10 Mcg Pen [Exenatide] Nausea And Vomiting   Catapres [Clonidine Hcl] Other (See Comments)    Unknown reaction   Flexeril [Cyclobenzaprine] Other (See Comments)    Drowsiness    Glucotrol [Glipizide] Other (See Comments)    Upset GI   Glyburide-Metformin Other (See Comments)    Myalgias   Humalog [Insulin Lispro] Other (See Comments)    Headache Severe hyperglycemia   Invokana [Canagliflozin] Other (See Comments)    Yeast infections   Januvia [Sitagliptin] Other (See Comments)    UTI   Neurontin [Gabapentin] Other (See Comments)    Anxiety    Norvasc [Amlodipine] Other (See Comments)    Syncope   Prednisone Other (See Comments)    Unknown reaction   Propoxyphene Other (See Comments)    Unknown reaction Darvocet-N   Reglan [Metoclopramide] Other (See Comments)    Unknown reaction   Septra [Sulfamethoxazole-Trimethoprim] Hives   Trimethoprim    Ultram [Tramadol] Other (See Comments)    Anxiety    Victoza [Liraglutide] Diarrhea    Upset GI   Zocor [Simvastatin] Other (See Comments)    Myalgias    Cozaar [Losartan Potassium] Rash    Upset GI   Novolin 70-30 [Insulin Nph Isophane & Regular] Swelling and Rash   Sulfa Antibiotics Hives and Rash   Family History  Problem Relation Age of Onset   Coronary artery disease Father    Diabetes Mellitus I Father    CVA Mother    Stroke Mother    Diabetes Mellitus I Mother    Diabetes Mellitus I Sister    Diabetes Mellitus I Brother    Diabetes Mellitus I Maternal Grandmother    Diabetes Mellitus I Maternal Grandfather    Diabetes Mellitus I Paternal Grandmother    Diabetes Mellitus I Paternal Grandfather    Diabetes Mellitus I Brother    Aortic aneurysm Son    PE: BP 118/68 (BP Location: Right Arm, Patient Position: Sitting, Cuff Size: Normal)   Pulse 78   Ht 5' 3.5" (1.613 m)   Wt 174 lb (78.9 kg)   SpO2 99%   BMI 30.34 kg/m   Wt Readings from Last 3 Encounters:  05/27/22 174 lb (78.9  kg)  05/19/22 177 lb (80.3 kg)  11/14/21 166 lb 12.8 oz (75.7 kg)   Constitutional: Slightly overweight, in NAD ENT: no neck masses, no cervical lymphadenopathy Cardiovascular: RRR, No MRG Respiratory: CTA B Musculoskeletal: no deformities Skin:no rashes Neurological: no tremor with outstretched hands  ASSESSMENT: 1. DM2, insulin-dependent, uncontrolled, with complications - CHF - CKD stage 3 - h/o stroke post cardiac cath in 2002 - macroalbuminuria >> improved - Lipoatrophy at the site of insulin injections  -Per patient's niece: she tried to obtain a CGM for the patient but she was not able to do so (2023)  2. Obesity class 2  3. HL  PLAN:  1. Patient with longstanding, uncontrolled, type 2 diabetes, insulin-dependent, on SGLT2 inhibitor and basal/bolus insulin, returning alone today.  Unfortunately, she cannot remember blood sugars and she does not bring her meter.  I strongly  advised her to bring it at next visit.  She does not see well enough to write the blood sugars down. -She also cannot remember exactly the doses of her insulins but she mentions that they are once recommended at last visit and these are prepared for her by her nurse. -HbA1c today slightly higher (see below), but due to the fact that we do not have further information about her blood sugars, we will continue the current regimen for now. - I advised her to:  Patient Instructions  Please continue: - Farxiga 10 mg before b'fast - R insulin: 6-10 units 30 min before meals - Lantus 8(-10) units daily in am  Please return in 3-4 months with your sugar log or meter.   - we checked her HbA1c: 7.8% (higher) - advised to check sugars at different times of the day - 4x a day, rotating check times - advised for yearly eye exams >> she is UTD - return to clinic in 3-4 months  2. . Obesity class 2 -She continues on insulin, which is weight inducing, but she started [place] before last visit and lost 27  pounds! -Since then, she gained 8 pounds back -Will continue [place].  3. HL - Reviewed latest lipid panel from 09/2020: LDL above our target of less than 55 due to history of cardiovascular disease, HDL slightly low: Lab Results  Component Value Date   CHOL 114 09/25/2020   HDL 33 (L) 09/25/2020   LDLCALC 59 09/25/2020   LDLDIRECT 144.0 09/24/2018   TRIG 39 07/26/2021   CHOLHDL 3.5 09/25/2020  -Continues on Lipitor 80 mg daily without side effects -Will need records from PCP.  I was not able to review them in Care Everywhere.  Carlus Pavlov, MD PhD [facility]

## 2022-05-27 NOTE — Addendum Note (Signed)
Addended by: Kenyon Ana on: 05/27/2022 04:29 PM   Modules accepted: Orders

## 2022-05-27 NOTE — Patient Instructions (Addendum)
Please continue: - Farxiga 10 mg before b'fast - R insulin: 6-10 units 30 min before meals - Lantus 8(-10) units daily in am  Please return in 3-4 months with your sugar log or meter.

## 2022-06-02 ENCOUNTER — Other Ambulatory Visit (HOSPITAL_BASED_OUTPATIENT_CLINIC_OR_DEPARTMENT_OTHER): Payer: Medicare Other

## 2022-06-04 DIAGNOSIS — N3021 Other chronic cystitis with hematuria: Secondary | ICD-10-CM | POA: Diagnosis not present

## 2022-06-06 ENCOUNTER — Ambulatory Visit (HOSPITAL_BASED_OUTPATIENT_CLINIC_OR_DEPARTMENT_OTHER): Payer: Medicare Other | Admitting: Family

## 2022-06-06 ENCOUNTER — Encounter (HOSPITAL_BASED_OUTPATIENT_CLINIC_OR_DEPARTMENT_OTHER): Payer: Self-pay | Admitting: Family

## 2022-06-06 VITALS — BP 130/64 | HR 75 | Ht 63.5 in | Wt 174.0 lb

## 2022-06-06 DIAGNOSIS — I480 Paroxysmal atrial fibrillation: Secondary | ICD-10-CM | POA: Diagnosis not present

## 2022-06-06 DIAGNOSIS — D6859 Other primary thrombophilia: Secondary | ICD-10-CM

## 2022-06-06 DIAGNOSIS — I10 Essential (primary) hypertension: Secondary | ICD-10-CM | POA: Diagnosis not present

## 2022-06-06 DIAGNOSIS — E785 Hyperlipidemia, unspecified: Secondary | ICD-10-CM | POA: Diagnosis not present

## 2022-06-06 DIAGNOSIS — N1831 Chronic kidney disease, stage 3a: Secondary | ICD-10-CM | POA: Diagnosis not present

## 2022-06-06 DIAGNOSIS — I5042 Chronic combined systolic (congestive) and diastolic (congestive) heart failure: Secondary | ICD-10-CM | POA: Diagnosis not present

## 2022-06-06 MED ORDER — TORSEMIDE 20 MG PO TABS: ORAL_TABLET | ORAL | Status: DC

## 2022-06-06 NOTE — Progress Notes (Unsigned)
Office Visit    Patient Name: Yolanda Saunders Date of Encounter: 06/06/2022  PCP:  Johny Blamer, MD   [facility]  Cardiologist:  Chilton Si, MD  Advanced Practice Provider:  No care team member to display Electrophysiologist:  Lanier Prude, MD      Chief Complaint    Yolanda Saunders is a 77 y.o. female  presents today for heart failure follow-up  Past Medical History    Past Medical History:  Diagnosis Date   Anemia 04/17/11   "long, long years ago"   Angina 04/16/11   "that's what I'm here for"   Anxiety    Arthritis    Hips and knees   Asthma    CAD in native artery 08/07/2021   Carpal tunnel syndrome    Cataracts, bilateral    Chronic combined systolic and diastolic heart failure (HCC) 08/07/2020   CKD (chronic kidney disease), stage III (HCC)    Complication of anesthesia 1987   "affected my eyes; couldn't see anything but blurrs even the next day"   CVA (cerebral infarction)    After cardiac catheter 02/2000   Depression    Edema    Fibromyalgia    GERD (gastroesophageal reflux disease)    Headache(784.0)    High cholesterol    History of bronchitis    Hypertension    IBS (irritable bowel syndrome)    Migraines    PAF (paroxysmal atrial fibrillation) (HCC) 08/07/2021   Panic attacks 04/17/11   "don't take anything for it"   Pneumonia 04/17/11   "probably as many as 7 times"   Renal disorder 04/17/11   "they are working at 60% capacity"   Shortness of breath on exertion    "cause of my asthma"   Stroke (HCC) 2002   residual "problem w/using the right word, left 5 lesions on my brain/MRI; long term memory loss"   Type II diabetes mellitus (HCC)    Past Surgical History:  Procedure Laterality Date   CARDIAC CATHETERIZATION  2002   CARPAL TUNNEL RELEASE  2003-2010   "twice on left; once on right"   CHOLECYSTECTOMY  1987   DEBRIDEMENT TENNIS ELBOW  2010   EYE SURGERY Bilateral 11/2019   Lenses Implant    histerectomy     LUMBAR LAMINECTOMY/ DECOMPRESSION WITH MET-RX Right 06/01/2020   Procedure: Right Lumbar Four-Five Minimally invasive discectomy;  Surgeon: Jadene Pierini, MD;  Location: [facility] OR;  Service: Neurosurgery;  Laterality: Right;   RIGHT/LEFT HEART CATH AND CORONARY ANGIOGRAPHY N/A 08/07/2020   Procedure: RIGHT/LEFT HEART CATH AND CORONARY ANGIOGRAPHY;  Surgeon: Lyn Records, MD;  Location: [facility] INVASIVE CV LAB;  Service: Cardiovascular;  Laterality: N/A;   VAGINAL HYSTERECTOMY  1977    Allergies  Allergies  Allergen Reactions   Codeine Other (See Comments)    "makes me crazy; see things; delusional"   Erythromycin Rash and Other (See Comments)    Peeling skin Skin turned "purplish"    Penicillins Rash and Other (See Comments)    Skin blisters   Shellfish Allergy Anaphylaxis and Swelling    Throat swells   Sulfonamide Derivatives Hives   Zestril [Lisinopril] Cough   Toprol Xl [Metoprolol] Diarrhea and Nausea And Vomiting    Rapid weight gain Hallucinations    Actos [Pioglitazone] Other (See Comments)    Upset GI   Aldactone [Spironolactone] Other (See Comments)    Unknown reaction   Amaryl [Glimepiride] Other (See Comments)  Upset GI   Apresoline [Hydralazine] Other (See Comments)    Unknown reaction   Benicar [Olmesartan] Other (See Comments)    Dizziness Headaches   Byetta 10 Mcg Pen [Exenatide] Nausea And Vomiting   Catapres [Clonidine Hcl] Other (See Comments)    Unknown reaction   Flexeril [Cyclobenzaprine] Other (See Comments)    Drowsiness    Glucotrol [Glipizide] Other (See Comments)    Upset GI   Glyburide-Metformin Other (See Comments)    Myalgias   Humalog [Insulin Lispro] Other (See Comments)    Headache Severe hyperglycemia   Invokana [Canagliflozin] Other (See Comments)    Yeast infections   Januvia [Sitagliptin] Other (See Comments)    UTI   Neurontin [Gabapentin] Other (See Comments)    Anxiety    Norvasc [Amlodipine] Other (See  Comments)    Syncope   Prednisone Other (See Comments)    Unknown reaction   Propoxyphene Other (See Comments)    Unknown reaction Darvocet-N   Reglan [Metoclopramide] Other (See Comments)    Unknown reaction   Septra [Sulfamethoxazole-Trimethoprim] Hives   Trimethoprim    Ultram [Tramadol] Other (See Comments)    Anxiety    Victoza [Liraglutide] Diarrhea    Upset GI   Zocor [Simvastatin] Other (See Comments)    Myalgias    Cozaar [Losartan Potassium] Rash    Upset GI   Novolin 70-30 [Insulin Nph Isophane & Regular] Swelling and Rash   Sulfa Antibiotics Hives and Rash    History of Present Illness    [name] is a 77 y.o. female with a hx of HTN, DM2, PAF, HLD, CKD3, embolic stroke, COVID 19, HFrEF, CAD, back pain last seen by heart failure clinic 08/28/2021.  Admitted 08/02/20 with acute CVA. CTA head and neck with acute right P2 occlusion with severe 75% stenosis in right PCA with severe distal left P2 stenosis. MRI brain with moderate size acute right PCA distribution infarct, underlying moderate chronic microvascular ischemic disease, few scattered remote lacunar infarct in basal ganglia and thalamus. Echo during admission LVEF 35-40%. Tested positive for COVID19 during admission and treated with remdesivir. Underwent cardiac cath for evaluation of reduced LVEF with CTO of mid LAD with left to right and right to left collaterals, moderate diffuse atherosclerosis. She was discharged on low dose Carvedilol and Avapro. Noted ot have multiple medication intolerances. Discharged with 30 day monitor as not loop candidate. She was recommended for outpatient cardiac MRI to rule out LV thrombus. LDL during admission 221, suspected not to be taking Lipitor at home and dose was increased from 40mg  to 80mg .   Seen in follow up 09/07/20 while wearing 30 day monitor which ended up revealing PAF and Eliquis was initiated. Cardiac MRI was ordered and performed 10/2020 showing LVEF 38%, mildly  increased left ventricular size, late gadolinium enhancement in the myocardium suggestive of infarction, no LV thrombus, RV normal size and function.  Evidence 07/2021 with acute on chronic systolic heart failure hypoxic respiratory failure.  Initially required BiPAP and subsequently intubation.  Echo revealed further drop in LVEF 20-25% global hypokinesis, grade 2 diastolic dysfunction, mildly reduced RV function.  She was discharged on Entresto and spironolactone.  Carvedilol held due to bradycardia.  At follow-up 08/07/2021 she was resumed on low-dose beta-blocker carvedilol 3.125 mg twice daily.  Plan to repeat echocardiogram in 3 months and if LVEF no improvement consider relook LHC and possible ICD placement.  At visit with her [facility] 08/14/2021 A discontinued due to fatigue.  Sherryll Burger  dose increased and diuretic transitioned to as needed.   Last saw heart failure clinic 08/28/2021.  Weight at home is trending up 156 ? 161 lbs. she was recommended continue Entresto 49-51 mg BID, renal lactone 12.5 mg QD, Farxiga 10 mgQD, and start Torsemide 20mg  M-W-F with additional dose for weight >160 lbs. plan was if she remains symptomatic to consider addition of digoxin pending renal function or consider impulse TCM.  It was noted by Dr. Prescott Gum review that doubted any role for attempted revascularization is unlikely to improve LVEF and no anginal symptoms.  Updated echo 11/20/2021 with improved LVEF 70 to 75%, mild LVH, grade 1 diastolic dysfunction, no RWMA, elevated LVEDP, RV normal, mild AI, mild dilation ascending aorta 38 mm.  She presents today for follow-up.  Notes occasional chest discomfort at rest which self resolves. Not assoiated with dyspnea nor diaphoresis and described as "sharp". She has moved into [facility]) since her husband's passing a year ago and is getting back to regularly scheduled medical appointments. Her niece assists with pill box and reminder calls for medications as  prior to moving was missing doses at home due to forgetfulness. She just started CPAP 4-5 weeks ago and is using regularly. Planning to participate in exercise activities at [facility]. No edema, orthopnea, PND. Exertional dyspnea stable at baseline.   EKGs/Labs/Other Studies Reviewed:   The following studies were reviewed today:  Cardiac MRI 11/08/20   FINDINGS: 1. Mildly increased left ventricular size, with LVEDD 52 mm, but LVEDVi 95 mL/m2.   Normal left ventricular thickness, with intraventricular septal thickness of 8 mm, posterior wall thickness of 6 mm, and myocardial mass index of 51 g/m2.   Moderately reduced left ventricular systolic function (LVEF = 38%). There are regional wall motion abnormalities: There is hypokinesis of the mid anterior, anteroseptal, inferoseptal, and inferior segments. There is akinesis of the apex and all apical segments.   Left ventricular parametric mapping notable for normal T2 signal mild increase in the mid LV segments (30-40%) and significant increase in the apex (46-54%) .   There is late gadolinium enhancement in the left ventricular myocardium: There is apical inferior 75 % LGE suggestive of infarction. There is apical anterior septum and anterior remodeling and thinning.   No LV thrombus noted.   2. Normal right ventricular size with RVEDVI 65 mL/m2.   Normal right ventricular thickness.   Normal right ventricular systolic function (RVEF =56%). There are no regional wall motion abnormalities or aneurysms.   3.  Normal left and right atrial size.   4. Normal size of the aortic root, ascending aorta and pulmonary artery.   5.  Qualitatively no significant valvular abnormalities.   6.  Normal pericardium.  No pericardial effusion.   7. Grossly, no extracardiac findings. Recommended dedicated study if concerned for non-cardiac pathology.   IMPRESSION: Ischemic Cardiomyopathy and evidence of LAD infarct. No LV  thrombus noted. ____________________________________________  Echocardiogram 08/03/2020: Impressions: 1. No left ventricular thrombus is seen with Definity contrast. Left  ventricular ejection fraction, by estimation, is 35 to 40%. The left  ventricle has moderately decreased function. The left ventricle has no  regional wall motion abnormalities. Left  ventricular diastolic parameters are consistent with Grade I diastolic  dysfunction (impaired relaxation). There is severe hypokinesis of the left  ventricular, mid-apical anteroseptal wall and anterior wall. There is mild  dyskinesis of the left  ventricular, entire apical segment.   2. Right ventricular systolic function is normal. The right ventricular  size is normal. Tricuspid regurgitation signal is inadequate for assessing  PA pressure.   3. The mitral valve is normal in structure. Trivial mitral valve  regurgitation. No evidence of mitral stenosis.   4. The aortic valve is normal in structure. Aortic valve regurgitation is  mild. No aortic stenosis is present.   5. The inferior vena cava is normal in size with greater than 50%  respiratory variability, suggesting right atrial pressure of 3 mmHg.   Comparison(s): Prior images unable to be directly viewed, comparison made  by report only. Changes from prior study are noted. The left ventricular  function is significantly worse. The left ventricular wall motion  abnormalities are new. _______________   Right/Left Cardiac Catheterization 08/07/2020:   Mid LAD lesion is 100% stenosed.   2nd Diag lesion is 100% stenosed.   Total occlusion of the proximal to mid LAD with left to left and right to left collaterals.  LAD does not wraparound apex.  The first diagonal is large and also fills by collaterals. Irregularities with up to 50% in mid circumflex.  The very distal third obtuse marginal contains segmental 70% stenosis. Left main is widely patent RCA has diffuse 60% mid to  distal narrowing after the first acute marginal ostium. Right heart pressures are normal LVEDP is normal   Recommendations: Consider MRI to rule out apical thrombus versus empirical long-term anticoagulation therapy for presumed LV thrombus with embolic CVA.    EKG:  No EKG today  Recent Labs: 07/26/2021: Magnesium 2.0 07/27/2021: ALT 35 07/29/2021: Hemoglobin 9.8; Platelets 126 08/14/2021: B Natriuretic Peptide 173.8; BUN 26; Creatinine, Ser 1.73; Potassium 3.7; Sodium 140  Recent Lipid Panel    Component Value Date/Time   CHOL 114 09/25/2020 0951   TRIG 39 07/26/2021 0202   HDL 33 (L) 09/25/2020 0951   CHOLHDL 3.5 09/25/2020 0951   CHOLHDL 9.3 08/03/2020 0119   VLDL 27 08/03/2020 0119   LDLCALC 59 09/25/2020 0951   LDLDIRECT 144.0 09/24/2018 1123    Home Medications   Current Meds  Medication Sig   allopurinol (ZYLOPRIM) 100 MG tablet Take 1 tablet (100 mg total) by mouth daily.   apixaban (ELIQUIS) 5 MG TABS tablet Take 1 tablet by mouth twice daily   atorvastatin (LIPITOR) 80 MG tablet Take 1 tablet (80 mg total) by mouth daily.   Cephalexin 250 MG tablet Take 250 mg by mouth daily. Daily for UTI prevention.   Cholecalciferol (VITAMIN D-3 PO) Take 1 capsule by mouth daily.   dapagliflozin propanediol (FARXIGA) 10 MG TABS tablet Take 1 tablet (10 mg total) by mouth daily.   doxycycline (VIBRA-TABS) 100 MG tablet Take 1 tablet (100 mg total) by mouth 2 (two) times daily.   ENTRESTO 49-51 MG Take 1 tablet by mouth twice daily   gabapentin (NEURONTIN) 100 MG capsule Take 100 mg by mouth at bedtime.   glucose blood (ONETOUCH VERIO) test strip USE AS DIRECTED TO  CHECK  BLOOD  SUGAR  FOUR  TIMES  DAILY   insulin regular (NOVOLIN R) 100 units/mL injection Inject 10-14 Units into the skin 3 (three) times daily before meals. Per sliding scale. OTC Novolin R.   mupirocin ointment (BACTROBAN) 2 % Apply 1 Application topically 2 (two) times daily.   pantoprazole (PROTONIX) 40 MG tablet  Take 1 tablet (40 mg total) by mouth daily.   sertraline (ZOLOFT) 50 MG tablet Take 50 mg by mouth daily.   spironolactone (ALDACTONE) 25 MG tablet Take 1/2 tablet (12.5 mg total)  by mouth daily.   torsemide (DEMADEX) 20 MG tablet TAKE ONE TABLET BY MOUTH ON MONDAY, WEDNESDAY, AND FRIDAY. TAKE EXTRA DOSE FOR WEIGHT OVER 160 LBS.   vitamin B-12 (CYANOCOBALAMIN) 1000 MCG tablet Take 1,000 mcg by mouth daily.     Review of Systems      All other systems reviewed and are otherwise negative except as noted above.  Physical Exam    VS:  BP 130/64   Pulse 75   Ht 5' 3.5" (1.613 m)   Wt 174 lb (78.9 kg)   BMI 30.34 kg/m  , BMI Body mass index is 30.34 kg/m.  Wt Readings from Last 3 Encounters:  06/06/22 174 lb (78.9 kg)  05/27/22 174 lb (78.9 kg)  05/19/22 177 lb (80.3 kg)     GEN: Well nourished, well developed, in no acute distress. HEENT: normal. Neck: Supple, no JVD, carotid bruits, or masses. Cardiac: RRR, no murmurs, rubs, or gallops. No clubbing, cyanosis, edema.  Radials/PT 2+ and equal bilaterally.  Respiratory:  Respirations regular and unlabored, clear to auscultation bilaterally. GI: Soft, nontender, nondistended. MS: No deformity or atrophy. Skin: Warm and dry, no rash. Neuro:  Strength and sensation are intact. Psych: Normal affect.  Assessment & Plan    Combined systolic and diastolic heart failure /ischemic cardiomyopathy-  Cardiac MRI 10/2020 with no thrombus and LVEF 38%. Echo 07/21/2021 LVEF 20 to 25%. Echo 11/20/2021 LVEF improved to 70 to 75%. LVEF now recovered. NYHA II. Previous intolerance to Lisinopril, Spironolactone, Losartan, Amlodipine, Metoprolol per patient report. Low-sodium diet, fluid restriction less than 2 L encouraged.  GDMT includes Clifton Custard, Spironolactone, Torsemide. Previously had ringing in her ears on Carvedilol.   CAD - Stable with no anginal symptoms. Present chest pain atypical for angina. No indication for ischemic evaluation.   GDMT includes aspirin, atorvastatin, carvedilol. Heart healthy diet and regular cardiovascular exercise encouraged.    History of CVA / PAF / Hypercoagulable state -Not requiring AV nodal blocking agent. Continue Eliquis 5mg  BID. Does not meet dose reduction criteria.   DM2 - Continue to follow with PCP.   HTN - BP well controlled. Continue current antihypertensive regimen.  Discussed to monitor BP at home at least 2 hours after medications and sitting for 5-10 minutes.   HLD, LDL goal <70 -Continue atorvastatin 80 mg daily.    Disposition: Follow up in 3 months with Heart Failure Clinic & in 6 months with Dr. Duke Salvia  Signed, Alver Sorrow, NP 06/06/2022, 11:28 AM [facility]

## 2022-06-06 NOTE — Patient Instructions (Signed)
Medication Instructions:  Your physician recommends that you continue on your current medications as directed. Please refer to the Current Medication list given to you today.  Follow-Up: At [facility], you and your health needs are our priority.  As part of our continuing mission to provide you with exceptional heart care, we have created designated Provider Care Teams.  These Care Teams include your primary Cardiologist (physician) and Advanced Practice Providers (APPs -  Physician Assistants and Nurse Practitioners) who all work together to provide you with the care you need, when you need it.  We recommend signing up for the patient portal called "MyChart".  Sign up information is provided on this After Visit Summary.  MyChart is used to connect with patients for Virtual Visits (Telemedicine).  Patients are able to view lab/test results, encounter notes, upcoming appointments, etc.  Non-urgent messages can be sent to your provider as well.   To learn more about what you can do with MyChart, go to [email or web address].    Your next appointment:   3 months with Heart Failure Clinic   &    6 months with Dr. Duke Salvia

## 2022-06-07 DIAGNOSIS — G4733 Obstructive sleep apnea (adult) (pediatric): Secondary | ICD-10-CM | POA: Diagnosis not present

## 2022-06-10 ENCOUNTER — Telehealth (HOSPITAL_BASED_OUTPATIENT_CLINIC_OR_DEPARTMENT_OTHER): Payer: Self-pay | Admitting: Family

## 2022-06-10 MED ORDER — DAPAGLIFLOZIN PROPANEDIOL 10 MG PO TABS: 10.0000 mg | ORAL_TABLET | Freq: Every day | ORAL | 3 refills | Status: DC

## 2022-06-10 NOTE — Telephone Encounter (Signed)
Patient niece Yolanda Saunders called about her Yolanda Saunders stating it was going to cost $400 for a month supply even with her insurance.  She stated that once before she was on this medication and there was a program she was on that were we able to help her get it through that.  She wants to know if we can help her get it again on that program.

## 2022-06-10 NOTE — Telephone Encounter (Signed)
Returned call to patient's niece (ok per dpr),   She states that her Yolanda Saunders is very expensive and she was previously enrolled patient assistance. It looks like she was approved 8/23, should be good for 1 year. Advised niece that we will call to follow up on shipment status.   Per farxiga, the patient's has prescription has expired and will need a new one sent over. The best fax number [phone]. Per farxiga allow 2-5 days for processing time.   Returned call to the niece and provided an update. She states she is not completely out at this time, advised patient to call if she runs out before the shipment comes and we can get her some samples. Niece verbalizes understanding and is very Adult nurse.

## 2022-06-26 ENCOUNTER — Other Ambulatory Visit: Payer: Self-pay | Admitting: Podiatry

## 2022-06-26 ENCOUNTER — Other Ambulatory Visit (HOSPITAL_COMMUNITY): Payer: Self-pay | Admitting: Cardiovascular Disease

## 2022-06-26 ENCOUNTER — Telehealth (HOSPITAL_BASED_OUTPATIENT_CLINIC_OR_DEPARTMENT_OTHER): Payer: Self-pay | Admitting: Cardiovascular Disease

## 2022-06-26 MED ORDER — TORSEMIDE 20 MG PO TABS: ORAL_TABLET | ORAL | 0 refills | Status: DC

## 2022-06-26 NOTE — Telephone Encounter (Signed)
*  STAT* If patient is at the pharmacy, call can be transferred to refill team.   1. Which medications need to be refilled? (please list name of each medication and dose if known)   torsemide (DEMADEX) 20 MG tablet   2. Which pharmacy/location (including street and city if local pharmacy) is medication to be sent to?  [id number] [place] [address] [place] - [address]   3. Do they need a 30 day or 90 day supply?   30 day  Niece states patient was only give 3 tablets for refill and only has a few days left of this medication.

## 2022-06-26 NOTE — Telephone Encounter (Signed)
Rx request sent to pharmacy.  

## 2022-07-15 DIAGNOSIS — E1121 Type 2 diabetes mellitus with diabetic nephropathy: Secondary | ICD-10-CM | POA: Diagnosis not present

## 2022-07-15 DIAGNOSIS — G44209 Tension-type headache, unspecified, not intractable: Secondary | ICD-10-CM | POA: Diagnosis not present

## 2022-07-15 DIAGNOSIS — L03039 Cellulitis of unspecified toe: Secondary | ICD-10-CM | POA: Diagnosis not present

## 2022-07-21 ENCOUNTER — Other Ambulatory Visit: Payer: Self-pay | Admitting: Podiatry

## 2022-07-25 ENCOUNTER — Other Ambulatory Visit (HOSPITAL_BASED_OUTPATIENT_CLINIC_OR_DEPARTMENT_OTHER): Payer: Self-pay | Admitting: Cardiovascular Disease

## 2022-08-19 ENCOUNTER — Ambulatory Visit: Payer: Medicare Other | Admitting: Adult Health

## 2022-08-19 ENCOUNTER — Encounter: Payer: Self-pay | Admitting: Adult Health

## 2022-08-19 VITALS — BP 110/62 | HR 72 | Ht 63.5 in | Wt 179.0 lb

## 2022-08-19 DIAGNOSIS — G4733 Obstructive sleep apnea (adult) (pediatric): Secondary | ICD-10-CM

## 2022-08-19 NOTE — Patient Instructions (Addendum)
Set up CPAP titration study.  Try to wear CPAP all night long .   Work on healthy weight  Use caution with sedating medications.  Do not drive if sleepy  Follow up in 3 months with Dr. Wynona Neat or Gregery Walberg NP

## 2022-08-19 NOTE — Assessment & Plan Note (Signed)
Moderate obstructive sleep apnea-patient has significant comorbidities including congestive heart failure/cardiomyopathy, A-fib, previous stroke.  She definitely needs to continue on treatment for underlying sleep apnea.  Will set patient up for a CPAP titration study.  As download shows she has several residual events.  Also during her titration study can work on mask fitting as well. Do not think that she would be a great candidate for the inspire device as she is memory issues remain a ongoing issue and patient lives alone  Plan  Patient Instructions  Set up CPAP titration study.  Try to wear CPAP all night long .   Work on healthy weight  Use caution with sedating medications.  Do not drive if sleepy  Follow up in 3 months with Dr. Wynona Neat or Cranford Blessinger NP

## 2022-08-19 NOTE — Progress Notes (Signed)
@Patient  ID: Yolanda Saunders, female    DOB: 02-19-1945, 77 y.o.   MRN: [id number]  Chief Complaint  Patient presents with   Follow-up    Referring provider: Noberto Retort, MD  HPI: 77 year old female seen for pulmonary consult during hospitalization for critical illness with acute hypoxic respiratory failure due to decompensated heart failure July 2023.  Found to have moderate obstructive sleep apnea Patient is legally blind Medical history significant for stroke, congestive heart failure,/cardiomyopathy A-fib  TEST/EVENTS :  NPSG October 03, 2021 and showed moderate sleep apnea with AHI 25.6/hour and SPO2 low at 70%.   08/19/2022 Follow up : OSA  Patient returns for a 85-month follow-up.  Patient has underlying moderate obstructive sleep apnea.  Patient says she still has difficulty tolerating her CPAP.  She has tried several different mask but says they are all very uncomfortable.  Also has difficulty with intermittent memory issues due to her previous stroke.  Says that sometimes she forgets to put it on sometimes she takes it off in her sleep.  CPAP download shows 90% compliance with daily average usage at 5 hours.  Patient is on AutoSet 5 to 15 cm H2O.  AHI 12.2 positive mask leaks.  Patient says she wants to be successful with CPAP but just feels like it is very uncomfortable. She does feel tired with low energy during the daytime.  Does get sleepy intermittently.   Allergies  Allergen Reactions   Codeine Other (See Comments)    "makes me crazy; see things; delusional"   Erythromycin Rash and Other (See Comments)    Peeling skin Skin turned "purplish"    Penicillins Rash and Other (See Comments)    Skin blisters   Shellfish Allergy Anaphylaxis and Swelling    Throat swells   Sulfonamide Derivatives Hives   Zestril [Lisinopril] Cough   Toprol Xl [Metoprolol] Diarrhea and Nausea And Vomiting    Rapid weight gain Hallucinations    Actos [Pioglitazone] Other (See  Comments)    Upset GI   Aldactone [Spironolactone] Other (See Comments)    Unknown reaction   Amaryl [Glimepiride] Other (See Comments)    Upset GI   Apresoline [Hydralazine] Other (See Comments)    Unknown reaction   Benicar [Olmesartan] Other (See Comments)    Dizziness Headaches   Byetta 10 Mcg Pen [Exenatide] Nausea And Vomiting   Catapres [Clonidine Hcl] Other (See Comments)    Unknown reaction   Flexeril [Cyclobenzaprine] Other (See Comments)    Drowsiness    Glucotrol [Glipizide] Other (See Comments)    Upset GI   Glyburide-Metformin Other (See Comments)    Myalgias   Humalog [Insulin Lispro] Other (See Comments)    Headache Severe hyperglycemia   Invokana [Canagliflozin] Other (See Comments)    Yeast infections   Januvia [Sitagliptin] Other (See Comments)    UTI   Neurontin [Gabapentin] Other (See Comments)    Anxiety    Norvasc [Amlodipine] Other (See Comments)    Syncope   Prednisone Other (See Comments)    Unknown reaction   Propoxyphene Other (See Comments)    Unknown reaction Darvocet-N   Reglan [Metoclopramide] Other (See Comments)    Unknown reaction   Septra [Sulfamethoxazole-Trimethoprim] Hives   Trimethoprim    Ultram [Tramadol] Other (See Comments)    Anxiety    Victoza [Liraglutide] Diarrhea    Upset GI   Zocor [Simvastatin] Other (See Comments)    Myalgias    Cozaar [Losartan Potassium] Rash    Upset  GI   Novolin 70-30 [Insulin Nph Isophane & Regular] Swelling and Rash   Sulfa Antibiotics Hives and Rash    Immunization History  Administered Date(s) Administered   Influenza, High Dose Seasonal PF 11/01/2015, 11/27/2016   Influenza-Unspecified 09/21/2010   Pneumococcal-Unspecified 10/21/2010    Past Medical History:  Diagnosis Date   Anemia 04/17/11   "long, long years ago"   Angina 04/16/11   "that's what I'm here for"   Anxiety    Arthritis    Hips and knees   Asthma    CAD in native artery 08/07/2021   Carpal tunnel syndrome     Cataracts, bilateral    Chronic combined systolic and diastolic heart failure (HCC) 08/07/2020   CKD (chronic kidney disease), stage III (HCC)    Complication of anesthesia 1987   "affected my eyes; couldn't see anything but blurrs even the next day"   CVA (cerebral infarction)    After cardiac catheter 02/2000   Depression    Edema    Fibromyalgia    GERD (gastroesophageal reflux disease)    Headache(784.0)    High cholesterol    History of bronchitis    Hypertension    IBS (irritable bowel syndrome)    Migraines    PAF (paroxysmal atrial fibrillation) (HCC) 08/07/2021   Panic attacks 04/17/11   "don't take anything for it"   Pneumonia 04/17/11   "probably as many as 7 times"   Renal disorder 04/17/11   "they are working at 60% capacity"   Shortness of breath on exertion    "cause of my asthma"   Stroke (HCC) 2002   residual "problem w/using the right word, left 5 lesions on my brain/MRI; long term memory loss"   Type II diabetes mellitus (HCC)     Tobacco History: Social History   Tobacco Use  Smoking Status Former   Current packs/day: 0.00   Average packs/day: 0.8 packs/day for 6.0 years (4.5 ttl pk-yrs)   Types: Cigarettes   Start date: 01/20/1974   Quit date: 01/21/1980   Years since quitting: 42.6  Smokeless Tobacco Never   Counseling given: Not Answered   Outpatient Medications Prior to Visit  Medication Sig Dispense Refill   allopurinol (ZYLOPRIM) 100 MG tablet Take 1 tablet by mouth once daily 30 tablet 0   apixaban (ELIQUIS) 5 MG TABS tablet Take 1 tablet by mouth twice daily 60 tablet 5   atorvastatin (LIPITOR) 80 MG tablet Take 1 tablet (80 mg total) by mouth daily. 30 tablet 0   Cephalexin 250 MG tablet Take 250 mg by mouth daily. Daily for UTI prevention.     Cholecalciferol (VITAMIN D-3 PO) Take 1 capsule by mouth daily.     dapagliflozin propanediol (FARXIGA) 10 MG TABS tablet Take 1 tablet (10 mg total) by mouth daily. 90 tablet 3   doxycycline  (VIBRA-TABS) 100 MG tablet Take 1 tablet (100 mg total) by mouth 2 (two) times daily. 20 tablet 0   gabapentin (NEURONTIN) 100 MG capsule Take 100 mg by mouth at bedtime.     glucose blood (ONETOUCH VERIO) test strip USE AS DIRECTED TO  CHECK  BLOOD  SUGAR  FOUR  TIMES  DAILY 400 each 3   insulin regular (NOVOLIN R) 100 units/mL injection Inject 10-14 Units into the skin 3 (three) times daily before meals. Per sliding scale. OTC Novolin R.     mupirocin ointment (BACTROBAN) 2 % Apply 1 Application topically 2 (two) times daily. 30 g 2  pantoprazole (PROTONIX) 40 MG tablet Take 1 tablet (40 mg total) by mouth daily. 30 tablet 0   sacubitril-valsartan (ENTRESTO) 49-51 MG Take 1 tablet by mouth twice daily 60 tablet 6   sertraline (ZOLOFT) 50 MG tablet Take 50 mg by mouth daily.     spironolactone (ALDACTONE) 25 MG tablet Take 1/2 tablet (12.5 mg total) by mouth daily. 30 tablet 1   torsemide (DEMADEX) 20 MG tablet TAKE 1 TABLET BY MOUTH ONCE DAILY ON MONDAY, WEDNESDAY AND FRIDAY. TAKE AN EXTRA DOSE FOR WEIGHT OVER 160 LBS 30 tablet 0   vitamin B-12 (CYANOCOBALAMIN) 1000 MCG tablet Take 1,000 mcg by mouth daily.     No facility-administered medications prior to visit.     Review of Systems:   Constitutional:   No  weight loss, night sweats,  Fevers, chills,  +fatigue, or  lassitude. +vision loss   HEENT:   No headaches,  Difficulty swallowing,  Tooth/dental problems, or  Sore throat,                No sneezing, itching, ear ache, nasal congestion, post nasal drip,   CV:  No chest pain,  Orthopnea, PND, swelling in lower extremities, anasarca, dizziness, palpitations, syncope.   GI  No heartburn, indigestion, abdominal pain, nausea, vomiting, diarrhea, change in bowel habits, loss of appetite, bloody stools.   Resp: No shortness of breath with exertion or at rest.  No excess mucus, no productive cough,  No non-productive cough,  No coughing up of blood.  No change in color of mucus.  No  wheezing.  No chest wall deformity  Skin: no rash or lesions.  GU: no dysuria, change in color of urine, no urgency or frequency.  No flank pain, no hematuria   MS:  No joint pain or swelling.  No decreased range of motion.  No back pain.    Physical Exam  BP 110/62   Pulse 72   Ht 5' 3.5" (1.613 m)   Wt 179 lb (81.2 kg)   SpO2 100%   BMI 31.21 kg/m   GEN: A/Ox3; pleasant , NAD, well nourished    HEENT:  [place]/AT,  NOSE-clear, THROAT-clear, no lesions, no postnasal drip or exudate noted.   NECK:  Supple w/ fair ROM; no JVD; normal carotid impulses w/o bruits; no thyromegaly or nodules palpated; no lymphadenopathy.    RESP  Clear  P & A; w/o, wheezes/ rales/ or rhonchi. no accessory muscle use, no dullness to percussion  CARD:  RRR, no m/r/g, no peripheral edema, pulses intact, no cyanosis or clubbing.  GI:   Soft & nt; nml bowel sounds; no organomegaly or masses detected.   Musco: Warm bil, no deformities or joint swelling noted.   Neuro: alert, no focal deficits noted.    Skin: Warm, no lesions or rashes    Lab Results:    BMET     Imaging: No results found.  Administration History     None           No data to display          No results found for: "NITRICOXIDE"      Assessment & Plan:   OSA (obstructive sleep apnea) Moderate obstructive sleep apnea-patient has significant comorbidities including congestive heart failure/cardiomyopathy, A-fib, previous stroke.  She definitely needs to continue on treatment for underlying sleep apnea.  Will set patient up for a CPAP titration study.  As download shows she has several residual events.  Also during her  titration study can work on mask fitting as well. Do not think that she would be a great candidate for the inspire device as she is memory issues remain a ongoing issue and patient lives alone  Plan  Patient Instructions  Set up CPAP titration study.  Try to wear CPAP all night long .   Work  on healthy weight  Use caution with sedating medications.  Do not drive if sleepy  Follow up in 3 months with Dr. Wynona Neat or Sangita Zani NP       Rubye Oaks, NP 08/19/2022

## 2022-08-31 ENCOUNTER — Other Ambulatory Visit (HOSPITAL_BASED_OUTPATIENT_CLINIC_OR_DEPARTMENT_OTHER): Payer: Self-pay | Admitting: Cardiovascular Disease

## 2022-09-01 NOTE — Telephone Encounter (Signed)
Rx request sent to pharmacy.  

## 2022-09-09 ENCOUNTER — Telehealth (HOSPITAL_COMMUNITY): Payer: Self-pay

## 2022-09-09 NOTE — Telephone Encounter (Signed)
Called to confirm/remind patient of their appointment at [facility] on 09/09/22.   Patient reminded to bring all medications and/or complete list.  Confirmed patient has transportation. Gave directions, instructed to utilize valet parking.  Confirmed appointment prior to ending call.

## 2022-09-10 ENCOUNTER — Encounter (HOSPITAL_COMMUNITY): Payer: Self-pay

## 2022-09-10 ENCOUNTER — Other Ambulatory Visit (HOSPITAL_COMMUNITY): Payer: Self-pay | Admitting: Internal Medicine

## 2022-09-10 ENCOUNTER — Ambulatory Visit (HOSPITAL_COMMUNITY)
Admission: RE | Admit: 2022-09-10 | Discharge: 2022-09-10 | Disposition: A | Discharge status: Home / Self Care | Payer: Medicare Other | Source: Ambulatory Visit | Attending: Adult Health | Admitting: Adult Health

## 2022-09-10 ENCOUNTER — Inpatient Hospital Stay (HOSPITAL_COMMUNITY): Admission: RE | Admit: 2022-09-10 | Payer: Medicare Other | Source: Ambulatory Visit

## 2022-09-10 ENCOUNTER — Other Ambulatory Visit (HOSPITAL_COMMUNITY): Payer: Self-pay

## 2022-09-10 VITALS — BP 104/60 | HR 72 | Wt 179.6 lb

## 2022-09-10 DIAGNOSIS — Z8673 Personal history of transient ischemic attack (TIA), and cerebral infarction without residual deficits: Secondary | ICD-10-CM | POA: Diagnosis not present

## 2022-09-10 DIAGNOSIS — Z79899 Other long term (current) drug therapy: Secondary | ICD-10-CM | POA: Insufficient documentation

## 2022-09-10 DIAGNOSIS — Z602 Problems related to living alone: Secondary | ICD-10-CM | POA: Diagnosis not present

## 2022-09-10 DIAGNOSIS — I083 Combined rheumatic disorders of mitral, aortic and tricuspid valves: Secondary | ICD-10-CM | POA: Insufficient documentation

## 2022-09-10 DIAGNOSIS — R002 Palpitations: Secondary | ICD-10-CM

## 2022-09-10 DIAGNOSIS — I2510 Atherosclerotic heart disease of native coronary artery without angina pectoris: Secondary | ICD-10-CM | POA: Diagnosis not present

## 2022-09-10 DIAGNOSIS — M4800 Spinal stenosis, site unspecified: Secondary | ICD-10-CM | POA: Insufficient documentation

## 2022-09-10 DIAGNOSIS — N1831 Chronic kidney disease, stage 3a: Secondary | ICD-10-CM

## 2022-09-10 DIAGNOSIS — E1122 Type 2 diabetes mellitus with diabetic chronic kidney disease: Secondary | ICD-10-CM | POA: Insufficient documentation

## 2022-09-10 DIAGNOSIS — I480 Paroxysmal atrial fibrillation: Secondary | ICD-10-CM | POA: Insufficient documentation

## 2022-09-10 DIAGNOSIS — E1141 Type 2 diabetes mellitus with diabetic mononeuropathy: Secondary | ICD-10-CM | POA: Diagnosis not present

## 2022-09-10 DIAGNOSIS — I428 Other cardiomyopathies: Secondary | ICD-10-CM | POA: Diagnosis not present

## 2022-09-10 DIAGNOSIS — Z7901 Long term (current) use of anticoagulants: Secondary | ICD-10-CM | POA: Insufficient documentation

## 2022-09-10 DIAGNOSIS — G5603 Carpal tunnel syndrome, bilateral upper limbs: Secondary | ICD-10-CM | POA: Insufficient documentation

## 2022-09-10 DIAGNOSIS — I5042 Chronic combined systolic (congestive) and diastolic (congestive) heart failure: Secondary | ICD-10-CM | POA: Diagnosis not present

## 2022-09-10 DIAGNOSIS — N183 Chronic kidney disease, stage 3 unspecified: Secondary | ICD-10-CM | POA: Diagnosis not present

## 2022-09-10 DIAGNOSIS — I130 Hypertensive heart and chronic kidney disease with heart failure and stage 1 through stage 4 chronic kidney disease, or unspecified chronic kidney disease: Secondary | ICD-10-CM | POA: Insufficient documentation

## 2022-09-10 DIAGNOSIS — I6621 Occlusion and stenosis of right posterior cerebral artery: Secondary | ICD-10-CM | POA: Insufficient documentation

## 2022-09-10 DIAGNOSIS — E1136 Type 2 diabetes mellitus with diabetic cataract: Secondary | ICD-10-CM | POA: Diagnosis not present

## 2022-09-10 LAB — BASIC METABOLIC PANEL
Anion gap: 15 (ref 5–15)
BUN: 34 mg/dL — ABNORMAL HIGH (ref 8–23)
CO2: 24 mmol/L (ref 22–32)
Calcium: 9.8 mg/dL (ref 8.9–10.3)
Chloride: 101 mmol/L (ref 98–111)
Creatinine, Ser: 1.74 mg/dL — ABNORMAL HIGH (ref 0.44–1.00)
GFR, Estimated: 29 mL/min — ABNORMAL LOW (ref >60)
Glucose, Bld: 189 mg/dL — ABNORMAL HIGH (ref 70–99)
Potassium: 4.5 mmol/L (ref 3.5–5.1)
Sodium: 140 mmol/L (ref 135–145)

## 2022-09-10 NOTE — Patient Instructions (Addendum)
Thank you for coming in today  If you had labs drawn today, any labs that are abnormal the clinic will call you No news is good news  Your provider has recommended that  you wear a Zio Patch for 7 days.  This monitor will record your heart rhythm for our review.  IF you have any symptoms while wearing the monitor please press the button.  If you have any issues with the patch or you notice a red or orange light on it please call the company at [phone].  Once you remove the patch please mail it back to the company as soon as possible so we can get the results.   Medications: No changes  Follow up appointments:  Your physician recommends that you schedule a follow-up appointment in:  6 months With Dr. Gala Romney You will receive a reminder letter in the mail a few months in advance. If you don't receive a letter, please call our office to schedule the follow-up appointment.    Do the following things EVERYDAY: Weigh yourself in the morning before breakfast. Write it down and keep it in a log. Take your medicines as prescribed Eat low salt foods--Limit salt (sodium) to 2000 mg per day.  Stay as active as you can everyday Limit all fluids for the day to less than 2 liters   At the [facility], you and your health needs are our priority. As part of our continuing mission to provide you with exceptional heart care, we have created designated Provider Care Teams. These Care Teams include your primary Cardiologist (physician) and Advanced Practice Providers (APPs- Physician Assistants and Nurse Practitioners) who all work together to provide you with the care you need, when you need it.   You may see any of the following providers on your designated Care Team at your next follow up: Dr Arvilla Meres Dr Marca Ancona Dr. Marcos Eke, NP Robbie Lis, [place] [facility] [place], [place] Brynda Peon, NP Karle Plumber, PharmD   Please be sure  to bring in all your medications bottles to every appointment.    Thank you for choosing [facility]  If you have any questions or concerns before your next appointment please send [place] a message through [employer] or call our office at [phone].    TO LEAVE A MESSAGE FOR THE NURSE SELECT OPTION 2, PLEASE LEAVE A MESSAGE INCLUDING: YOUR NAME DATE OF BIRTH CALL BACK NUMBER REASON FOR CALL**this is important as we prioritize the call backs  YOU WILL RECEIVE A CALL BACK THE SAME DAY AS LONG AS YOU CALL BEFORE 4:00 PM

## 2022-09-10 NOTE — Progress Notes (Signed)
[facility]    PCP: Dr Tiburcio Pea  Primary Cardiologist: Dr Duke Salvia HF MD: Dr Duke Salvia   HPI: [name] is a 77 y/o female w/ chronic combined systolic and diastolic heart failure, CAD, Stage III CKD, Type 2DM, HTN, HLD, h/o stroke and bradycardia. Also w/ history of bilateral carpal tunnel s/p b/l carpal tunnel release and known spinal stenosis.    She was admitted last year, 07/2020, with stroke.  She had a large right PCA infarct.  She was found to have 75% stenosis of the right PCA with severe distal left P2 stenosis.  MRI of the brain showed moderate acute right PCA infarct and underlying chronic microvascular ischemic disease.  She had some remote lacunar infarcts in the basal ganglia and thalamus.  Echo that admission revealed LVEF 35 to 40%.  She also had COVID-19 and was treated with remdesivir.  She underwent left heart cath which revealed 100% occlusion of the mid LAD and D2.  She had left to left and right to left collaterals.  She also had 50% stenosis in the mid LAD and 70% distal OM 3 stenoses.  She had 60% stenosis in the RCA.  There was concern for apical thrombus as the source of her embolic stroke.  She was discharged with a 30-day monitor.  Outpatient cardiac MRI was recommended to rule out LV thrombus.  Her 30-day monitor did reveal atrial fibrillation.  She was started on Eliquis.  Cardiac MRI 10/2020 revealed LVEF 38% with LGE consistent with prior infarction. There was no LV thrombus.  There was hypokinesis of the mid anterior, anteroseptal, inferoseptal, and inferior myocardium.  There was apical akinesis. Not noted evidence of infiltrative CM in report.    She was recently admitted 07/2021 w/ a/c systolic CHF and hypoxic respiratory failure. She initially required BiPAP and subsequently intubation. COVID negative. Denied CP. Hs trop 68>>65. D-dimer normal. BNP 450. CXR w/ pulmonary edema. She was in SR. Echo revealed further drop in  LVEF 20 to 25% and  global hypokinesis. She had grade 2 diastolic dysfunction and mildly reduced right ventricular function. She was diuresed w/ IV Lasix and later extubated.  She was started on Entresto and spironolactone.  Her carvedilol was held due to low heart rates.     Had post hospital f/u w/ Dr. Duke Salvia last week. BP was moderately elevated. HR was ok. Not bradycardic. Wt was down 6 lb from d/c wt. She was restarted on low dose ? blocker, Coreg 3.125. BMP showed slight bump in SCr from 1.4>>1.7. Plan was for repeat echo in 3 months to reassess LVEF. If no improvement w/ medical therapy, then would consider relook LHC and possible ICD placement.    [facility]. BB stopped due to fatigue/bradycardia, entresto increased, and lasix was switched to as needed. She took  tosemide 5 mg 8/4 and 8/5 and 10 mg on 8/7, 8/8, 8/9.    Saw Dr Gala Romney in the HF clinic 08/2021 Started on torsemide 20 mg M-W-F.   Today she returns for HF follow up with her family. Complaining of palpitations a few times a day.  Says it doesn't last long.  Having some right knee pain. More active at the [employer]  Denies SOB/PND/Orthopnea. Appetite ok. No fever or chills. Weight at home stable. Taking all medications. Living in independent living at [facility].  Her niece fixes her pill box.    Cardiac  Studies   Echo 11/2021 LV 70-75% RV normal.   2D Echo 07/25/21 There is mild basilar sparing of left ventricular contractile performance. No thrombus (Definity contrast). Left ventricular ejection fraction, by estimation, is 20 to 25%. The left ventricle has severely decreased function. The left ventricle demonstrates global hypokinesis. The left ventricular internal cavity size was mildly dilated. Left ventricular diastolic parameters are consistent with Grade II diastolic dysfunction (pseudonormalization). 1. Right ventricular systolic function is mildly reduced. The right  ventricular size is normal. There is normal pulmonary artery systolic pressure. 2. 3. Left atrial size was severely dilated. A small pericardial effusion is present. The pericardial effusion is anterior to the right ventricle. There is no evidence of cardiac tamponade. 4. The mitral valve is normal in structure. Mild mitral valve regurgitation. No evidence of mitral stenosis. 5. 6. Tricuspid valve regurgitation is moderate. The aortic valve is normal in structure. Aortic valve regurgitation is mild. No aortic stenosis is present. 7. The inferior vena cava is normal in size with greater than 50% respiratory variability, suggesting right atrial pressure of 3 mmHg.   LHC 07/2020 RA 4 PA 33/15 (22) , PCWP 13, CO 7.2 CI 3.4    Mid LAD lesion is 100% stenosed.   2nd Diag lesion is 100% stenosed.   Total occlusion of the proximal to mid LAD with left to left and right to left collaterals.  LAD does not wraparound apex.  The first diagonal is large and also fills by collaterals. Irregularities with up to 50% in mid circumflex.  The very distal third obtuse marginal contains segmental 70% stenosis. Left main is widely patent RCA has diffuse 60% mid to distal narrowing after the first acute marginal ostium. Right heart pressures are normal LVEDP is normal   Diagnostic Dominance: Right  Intervention         Cardiac MRI 10/2020 FINDINGS: 1. Mildly increased left ventricular size, with LVEDD 52 mm, but LVEDVi 95 mL/m2.   Normal left ventricular thickness, with intraventricular septal thickness of 8 mm, posterior wall thickness of 6 mm, and myocardial mass index of 51 g/m2.   Moderately reduced left ventricular systolic function (LVEF = 38%). There are regional wall motion abnormalities: There is hypokinesis of the mid anterior, anteroseptal, inferoseptal, and inferior segments. There is akinesis of the apex and all apical segments.   Left ventricular parametric mapping notable  for normal T2 signal mild increase in the mid LV segments (30-40%) and significant increase in the apex (46-54%) .   There is late gadolinium enhancement in the left ventricular myocardium: There is apical inferior 75 % LGE suggestive of infarction. There is apical anterior septum and anterior remodeling and thinning.   No LV thrombus noted.   2. Normal right ventricular size with RVEDVI 65 mL/m2.   Normal right ventricular thickness.   Normal right ventricular systolic function (RVEF =56%). There are no regional wall motion abnormalities or aneurysms.   3.  Normal left and right atrial size.   4. Normal size of the aortic root, ascending aorta and pulmonary artery.   5.  Qualitatively no significant valvular abnormalities.   6.  Normal pericardium.  No pericardial effusion.   7. Grossly, no extracardiac findings. Recommended dedicated study if concerned for non-cardiac pathology.   IMPRESSION: Ischemic Cardiomyopathy and evidence of LAD infarct. No LV thrombus noted.      ROS: All systems negative except as listed in HPI, PMH and Problem List.  SH:  Social History   Socioeconomic History  Marital status: Widowed    Spouse name: Not on file   Number of children: 2   Years of education: Not on file   Highest education level: Not on file  Occupational History   Occupation: retired  Tobacco Use   Smoking status: Former    Current packs/day: 0.00    Average packs/day: 0.8 packs/day for 6.0 years (4.5 ttl pk-yrs)    Types: Cigarettes    Start date: 01/20/1974    Quit date: 01/21/1980    Years since quitting: 42.6   Smokeless tobacco: Never  Vaping Use   Vaping status: Not on file  Substance and Sexual Activity   Alcohol use: No    Alcohol/week: 0.0 standard drinks of alcohol   Drug use: No   Sexual activity: Not Currently  Other Topics Concern   Not on file  Social History Narrative   Lives with husband in a one story home.  Has 2 children.  Retired from  [employer].  Education: 12th grade.  Trade schools.    Social Determinants of Health   Financial Resource Strain: Low Risk  (07/29/2021)   Overall Financial Resource Strain (CARDIA)    Difficulty of Paying Living Expenses: Not hard at all  Food Insecurity: No Food Insecurity (07/29/2021)   Hunger Vital Sign    Worried About Running Out of Food in the Last Year: Never true    Ran Out of Food in the Last Year: Never true  Transportation Needs: No Transportation Needs (07/29/2021)   PRAPARE - Administrator, Civil Service (Medical): No    Lack of Transportation (Non-Medical): No  Physical Activity: Not on file  Stress: Not on file  Social Connections: Not on file  Intimate Partner Violence: Not on file    FH:  Family History  Problem Relation Age of Onset   Coronary artery disease Father    Diabetes Mellitus I Father    CVA Mother    Stroke Mother    Diabetes Mellitus I Mother    Diabetes Mellitus I Sister    Diabetes Mellitus I Brother    Diabetes Mellitus I Maternal Grandmother    Diabetes Mellitus I Maternal Grandfather    Diabetes Mellitus I Paternal Grandmother    Diabetes Mellitus I Paternal Grandfather    Diabetes Mellitus I Brother    Aortic aneurysm Son     Past Medical History:  Diagnosis Date   Anemia 04/17/11   "long, long years ago"   Angina 04/16/11   "that's what I'm here for"   Anxiety    Arthritis    Hips and knees   Asthma    CAD in native artery 08/07/2021   Carpal tunnel syndrome    Cataracts, bilateral    Chronic combined systolic and diastolic heart failure (HCC) 08/07/2020   CKD (chronic kidney disease), stage III (HCC)    Complication of anesthesia 1987   "affected my eyes; couldn't see anything but blurrs even the next day"   CVA (cerebral infarction)    After cardiac catheter 02/2000   Depression    Edema    Fibromyalgia    GERD (gastroesophageal reflux disease)    Headache(784.0)    High cholesterol    History of  bronchitis    Hypertension    IBS (irritable bowel syndrome)    Migraines    PAF (paroxysmal atrial fibrillation) (HCC) 08/07/2021   Panic attacks 04/17/11   "don't take anything for it"   Pneumonia 04/17/11   "  probably as many as 7 times"   Renal disorder 04/17/11   "they are working at 60% capacity"   Shortness of breath on exertion    "cause of my asthma"   Stroke [facility]) 2002   residual "problem w/using the right word, left 5 lesions on my brain/MRI; long term memory loss"   Type II diabetes mellitus (HCC)     Current Outpatient Medications  Medication Sig Dispense Refill   allopurinol (ZYLOPRIM) 100 MG tablet Take 1 tablet by mouth once daily 30 tablet 0   apixaban (ELIQUIS) 5 MG TABS tablet Take 1 tablet by mouth twice daily 60 tablet 5   atorvastatin (LIPITOR) 80 MG tablet Take 1 tablet (80 mg total) by mouth daily. 30 tablet 0   Cephalexin 250 MG tablet Take 250 mg by mouth daily. Daily for UTI prevention.     Cholecalciferol (VITAMIN D-3 PO) Take 1 capsule by mouth daily.     dapagliflozin propanediol (FARXIGA) 10 MG TABS tablet Take 1 tablet (10 mg total) by mouth daily. 90 tablet 3   gabapentin (NEURONTIN) 100 MG capsule Take 100 mg by mouth at bedtime.     glucose blood (ONETOUCH VERIO) test strip USE AS DIRECTED TO  CHECK  BLOOD  SUGAR  FOUR  TIMES  DAILY 400 each 3   insulin regular (NOVOLIN R) 100 units/mL injection Inject 10-14 Units into the skin 3 (three) times daily before meals. Per sliding scale. OTC Novolin R.     pantoprazole (PROTONIX) 40 MG tablet Take 1 tablet (40 mg total) by mouth daily. 30 tablet 0   sacubitril-valsartan (ENTRESTO) 49-51 MG Take 1 tablet by mouth twice daily 60 tablet 6   sertraline (ZOLOFT) 50 MG tablet Take 50 mg by mouth daily.     spironolactone (ALDACTONE) 25 MG tablet Take 1/2 tablet (12.5 mg total) by mouth daily. 30 tablet 1   torsemide (DEMADEX) 20 MG tablet TAKE 1 TABLET BY MOUTH ON MONDAY, WEDNESDAY AND FRIDAY. TAKE AN EXTRA DOSE  FOR WEIGHT OVER 160 LBS 90 tablet 0   vitamin B-12 (CYANOCOBALAMIN) 1000 MCG tablet Take 1,000 mcg by mouth daily.     No current facility-administered medications for this encounter.    Vitals:   09/10/22 1153  BP: 104/60  Pulse: 72  SpO2: 97%  Weight: 81.5 kg (179 lb 9.6 oz)    Wt Readings from Last 3 Encounters:  09/10/22 81.5 kg (179 lb 9.6 oz)  08/19/22 81.2 kg (179 lb)  06/06/22 78.9 kg (174 lb)     PHYSICAL EXAM: General:  Well appearing. No resp difficulty. Walked in the clinic.  HEENT: normal Neck: supple. no JVD. Carotids 2+ bilat; no bruits. No lymphadenopathy or thryomegaly appreciated. Cor: PMI nondisplaced. Regular rate & rhythm. No rubs, gallops or murmurs. Lungs: clear Abdomen: soft, nontender, nondistended. No hepatosplenomegaly. No bruits or masses. Good bowel sounds. Extremities: no cyanosis, clubbing, rash, edema Neuro: alert & orientedx3, cranial nerves grossly intact. moves all 4 extremities w/o difficulty. Affect pleasant  EKG: SR 75 bpm personally checked.   ASSESSMENT & PLAN:  1. Chronic Systolic Heart Failure - Mainly ICM but ? Component of NICM  - Echo 07/2020 at the time of large CVA revealed LVEF 35 to 40%. She was also COVID + ? Component of stressed induced CM  - LHC 7/22 w/ multivessel CAD including total occlusion of the proximal to mid LAD with left to left and right to left collaterals. First diagonal is large and also fills by  collaterals, 60% m-dRCA disease, treated medically  - cMRI 10/22 most c/w ICM. While she does bilateral carpal tunnel syndrome and spinal stenosis (possible extracardiac manifestations of amyloid), her cMRI was not suggestive of infiltrative process  - Echo 07/2021 w/ further drop in LVEF, down to 20-25%, RV mildly reduced. No recent CP. Hs trop during admission mildly elevated but level and trend not c/w ACS _Echo 11/2021 EF ~ 70%-75%. RV normal. Discussed results.  - NYHA I. Volume status stable. Continue torsemide  20 mg M-W-F.  -Off bb due to fatigue and  bradycardia - Continue Entresto to 49-51 mg bid  - Continue Spironolactone 12.5 mg daily  - Continue farxiga 10 mg daily  - Check BMET    2. CAD - Cath 7/22>>100% occlusion of the mid LAD and D2.  She had left to left and right to left collaterals.  She also had 50% stenosis in the mid LAD and 70% distal OM 3 stenoses.  She had 60% stenosis in the RCA.CMRI with extensive scar.  - treating medically. Cath not felt to add anything.  - No chest pain.  - continue statin - no ASA w/ Eliqus use - Off BB due to fatigue and bradycardia    3. PAF -EKG today- SR.  -  continue Eliquis. Denies abnormal bleeding    4. Stage III CKD  - B/l SCr ~1.5 - On farxiga 10 mg daily  - Check BMET    5. H/o CVA - 2022, cardioembolic in setting of PAF and reduced LVEF w/ ? LV thrombus  - continue Eliquis indefinitely for secondary prevention   6. Palpitations  Place 7 day Zio patch.   Asked pharmacy to work on [employer] patient assistance  Check BMET.     Follow up in 6 months Dr Gala Romney. If stable at that time can graduate to only Dr Duke Salvia.       Tonye Becket NP-C  11:57 AM'

## 2022-09-10 NOTE — Progress Notes (Signed)
Zio patch placed onto patient.  All instructions and information reviewed with patient, they verbalize understanding with no questions. 

## 2022-09-18 ENCOUNTER — Telehealth: Payer: Self-pay | Admitting: Urology

## 2022-09-18 ENCOUNTER — Other Ambulatory Visit: Payer: Self-pay | Admitting: Podiatry

## 2022-09-18 ENCOUNTER — Encounter (HOSPITAL_BASED_OUTPATIENT_CLINIC_OR_DEPARTMENT_OTHER): Payer: Medicare Other | Admitting: Pulmonary Disease

## 2022-09-18 DIAGNOSIS — M10072 Idiopathic gout, left ankle and foot: Secondary | ICD-10-CM

## 2022-09-18 NOTE — Telephone Encounter (Signed)
Do you want pt to continue this medication allopurinol (ZYLOPRIM) 100 MG tablet. If so she is out of refills. [facility] on [address]. Please Advise

## 2022-09-24 DIAGNOSIS — R002 Palpitations: Secondary | ICD-10-CM | POA: Diagnosis not present

## 2022-09-29 ENCOUNTER — Ambulatory Visit: Payer: Medicare Other | Admitting: Internal Medicine

## 2022-10-07 ENCOUNTER — Ambulatory Visit: Payer: Medicare Other | Admitting: Internal Medicine

## 2022-10-09 ENCOUNTER — Ambulatory Visit: Payer: Medicare Other | Admitting: Internal Medicine

## 2022-10-16 ENCOUNTER — Ambulatory Visit: Payer: Medicare Other | Admitting: Internal Medicine

## 2022-10-16 ENCOUNTER — Encounter: Payer: Self-pay | Admitting: Internal Medicine

## 2022-10-16 VITALS — BP 130/70 | HR 67 | Resp 18 | Ht 63.5 in | Wt 185.4 lb

## 2022-10-16 DIAGNOSIS — E785 Hyperlipidemia, unspecified: Secondary | ICD-10-CM

## 2022-10-16 DIAGNOSIS — Z794 Long term (current) use of insulin: Secondary | ICD-10-CM | POA: Diagnosis not present

## 2022-10-16 DIAGNOSIS — E1122 Type 2 diabetes mellitus with diabetic chronic kidney disease: Secondary | ICD-10-CM | POA: Diagnosis not present

## 2022-10-16 DIAGNOSIS — Z7984 Long term (current) use of oral hypoglycemic drugs: Secondary | ICD-10-CM | POA: Diagnosis not present

## 2022-10-16 DIAGNOSIS — N183 Chronic kidney disease, stage 3 unspecified: Secondary | ICD-10-CM

## 2022-10-16 LAB — POCT GLYCOSYLATED HEMOGLOBIN (HGB A1C): Hemoglobin A1C: 8.5 % — AB (ref 4.0–5.6)

## 2022-10-16 MED ORDER — INSULIN GLARGINE 100 UNIT/ML ~~LOC~~ SOLN: 20.0000 [IU] | Freq: Every day | SUBCUTANEOUS | 3 refills | Status: DC

## 2022-10-16 NOTE — Progress Notes (Addendum)
Patient ID: Yolanda Saunders, female   DOB: 1945-10-04, 77 y.o.   MRN: [id number]  HPI: Yolanda Saunders is a 77 y.o.-year-old female, returning for follow-up for DM2, dx 1999, insulin-dependent since ~2012, uncontrolled, with complications (CHF, CKD stage 3, h/o stroke post cardiac cath in 2002 and 06/2020, CVA 06/2020, macroalbuminuria). She saw Dr Sharl Ma in the past.  Last visit with me 4 months ago.  She is here with her niece, who is her caregiver.  Interim history: After her stroke in 06/2020 >>  developed blindness in the L eye and also, R eye vision affected (worse), dyslexia. He lives in [facility]. She likes it there.  However, she had her niece mentioned that she forgets the insulin before the meal and she is usually taking it after the meal.  Also, upon questioning, she is not taking any long-acting insulin. No increased urination, nausea, chest pain. She has blurry vision. She has had pbs with cystitis.  Last hemoglobin A1c was: Lab Results  Component Value Date   HGBA1C 7.8 (A) 05/27/2022   HGBA1C 7.5 (A) 11/14/2021   HGBA1C 7.8 (H) 07/25/2021  09/21/2019: HbA1c 11.5% 07/31/2014: 13.4%   In the past, she had a period in which she could not afford analog insulin products  >> we changed to:  Insulin Before breakfast Before lunch Before dinner  Regular 25 - smaller meal 30 - larger meal 25 - smaller meal 30 - larger meal 25 - smaller meal 30 - larger meal  NPH 40  30    Currently on: - Lantus 40 units twice a day >> (vials)   30 >> 20 units in am >> off >> 8-10 units daily >> now off - Regular (ReliOn): 34 >> 15-20 >> 8-12 >> 8-10 units before >> 8-10 meals - Farxiga 10 mg before b'fast - added this summer Try to take 5-10 units if you have a snack after dinner.  We stopped Invokana 100 mg before b'fast >> lethargy, fatigue -  in 11/2015 I suggested Bydureon 2 mg weekly - in donut hole >> could not start Tried Victoza >> helped, but AP She stopped Metformin  ER 500 mg 2x a day because of AP, but does have IBS. She feels better after she stopped Tried Byetta >> N/V Tried Metformin >> AP, diarrhea, gas Tried SU >> upset stomach, nausea Has been on Invokana before >> yeast inf She had elevated lipase (72) in 09/2011 while on Victoza.  She also reportedly had pancreatitis at the beginning of 2019.  At last visit she could not remember her blood sugars. She is checking daily: - am: 47, 52-119, 169, 173, 290  >> 115-182 >> 60-180 - 2h after b'fast: 58, 214-265 >> 218 >> n/c >> 62, 64 >> n/c - before lunch:  62-144 >> 353/130s >> 63, 68-132, 162 >> n/c  - 2h after lunch: 73-199, 225, 266 >> 30 min after: 157-265 >> 180-200 - before dinner: 257, 285 >> 395/180s >> 86-199 >> n/c >> 38 - 2h after dinner:  223 >> 425/180-185 >> 73-160 >> 93-173 >> n/c - bedtime:  196, 311 >> 409 >> 56, 102-153, 202, 205 >> n/c >> 80-95 - nighttime:  321, 332 >> 252-349 >> 402 >> 46, 178 >> n/c Lowest sugar was: 42 >> 46 >> 40s - at [facility] >> 38.  she has hypoglycemia awareness in the 60s Highest sugar was 600s on Humalog >> .Marland KitchenMarland Kitchen300s >> 200s >> 200.   Glucometer: Free Style  Pt's meals are: - Breakfast: oatmeal - Lunch: sandwich; chicken nuggets and fries - Dinner: heaviest: meat + 2 veggies + dessert later - Snacks: "too many" - cookies, sugary snacks  -+ CKD, last BUN/creatinine: Lab Results  Component Value Date   BUN 34 (H) 09/10/2022   CREATININE 1.74 (H) 09/10/2022  09/21/2019: HbA1c 11.5%, glucose 373, BUN/creatinine 21/1.41, GFR 36  + MAU: Lab Results  Component Value Date   MICRALBCREAT 32.0 (H) 02/11/2016  04/2014: ACR: >300 She sees nephrology. Intolerant to ACEI/ARBs. On Entresto.  -+ HL; last set of lipids: 07/04/2021: 133/39/52/69 Lab Results  Component Value Date   CHOL 114 09/25/2020   HDL 33 (L) 09/25/2020   LDLCALC 59 09/25/2020   LDLDIRECT 144.0 09/24/2018   TRIG 39 07/26/2021   CHOLHDL 3.5 09/25/2020  09/21/2019:  236/255/37/151 07/09/2016: 175/122/36/150 On Lipitor 80 << 40.  - last eye exam was in 2022: + DR. She had cataract surgery.  -She denies numbness and tingling in her feet.  Last foot exam 05/27/2022.  She was seeing Dr. Nita Sickle >> investigation for disequllibrium and word difficulty. ? MS. She also has HTN,  anemia, GERD. During investigation of her back pain in 02/2020, she was found to have diverticulitis.  This was treated.  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  They both passed away in the last year.  ROS: + see HPI  I reviewed pt's medications, allergies, PMH, social hx, family hx, and changes were documented in the history of present illness. Otherwise, unchanged from my initial visit note.  Past Medical History:  Diagnosis Date   Anemia 04/17/11   "long, long years ago"   Angina 04/16/11   "that's what I'm here for"   Anxiety    Arthritis    Hips and knees   Asthma    CAD in native artery 08/07/2021   Carpal tunnel syndrome    Cataracts, bilateral    Chronic combined systolic and diastolic heart failure (HCC) 08/07/2020   CKD (chronic kidney disease), stage III (HCC)    Complication of anesthesia 1987   "affected my eyes; couldn't see anything but blurrs even the next day"   CVA (cerebral infarction)    After cardiac catheter 02/2000   Depression    Edema    Fibromyalgia    GERD (gastroesophageal reflux disease)    Headache(784.0)    High cholesterol    History of bronchitis    Hypertension    IBS (irritable bowel syndrome)    Migraines    PAF (paroxysmal atrial fibrillation) (HCC) 08/07/2021   Panic attacks 04/17/11   "don't take anything for it"   Pneumonia 04/17/11   "probably as many as 7 times"   Renal disorder 04/17/11   "they are working at 60% capacity"   Shortness of breath on exertion    "cause of my asthma"   Stroke (HCC) 2002   residual "problem w/using the right word, left 5 lesions on my brain/MRI; long term memory loss"   Type II diabetes  mellitus (HCC)    Past Surgical History:  Procedure Laterality Date   CARDIAC CATHETERIZATION  2002   CARPAL TUNNEL RELEASE  2003-2010   "twice on left; once on right"   CHOLECYSTECTOMY  1987   DEBRIDEMENT TENNIS ELBOW  2010   EYE SURGERY Bilateral 11/2019   Lenses Implant   histerectomy     LUMBAR LAMINECTOMY/ DECOMPRESSION WITH MET-RX Right 06/01/2020   Procedure: Right Lumbar Four-Five Minimally invasive discectomy;  Surgeon: Jadene Pierini, MD;  Location: [facility] OR;  Service: Neurosurgery;  Laterality: Right;   RIGHT/LEFT HEART CATH AND CORONARY ANGIOGRAPHY N/A 08/07/2020   Procedure: RIGHT/LEFT HEART CATH AND CORONARY ANGIOGRAPHY;  Surgeon: Lyn Records, MD;  Location: [facility] INVASIVE CV LAB;  Service: Cardiovascular;  Laterality: N/A;   VAGINAL HYSTERECTOMY  1977   Social History   Social History   Marital Status: Married   Social History Main Topics   Smoking status: Former Smoker -- 0.75 packs/day for 6 years    Types: Cigarettes    Quit date: 01/21/1980   Smokeless tobacco: Never Used   Alcohol Use: No   Drug Use: No   Social History Narrative   Lives with husband in a one story home.  Has 2 children.  Retired from [employer].  Education: 12th grade.  Trade schools.    Current Outpatient Medications on File Prior to Visit  Medication Sig Dispense Refill   allopurinol (ZYLOPRIM) 100 MG tablet Take 1 tablet by mouth once daily 30 tablet 0   apixaban (ELIQUIS) 5 MG TABS tablet Take 1 tablet by mouth twice daily 60 tablet 5   atorvastatin (LIPITOR) 80 MG tablet Take 1 tablet (80 mg total) by mouth daily. 30 tablet 0   Cephalexin 250 MG tablet Take 250 mg by mouth daily. Daily for UTI prevention.     Cholecalciferol (VITAMIN D-3 PO) Take 1 capsule by mouth daily.     dapagliflozin propanediol (FARXIGA) 10 MG TABS tablet Take 1 tablet (10 mg total) by mouth daily. 90 tablet 3   gabapentin (NEURONTIN) 100 MG capsule Take 100 mg by mouth at bedtime.     glucose blood  (ONETOUCH VERIO) test strip USE AS DIRECTED TO  CHECK  BLOOD  SUGAR  FOUR  TIMES  DAILY 400 each 3   insulin regular (NOVOLIN R) 100 units/mL injection Inject 10-14 Units into the skin 3 (three) times daily before meals. Per sliding scale. OTC Novolin R.     pantoprazole (PROTONIX) 40 MG tablet Take 1 tablet (40 mg total) by mouth daily. 30 tablet 0   sacubitril-valsartan (ENTRESTO) 49-51 MG Take 1 tablet by mouth twice daily 60 tablet 6   sertraline (ZOLOFT) 50 MG tablet Take 50 mg by mouth daily.     spironolactone (ALDACTONE) 25 MG tablet Take 1/2 tablet (12.5 mg total) by mouth daily. 30 tablet 1   torsemide (DEMADEX) 20 MG tablet TAKE 1 TABLET BY MOUTH ON MONDAY, WEDNESDAY AND FRIDAY. TAKE AN EXTRA DOSE FOR WEIGHT OVER 160 LBS 90 tablet 0   vitamin B-12 (CYANOCOBALAMIN) 1000 MCG tablet Take 1,000 mcg by mouth daily.     No current facility-administered medications on file prior to visit.   Allergies  Allergen Reactions   Codeine Other (See Comments)    "makes me crazy; see things; delusional"   Erythromycin Rash and Other (See Comments)    Peeling skin Skin turned "purplish"    Penicillins Rash and Other (See Comments)    Skin blisters   Shellfish Allergy Anaphylaxis and Swelling    Throat swells   Sulfonamide Derivatives Hives   Zestril [Lisinopril] Cough   Toprol Xl [Metoprolol] Diarrhea and Nausea And Vomiting    Rapid weight gain Hallucinations    Actos [Pioglitazone] Other (See Comments)    Upset GI   Aldactone [Spironolactone] Other (See Comments)    Unknown reaction   Amaryl [Glimepiride] Other (See Comments)    Upset GI   Apresoline [Hydralazine] Other (  See Comments)    Unknown reaction   Benicar [Olmesartan] Other (See Comments)    Dizziness Headaches   Byetta 10 Mcg Pen [Exenatide] Nausea And Vomiting   Catapres [Clonidine Hcl] Other (See Comments)    Unknown reaction   Flexeril [Cyclobenzaprine] Other (See Comments)    Drowsiness    Glucotrol [Glipizide]  Other (See Comments)    Upset GI   Glyburide-Metformin Other (See Comments)    Myalgias   Humalog [Insulin Lispro] Other (See Comments)    Headache Severe hyperglycemia   Invokana [Canagliflozin] Other (See Comments)    Yeast infections   Januvia [Sitagliptin] Other (See Comments)    UTI   Neurontin [Gabapentin] Other (See Comments)    Anxiety    Norvasc [Amlodipine] Other (See Comments)    Syncope   Prednisone Other (See Comments)    Unknown reaction   Propoxyphene Other (See Comments)    Unknown reaction Darvocet-N   Reglan [Metoclopramide] Other (See Comments)    Unknown reaction   Septra [Sulfamethoxazole-Trimethoprim] Hives   Trimethoprim    Ultram [Tramadol] Other (See Comments)    Anxiety    Victoza [Liraglutide] Diarrhea    Upset GI   Zocor [Simvastatin] Other (See Comments)    Myalgias    Cozaar [Losartan Potassium] Rash    Upset GI   Novolin 70-30 [Insulin Nph Isophane & Regular] Swelling and Rash   Sulfa Antibiotics Hives and Rash   Family History  Problem Relation Age of Onset   Coronary artery disease Father    Diabetes Mellitus I Father    CVA Mother    Stroke Mother    Diabetes Mellitus I Mother    Diabetes Mellitus I Sister    Diabetes Mellitus I Brother    Diabetes Mellitus I Maternal Grandmother    Diabetes Mellitus I Maternal Grandfather    Diabetes Mellitus I Paternal Grandmother    Diabetes Mellitus I Paternal Grandfather    Diabetes Mellitus I Brother    Aortic aneurysm Son    PE: BP 130/70 (BP Location: Right Arm, Patient Position: Sitting, Cuff Size: Large)   Pulse 67   Resp 18   Ht 5' 3.5" (1.613 m)   Wt 185 lb 6.4 oz (84.1 kg)   SpO2 99%   BMI 32.33 kg/m   Wt Readings from Last 10 Encounters:  10/16/22 185 lb 6.4 oz (84.1 kg)  09/10/22 179 lb 9.6 oz (81.5 kg)  08/19/22 179 lb (81.2 kg)  06/06/22 174 lb (78.9 kg)  05/27/22 174 lb (78.9 kg)  05/19/22 177 lb (80.3 kg)  11/14/21 166 lb 12.8 oz (75.7 kg)  10/24/21 168 lb 6.4 oz  (76.4 kg)  10/03/21 160 lb (72.6 kg)  08/28/21 164 lb 9.6 oz (74.7 kg)   Constitutional: Slightly overweight, in NAD ENT: no neck masses, no cervical lymphadenopathy Cardiovascular: RRR, No MRG Respiratory: CTA B Musculoskeletal: no deformities Skin:no rashes Neurological: no tremor with outstretched hands  ASSESSMENT: 1. DM2, insulin-dependent, uncontrolled, with complications - CHF - CKD stage 3 - h/o stroke post cardiac cath in 2002 - macroalbuminuria >> improved - Lipoatrophy at the site of insulin injections  -Per patient's niece: she tried to obtain a CGM for the patient but she was not able to do so (2023)  2. Obesity class 2  3. HL  PLAN:  1. Patient with longstanding, uncontrolled, type 2 diabetes, insulin-dependent, on basal large bolus insulin regimen and SGLT2 inhibitor.  Her diabetes management is limited by poor memory.  At  last visit, she came by herself, without a meter or log that she could not remember the blood sugars.  Also, she did not remember exactly the doses of her insulin but she mentioned that they were the one recommended at the previous visit. HbA1c was slightly higher, but without further information about sugars or insulin doses, we could not change her regimen.  I did advise her to try to write the blood sugars down or at least to bring her meter to the visits. -At today's visit, she continues on the SGLT2 inhibitor, but will need refills.  The niece mentions that she will need a refill but she is not sure where the patient is getting it from.  She was previously relying on samples. -Upon questioning, she is not taking long-acting insulin as Lantus was too expensive in the past.  However, the patient also mentions that insulin pens were very confusing for her after her stroke.  She would prefer to have vitals.  We discussed that she will need long-acting insulin and in fact, since as of now she has episodes of hypoglycemia most likely related to taking  regular insulin after a meal, we discussed about switching from mealtime to only basal insulin.  I will advise her to only take Lantus in the morning (will send vials to her pharmacy) and not cover the meals for now.  Will also continue [place] for now.  I feel that this would be a safer regimen for her, especially as her knees, who visits once a week, can prepare the syringes for the week for her. -I again brought up the topic of a CGM, but patient is to confused by the device and would not want to use it. - I advised her to:  Patient Instructions  Please continue: - Farxiga 10 mg before b'fast  Please stop the R insulin and start: - Lantus 20 units in am  Please return in 3 months.  - we checked her HbA1c: 8.5% (higher) - advised to check sugars at different times of the day - 2x a day, rotating check times - advised for yearly eye exams >> she is UTD - return to clinic in 3 months  2. . Obesity class 2 -Will continue [place] which also help with weight loss -She gained 8 pounds before last visit, but previously lost 27 pounds -Since last visit, she gained 11 pounds and niece mentions that this is likely due to the fact that at home she was eating only 1 meal a day, while at the facility, she eats 3 meals a day.  3. HL -Reviewed latest lipid panel from 09/2020: LDL slightly above our target of less than 55, HDL slightly low: Lab Results  Component Value Date   CHOL 114 09/25/2020   HDL 33 (L) 09/25/2020   LDLCALC 59 09/25/2020   LDLDIRECT 144.0 09/24/2018   TRIG 39 07/26/2021   CHOLHDL 3.5 09/25/2020  -Continues on Lipitor 80 mg daily without side effects -She is due for another lipid panel-will check today  Component     Latest Ref Rng 10/16/2022  Cholesterol     0 - 200 mg/dL 161   Triglycerides     0.0 - 149.0 mg/dL 09.6   HDL Cholesterol     >39.00 mg/dL 04.54   VLDL     0.0 - 40.0 mg/dL 09.8   Total CHOL/HDL Ratio 3   NonHDL 83.44   Hemoglobin A1C     4.0 - 5.6 %  8.5 !  LDL (calc)     0 - 99 mg/dL 65   Lipid fractions are at goal with the exception was slightly high LDL.  Carlus Pavlov, MD PhD [facility]

## 2022-10-16 NOTE — Patient Instructions (Addendum)
Please continue: - Farxiga 10 mg before b'fast  Please stop the R insulin and start: - Lantus 20 units in am  Please return in 3 months.

## 2022-10-16 NOTE — Addendum Note (Signed)
Addended by: Tera Partridge on: 10/16/2022 04:23 PM   Modules accepted: Orders

## 2022-10-17 ENCOUNTER — Telehealth: Payer: Self-pay

## 2022-10-17 LAB — LIPID PANEL
Cholesterol: 136 mg/dL (ref 0–200)
HDL: 53 mg/dL (ref >39.00)
LDL Cholesterol: 65 mg/dL (ref 0–99)
NonHDL: 83.44
Total CHOL/HDL Ratio: 3
Triglycerides: 94 mg/dL (ref 0.0–149.0)
VLDL: 18.8 mg/dL (ref 0.0–40.0)

## 2022-10-17 NOTE — Telephone Encounter (Signed)
-----   Message from Carlus Pavlov sent at 10/17/2022  9:23 AM EDT ----- Q, As discussed, can you please call patient's niece to see where we need to send a prescription for Farxiga.  Please let her know that she does not appear to get the medication through patient assistance per our records.  Should we send a prescription to the pharmacy? Ty! C

## 2022-10-17 NOTE — Telephone Encounter (Signed)
Patient and niece called to relay results of labwork and information regarding Farxiga prescription. No answer, no option to leave VM

## 2022-10-17 NOTE — Telephone Encounter (Signed)
Patient's niece called, no answer, VM left requesting return call.

## 2022-10-20 ENCOUNTER — Telehealth: Payer: Self-pay

## 2022-10-20 NOTE — Telephone Encounter (Signed)
-----   Message from [place] sent at 10/17/2022  1:47 PM EDT ----- Can you please call pt.: Her lipid fractions are at goal with the exception of a slightly high LDL, but still much lower than before.  Please continue the current cholesterol medication.

## 2022-10-20 NOTE — Telephone Encounter (Signed)
Patient called, results and advisement relayed to niece/guardian. Also discussed if Yolanda Saunders should be sent over to pharmacy as questioned by MD, however per niece even with insurance still could not afford to be on medication. RN to check with MD tomorrow when she returns as to how to proceed.

## 2022-11-20 ENCOUNTER — Ambulatory Visit: Payer: Medicare Other | Admitting: Adult Health

## 2022-11-25 ENCOUNTER — Ambulatory Visit: Payer: Medicare Other | Admitting: Adult Health

## 2022-12-01 ENCOUNTER — Telehealth (HOSPITAL_BASED_OUTPATIENT_CLINIC_OR_DEPARTMENT_OTHER): Payer: Self-pay | Admitting: Cardiovascular Disease

## 2022-12-01 NOTE — Telephone Encounter (Signed)
Patient calling the office for samples of medication:   1.  What medication and dosage are you requesting samples for? Yolanda Saunders and Farxiga  2.  Are you currently out of this medication?

## 2022-12-05 ENCOUNTER — Telehealth: Payer: Self-pay

## 2022-12-05 MED ORDER — INSULIN REGULAR HUMAN 100 UNIT/ML IJ SOLN: 8.0000 [IU] | Freq: Two times a day (BID) | INTRAMUSCULAR | 1 refills | Status: DC

## 2022-12-05 NOTE — Telephone Encounter (Signed)
Received a vm from [place] stating that this patient BS has been running high since being taken off the Novolin R and put on Lantus. She has been using 22-24 units of lantus and her blood sugar still runs high.   I tried calling Lorene Dy back but I had to leave a vm. What do you recommend?

## 2022-12-05 NOTE — Telephone Encounter (Signed)
Yolanda Saunders has been notified and voices understanding. Rx has been sent to the pharmacy.   Requested Prescriptions   Signed Prescriptions Disp Refills   insulin regular (NOVOLIN R) 100 units/mL injection 30 mL 1    Sig: Inject 0.08-0.1 mLs (8-10 Units total) into the skin 2 (two) times daily before a meal.    Authorizing Provider: Carlus Pavlov    Ordering User: Pollie Meyer

## 2022-12-12 ENCOUNTER — Ambulatory Visit: Payer: Medicare Other | Admitting: Adult Health

## 2022-12-21 ENCOUNTER — Emergency Department (HOSPITAL_BASED_OUTPATIENT_CLINIC_OR_DEPARTMENT_OTHER)
Admission: EM | Admit: 2022-12-21 | Discharge: 2022-12-21 | Disposition: A | Discharge status: Home / Self Care | Payer: Medicare Other | Attending: Emergency Medicine | Admitting: Emergency Medicine

## 2022-12-21 DIAGNOSIS — I2510 Atherosclerotic heart disease of native coronary artery without angina pectoris: Secondary | ICD-10-CM | POA: Diagnosis not present

## 2022-12-21 DIAGNOSIS — I509 Heart failure, unspecified: Secondary | ICD-10-CM | POA: Diagnosis not present

## 2022-12-21 DIAGNOSIS — Z794 Long term (current) use of insulin: Secondary | ICD-10-CM | POA: Insufficient documentation

## 2022-12-21 DIAGNOSIS — B349 Viral infection, unspecified: Secondary | ICD-10-CM | POA: Diagnosis not present

## 2022-12-21 DIAGNOSIS — Z7984 Long term (current) use of oral hypoglycemic drugs: Secondary | ICD-10-CM | POA: Insufficient documentation

## 2022-12-21 DIAGNOSIS — Z8673 Personal history of transient ischemic attack (TIA), and cerebral infarction without residual deficits: Secondary | ICD-10-CM | POA: Diagnosis not present

## 2022-12-21 DIAGNOSIS — Z79899 Other long term (current) drug therapy: Secondary | ICD-10-CM | POA: Insufficient documentation

## 2022-12-21 DIAGNOSIS — I130 Hypertensive heart and chronic kidney disease with heart failure and stage 1 through stage 4 chronic kidney disease, or unspecified chronic kidney disease: Secondary | ICD-10-CM | POA: Insufficient documentation

## 2022-12-21 DIAGNOSIS — N189 Chronic kidney disease, unspecified: Secondary | ICD-10-CM | POA: Diagnosis not present

## 2022-12-21 DIAGNOSIS — U071 COVID-19: Secondary | ICD-10-CM | POA: Insufficient documentation

## 2022-12-21 DIAGNOSIS — Z7901 Long term (current) use of anticoagulants: Secondary | ICD-10-CM | POA: Diagnosis not present

## 2022-12-21 DIAGNOSIS — R059 Cough, unspecified: Secondary | ICD-10-CM | POA: Diagnosis present

## 2022-12-21 LAB — SARS CORONAVIRUS 2 BY RT PCR: SARS Coronavirus 2 by RT PCR: POSITIVE — AB

## 2022-12-21 MED ORDER — ACETAMINOPHEN 500 MG PO TABS: 1000.0000 mg | ORAL_TABLET | Freq: Once | ORAL | Status: DC

## 2022-12-21 NOTE — ED Triage Notes (Signed)
Pt reports "hard" cough, nausea and fever for past few days. Husband Covid+ and she believes she has it as well now.  Pt AAOx4, in NAD in triage.

## 2022-12-21 NOTE — ED Notes (Signed)
Pt is alert and oriented, states her niece was legal guardian when she was on a ventilator. She states she tried to get her niece on the phone and unsuccessful. Dc home as patient is alert and oriented

## 2022-12-21 NOTE — Discharge Instructions (Addendum)
You can have robitussin with dextamethorphan for cough and symptoms but also take tylenol every 6 hours as needed for fever and stay hydrated.  Return if you start having shortness of breath, wheezing, vomiting or symptoms are not improving

## 2022-12-21 NOTE — ED Notes (Signed)
Dc instructions reviewed with patient. Patient voiced understanding. Dc with belongings.  °

## 2022-12-21 NOTE — ED Provider Notes (Signed)
Yolanda Saunders   CSN: [id number] Arrival date & time: 12/21/22  1233     History  Chief Complaint  Patient presents with   Cough    Yolanda Saunders is a 77 y.o. female.  Patient is a 77 year old female with a history of hypertension, hyperlipidemia, stroke, chronic kidney disease, CHF, paroxysmal atrial fibrillation, CAD who is presenting today with a 12-hour history of cough, fever, myalgias and fatigue.  Patient reports starting to feel this way this morning.  She reports that she was at the urgent care with her boyfriend yesterday because he was having similar symptoms and at that time he tested positive for COVID.  She was not ill until today.  She does not have new shortness of breath and denies any nausea vomiting or diarrhea.  The history is provided by the patient and medical records.  Cough      Home Medications Prior to Admission medications   Medication Sig Start Date End Date Taking? Authorizing Provider  allopurinol (ZYLOPRIM) 100 MG tablet Take 1 tablet by mouth once daily 07/28/22   McCaughan, Dia D, DPM  apixaban (ELIQUIS) 5 MG TABS tablet Take 1 tablet by mouth twice daily 05/19/22   Chilton Si, MD  atorvastatin (LIPITOR) 80 MG tablet Take 1 tablet (80 mg total) by mouth daily. 08/09/20   Elgergawy, Leana Roe, MD  Cephalexin 250 MG tablet Take 250 mg by mouth daily. Daily for UTI prevention. 03/25/22   [provider]  Cholecalciferol (VITAMIN D-3 PO) Take 1 capsule by mouth daily.    [provider]  dapagliflozin propanediol (FARXIGA) 10 MG TABS tablet Take 1 tablet (10 mg total) by mouth daily. 06/10/22   Alver Sorrow, NP  gabapentin (NEURONTIN) 100 MG capsule Take 100 mg by mouth at bedtime. 07/05/21   [provider]  glucose blood (ONETOUCH VERIO) test strip USE AS DIRECTED TO  CHECK  BLOOD  SUGAR  FOUR  TIMES  DAILY 05/27/22   Carlus Pavlov, MD  insulin glargine  (LANTUS) 100 UNIT/ML injection Inject 0.2 mLs (20 Units total) into the skin daily. 10/16/22   Carlus Pavlov, MD  insulin regular (NOVOLIN R) 100 units/mL injection Inject 0.08-0.1 mLs (8-10 Units total) into the skin 2 (two) times daily before a meal. 12/05/22   Carlus Pavlov, MD  pantoprazole (PROTONIX) 40 MG tablet Take 1 tablet (40 mg total) by mouth daily. 08/09/20   Elgergawy, Leana Roe, MD  sacubitril-valsartan (ENTRESTO) 49-51 MG Take 1 tablet by mouth twice daily 06/26/22   Chilton Si, MD  sertraline (ZOLOFT) 50 MG tablet Take 50 mg by mouth daily. 07/05/21   [provider]  spironolactone (ALDACTONE) 25 MG tablet Take 1/2 tablet (12.5 mg total) by mouth daily. 07/31/21   Lynnell Catalan, MD  torsemide (DEMADEX) 20 MG tablet TAKE 1 TABLET BY MOUTH ON MONDAY, WEDNESDAY AND FRIDAY. TAKE AN EXTRA DOSE FOR WEIGHT OVER 160 LBS 09/01/22   Chilton Si, MD  vitamin B-12 (CYANOCOBALAMIN) 1000 MCG tablet Take 1,000 mcg by mouth daily.    [provider]      Allergies    Codeine, Erythromycin, Penicillins, Shellfish allergy, Sulfonamide derivatives, Zestril [lisinopril], Toprol xl [metoprolol], Actos [pioglitazone], Aldactone [spironolactone], Amaryl [glimepiride], Apresoline [hydralazine], Benicar [olmesartan], Byetta 10 mcg pen [exenatide], Catapres [clonidine hcl], Flexeril [cyclobenzaprine], Glucotrol [glipizide], Glyburide-metformin, Humalog [insulin lispro], Invokana [canagliflozin], Januvia [sitagliptin], Neurontin [gabapentin], Norvasc [amlodipine], Prednisone, Propoxyphene, Reglan [metoclopramide], Septra [sulfamethoxazole-trimethoprim], Trimethoprim, Ultram [tramadol], Victoza [liraglutide], Zocor [  simvastatin], Cozaar [losartan potassium], Novolin 70-30 [insulin nph isophane & regular], and Sulfa antibiotics    Review of Systems   Review of Systems  Respiratory:  Positive for cough.     Physical Exam Updated Vital Signs BP (!) 125/58 (BP Location: Right  Arm)   Pulse 69   Temp 99.8 F (37.7 C) (Oral)   Resp 19   SpO2 99%  Physical Exam Vitals and nursing Saunders reviewed.  Constitutional:      General: She is not in acute distress.    Appearance: She is well-developed.  HENT:     Head: Normocephalic and atraumatic.  Eyes:     Pupils: Pupils are equal, round, and reactive to light.  Cardiovascular:     Rate and Rhythm: Normal rate and regular rhythm.     Heart sounds: Murmur heard.     No friction rub.  Pulmonary:     Effort: Pulmonary effort is normal.     Breath sounds: Normal breath sounds. No wheezing or rales.     Comments: Coarse cough but clear breath sounds Abdominal:     General: Bowel sounds are normal. There is no distension.     Palpations: Abdomen is soft.     Tenderness: There is no abdominal tenderness. There is no guarding or rebound.  Musculoskeletal:        General: No tenderness. Normal range of motion.     Right lower leg: No edema.     Left lower leg: No edema.     Comments: No edema  Skin:    General: Skin is warm and dry.     Findings: No rash.  Neurological:     Mental Status: She is alert and oriented to person, place, and time.     Cranial Nerves: No cranial nerve deficit.  Psychiatric:        Behavior: Behavior normal.     ED Results / Procedures / Treatments   Labs (all labs ordered are listed, but only abnormal results are displayed) Labs Reviewed  SARS CORONAVIRUS 2 BY RT PCR - Abnormal; Notable for the following components:      Result Value   SARS Coronavirus 2 by RT PCR POSITIVE (*)    All other components within normal limits    EKG None  Radiology No results found.  Procedures Procedures    Medications Ordered in ED Medications  acetaminophen (TYLENOL) tablet 1,000 mg (1,000 mg Oral Not Given 12/21/22 1358)    ED Course/ Medical Decision Making/ A&P                                 Medical Decision Making Risk OTC drugs.   Pt with multiple medical problems and  comorbidities and presenting today with a complaint that caries a high risk for morbidity and mortality.  Here today with symptoms concerning for a viral process with myalgias, fever, cough.  In the setting of her boyfriend testing positive for COVID yesterday.  Patient is not a candidate for Paxlovid due to multiple drug allergies, chronic kidney disease and interactions with other medications.  Discussed this with the patient.  Discussed her taking Tylenol as needed for fevers, Robitussin for the cough and staying hydrated and resting.  Did give strict return precautions.  Also recommended follow-up with her PCP later this week for recheck.  Patient sats are 99% on room air at this time she has no wheezing and  low suspicion for pneumonia.         Final Clinical Impression(s) / ED Diagnoses Final diagnoses:  Viral illness    Rx / DC Orders ED Discharge Orders     None         Gwyneth Sprout, MD 12/21/22 [phone]

## 2022-12-22 ENCOUNTER — Telehealth (HOSPITAL_BASED_OUTPATIENT_CLINIC_OR_DEPARTMENT_OTHER): Payer: Self-pay | Admitting: *Deleted

## 2022-12-22 ENCOUNTER — Telehealth: Payer: Self-pay | Admitting: Cardiovascular Disease

## 2022-12-22 ENCOUNTER — Other Ambulatory Visit (HOSPITAL_BASED_OUTPATIENT_CLINIC_OR_DEPARTMENT_OTHER): Payer: Self-pay | Admitting: Cardiovascular Disease

## 2022-12-22 ENCOUNTER — Ambulatory Visit (HOSPITAL_BASED_OUTPATIENT_CLINIC_OR_DEPARTMENT_OTHER): Payer: Medicare Other | Admitting: Cardiovascular Disease

## 2022-12-22 NOTE — Telephone Encounter (Signed)
Patient did not show up for her visit and just needs to be rescheduled with APP or Dr Duke Salvia

## 2022-12-22 NOTE — Telephone Encounter (Signed)
Pt c/o medication issue:  1. Name of Medication:  apixaban (ELIQUIS) 5 MG TABS tablet sacubitril-valsartan (ENTRESTO) 49-51 MG dapagliflozin propanediol (FARXIGA) 10 MG TABS tablet  2. How are you currently taking this medication (dosage and times per day)?   3. Are you having a reaction (difficulty breathing--STAT)?   4. What is your medication issue?   Patient's niece states each of these medications will cost the patient about $400-$500 and she would like to discuss patient's options. 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

## 2022-12-22 NOTE — Telephone Encounter (Signed)
Patient would like to discuss other options 2/2 med cost.

## 2022-12-22 NOTE — Progress Notes (Unsigned)
  Cardiology Office Note:  .   Date:  12/22/2022  ID:  Yolanda Saunders, DOB 02-Mar-1945, MRN [id number] PCP: Noberto Retort, MD  [facility] Providers Cardiologist:  Chilton Si, MD Electrophysiologist:  Lanier Prude, MD { Click to update primary MD,subspecialty MD or APP then REFRESH:1}   History of Present Illness: .   Yolanda Saunders is a 77 y.o. female ***  ROS: ***  Studies Reviewed: .        *** Risk Assessment/Calculations:   {Does this patient have ATRIAL FIBRILLATION?:[phone]}         Physical Exam:   VS:  There were no vitals taken for this visit.   Wt Readings from Last 3 Encounters:  10/16/22 185 lb 6.4 oz (84.1 kg)  09/10/22 179 lb 9.6 oz (81.5 kg)  08/19/22 179 lb (81.2 kg)    GEN: Well nourished, well developed in no acute distress NECK: No JVD; No carotid bruits CARDIAC: ***RRR, no murmurs, rubs, gallops RESPIRATORY:  Clear to auscultation without rales, wheezing or rhonchi  ABDOMEN: Soft, non-tender, non-distended EXTREMITIES:  No edema; No deformity   ASSESSMENT AND PLAN: .   ***    {Are you ordering a CV Procedure (e.g. stress test, cath, DCCV, TEE, etc)?   Press F2        :[id number]}  Dispo: ***  Signed, Chilton Si, MD

## 2022-12-22 NOTE — Telephone Encounter (Signed)
Patient scheduled to see Dr Duke Salvia this morning She was in urgent care yesterday with cough symptoms and her boyfriend has COVID Per Dr Duke Salvia call to see about making appointment virtual  Left message to call back

## 2022-12-24 MED ORDER — DAPAGLIFLOZIN PROPANEDIOL 10 MG PO TABS: 10.0000 mg | ORAL_TABLET | Freq: Every day | ORAL | 3 refills | Status: DC

## 2022-12-24 MED ORDER — ENTRESTO 49-51 MG PO TABS: 1.0000 | ORAL_TABLET | Freq: Two times a day (BID) | ORAL | 3 refills | Status: DC

## 2022-12-24 NOTE — Telephone Encounter (Signed)
I spoke with Yolanda Saunders who is pt POA (per DPR). Can get [employer] grant for [place].  No good options for Eliquis but did discuss the changes in medicare for next year When trying to submit for [employer] it says that account is already created for this SSN but when I search existing members with the [employer] it says no account exists. I called [employer] and left # to call back.   Spoke with [employer] and was able to enroll patient in cardiomyopathy fund Card # [id number] Info called into [facility]. I asked them to link to entresto and farxgia. Rx were sent. I asked them to put on file as [name] said she still had a few weeks worth. I called crista and LVM

## 2022-12-26 NOTE — Telephone Encounter (Signed)
Pharmacy didn't have grant info when POA went to pick up Gave to them again  Card No. [id number] Card Status Active BIN [id number] PCN [id number] [facility] [id number]

## 2023-01-20 ENCOUNTER — Ambulatory Visit: Payer: Medicare Other | Admitting: Internal Medicine

## 2023-01-23 ENCOUNTER — Ambulatory Visit: Payer: Medicare Other | Admitting: Internal Medicine

## 2023-01-23 ENCOUNTER — Encounter: Payer: Self-pay | Admitting: Internal Medicine

## 2023-01-23 VITALS — BP 122/70 | HR 67 | Ht 63.5 in | Wt 191.4 lb

## 2023-01-23 DIAGNOSIS — E785 Hyperlipidemia, unspecified: Secondary | ICD-10-CM

## 2023-01-23 DIAGNOSIS — Z794 Long term (current) use of insulin: Secondary | ICD-10-CM

## 2023-01-23 DIAGNOSIS — E1122 Type 2 diabetes mellitus with diabetic chronic kidney disease: Secondary | ICD-10-CM | POA: Diagnosis not present

## 2023-01-23 DIAGNOSIS — N183 Chronic kidney disease, stage 3 unspecified: Secondary | ICD-10-CM | POA: Diagnosis not present

## 2023-01-23 DIAGNOSIS — E66811 Obesity, class 1: Secondary | ICD-10-CM | POA: Diagnosis not present

## 2023-01-23 LAB — POCT GLYCOSYLATED HEMOGLOBIN (HGB A1C): Hemoglobin A1C: 8.4 % — AB (ref 4.0–5.6)

## 2023-01-23 NOTE — Progress Notes (Signed)
 Patient ID: Yolanda Saunders, female   DOB: Aug 22, 1945, 78 y.o.   MRN: [id number]  HPI: Yolanda Saunders is a 78 y.o.-year-old female, returning for follow-up for DM2, dx 1999, insulin -dependent since ~2012, uncontrolled, with complications (CHF, CKD stage 3, h/o stroke post cardiac cath in 2002 and 06/2020, CVA 06/2020, macroalbuminuria). She saw Dr Faythe in the past.  Last visit with me 3.5 months ago.  She is here with her niece, who is her caregiver.  Interim history: After her stroke in 06/2020 >>  developed blindness in the L eye and also, R eye vision affected (worse), dyslexia. He lives in [facility]. She likes it there. No increased urination, nausea, chest pain. She has blurry vision. She had COVID-19 infection 1 month ago.  Last hemoglobin A1c was: Lab Results  Component Value Date   HGBA1C 8.5 (A) 10/16/2022   HGBA1C 7.8 (A) 05/27/2022   HGBA1C 7.5 (A) 11/14/2021  09/21/2019: HbA1c 11.5% 07/31/2014: 13.4%   In the past, she had a period in which she could not afford analog insulin  products  >> we changed to:  Insulin  Before breakfast Before lunch Before dinner  Regular 25 - smaller meal 30 - larger meal 25 - smaller meal 30 - larger meal 25 - smaller meal 30 - larger meal  NPH 40  30    Currently on: - Lantus  40 units twice a day >> (vials)   30 >> 20 units in am >> off >> 8-10 units daily >> off >> restarted 20 units daily 09/2022>> 28-30 units daily - stopped 2 mo ago - Regular (ReliOn): 34 >> 15-20 >> 8-12 >> 8-10 units before meals >> stopped 09/2022 >> restarted 10-15 units 2-3x a day - Farxiga  10 mg before b'fast - added this summer Try to take 5-10 units if you have a snack after dinner.  We stopped Invokana  100 mg before b'fast >> lethargy, fatigue -  in 11/2015 I suggested Bydureon 2 mg weekly - in donut hole >> could not start Tried Victoza >> helped, but AP She stopped Metformin  ER 500 mg 2x a day because of AP, but does have IBS. She feels  better after she stopped Tried Byetta >> N/V Tried Metformin  >> AP, diarrhea, gas Tried SU >> upset stomach, nausea Has been on Invokana  before >> yeast inf She had elevated lipase (72) in 09/2011 while on Victoza.  She also reportedly had pancreatitis at the beginning of 2019.  At last visit she could not remember her blood sugars. She is checking daily: - am: 47, 52-119, 169, 173, 290  >> 115-182 >> 60-180 >> 120s - 2h after b'fast: 58, 214-265 >> 218 >> n/c >> 62, 64 >> n/c - before lunch:  62-144 >> 353/130s >> 63, 68-132, 162 >> n/c  - 2h after lunch: 73-199, 225, 266 >> 157-265 >> 180-200 >> n/c - before dinner:  395/180s >> 86-199 >> n/c >> 38  >> n/c - 2h after dinner:  425/180-185 >> 73-160 >> 93-173 >> n/c - bedtime:   56, 102-153, 202, 205 >> n/c >> 80-95 >> <250 - nighttime:  252-349 >> 402 >> 46, 178 >> n/c Lowest sugar was: 42 >> 46 >> 40s - at the [facility] >> 38 >> 120.  she has hypoglycemia awareness in the 60s Highest sugar was 600s on Humalog  >> .SABRASABRA300s >> 200s >> 200 >> 250.   Glucometer: Free Style  Pt's meals are: - Breakfast: oatmeal - Lunch: sandwich; chicken nuggets and fries -  Dinner: heaviest: meat + 2 veggies + dessert later - Snacks: too many - cookies, sugary snacks  -+ CKD, last BUN/creatinine: Lab Results  Component Value Date   BUN 34 (H) 09/10/2022   CREATININE 1.74 (H) 09/10/2022   + MAU: Lab Results  Component Value Date   MICRALBCREAT 32.0 (H) 02/11/2016  04/2014: ACR: >300 She sees nephrology. Intolerant to ACEI/ARBs. On Entresto .  -+ HL; last set of lipids: Lab Results  Component Value Date   CHOL 136 10/16/2022   HDL 53.00 10/16/2022   LDLCALC 65 10/16/2022   LDLDIRECT 144.0 09/24/2018   TRIG 94.0 10/16/2022   CHOLHDL 3 10/16/2022  07/04/2021: 133/39/52/69 09/21/2019: 236/255/37/151 07/09/2016: 175/122/36/150 On Lipitor  80 << 40.  - last eye exam was in 2022: + DR. She had cataract surgery.  -She denies numbness and tingling  in her feet.  Last foot exam 05/27/2022.  She was seeing Dr. Tonita Blanch >> investigation for disequllibrium and word difficulty. ? MS. She also has HTN,  anemia, GERD. During investigation of her back pain in 02/2020, she was found to have diverticulitis.  This was treated.  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  They both passed away.  ROS: + see HPI  I reviewed pt's medications, allergies, PMH, social hx, family hx, and changes were documented in the history of present illness. Otherwise, unchanged from my initial visit note.  Past Medical History:  Diagnosis Date   Anemia 04/17/11   long, long years ago   Angina 04/16/11   that's what I'm here for   Anxiety    Arthritis    Hips and knees   Asthma    CAD in native artery 08/07/2021   Carpal tunnel syndrome    Cataracts, bilateral    Chronic combined systolic and diastolic heart failure (HCC) 08/07/2020   CKD (chronic kidney disease), stage III (HCC)    Complication of anesthesia 1987   affected my eyes; couldn't see anything but blurrs even the next day   CVA (cerebral infarction)    After cardiac catheter 02/2000   Depression    Edema    Fibromyalgia    GERD (gastroesophageal reflux disease)    Headache(784.0)    High cholesterol    History of bronchitis    Hypertension    IBS (irritable bowel syndrome)    Migraines    PAF (paroxysmal atrial fibrillation) (HCC) 08/07/2021   Panic attacks 04/17/11   don't take anything for it   Pneumonia 04/17/11   probably as many as 7 times   Renal disorder 04/17/11   they are working at 60% capacity   Shortness of breath on exertion    cause of my asthma   Stroke (HCC) 2002   residual problem w/using the right word, left 5 lesions on my brain/MRI; long term memory loss   Type II diabetes mellitus (HCC)    Past Surgical History:  Procedure Laterality Date   CARDIAC CATHETERIZATION  2002   CARPAL TUNNEL RELEASE  2003-2010   twice on left; once on right    CHOLECYSTECTOMY  1987   DEBRIDEMENT TENNIS ELBOW  2010   EYE SURGERY Bilateral 11/2019   Lenses Implant   histerectomy     LUMBAR LAMINECTOMY/ DECOMPRESSION WITH MET-RX Right 06/01/2020   Procedure: Right Lumbar Four-Five Minimally invasive discectomy;  Surgeon: Cheryle Debby LABOR, MD;  Location: [facility] OR;  Service: Neurosurgery;  Laterality: Right;   RIGHT/LEFT HEART CATH AND CORONARY ANGIOGRAPHY N/A 08/07/2020   Procedure:  RIGHT/LEFT HEART CATH AND CORONARY ANGIOGRAPHY;  Surgeon: Claudene Victory ORN, MD;  Location: [facility] INVASIVE CV LAB;  Service: Cardiovascular;  Laterality: N/A;   VAGINAL HYSTERECTOMY  1977   Social History   Social History   Marital Status: Married   Social History Main Topics   Smoking status: Former Smoker -- 0.75 packs/day for 6 years    Types: Cigarettes    Quit date: 01/21/1980   Smokeless tobacco: Never Used   Alcohol Use: No   Drug Use: No   Social History Narrative   Lives with husband in a one story home.  Has 2 children.  Retired from [employer].  Education: 12th grade.  Trade schools.    Current Outpatient Medications on File Prior to Visit  Medication Sig Dispense Refill   allopurinol  (ZYLOPRIM ) 100 MG tablet Take 1 tablet by mouth once daily 30 tablet 0   apixaban  (ELIQUIS ) 5 MG TABS tablet Take 1 tablet by mouth twice daily 60 tablet 5   atorvastatin  (LIPITOR ) 80 MG tablet Take 1 tablet (80 mg total) by mouth daily. 30 tablet 0   Cephalexin 250 MG tablet Take 250 mg by mouth daily. Daily for UTI prevention.     Cholecalciferol (VITAMIN D-3 PO) Take 1 capsule by mouth daily.     dapagliflozin  propanediol (FARXIGA ) 10 MG TABS tablet Take 1 tablet (10 mg total) by mouth daily. 90 tablet 3   gabapentin  (NEURONTIN ) 100 MG capsule Take 100 mg by mouth at bedtime.     glucose blood (ONETOUCH VERIO) test strip USE AS DIRECTED TO  CHECK  BLOOD  SUGAR  FOUR  TIMES  DAILY 400 each 3   insulin  glargine (LANTUS ) 100 UNIT/ML injection Inject 0.2 mLs (20 Units total)  into the skin daily. 20 mL 3   insulin  regular (NOVOLIN R) 100 units/mL injection Inject 0.08-0.1 mLs (8-10 Units total) into the skin 2 (two) times daily before a meal. 30 mL 1   pantoprazole  (PROTONIX ) 40 MG tablet Take 1 tablet (40 mg total) by mouth daily. 30 tablet 0   sacubitril -valsartan  (ENTRESTO ) 49-51 MG Take 1 tablet by mouth 2 (two) times daily. 180 tablet 3   sertraline  (ZOLOFT ) 50 MG tablet Take 50 mg by mouth daily.     spironolactone  (ALDACTONE ) 25 MG tablet Take 1/2 tablet (12.5 mg total) by mouth daily. 30 tablet 1   torsemide  (DEMADEX ) 20 MG tablet TAKE 1 TABLET BY MOUTH ON MONDAY, WEDNESDAY AND FRIDAY. TAKE AN EXTRA DOSE FOR WEIGHT OVER 160 LBS 90 tablet 0   vitamin B-12 (CYANOCOBALAMIN ) 1000 MCG tablet Take 1,000 mcg by mouth daily.     No current facility-administered medications on file prior to visit.   Allergies  Allergen Reactions   Codeine Other (See Comments)    makes me crazy; see things; delusional   Erythromycin Rash and Other (See Comments)    Peeling skin Skin turned purplish    Penicillins Rash and Other (See Comments)    Skin blisters   Shellfish Allergy Anaphylaxis and Swelling    Throat swells   Sulfonamide Derivatives Hives   Zestril [Lisinopril] Cough   Toprol Xl [Metoprolol] Diarrhea and Nausea And Vomiting    Rapid weight gain Hallucinations    Actos [Pioglitazone] Other (See Comments)    Upset GI   Aldactone  [Spironolactone ] Other (See Comments)    Unknown reaction   Amaryl [Glimepiride] Other (See Comments)    Upset GI   Apresoline  [Hydralazine ] Other (See Comments)    Unknown  reaction   Benicar [Olmesartan] Other (See Comments)    Dizziness Headaches   Byetta 10 Mcg Pen [Exenatide] Nausea And Vomiting   Catapres [Clonidine Hcl] Other (See Comments)    Unknown reaction   Flexeril  [Cyclobenzaprine ] Other (See Comments)    Drowsiness    Glucotrol [Glipizide] Other (See Comments)    Upset GI   Glyburide-Metformin  Other (See  Comments)    Myalgias   Humalog  [Insulin  Lispro] Other (See Comments)    Headache Severe hyperglycemia   Invokana  [Canagliflozin ] Other (See Comments)    Yeast infections   Januvia [Sitagliptin] Other (See Comments)    UTI   Neurontin  [Gabapentin ] Other (See Comments)    Anxiety    Norvasc [Amlodipine] Other (See Comments)    Syncope   Prednisone Other (See Comments)    Unknown reaction   Propoxyphene Other (See Comments)    Unknown reaction Darvocet-N   Reglan [Metoclopramide] Other (See Comments)    Unknown reaction   Septra [Sulfamethoxazole-Trimethoprim] Hives   Trimethoprim    Ultram [Tramadol] Other (See Comments)    Anxiety    Victoza [Liraglutide] Diarrhea    Upset GI   Zocor [Simvastatin] Other (See Comments)    Myalgias    Cozaar [Losartan Potassium] Rash    Upset GI   Novolin 70-30 [Insulin  Nph Isophane & Regular] Swelling and Rash   Sulfa Antibiotics Hives and Rash   Family History  Problem Relation Age of Onset   Coronary artery disease Father    Diabetes Mellitus I Father    CVA Mother    Stroke Mother    Diabetes Mellitus I Mother    Diabetes Mellitus I Sister    Diabetes Mellitus I Brother    Diabetes Mellitus I Maternal Grandmother    Diabetes Mellitus I Maternal Grandfather    Diabetes Mellitus I Paternal Grandmother    Diabetes Mellitus I Paternal Grandfather    Diabetes Mellitus I Brother    Aortic aneurysm Son    PE: BP 122/70   Pulse 67   Ht 5' 3.5 (1.613 m)   Wt 191 lb 6.4 oz (86.8 kg)   SpO2 96%   BMI 33.37 kg/m   Wt Readings from Last 10 Encounters:  01/23/23 191 lb 6.4 oz (86.8 kg)  10/16/22 185 lb 6.4 oz (84.1 kg)  09/10/22 179 lb 9.6 oz (81.5 kg)  08/19/22 179 lb (81.2 kg)  06/06/22 174 lb (78.9 kg)  05/27/22 174 lb (78.9 kg)  05/19/22 177 lb (80.3 kg)  11/14/21 166 lb 12.8 oz (75.7 kg)  10/24/21 168 lb 6.4 oz (76.4 kg)  10/03/21 160 lb (72.6 kg)   Constitutional: Slightly overweight, in NAD ENT: no neck masses, no  cervical lymphadenopathy Cardiovascular: RRR, No MRG, + pitting edema B Respiratory: CTA B Musculoskeletal: no deformities Skin:no rashes Neurological: no tremor with outstretched hands  ASSESSMENT: 1. DM2, insulin -dependent, uncontrolled, with complications - CHF - CKD stage 3 - h/o stroke post cardiac cath in 2002 - macroalbuminuria >> improved - Lipoatrophy at the site of insulin  injections  -Per patient's niece: she tried to obtain a CGM for the patient but she was not able to do so (2023)  2. Obesity class 2  3. HL  PLAN:  1. Patient with longstanding, uncontrolled, type 2 diabetes, insulin -dependent, on basal-bolus insulin  regimen to 2 inhibitor at last visit.  Her diabetes management is limited by poor memory.  At last visit, she was here with her niece.  The niece offered information about her  regimen and blood sugars.  She was off the Lantus  due to price.  Also, she felt she had hypoglycemic episodes after taking regular insulin .  She was confused with her insulin  regimen after her stroke - she preferred vials.  We discussed at that time about switching from regular insulin  to only Lantus  insulin  once a day to simplify her regimen in the setting of increased confusion.  Her niece was visiting once a week and he would have been easier for her to prepare the simpler regimen for her.  I advised her to continue Farxiga .  The niece was not sure where the patient was getting the Farxiga  before, unclear if this was from patient assistance program.  At last visit I also brought up the topic of a CGM but she mentioned that she was too confused by the device and would not like to use it. -Last visit, patient contacted us  that her sugars were high after stopping regular insulin  so we had to add this back.  She did stop Lantus  after starting this.  She feels that her sugars are better controlled while on regular insulin .  However, unfortunately, they do not bring a blood sugar meter or a log and  they only remember that the sugars in the morning are in the 120s and the sugars at night are lower than 250s.  We discussed about bringing blood sugar records at next visit, but for now, especially as they feel that blood sugars have improved, I advised her to continue the same regimen.  We did discuss that we may need Lantus  in the future but based on the difference between the evening in the morning blood sugars, I did not suggest to start it for now. -I again advised her to try to inject regular insulin  30 minutes before meals. - I advised her to:  Patient Instructions  Please continue: - Farxiga  10 mg before b'fast  - R insulin  10-15 units 2-3x a day before meals  Please return in 3 months.  - we checked her HbA1c: 8.4% (slightly lower) - advised to check sugars at different times of the day - 4x a day, rotating check times - advised for yearly eye exams >> she is UTD - return to clinic in 3 months  2. . Obesity class 1 -We had her on Farxiga  which would have help with weight loss, but unfortunately this is not affordable anymore. -Before last visit, she gained 11 pounds and per niece this was likely due to the fact that at home she was eating only 1 meal a day, while at the facility, she was eating 3 meals a day. -she gained 6 more lbs since last OV  3. HL -Latest lipid panel showed an LDL above target of less than 55, otherwise fractions at goal: Lab Results  Component Value Date   CHOL 136 10/16/2022   HDL 53.00 10/16/2022   LDLCALC 65 10/16/2022   LDLDIRECT 144.0 09/24/2018   TRIG 94.0 10/16/2022   CHOLHDL 3 10/16/2022  -She continues Lipitor  80 mg daily without side effects  Lela Fendt, MD PhD [facility]ocr[facility]

## 2023-01-23 NOTE — Patient Instructions (Addendum)
 Please continue: - Farxiga 10 mg before b'fast  - R insulin 10-15 units 2-3x a day before meals  Please return in 3 months.

## 2023-02-19 DIAGNOSIS — J209 Acute bronchitis, unspecified: Secondary | ICD-10-CM | POA: Diagnosis not present

## 2023-02-19 DIAGNOSIS — R112 Nausea with vomiting, unspecified: Secondary | ICD-10-CM | POA: Diagnosis not present

## 2023-02-19 DIAGNOSIS — R509 Fever, unspecified: Secondary | ICD-10-CM | POA: Diagnosis not present

## 2023-02-19 DIAGNOSIS — R059 Cough, unspecified: Secondary | ICD-10-CM | POA: Diagnosis not present

## 2023-02-19 DIAGNOSIS — Z03818 Encounter for observation for suspected exposure to other biological agents ruled out: Secondary | ICD-10-CM | POA: Diagnosis not present

## 2023-02-19 DIAGNOSIS — J029 Acute pharyngitis, unspecified: Secondary | ICD-10-CM | POA: Diagnosis not present

## 2023-02-22 ENCOUNTER — Other Ambulatory Visit: Payer: Self-pay

## 2023-02-22 ENCOUNTER — Emergency Department (HOSPITAL_COMMUNITY)
Admission: EM | Admit: 2023-02-22 | Discharge: 2023-02-22 | Disposition: A | Discharge status: Home / Self Care | Payer: Medicare Other | Attending: Emergency Medicine | Admitting: Emergency Medicine

## 2023-02-22 ENCOUNTER — Emergency Department (HOSPITAL_COMMUNITY): Payer: Medicare Other

## 2023-02-22 ENCOUNTER — Encounter (HOSPITAL_COMMUNITY): Payer: Self-pay

## 2023-02-22 DIAGNOSIS — R059 Cough, unspecified: Secondary | ICD-10-CM | POA: Diagnosis not present

## 2023-02-22 DIAGNOSIS — Z8673 Personal history of transient ischemic attack (TIA), and cerebral infarction without residual deficits: Secondary | ICD-10-CM | POA: Insufficient documentation

## 2023-02-22 DIAGNOSIS — R051 Acute cough: Secondary | ICD-10-CM | POA: Diagnosis not present

## 2023-02-22 DIAGNOSIS — I5040 Unspecified combined systolic (congestive) and diastolic (congestive) heart failure: Secondary | ICD-10-CM | POA: Diagnosis not present

## 2023-02-22 DIAGNOSIS — Z7901 Long term (current) use of anticoagulants: Secondary | ICD-10-CM | POA: Diagnosis not present

## 2023-02-22 DIAGNOSIS — Z20822 Contact with and (suspected) exposure to covid-19: Secondary | ICD-10-CM | POA: Diagnosis not present

## 2023-02-22 DIAGNOSIS — E119 Type 2 diabetes mellitus without complications: Secondary | ICD-10-CM | POA: Diagnosis not present

## 2023-02-22 DIAGNOSIS — Z79899 Other long term (current) drug therapy: Secondary | ICD-10-CM | POA: Diagnosis not present

## 2023-02-22 DIAGNOSIS — J101 Influenza due to other identified influenza virus with other respiratory manifestations: Secondary | ICD-10-CM | POA: Insufficient documentation

## 2023-02-22 DIAGNOSIS — R6889 Other general symptoms and signs: Secondary | ICD-10-CM | POA: Diagnosis not present

## 2023-02-22 DIAGNOSIS — R1110 Vomiting, unspecified: Secondary | ICD-10-CM | POA: Diagnosis present

## 2023-02-22 DIAGNOSIS — I110 Hypertensive heart disease with heart failure: Secondary | ICD-10-CM | POA: Insufficient documentation

## 2023-02-22 DIAGNOSIS — R7989 Other specified abnormal findings of blood chemistry: Secondary | ICD-10-CM | POA: Insufficient documentation

## 2023-02-22 DIAGNOSIS — Z794 Long term (current) use of insulin: Secondary | ICD-10-CM | POA: Insufficient documentation

## 2023-02-22 DIAGNOSIS — R112 Nausea with vomiting, unspecified: Secondary | ICD-10-CM | POA: Diagnosis not present

## 2023-02-22 DIAGNOSIS — R0602 Shortness of breath: Secondary | ICD-10-CM | POA: Diagnosis not present

## 2023-02-22 LAB — URINALYSIS, ROUTINE W REFLEX MICROSCOPIC
Bilirubin Urine: NEGATIVE
Glucose, UA: 150 mg/dL — AB
Hgb urine dipstick: NEGATIVE
Ketones, ur: NEGATIVE mg/dL
Leukocytes,Ua: NEGATIVE
Nitrite: NEGATIVE
Protein, ur: NEGATIVE mg/dL
Specific Gravity, Urine: 1.014 (ref 1.005–1.030)
pH: 5 (ref 5.0–8.0)

## 2023-02-22 LAB — CBC
HCT: 45.5 % (ref 36.0–46.0)
Hemoglobin: 14.3 g/dL (ref 12.0–15.0)
MCH: 30.6 pg (ref 26.0–34.0)
MCHC: 31.4 g/dL (ref 30.0–36.0)
MCV: 97.2 fL (ref 80.0–100.0)
Platelets: 129 10*3/uL — ABNORMAL LOW (ref 150–400)
RBC: 4.68 MIL/uL (ref 3.87–5.11)
RDW: 14.1 % (ref 11.5–15.5)
WBC: 4.5 10*3/uL (ref 4.0–10.5)
nRBC: 0 % (ref 0.0–0.2)

## 2023-02-22 LAB — COMPREHENSIVE METABOLIC PANEL
ALT: 21 U/L (ref 0–44)
AST: 28 U/L (ref 15–41)
Albumin: 3.7 g/dL (ref 3.5–5.0)
Alkaline Phosphatase: 45 U/L (ref 38–126)
Anion gap: 13 (ref 5–15)
BUN: 50 mg/dL — ABNORMAL HIGH (ref 8–23)
CO2: 20 mmol/L — ABNORMAL LOW (ref 22–32)
Calcium: 9 mg/dL (ref 8.9–10.3)
Chloride: 108 mmol/L (ref 98–111)
Creatinine, Ser: 1.72 mg/dL — ABNORMAL HIGH (ref 0.44–1.00)
GFR, Estimated: 30 mL/min — ABNORMAL LOW (ref >60)
Glucose, Bld: 110 mg/dL — ABNORMAL HIGH (ref 70–99)
Potassium: 4.3 mmol/L (ref 3.5–5.1)
Sodium: 141 mmol/L (ref 135–145)
Total Bilirubin: 0.8 mg/dL (ref 0.0–1.2)
Total Protein: 6.6 g/dL (ref 6.5–8.1)

## 2023-02-22 LAB — ACETAMINOPHEN LEVEL: Acetaminophen (Tylenol), Serum: <10 ug/mL — ABNORMAL LOW (ref 10–30)

## 2023-02-22 LAB — RESP PANEL BY RT-PCR (RSV, FLU A&B, COVID)  RVPGX2
Influenza A by PCR: POSITIVE — AB
Influenza B by PCR: NEGATIVE
Resp Syncytial Virus by PCR: NEGATIVE
SARS Coronavirus 2 by RT PCR: NEGATIVE

## 2023-02-22 LAB — RAPID URINE DRUG SCREEN, HOSP PERFORMED
Amphetamines: NOT DETECTED
Barbiturates: NOT DETECTED
Benzodiazepines: NOT DETECTED
Cocaine: NOT DETECTED
Opiates: NOT DETECTED
Tetrahydrocannabinol: NOT DETECTED

## 2023-02-22 LAB — LIPASE, BLOOD: Lipase: 28 U/L (ref 11–51)

## 2023-02-22 MED ORDER — ONDANSETRON 4 MG PO TBDP
4.0000 mg | ORAL_TABLET | Freq: Once | ORAL | Status: AC | PRN
  Administered 2023-02-22: 4 mg via ORAL
  Filled 2023-02-22: qty 1

## 2023-02-22 MED ORDER — ONDANSETRON 4 MG PO TBDP: 4.0000 mg | ORAL_TABLET | Freq: Three times a day (TID) | ORAL | 0 refills | Status: AC | PRN

## 2023-02-22 MED ORDER — BENZONATATE 100 MG PO CAPS: 100.0000 mg | ORAL_CAPSULE | Freq: Three times a day (TID) | ORAL | 0 refills | Status: DC

## 2023-02-22 MED ORDER — BENZONATATE 100 MG PO CAPS
100.0000 mg | ORAL_CAPSULE | Freq: Once | ORAL | Status: AC
  Administered 2023-02-22: 100 mg via ORAL
  Filled 2023-02-22: qty 1
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
## 2023-02-22 NOTE — Discharge Instructions (Addendum)
You tested positive for the flu today.  Your COVID, RSV are negative.  Your chest x-ray does not show any pneumonia today.  Your drug screen was negative for any opiates, cocaine, benzodiazepines, amphetamines, THC, or barbiturates  Medications prescribed:   You have been prescribed benzonatate to use as needed for cough. You were given your first dose here, your next dose can be no sooner than midnight tonight  You have been prescribed Zofran to use at home as needed for nausea and vomiting.  You were given your first dose here today.  Your next dose can be no sooner than 8 PM tonight.  Home care instructions:  You may take up to 1000mg  of tylenol every 6 hours as needed for pain.  Do not take more then 4g per day.   For cough: honey 1/2 to 1 teaspoon (you can dilute the honey in water or another fluid).  You can also use guaifenesin and dextromethorphan for cough which are over-the-counter medications. You can use a humidifier for chest congestion and cough.  If you don't have a humidifier, you can sit in the bathroom with the hot shower running.      For sore throat: try warm salt water gargles, cepacol lozenges, throat spray, warm tea or water with lemon/honey, popsicles or ice, or OTC cold relief medicine for throat discomfort.    For congestion: Flonase 1-2 sprays in each nostril daily.    It is important to stay hydrated: drink plenty of fluids (water, gatorade/powerade/pedialyte, juices, or teas) to keep your throat moisturized and help further relieve irritation/discomfort.   Your illness is contagious and can be spread to others, especially during the first 3 or 4 days. It cannot be cured by antibiotics or other medicines. Take basic precautions such as wearing a mask, washing your hands often, covering your mouth when you cough or sneeze, and avoiding public places where you could spread your illness to others.   You may return to normal activities (work/school) when:  - You are  having improvement in symptoms  - AND have had resolution of fever without the use of fever-reducing medications for 24 hours  Follow-up instructions: Please follow-up with your primary care provider for further evaluation of your symptoms if you are not feeling better within the next 5 days.  Return instructions:  Please return to the Emergency Department if you experience worsening symptoms.  RETURN IMMEDIATELY IF you develop shortness of breath, confusion or altered mental status, a new rash, become dizzy, faint, or poorly responsive, or are unable to be cared for at home. Please return if you have persistent vomiting and cannot keep down fluids or develop a fever that is not controlled by tylenol or motrin.   Please return if you have any other emergent concerns.

## 2023-02-22 NOTE — ED Provider Notes (Signed)
[facility] Provider Note   CSN: [id number] Arrival date & time: 02/22/23  1141     History  Chief Complaint  Patient presents with   Emesis    Yolanda Saunders is a 78 y.o. female with history of hypertension, type 2 diabetes, IBS, stroke, paroxysmal A-fib on Eliquis, systolic and diastolic heart failure, presents with concern for ongoing cough ever since she was diagnosed with flu 6 days ago.  She states the cough keeps her up at night.  She is coughing up phlegm, but no blood.  Reports episodic shortness of breath with her coughing episodes.  No chest pain.  Also reports some nausea and decreased appetite.   Emesis      Home Medications Prior to Admission medications   Medication Sig Start Date End Date Taking? Authorizing Provider  benzonatate (TESSALON) 100 MG capsule Take 1 capsule (100 mg total) by mouth every 8 (eight) hours. 02/22/23  Yes Arabella Merles, PA-C  ondansetron (ZOFRAN-ODT) 4 MG disintegrating tablet Take 1 tablet (4 mg total) by mouth every 8 (eight) hours as needed for nausea or vomiting. 02/22/23  Yes Arabella Merles, PA-C  allopurinol (ZYLOPRIM) 100 MG tablet Take 1 tablet by mouth once daily Patient not taking: Reported on 01/23/2023 07/28/22   McCaughan, Dia D, DPM  apixaban (ELIQUIS) 5 MG TABS tablet Take 1 tablet by mouth twice daily 05/19/22   Chilton Si, MD  atorvastatin (LIPITOR) 80 MG tablet Take 1 tablet (80 mg total) by mouth daily. 08/09/20   Elgergawy, Leana Roe, MD  Cephalexin 250 MG tablet Take 250 mg by mouth daily. Daily for UTI prevention. 03/25/22   [provider]  Cholecalciferol (VITAMIN D-3 PO) Take 1 capsule by mouth daily.    [provider]  dapagliflozin propanediol (FARXIGA) 10 MG TABS tablet Take 1 tablet (10 mg total) by mouth daily. 12/24/22   Chilton Si, MD  gabapentin (NEURONTIN) 100 MG capsule Take 100 mg by mouth at bedtime. 07/05/21   [provider]  glucose blood (ONETOUCH VERIO) test strip USE AS DIRECTED TO  CHECK  BLOOD  SUGAR  FOUR  TIMES  DAILY 05/27/22   Carlus Pavlov, MD  insulin glargine (LANTUS) 100 UNIT/ML injection Inject 0.2 mLs (20 Units total) into the skin daily. Patient not taking: Reported on 01/23/2023 10/16/22   Carlus Pavlov, MD  insulin regular (NOVOLIN R) 100 units/mL injection Inject 0.08-0.1 mLs (8-10 Units total) into the skin 2 (two) times daily before a meal. 12/05/22   Carlus Pavlov, MD  pantoprazole (PROTONIX) 40 MG tablet Take 1 tablet (40 mg total) by mouth daily. 08/09/20   Elgergawy, Leana Roe, MD  sacubitril-valsartan (ENTRESTO) 49-51 MG Take 1 tablet by mouth 2 (two) times daily. 12/24/22   Chilton Si, MD  sertraline (ZOLOFT) 50 MG tablet Take 50 mg by mouth daily. 07/05/21   [provider]  spironolactone (ALDACTONE) 25 MG tablet Take 1/2 tablet (12.5 mg total) by mouth daily. 07/31/21   Lynnell Catalan, MD  torsemide (DEMADEX) 20 MG tablet TAKE 1 TABLET BY MOUTH ON MONDAY, WEDNESDAY AND FRIDAY. TAKE AN EXTRA DOSE FOR WEIGHT OVER 160 LBS 12/23/22   Chilton Si, MD  vitamin B-12 (CYANOCOBALAMIN) 1000 MCG tablet Take 1,000 mcg by mouth daily.    [provider]      Allergies    Codeine, Erythromycin, Penicillins, Shellfish allergy, Sulfonamide derivatives, Zestril [lisinopril], Toprol xl [metoprolol], Actos [pioglitazone], Aldactone [spironolactone], Amaryl [glimepiride], Apresoline [hydralazine], Benicar [  olmesartan], Byetta 10 mcg pen [exenatide], Catapres [clonidine hcl], Flexeril [cyclobenzaprine], Glucotrol [glipizide], Glyburide-metformin, Humalog [insulin lispro], Invokana [canagliflozin], Januvia [sitagliptin], Neurontin [gabapentin], Norvasc [amlodipine], Prednisone, Propoxyphene, Reglan [metoclopramide], Septra [sulfamethoxazole-trimethoprim], Trimethoprim, Ultram [tramadol], Victoza [liraglutide], Zocor [simvastatin], Cozaar [losartan potassium], Novolin 70-30  [insulin nph isophane & regular], and Sulfa antibiotics    Review of Systems   Review of Systems  Gastrointestinal:  Positive for vomiting.    Physical Exam Updated Vital Signs BP (!) 145/60 (BP Location: Left Arm)   Pulse 63   Temp 97.9 F (36.6 C) (Oral)   Resp 18   Ht 5\' 3"  (1.6 m)   Wt 86.8 kg   SpO2 100%   BMI 33.90 kg/m  Physical Exam Vitals and nursing note reviewed.  Constitutional:      General: She is not in acute distress.    Appearance: She is well-developed.     Comments: Well-appearing, no vomiting  HENT:     Head: Normocephalic and atraumatic.  Eyes:     Conjunctiva/sclera: Conjunctivae normal.  Cardiovascular:     Rate and Rhythm: Normal rate and regular rhythm.     Heart sounds: No murmur heard. Pulmonary:     Effort: Pulmonary effort is normal. No respiratory distress.     Comments: 100% on room air, talking in full sentences without difficulty  Adventitious lung sounds bilaterally. No wheezing  Abdominal:     Palpations: Abdomen is soft.     Tenderness: There is no abdominal tenderness.  Musculoskeletal:        General: No swelling.     Cervical back: Neck supple.  Skin:    General: Skin is warm and dry.     Capillary Refill: Capillary refill takes less than 2 seconds.  Neurological:     Mental Status: She is alert.  Psychiatric:        Mood and Affect: Mood normal.     ED Results / Procedures / Treatments   Labs (all labs ordered are listed, but only abnormal results are displayed) Labs Reviewed  RESP PANEL BY RT-PCR (RSV, FLU A&B, COVID)  RVPGX2 - Abnormal; Notable for the following components:      Result Value   Influenza A by PCR POSITIVE (*)    All other components within normal limits  COMPREHENSIVE METABOLIC PANEL - Abnormal; Notable for the following components:   CO2 20 (*)    Glucose, Bld 110 (*)    BUN 50 (*)    Creatinine, Ser 1.72 (*)    GFR, Estimated 30 (*)    All other components within normal limits  CBC -  Abnormal; Notable for the following components:   Platelets 129 (*)    All other components within normal limits  ACETAMINOPHEN LEVEL - Abnormal; Notable for the following components:   Acetaminophen (Tylenol), Serum <10 (*)    All other components within normal limits  URINALYSIS, ROUTINE W REFLEX MICROSCOPIC - Abnormal; Notable for the following components:   APPearance HAZY (*)    Glucose, UA 150 (*)    All other components within normal limits  LIPASE, BLOOD  RAPID URINE DRUG SCREEN, HOSP PERFORMED    EKG None  Radiology DG Chest 2 View Result Date: 02/22/2023 CLINICAL DATA:  Cough, shortness of breath, nausea, vomiting, body aches, congestion. Diagnosed with influenza 6 days ago. EXAM: CHEST - 2 VIEW COMPARISON:  07/25/2021 FINDINGS: Chronic deformity left proximal humerus from an old surgical neck fracture. Thoracic spondylosis. Cardiac and mediastinal margins appear normal. No blunting  of the costophrenic angles. The lungs appear clear. IMPRESSION: 1. No acute findings. 2. Thoracic spondylosis. 3. Chronic deformity left proximal humerus from an old surgical neck fracture. Electronically Signed   By: Gaylyn Rong M.D.   On: 02/22/2023 14:22    Procedures Procedures    Medications Ordered in ED Medications  ondansetron (ZOFRAN-ODT) disintegrating tablet 4 mg (4 mg Oral Given 02/22/23 1214)  benzonatate (TESSALON) capsule 100 mg (100 mg Oral Given 02/22/23 1643)    ED Course/ Medical Decision Making/ A&P                                 Medical Decision Making Amount and/or Complexity of Data Reviewed Labs: ordered.  Risk Prescription drug management.     Differential diagnosis includes but is not limited to COVID, flu, RSV, viral URI, strep pharyngitis, viral pharyngitis, allergic rhinitis, pneumonia, bronchitis   ED Course:  Patient very well-appearing, stable vital signs aside from an elevated blood pressure of 155/66 today.  Afebrile, not tachycardic.  100%  on room air, breathing comfortably.  Patient does have a dry cough while I am in the room.  Lungs adventitious sounds bilaterally.  Chest x-ray without any signs of pneumonia.  Suspect cough is secondary to her recently diagnosed flu. I Ordered, and personally interpreted labs.  The pertinent results include: CMP with elevated creatinine at 1.72, but this appears to be at patient baseline.  No elevation LFTs, urinalysis without signs of infection, CBC at patient baseline, no leukocytosis, lipase within normal limits.  No concern for any acute abdominal pathology at this time given labs unremarkable and abdomen soft nontender. Patient was given Zofran for nausea.  Benzonatate given for cough   Upon reevaluation, patient states that Zofran has helped with the nausea.  Eating ice cream and drinking ginger ale.  Patient stable appropriate for discharge home this time  Impression: Influenza A  Disposition:  The patient was discharged home with instructions to take benzonatate as prescribed. Return precautions given.  Imaging Studies ordered: I ordered imaging studies including chest x-ray I independently visualized the imaging with scope of interpretation limited to determining acute life threatening conditions related to emergency care. Imaging showed no acute abnormalities I agree with the radiologist interpretation                Final Clinical Impression(s) / ED Diagnoses Final diagnoses:  Influenza A  Acute cough    Rx / DC Orders ED Discharge Orders          Ordered    benzonatate (TESSALON) 100 MG capsule  Every 8 hours        02/22/23 1656    ondansetron (ZOFRAN-ODT) 4 MG disintegrating tablet  Every 8 hours PRN        02/22/23 1658              Arabella Merles, PA-C 02/22/23 1738    Wynetta Fines, MD 02/22/23 2243

## 2023-02-22 NOTE — ED Triage Notes (Signed)
Patient BIB EMS for nausea, vomiting, body aches, cough, SOB and congestion x 1 week. Was dx with the flu 6 days ago. Continues to have symptoms.  O2 100% on RA.

## 2023-03-09 ENCOUNTER — Ambulatory Visit (HOSPITAL_BASED_OUTPATIENT_CLINIC_OR_DEPARTMENT_OTHER): Payer: Medicare Other | Admitting: Family

## 2023-03-09 ENCOUNTER — Telehealth (HOSPITAL_BASED_OUTPATIENT_CLINIC_OR_DEPARTMENT_OTHER): Payer: Self-pay

## 2023-03-09 VITALS — BP 116/62 | HR 68 | Ht 63.0 in | Wt 181.9 lb

## 2023-03-09 DIAGNOSIS — I5042 Chronic combined systolic (congestive) and diastolic (congestive) heart failure: Secondary | ICD-10-CM

## 2023-03-09 DIAGNOSIS — E785 Hyperlipidemia, unspecified: Secondary | ICD-10-CM | POA: Diagnosis not present

## 2023-03-09 DIAGNOSIS — I10 Essential (primary) hypertension: Secondary | ICD-10-CM

## 2023-03-09 DIAGNOSIS — I480 Paroxysmal atrial fibrillation: Secondary | ICD-10-CM | POA: Diagnosis not present

## 2023-03-09 DIAGNOSIS — I25118 Atherosclerotic heart disease of native coronary artery with other forms of angina pectoris: Secondary | ICD-10-CM | POA: Diagnosis not present

## 2023-03-09 DIAGNOSIS — D6859 Other primary thrombophilia: Secondary | ICD-10-CM

## 2023-03-09 MED ORDER — TORSEMIDE 20 MG PO TABS: ORAL_TABLET | ORAL | 3 refills | Status: DC

## 2023-03-09 NOTE — Telephone Encounter (Signed)
 Fax request sent to PCP requesting recent lab works.

## 2023-03-09 NOTE — Progress Notes (Unsigned)
 Cardiology Office Note:  .   Date:  03/11/2023  ID:  DYNVER CLEMSON, DOB 11/05/1945, MRN [id number] PCP: Noberto Retort, MD  [facility] Providers Cardiologist:  Chilton Si, MD Electrophysiologist:  Lanier Prude, MD    History of Present Illness: .   Yolanda Saunders is a 78 y.o. female with a hx of HTN, DM2, PAF, HLD, CKD3, embolic stroke, COVID 19, HFrEF, CAD, back pain.   Admitted 08/02/20 with acute CVA. CTA head and neck with acute right P2 occlusion with severe 75% stenosis in right PCA with severe distal left P2 stenosis. MRI brain with moderate size acute right PCA distribution infarct, underlying moderate chronic microvascular ischemic disease, few scattered remote lacunar infarct in basal ganglia and thalamus. Echo during admission LVEF 35-40%. Tested positive for COVID19 during admission and treated with remdesivir. Underwent cardiac cath for evaluation of reduced LVEF with CTO of mid LAD with left to right and right to left collaterals, moderate diffuse atherosclerosis. She was discharged on low dose Carvedilol and Avapro. Noted ot have multiple medication intolerances. Discharged with 30 day monitor as not loop candidate. She was recommended for outpatient cardiac MRI to rule out LV thrombus. LDL during admission 221, suspected not to be taking Lipitor at home and dose was increased from 40mg  to 80mg .    Seen in follow up 09/07/20 while wearing 30 day monitor which ended up revealing PAF and Eliquis was initiated. Cardiac MRI was ordered and performed 10/2020 showing LVEF 38%, mildly increased left ventricular size, late gadolinium enhancement in the myocardium suggestive of infarction, no LV thrombus, RV normal size and function.   Admitted 07/2021 with acute on chronic systolic heart failure hypoxic respiratory failure.  Initially required BiPAP and subsequently intubation.  Echo revealed further drop in LVEF 20-25% global hypokinesis, grade 2 diastolic  dysfunction, mildly reduced RV function.  She was discharged on Entresto and spironolactone.  Carvedilol held due to bradycardia.  It was noted by Dr. Prescott Gum review that doubted any role for attempted revascularization is unlikely to improve LVEF and no anginal symptoms.   Updated echo 11/20/2021 with improved LVEF 70 to 75%, mild LVH, grade 1 diastolic dysfunction, no RWMA, elevated LVEDP, RV normal, mild AI, mild dilation ascending aorta 38 mm.  Still has some residual cough, exertional dyspnea after influeza A 02/22/23 but gradually recovering. Recently moved to [place] and participating in various activities. No chest pain, edema, orthopnea, PND.   ROS: Please see the history of present illness.    All other systems reviewed and are negative.   Studies Reviewed: .        Cardiac Studies & Procedures   ______________________________________________________________________________________________ CARDIAC CATHETERIZATION  CARDIAC CATHETERIZATION 08/07/2020  Narrative   Mid LAD lesion is 100% stenosed.   2nd Diag lesion is 100% stenosed.  Total occlusion of the proximal to mid LAD with left to left and right to left collaterals.  LAD does not wraparound apex.  The first diagonal is large and also fills by collaterals. Irregularities with up to 50% in mid circumflex.  The very distal third obtuse marginal contains segmental 70% stenosis. Left main is widely patent RCA has diffuse 60% mid to distal narrowing after the first acute marginal ostium. Right heart pressures are normal LVEDP is normal  RECOMMENDATIONS: Consider MRI to rule out apical thrombus versus empirical long-term anticoagulation therapy for presumed LV thrombus with embolic CVA.  Findings Coronary Findings Diagnostic  Dominance: Right  Left Anterior Descending Vessel is  small. The vessel exhibits minimal luminal irregularities. Collaterals Dist LAD filled by collaterals from RPDA.  Mid LAD lesion is  100% stenosed.  First Diagonal Branch  First Septal Branch Vessel is small in size.  Second Diagonal Branch Vessel is small in size. Collaterals 2nd Diag filled by collaterals from 3rd Mrg.  2nd Diag lesion is 100% stenosed.  Second Septal Branch  Ramus Intermedius Vessel is small.  Left Circumflex The vessel exhibits minimal luminal irregularities. Mid Cx lesion is 40% stenosed.  First Obtuse Marginal Branch Vessel is small in size.  Second Obtuse Marginal Branch Vessel is small in size.  Third Obtuse Marginal Branch 3rd Mrg lesion is 70% stenosed.  Right Coronary Artery The vessel exhibits minimal luminal irregularities. Prox RCA to Mid RCA lesion is 60% stenosed.  Intervention  No interventions have been documented.   STRESS TESTS  MYOCARDIAL PERFUSION IMAGING 10/27/2016  Narrative  Nuclear stress EF: 58%.  There was no ST segment deviation noted during stress.  Defect 1: There is a medium defect of moderate severity present in the mid anteroseptal, apical septal and apex location.  This is a low risk study.  The left ventricular ejection fraction is normal (55-65%).  Low risk, probably normal stress nuclear study with septal/apical defect possibly related to conduction abnormality; no ischemia; EF 58 with normal wall motion.   ECHOCARDIOGRAM  ECHOCARDIOGRAM COMPLETE 11/20/2021  Narrative ECHOCARDIOGRAM REPORT    Patient Name:   Yolanda Saunders Date of Exam: 11/20/2021 Medical Rec #:  [id number]         Height:       62.0 in Accession #:    [id number]        Weight:       166.8 lb Date of Birth:  May 27, 1945         BSA:          1.770 m Patient Age:    76 years          BP:           102/76 mmHg Patient Gender: F                 HR:           66 bpm. Exam Location:  Outpatient  Procedure: 2D Echo, 3D Echo, Cardiac Doppler, Color Doppler and Strain Analysis  Indications:    I50.23 CHF (HFrEF)  History:        Patient has prior history of  Echocardiogram examinations, most recent 07/25/2021. CHF, CAD, Stroke, Arrythmias:Atrial Fibrillation, Signs/Symptoms:Edema; Risk Factors:Family History of Coronary Artery Disease, Hypertension, Diabetes, Dyslipidemia and Former Smoker. Heart Failure with Reduced EF (prior 20-25%).  Sonographer:    Farrel Conners RDCS Referring Phys: [id number] TIFFANY [place]  IMPRESSIONS   1. Left ventricular ejection fraction, by estimation, is 70 to 75%. The left ventricle has hyperdynamic function. The left ventricle has no regional wall motion abnormalities. There is mild left ventricular hypertrophy of the basal-septal segment. Left ventricular diastolic parameters are consistent with Grade I diastolic dysfunction (impaired relaxation). Elevated left ventricular end-diastolic pressure. 2. Right ventricular systolic function is normal. The right ventricular size is normal. There is normal pulmonary artery systolic pressure. 3. Left atrial size was mild to moderately dilated. 4. The mitral valve is normal in structure. Trivial mitral valve regurgitation. No evidence of mitral stenosis. 5. The aortic valve is normal in structure. Aortic valve regurgitation is mild. No aortic stenosis is present. 6. Aortic dilatation noted. There is mild dilatation  of the ascending aorta, measuring 38 mm. 7. The inferior vena cava is normal in size with greater than 50% respiratory variability, suggesting right atrial pressure of 3 mmHg.  FINDINGS Left Ventricle: Left ventricular ejection fraction, by estimation, is 70 to 75%. The left ventricle has hyperdynamic function. The left ventricle has no regional wall motion abnormalities. The left ventricular internal cavity size was normal in size. There is mild left ventricular hypertrophy of the basal-septal segment. Left ventricular diastolic parameters are consistent with Grade I diastolic dysfunction (impaired relaxation). Elevated left ventricular end-diastolic  pressure.  Right Ventricle: The right ventricular size is normal. No increase in right ventricular wall thickness. Right ventricular systolic function is normal. There is normal pulmonary artery systolic pressure. The tricuspid regurgitant velocity is 2.24 m/s, and with an assumed right atrial pressure of 3 mmHg, the estimated right ventricular systolic pressure is 23.1 mmHg.  Left Atrium: Left atrial size was mild to moderately dilated.  Right Atrium: Right atrial size was normal in size.  Pericardium: There is no evidence of pericardial effusion.  Mitral Valve: The mitral valve is normal in structure. Trivial mitral valve regurgitation. No evidence of mitral valve stenosis.  Tricuspid Valve: The tricuspid valve is normal in structure. Tricuspid valve regurgitation is trivial. No evidence of tricuspid stenosis.  Aortic Valve: The aortic valve is normal in structure. Aortic valve regurgitation is mild. Aortic regurgitation PHT measures 443 msec. No aortic stenosis is present.  Pulmonic Valve: The pulmonic valve was normal in structure. Pulmonic valve regurgitation is not visualized. No evidence of pulmonic stenosis.  Aorta: Aortic dilatation noted. There is mild dilatation of the ascending aorta, measuring 38 mm.  Venous: The inferior vena cava is normal in size with greater than 50% respiratory variability, suggesting right atrial pressure of 3 mmHg.  IAS/Shunts: No atrial level shunt detected by color flow Doppler.   LEFT VENTRICLE PLAX 2D LVIDd:         4.30 cm   Diastology LVIDs:         2.20 cm   LV e' medial:    4.57 cm/s LV PW:         0.80 cm   LV E/e' medial:  19.8 LV IVS:        1.20 cm   LV e' lateral:   6.53 cm/s LVOT diam:     2.30 cm   LV E/e' lateral: 13.9 LV SV:         88 LV SV Index:   50        2D Longitudinal Strain LVOT Area:     4.15 cm  2D Strain GLS (A2C):   -17.9 % 2D Strain GLS (A3C):   -21.2 % 2D Strain GLS (A4C):   -22.5 % 2D Strain GLS Avg:      -20.5 %  3D Volume EF: 3D EF:        65 % LV EDV:       125 ml LV ESV:       44 ml LV SV:        81 ml  RIGHT VENTRICLE RV Basal diam:  3.10 cm RV S prime:     14.60 cm/s TAPSE (M-mode): 2.0 cm  LEFT ATRIUM             Index        RIGHT ATRIUM           Index LA diam:        4.10 cm 2.32  cm/m   RA Area:     16.60 cm LA Vol (A2C):   52.8 ml 29.83 ml/m  RA Volume:   39.70 ml  22.43 ml/m LA Vol (A4C):   65.6 ml 37.07 ml/m LA Biplane Vol: 59.9 ml 33.85 ml/m AORTIC VALVE LVOT Vmax:   84.90 cm/s LVOT Vmean:  58.850 cm/s LVOT VTI:    0.213 m AI PHT:      443 msec  AORTA Ao Root diam: 3.40 cm Ao Asc diam:  3.80 cm  MITRAL VALVE               TRICUSPID VALVE MV Area (PHT): cm         TR Peak grad:   20.1 mmHg MV Decel Time: 233 msec    TR Vmax:        224.00 cm/s MV E velocity: 90.65 cm/s MV A velocity: 95.35 cm/s  SHUNTS MV E/A ratio:  0.95        Systemic VTI:  0.21 m Systemic Diam: 2.30 cm  Chilton Si MD Electronically signed by Chilton Si MD Signature Date/Time: 11/20/2021/4:35:56 PM    Final    MONITORS  LONG TERM MONITOR (3-14 DAYS) 09/25/2022  Narrative Patch Wear Time:  7 days and 5 hours (2024-08-21T12:19:27-0400 to 2024-08-28T17:26:46-0400)  1. Sinus - Sinus rhythm avg HR of 68 bpm. 2. One run of non-sustained Ventricular Tachycardia occurred lasting 5 beats with a max rate of 162 bpm (avg 145 bpm). 3 Isolated PACs were occasional (1.4%, 9420) 4. Rare PVCs 5. 32 patient-triggered events associated with sinus rhythm or isolated PACs  Arvilla Meres, MD 9:22 PM     CARDIAC MRI  MR CARDIAC MORPHOLOGY W WO CONTRAST 11/08/2020  Narrative CLINICAL DATA:  Clinical question of LV thrombus  EXAM: CARDIAC MRI  TECHNIQUE: The patient was scanned on a 1.5 Tesla GE magnet. A dedicated cardiac coil was used. Functional imaging was done using Fiesta sequences. 2,3, and 4 chamber views were done to assess for RWMA's. Modified Simpson's  rule using a short axis stack was used to calculate an ejection fraction on a dedicated work [employer]. The patient received 10 cc of Gadavist. After 10 minutes inversion recovery sequences were used to assess for infiltration and scar tissue.  CONTRAST:  10 cc  of Gadavist  FINDINGS: 1. Mildly increased left ventricular size, with LVEDD 52 mm, but LVEDVi 95 mL/m2.  Normal left ventricular thickness, with intraventricular septal thickness of 8 mm, posterior wall thickness of 6 mm, and myocardial mass index of 51 g/m2.  Moderately reduced left ventricular systolic function (LVEF = 38%). There are regional wall motion abnormalities: There is hypokinesis of the mid anterior, anteroseptal, inferoseptal, and inferior segments. There is akinesis of the apex and all apical segments.  Left ventricular parametric mapping notable for normal T2 signal mild increase in the mid LV segments (30-40%) and significant increase in the apex (46-54%) .  There is late gadolinium enhancement in the left ventricular myocardium: There is apical inferior 75 % LGE suggestive of infarction. There is apical anterior septum and anterior remodeling and thinning.  No LV thrombus noted.  2. Normal right ventricular size with RVEDVI 65 mL/m2.  Normal right ventricular thickness.  Normal right ventricular systolic function (RVEF =56%). There are no regional wall motion abnormalities or aneurysms.  3.  Normal left and right atrial size.  4. Normal size of the aortic root, ascending aorta and pulmonary artery.  5.  Qualitatively no significant valvular  abnormalities.  6.  Normal pericardium.  No pericardial effusion.  7. Grossly, no extracardiac findings. Recommended dedicated study if concerned for non-cardiac pathology.  IMPRESSION: Ischemic Cardiomyopathy and evidence of LAD infarct. No LV thrombus noted.  Riley Lam MD   Electronically Signed By: Riley Lam M.D. On: 11/08/2020 22:29   ______________________________________________________________________________________________      Risk Assessment/Calculations:    CHA2DS2-VASc Score = 9   This indicates a 12.2% annual risk of stroke. The patient's score is based upon: CHF History: 1 HTN History: 1 Diabetes History: 1 Stroke History: 2 Vascular Disease History: 1 Age Score: 2 Gender Score: 1           Physical Exam:   VS:  BP 116/62   Pulse 68   Ht 5\' 3"  (1.6 m)   Wt 181 lb 14.4 oz (82.5 kg)   SpO2 98%   BMI 32.22 kg/m    Wt Readings from Last 3 Encounters:  03/09/23 181 lb 14.4 oz (82.5 kg)  02/22/23 191 lb 5.8 oz (86.8 kg)  01/23/23 191 lb 6.4 oz (86.8 kg)    GEN: Well nourished, well developed in no acute distress NECK: No JVD; No carotid bruits CARDIAC: RRR, no murmurs, rubs, gallops RESPIRATORY:  Clear to auscultation without rales, wheezing or rhonchi  ABDOMEN: Soft, non-tender, non-distended EXTREMITIES:  No edema; No deformity   ASSESSMENT AND PLAN: .   Combined systolic and diastolic heart failure /ischemic cardiomyopathy-  Cardiac MRI 10/2020 with no thrombus and LVEF 38%. Echo 07/21/2021 LVEF 20 to 25%. Echo 11/20/2021 LVEF improved to 70 to 75%. LVEF now recovered. NYHA II. Previous intolerance to Lisinopril, Spironolactone, Losartan, Amlodipine, Metoprolol per patient report. Low-sodium diet, fluid restriction less than 2 L encouraged.  GDMT includes Farxiga 10mg  daily, Entresto 49-51mg  BID, Spironolactone 25mg  daily, Torsemide 20mg  M/W/F and 10mg  Sunday. Previously had ringing in her ears on Carvedilol.    CAD - Stable with no anginal symptoms.  No indication for ischemic evaluation.  GDMT includes  atorvastatin 80mg  daily. No ASA due to [facility]. Heart healthy diet and regular cardiovascular exercise encouraged.     History of CVA / PAF / Hypercoagulable state -Not requiring AV nodal blocking agent. Continue Eliquis 5mg  BID. Does not meet dose  reduction criteria at this time, will require careful monitoring of creatinine after her [birth year] in [birth year]. Denies bleeding complications, palpitations. 02/22/23 Hb 14.3, creatinine 1.72.     DM2 - Continue to follow with PCP.    HTN - BP well controlled. Continue current antihypertensive regimen.  Discussed to monitor BP at home at least 2 hours after medications and sitting for 5-10 minutes.    HLD, LDL goal <70 -Continue atorvastatin 80 mg daily.         Dispo: follow up with [facility] HF clinic in 2-3 mos and Dr. Duke Salvia or Alver Sorrow, NP in 6 months  Signed, Alver Sorrow, NP

## 2023-03-09 NOTE — Patient Instructions (Addendum)
 Medication Instructions:  Your physician recommends that you continue on your current medications as directed. Please refer to the Current Medication list given to you today.  Follow-Up: At [facility], you and your health needs are our priority.  As part of our continuing mission to provide you with exceptional heart care, we have created designated Provider Care Teams.  These Care Teams include your primary Cardiologist (physician) and Advanced Practice Providers (APPs -  Physician Assistants and Nurse Practitioners) who all work together to provide you with the care you need, when you need it.  We recommend signing up for the patient portal called "MyChart".  Sign up information is provided on this After Visit Summary.  MyChart is used to connect with patients for Virtual Visits (Telemedicine).  Patients are able to view lab/test results, encounter notes, upcoming appointments, etc.  Non-urgent messages can be sent to your provider as well.   To learn more about what you can do with MyChart, go to [email or web address].    Your next appointment:   Follow up with Heart Failure Clinic in 2-3 months. Follow up with Dr. Duke Salvia or Gillian Shields, NP in 6 months.   Other Instructions    Call our office at [phone] (Heart Failure Clinic).

## 2023-03-11 ENCOUNTER — Encounter (HOSPITAL_BASED_OUTPATIENT_CLINIC_OR_DEPARTMENT_OTHER): Payer: Self-pay | Admitting: Family

## 2023-04-24 ENCOUNTER — Ambulatory Visit: Payer: Medicare Other | Admitting: Internal Medicine

## 2023-05-04 ENCOUNTER — Ambulatory Visit: Admitting: Internal Medicine

## 2023-05-26 DIAGNOSIS — N183 Chronic kidney disease, stage 3 unspecified: Secondary | ICD-10-CM | POA: Diagnosis not present

## 2023-05-26 DIAGNOSIS — K219 Gastro-esophageal reflux disease without esophagitis: Secondary | ICD-10-CM | POA: Diagnosis not present

## 2023-05-26 DIAGNOSIS — I10 Essential (primary) hypertension: Secondary | ICD-10-CM | POA: Diagnosis not present

## 2023-05-26 DIAGNOSIS — I509 Heart failure, unspecified: Secondary | ICD-10-CM | POA: Diagnosis not present

## 2023-05-26 DIAGNOSIS — N390 Urinary tract infection, site not specified: Secondary | ICD-10-CM | POA: Diagnosis not present

## 2023-05-26 DIAGNOSIS — E7800 Pure hypercholesterolemia, unspecified: Secondary | ICD-10-CM | POA: Diagnosis not present

## 2023-05-26 DIAGNOSIS — Z0000 Encounter for general adult medical examination without abnormal findings: Secondary | ICD-10-CM | POA: Diagnosis not present

## 2023-05-26 DIAGNOSIS — G894 Chronic pain syndrome: Secondary | ICD-10-CM | POA: Diagnosis not present

## 2023-05-26 DIAGNOSIS — I480 Paroxysmal atrial fibrillation: Secondary | ICD-10-CM | POA: Diagnosis not present

## 2023-05-26 DIAGNOSIS — D6869 Other thrombophilia: Secondary | ICD-10-CM | POA: Diagnosis not present

## 2023-05-26 DIAGNOSIS — E1165 Type 2 diabetes mellitus with hyperglycemia: Secondary | ICD-10-CM | POA: Diagnosis not present

## 2023-05-27 ENCOUNTER — Ambulatory Visit (HOSPITAL_COMMUNITY)
Admission: RE | Admit: 2023-05-27 | Discharge: 2023-05-27 | Disposition: A | Discharge status: Home / Self Care | Source: Ambulatory Visit | Attending: Internal Medicine | Admitting: Internal Medicine

## 2023-05-27 VITALS — BP 124/70 | HR 67 | Ht 63.0 in | Wt 188.2 lb

## 2023-05-27 DIAGNOSIS — Z79899 Other long term (current) drug therapy: Secondary | ICD-10-CM | POA: Insufficient documentation

## 2023-05-27 DIAGNOSIS — E1122 Type 2 diabetes mellitus with diabetic chronic kidney disease: Secondary | ICD-10-CM | POA: Insufficient documentation

## 2023-05-27 DIAGNOSIS — G5603 Carpal tunnel syndrome, bilateral upper limbs: Secondary | ICD-10-CM | POA: Diagnosis not present

## 2023-05-27 DIAGNOSIS — Z833 Family history of diabetes mellitus: Secondary | ICD-10-CM | POA: Diagnosis not present

## 2023-05-27 DIAGNOSIS — R002 Palpitations: Secondary | ICD-10-CM | POA: Diagnosis not present

## 2023-05-27 DIAGNOSIS — M4800 Spinal stenosis, site unspecified: Secondary | ICD-10-CM | POA: Diagnosis not present

## 2023-05-27 DIAGNOSIS — I25118 Atherosclerotic heart disease of native coronary artery with other forms of angina pectoris: Secondary | ICD-10-CM | POA: Diagnosis not present

## 2023-05-27 DIAGNOSIS — N1832 Chronic kidney disease, stage 3b: Secondary | ICD-10-CM | POA: Diagnosis not present

## 2023-05-27 DIAGNOSIS — N1831 Chronic kidney disease, stage 3a: Secondary | ICD-10-CM | POA: Diagnosis not present

## 2023-05-27 DIAGNOSIS — Z7901 Long term (current) use of anticoagulants: Secondary | ICD-10-CM | POA: Diagnosis not present

## 2023-05-27 DIAGNOSIS — I428 Other cardiomyopathies: Secondary | ICD-10-CM | POA: Insufficient documentation

## 2023-05-27 DIAGNOSIS — Z8249 Family history of ischemic heart disease and other diseases of the circulatory system: Secondary | ICD-10-CM | POA: Diagnosis not present

## 2023-05-27 DIAGNOSIS — I480 Paroxysmal atrial fibrillation: Secondary | ICD-10-CM | POA: Insufficient documentation

## 2023-05-27 DIAGNOSIS — Z7984 Long term (current) use of oral hypoglycemic drugs: Secondary | ICD-10-CM | POA: Diagnosis not present

## 2023-05-27 DIAGNOSIS — Z794 Long term (current) use of insulin: Secondary | ICD-10-CM | POA: Diagnosis not present

## 2023-05-27 DIAGNOSIS — I5042 Chronic combined systolic (congestive) and diastolic (congestive) heart failure: Secondary | ICD-10-CM | POA: Insufficient documentation

## 2023-05-27 DIAGNOSIS — I130 Hypertensive heart and chronic kidney disease with heart failure and stage 1 through stage 4 chronic kidney disease, or unspecified chronic kidney disease: Secondary | ICD-10-CM | POA: Diagnosis not present

## 2023-05-27 DIAGNOSIS — Z8673 Personal history of transient ischemic attack (TIA), and cerebral infarction without residual deficits: Secondary | ICD-10-CM | POA: Diagnosis not present

## 2023-05-27 DIAGNOSIS — I2510 Atherosclerotic heart disease of native coronary artery without angina pectoris: Secondary | ICD-10-CM | POA: Insufficient documentation

## 2023-05-27 LAB — BASIC METABOLIC PANEL WITH GFR
Anion gap: 10 (ref 5–15)
BUN: 31 mg/dL — ABNORMAL HIGH (ref 8–23)
CO2: 29 mmol/L (ref 22–32)
Calcium: 10 mg/dL (ref 8.9–10.3)
Chloride: 104 mmol/L (ref 98–111)
Creatinine, Ser: 1.49 mg/dL — ABNORMAL HIGH (ref 0.44–1.00)
GFR, Estimated: 36 mL/min — ABNORMAL LOW (ref >60)
Glucose, Bld: 84 mg/dL (ref 70–99)
Potassium: 3.8 mmol/L (ref 3.5–5.1)
Sodium: 143 mmol/L (ref 135–145)

## 2023-05-27 LAB — CBC
HCT: 42.6 % (ref 36.0–46.0)
Hemoglobin: 13.3 g/dL (ref 12.0–15.0)
MCH: 29.8 pg (ref 26.0–34.0)
MCHC: 31.2 g/dL (ref 30.0–36.0)
MCV: 95.3 fL (ref 80.0–100.0)
Platelets: 183 10*3/uL (ref 150–400)
RBC: 4.47 MIL/uL (ref 3.87–5.11)
RDW: 13.8 % (ref 11.5–15.5)
WBC: 6.5 10*3/uL (ref 4.0–10.5)
nRBC: 0 % (ref 0.0–0.2)

## 2023-05-27 LAB — BRAIN NATRIURETIC PEPTIDE: B Natriuretic Peptide: 129.3 pg/mL — ABNORMAL HIGH (ref 0.0–100.0)

## 2023-05-27 MED ORDER — TORSEMIDE 20 MG PO TABS
20.0000 mg | ORAL_TABLET | Freq: Every day | ORAL | 5 refills | Status: AC
Start: 2023-05-27 — End: ?

## 2023-05-27 NOTE — Progress Notes (Signed)
 ADVANCED HF CLINIC NOTE  PCP: Dr Raquel Cables  Primary Cardiologist: Dr Theodis Fiscal HF MD: Dr Theodis Fiscal   Reason for Visit: Heart Failure Follow-up HPI: Yolanda Saunders is a 78 y/o female w/ chronic combined systolic due to iCM, CAD, Stage III CKD, Type 2DM, HTN, HLD, h/o stroke and bradycardia.  Admitted 7/22 with large right PCA infarct wiith 75% stenosis of right PCA + severe distal left P2 stenosis.  Brain MRI rain confirmed right PCA infarct and underlying chronic microvascular disease. Echo EF 35-40%.  She also had COVID-19. LHC 100% occlusion of mid LAD and D2.  She had L->L & R-> L collaterals.  RCA 60%.  She was discharged with a 30-day monitor which showed PAF. cMRI .10/22  LVEF 38% with LGE consistent with prior infarction. There was no LV thrombus or evidence of infiltrative process   Admitted 7/23 w/ a/c systolic CHF and hypoxic respiratory failure. She required ntubation. COVID negative. Denied CP. Hs trop 68>>65. Echo revealed further drop LVEF 20-25% and global HK. G2DD and mild RV HK. Diuresed. GDMT titrated   [facility]. BB stopped due to fatigue/bradycardia, entresto  increased, and torsemide  was switched to as needed. She took  tosemide 5 mg 8/4 and 8/5 and 10 mg on 8/7, 8/8, 8/9.    Echo 11/23 EF 70-75%  Last seen in [facility] in 08/2022. Doing well. Living in independent living facility.  She returns today for HF follow up with niece. Overall feeling well. NYHA I-II. Reports persistent lower extremity edema. Niece manages her pill box and usually puts an extra Torsemide  in per week. Reports that she has vertigo since stroke, however does not want to participate in physical therapy. Gets around well enough. Denies chest pain, dyspnea, fatigue, near-syncope, orthopnea, palpitations, dizziness, and abnormal bleeding. Able to perform ADLs. Sleeping well. Appetite okay. Does not take her weight, as she does not want to know. Compliant with all  medications.  Cardiac Studies   Echo 11/2021 LV 70-75% RV normal.   2D Echo 07/25/21 There is mild basilar sparing of left ventricular contractile performance. No thrombus (Definity  contrast). Left ventricular ejection fraction, by estimation, is 20 to 25%. The left ventricle has severely decreased function. The left ventricle demonstrates global hypokinesis. The left ventricular internal cavity size was mildly dilated. Left ventricular diastolic parameters are consistent with Grade II diastolic dysfunction (pseudonormalization). 1. Right ventricular systolic function is mildly reduced. The right ventricular size is normal. There is normal pulmonary artery systolic pressure. 2. 3. Left atrial size was severely dilated. A small pericardial effusion is present. The pericardial effusion is anterior to the right ventricle. There is no evidence of cardiac tamponade. 4. The mitral valve is normal in structure. Mild mitral valve regurgitation. No evidence of mitral stenosis. 5. 6. Tricuspid valve regurgitation is moderate. The aortic valve is normal in structure. Aortic valve regurgitation is mild. No aortic stenosis is present. 7. The inferior vena cava is normal in size with greater than 50% respiratory variability, suggesting right atrial pressure of 3 mmHg.   LHC 07/2020 RA 4 PA 33/15 (22) , PCWP 13, CO 7.2 CI 3.4    Mid LAD lesion is 100% stenosed.   2nd Diag lesion is 100% stenosed.   Total occlusion of the proximal to mid LAD with left to left and right to left collaterals.  LAD does not wraparound apex.  The first diagonal is large and also fills by collaterals. Irregularities with up to 50%  in mid circumflex.  The very distal third obtuse marginal contains segmental 70% stenosis. Left main is widely patent RCA has diffuse 60% mid to distal narrowing after the first acute marginal ostium. Right heart pressures are normal LVEDP is normal   Diagnostic Dominance:  Right  Intervention         Cardiac MRI 10/2020 FINDINGS: 1. Mildly increased left ventricular size, with LVEDD 52 mm, but LVEDVi 95 mL/m2.   Normal left ventricular thickness, with intraventricular septal thickness of 8 mm, posterior wall thickness of 6 mm, and myocardial mass index of 51 g/m2.   Moderately reduced left ventricular systolic function (LVEF = 38%). There are regional wall motion abnormalities: There is hypokinesis of the mid anterior, anteroseptal, inferoseptal, and inferior segments. There is akinesis of the apex and all apical segments.   Left ventricular parametric mapping notable for normal T2 signal mild increase in the mid LV segments (30-40%) and significant increase in the apex (46-54%) .   There is late gadolinium enhancement in the left ventricular myocardium: There is apical inferior 75 % LGE suggestive of infarction. There is apical anterior septum and anterior remodeling and thinning.   No LV thrombus noted.   2. Normal right ventricular size with RVEDVI 65 mL/m2.   Normal right ventricular thickness.   Normal right ventricular systolic function (RVEF =56%). There are no regional wall motion abnormalities or aneurysms.   3.  Normal left and right atrial size.   4. Normal size of the aortic root, ascending aorta and pulmonary artery.   5.  Qualitatively no significant valvular abnormalities.   6.  Normal pericardium.  No pericardial effusion.   7. Grossly, no extracardiac findings. Recommended dedicated study if concerned for non-cardiac pathology.   IMPRESSION: Ischemic Cardiomyopathy and evidence of LAD infarct. No LV thrombus noted.  ROS: All systems negative except as listed in HPI, PMH and Problem List.  SH:  Social History   Socioeconomic History   Marital status: Widowed    Spouse name: Not on file   Number of children: 2   Years of education: Not on file   Highest education level: Not on file  Occupational History    Occupation: retired  Tobacco Use   Smoking status: Former    Current packs/day: 0.00    Average packs/day: 0.8 packs/day for 6.0 years (4.5 ttl pk-yrs)    Types: Cigarettes    Start date: 01/20/1974    Quit date: 01/21/1980    Years since quitting: 43.3   Smokeless tobacco: Never  Vaping Use   Vaping status: Not on file  Substance and Sexual Activity   Alcohol use: No    Alcohol/week: 0.0 standard drinks of alcohol   Drug use: No   Sexual activity: Not Currently  Other Topics Concern   Not on file  Social History Narrative   Lives with husband in a one story home.  Has 2 children.  Retired from [employer].  Education: 12th grade.  Trade schools.    Social Drivers of Corporate investment banker Strain: Low Risk  (07/29/2021)   Overall Financial Resource Strain (CARDIA)    Difficulty of Paying Living Expenses: Not hard at all  Food Insecurity: No Food Insecurity (07/29/2021)   Hunger Vital Sign    Worried About Running Out of Food in the Last Year: Never true    Ran Out of Food in the Last Year: Never true  Transportation Needs: No Transportation Needs (07/29/2021)   PRAPARE - Transportation  Lack of Transportation (Medical): No    Lack of Transportation (Non-Medical): No  Physical Activity: Not on file  Stress: Not on file  Social Connections: Not on file  Intimate Partner Violence: Not on file    FH:  Family History  Problem Relation Age of Onset   Coronary artery disease Father    Diabetes Mellitus I Father    CVA Mother    Stroke Mother    Diabetes Mellitus I Mother    Diabetes Mellitus I Sister    Diabetes Mellitus I Brother    Diabetes Mellitus I Maternal Grandmother    Diabetes Mellitus I Maternal Grandfather    Diabetes Mellitus I Paternal Grandmother    Diabetes Mellitus I Paternal Grandfather    Diabetes Mellitus I Brother    Aortic aneurysm Son     Past Medical History:  Diagnosis Date   Anemia 04/17/11   "long, long years ago"   Angina 04/16/11    "that's what I'm here for"   Anxiety    Arthritis    Hips and knees   Asthma    CAD in native artery 08/07/2021   Carpal tunnel syndrome    Cataracts, bilateral    Chronic combined systolic and diastolic heart failure (HCC) 08/07/2020   CKD (chronic kidney disease), stage III (HCC)    Complication of anesthesia 1987   "affected my eyes; couldn't see anything but blurrs even the next day"   CVA (cerebral infarction)    After cardiac catheter 02/2000   Depression    Edema    Fibromyalgia    GERD (gastroesophageal reflux disease)    Headache(784.0)    High cholesterol    History of bronchitis    Hypertension    IBS (irritable bowel syndrome)    Migraines    PAF (paroxysmal atrial fibrillation) (HCC) 08/07/2021   Panic attacks 04/17/11   "don't take anything for it"   Pneumonia 04/17/11   "probably as many as 7 times"   Renal disorder 04/17/11   "they are working at 60% capacity"   Shortness of breath on exertion    "cause of my asthma"   Stroke (HCC) 2002   residual "problem w/using the right word, left 5 lesions on my brain/MRI; long term memory loss"   Type II diabetes mellitus (HCC)     Current Outpatient Medications  Medication Sig Dispense Refill   allopurinol  (ZYLOPRIM ) 100 MG tablet Take 1 tablet by mouth once daily 30 tablet 0   apixaban  (ELIQUIS ) 5 MG TABS tablet Take 1 tablet by mouth twice daily 60 tablet 5   atorvastatin  (LIPITOR ) 80 MG tablet Take 1 tablet (80 mg total) by mouth daily. 30 tablet 0   Cephalexin 250 MG tablet Take 250 mg by mouth daily. Daily for UTI prevention.     Cholecalciferol (VITAMIN D-3 PO) Take 1 capsule by mouth daily.     dapagliflozin  propanediol (FARXIGA ) 10 MG TABS tablet Take 1 tablet (10 mg total) by mouth daily. 90 tablet 3   gabapentin  (NEURONTIN ) 300 MG capsule Take 1 capsule by mouth at bedtime.     glucose blood (ONETOUCH VERIO) test strip USE AS DIRECTED TO  CHECK  BLOOD  SUGAR  FOUR  TIMES  DAILY 400 each 3   insulin  glargine  (LANTUS ) 100 UNIT/ML injection Inject 0.2 mLs (20 Units total) into the skin daily. 20 mL 3   insulin  regular (NOVOLIN R) 100 units/mL injection Inject 0.08-0.1 mLs (8-10 Units total) into the skin 2 (  two) times daily before a meal. 30 mL 1   ondansetron  (ZOFRAN -ODT) 4 MG disintegrating tablet Take 1 tablet (4 mg total) by mouth every 8 (eight) hours as needed for nausea or vomiting. 20 tablet 0   pantoprazole  (PROTONIX ) 40 MG tablet Take 1 tablet (40 mg total) by mouth daily. 30 tablet 0   sacubitril -valsartan  (ENTRESTO ) 49-51 MG Take 1 tablet by mouth 2 (two) times daily. 180 tablet 3   sertraline  (ZOLOFT ) 50 MG tablet Take 50 mg by mouth daily.     spironolactone  (ALDACTONE ) 25 MG tablet Take 1/2 tablet (12.5 mg total) by mouth daily. 30 tablet 1   torsemide  (DEMADEX ) 20 MG tablet Take 1 tablet Monday, Wednesday, Friday and 1/2 tablet on Sunday. 80 tablet 3   vitamin B-12 (CYANOCOBALAMIN ) 1000 MCG tablet Take 1,000 mcg by mouth daily.     No current facility-administered medications for this encounter.   Vitals:   05/27/23 1153  BP: (!) 154/62  Pulse: 67  SpO2: 100%  Weight: 85.4 kg (188 lb 3.2 oz)  Height: 5\' 3"  (1.6 m)   Wt Readings from Last 3 Encounters:  05/27/23 85.4 kg (188 lb 3.2 oz)  03/09/23 82.5 kg (181 lb 14.4 oz)  02/22/23 86.8 kg (191 lb 5.8 oz)   PHYSICAL EXAM: General: Well appearing. No distress on RA Cardiac: JVP ~8cm. S1 and S2 present. No murmurs or rub. Resp: Lung sounds clear and equal B/L Abdomen: Soft, non-tender, non-distended.  Extremities: Warm and dry.  1+ BLE edema.  Neuro: Alert and oriented x3. Affect pleasant. Moves all extremities without difficulty.  ASSESSMENT & PLAN:  1. Chronic Systolic Heart Failure with recovered EF - Mainly ICM but ? Component of NICM  - Echo 7/22 at the time of large CVA revealed LVEF 35 to 40%. She was also COVID + ? Component of stressed induced CM  - LHC 7/22 w/ multivessel CAD including total occlusion of the  proximal to mid LAD with left to left and right to left collaterals. First diagonal is large and also fills by collaterals, 60% m-dRCA disease, treated medically  - cMRI 10/22 most c/w ICM. While she does bilateral carpal tunnel syndrome and spinal stenosis (possible extracardiac manifestations of amyloid), her cMRI was not suggestive of infiltrative process  - Echo 7/23 w/ further drop in LVEF, down to 20-25%, RV mildly reduced. No recent CP. Hs trop during admission mildly elevated but level and trend not c/w ACS - Echo 11/23 with recovered EF ~ 70%-75%. RV normal.  - NYHA I. Having more BLE edema, suspect PVD. Order compression socks, she is agreeable to wearing. - Taking Torsemide  4-5x/wk. Increase Torsemide  to 20 mg daily. BMET/BNP today.  - Off bb due to fatigue and bradycardia - Continue Entresto  to 49-51 mg bid  - Continue Spironolactone  12.5 mg daily  - Continue Farxiga  10 mg daily. No GU s/s - Will schedule to update echo   2. CAD - Cath 7/22>>100% occlusion of the mid LAD and D2.  She had left to left and right to left collaterals.  She also had 50% stenosis in the mid LAD and 70% distal OM 3 stenoses.  She had 60% stenosis in the RCA.CMRI with extensive scar.  - treating medically. Cath not felt to add anything.  - No chest pain.  - continue statin (no ASA w/ Eliqus)  - Off BB due to fatigue and bradycardia    3. PAF - regular rhythm on exam - continue Eliquis . Denies abnormal bleeding.  -  Will need to drop Eliquis  dose 07/2023 if sCr does not improve   4. Stage IIIb CKD  - b/l sCr ~1.5 - Last sCr 1.72. BMET today  - On farxiga  10 mg daily    5. H/o CVA - 2022, cardioembolic in setting of PAF and reduced LVEF w/ ? LV thrombus  - continue Eliquis  indefinitely for secondary prevention  - has residual vertigo, refuses PT  6. Palpitations  - 7 day Zio 7/24 showed 1 runs NSVT and rare PVCs  Follow up in 1 month with APP  [place] Lee, NP 12:36 PM'  Patient seen and  examined with the above-signed Advanced Practice Provider and/or Housestaff. I personally reviewed laboratory data, imaging studies and relevant notes. I independently examined the patient and formulated the important aspects of the plan. I have edited the note to reflect any of my changes or salient points. I have personally discussed the plan with the patient and/or family.  78 y/o with CAD s/p previous LAD infarct, systolic HF with recovered EF, PAF and CKD 3b.  Retruns for f/u with niece. Living at ALF> Overall doing well. No CP or undue SOB. NYHA I-II. Main issue is LE edema. On 3-drug GDMT (intolerant of b-blocker)  General: Elderly. No resp difficulty HEENT: normal Neck: supple. JVP 7-8 Carotids 2+ bilat; no bruits. No lymphadenopathy or thryomegaly appreciated. Cor: PMI nondisplaced. Regular rate & rhythm. No rubs, gallops or murmurs. Lungs: clear Abdomen: soft, nontender, nondistended. No hepatosplenomegaly. No bruits or masses. Good bowel sounds. Extremities: no cyanosis, clubbing, rash, 1+ edema Neuro: alert & orientedx3, cranial nerves grossly intact. moves all 4 extremities w/o difficulty. Affect pleasant   Doing well. Given previous MRI results I was skeptical of most recent echo finding of EF 70-75%. I reviewed study personally and agreed EF was normal with no RWMA. Has some volume overload on exam. Will increase torsemide  and place compression hose. F/u in Clinic in 1 month. Given age and CKD would drop Eliquis  to 2.5 t that time.   Jules Oar, MD  3:15 PM

## 2023-05-27 NOTE — Patient Instructions (Addendum)
 Medication Changes:  INCREASE TORSEMIDE  TO 20 mg EVERY DAY  Lab Work:  Labs done today, your results will be available in MyChart, we will contact you for abnormal readings.  Your physician recommends that you return for lab work in: 1 week  Special Instructions // Education:  COMPRESSION STOCKINGS ORDER GIVEN, Please wear your compression hose daily, place them on as soon as you get up in the morning and remove before you go to bed at night.   Follow-Up in: 1 month  At the [facility], you and your health needs are our priority. We have a designated team specialized in the treatment of Heart Failure. This Care Team includes your primary Heart Failure Specialized Cardiologist (physician), Advanced Practice Providers (APPs- Physician Assistants and Nurse Practitioners), and Pharmacist who all work together to provide you with the care you need, when you need it.   You may see any of the following providers on your designated Care Team at your next follow up:  Dr. Jules Oar Dr. Peder Bourdon Dr. Alwin Baars Dr. Judyth Nunnery Nieves Bars, NP Ruddy Corral, [place] [facility] [place], [place] Dennise Fitz, NP [place] Lee, NP Luster Salters, PharmD   Please be sure to bring in all your medications bottles to every appointment.   Need to Contact Us :  If you have any questions or concerns before your next appointment please send us  a message through [place] or call our office at [phone].    TO LEAVE A MESSAGE FOR THE NURSE SELECT OPTION 2, PLEASE LEAVE A MESSAGE INCLUDING: YOUR NAME DATE OF BIRTH CALL BACK NUMBER REASON FOR CALL**this is important as we prioritize the call backs  YOU WILL RECEIVE A CALL BACK THE SAME DAY AS LONG AS YOU CALL BEFORE 4:00 PM

## 2023-06-03 ENCOUNTER — Ambulatory Visit (HOSPITAL_COMMUNITY)
Admission: RE | Admit: 2023-06-03 | Discharge: 2023-06-03 | Disposition: A | Discharge status: Home / Self Care | Source: Ambulatory Visit | Attending: Internal Medicine | Admitting: Internal Medicine

## 2023-06-03 DIAGNOSIS — I5042 Chronic combined systolic (congestive) and diastolic (congestive) heart failure: Secondary | ICD-10-CM | POA: Diagnosis not present

## 2023-06-03 LAB — BASIC METABOLIC PANEL WITH GFR
Anion gap: 10 (ref 5–15)
BUN: 33 mg/dL — ABNORMAL HIGH (ref 8–23)
CO2: 27 mmol/L (ref 22–32)
Calcium: 9.8 mg/dL (ref 8.9–10.3)
Chloride: 105 mmol/L (ref 98–111)
Creatinine, Ser: 1.78 mg/dL — ABNORMAL HIGH (ref 0.44–1.00)
GFR, Estimated: 29 mL/min — ABNORMAL LOW (ref >60)
Glucose, Bld: 235 mg/dL — ABNORMAL HIGH (ref 70–99)
Potassium: 4.5 mmol/L (ref 3.5–5.1)
Sodium: 142 mmol/L (ref 135–145)

## 2023-06-22 ENCOUNTER — Telehealth (HOSPITAL_COMMUNITY): Payer: Self-pay

## 2023-06-22 NOTE — Telephone Encounter (Addendum)
 Ira Mann called wanting to know if the patient should continue current Torsemide  regimen. She was suppose to be seen this Friday but due to a personal scheduling conflict. Please advise.   CB#[phone]

## 2023-06-23 NOTE — Telephone Encounter (Signed)
 No answer, Left message to return call.

## 2023-06-23 NOTE — Telephone Encounter (Signed)
 Yolanda Saunders advised and verbalized understanding.

## 2023-06-26 ENCOUNTER — Encounter (HOSPITAL_COMMUNITY)

## 2023-07-17 NOTE — Progress Notes (Signed)
 ADVANCED HF CLINIC NOTE  PCP: [name], MD Primary Cardiologist: Dr. Raford HF MD: Dr. Cherrie  HPI: [name] is a 78 y.o. female w/ chronic combined systolic due to iCM, CAD, Stage III CKD, Type 2DM, HTN, HLD, h/o stroke and bradycardia.  Admitted 7/22 with large right PCA infarct wiith 75% stenosis of right PCA + severe distal left P2 stenosis.  Brain MRI rain confirmed right PCA infarct and underlying chronic microvascular disease. Echo EF 35-40%.  She also had COVID-19. LHC 100% occlusion of mid LAD and D2.  She had L->L & R-> L collaterals.  RCA 60%.  She was discharged with a 30-day monitor which showed PAF. cMRI .10/22  LVEF 38% with LGE consistent with prior infarction. There was no LV thrombus or evidence of infiltrative process   Admitted 7/23 w/ a/c systolic CHF and hypoxic respiratory failure. She required ntubation. COVID negative. Denied CP. Hs trop 68>>65. Echo revealed further drop LVEF 20-25% and global HK. G2DD and mild RV HK. Diuresed. GDMT titrated   [facility]. BB stopped due to fatigue/bradycardia, entresto  increased, and torsemide  was switched to as needed. She took tosemide 5 mg 8/4 and 8/5 and 10 mg on 8/7, 8/8, 8/9.    Echo 11/23 EF 70-75%  Follow up 5/25, NYHA I-II, volume up with LEE. Torsemide  increased and compression hose placed.   Today she returns for HF follow up with her niece. Overall feeling fine. Lives in ALF, niece manages her pillbox. She has SOB walking further distances on flat ground. She has chronic atypical CP, occurs randomly, resolves spontaneously. She has palpitations occasionally, this is chronic. Has swelling in feet, legs and hands. She has chronic vertigo. Denies abnormal bleeding, or PND/Orthopnea. Appetite ok. She does not weigh at home. Taking all medications. Not sleeping well, having hallucinations at night. Not not wearing CPAP.   Cardiac Studies    - Echo 11/23: EF 70-75% RV  normal.   - Echo 7/23: EF 20-25%, G2DD, RV mildly reduced, mild MR, moderate TR   - LHC 07/2020 RA 4 PA 33/15 (22) , PCWP 13, CO 7.2 CI 3.4    Mid LAD lesion is 100% stenosed.   2nd Diag lesion is 100% stenosed.   Total occlusion of the proximal to mid LAD with left to left and right to left collaterals.  LAD does not wraparound apex.  The first diagonal is large and also fills by collaterals. Irregularities with up to 50% in mid circumflex.  The very distal third obtuse marginal contains segmental 70% stenosis. Left main is widely patent RCA has diffuse 60% mid to distal narrowing after the first acute marginal ostium. Right heart pressures are normal LVEDP is normal   Diagnostic Dominance: Right  Intervention         - Cardiac MRI 10/2020: LVEF 38%, RVEF 56%, LGE left ventricular myocardium: There is apical inferior 75 % LGE suggestive of infarction. There is apical anterior septum and anterior remodeling and thinning.   ROS: All systems negative except as listed in HPI, PMH and Problem List.  SH:  Social History   Socioeconomic History   Marital status: Widowed    Spouse name: Not on file   Number of children: 2   Years of education: Not on file   Highest education level: Not on file  Occupational History   Occupation: retired  Tobacco Use   Smoking status: Former    Current packs/day: 0.00  Average packs/day: 0.8 packs/day for 6.0 years (4.5 ttl pk-yrs)    Types: Cigarettes    Start date: 01/20/1974    Quit date: 01/21/1980    Years since quitting: 43.5   Smokeless tobacco: Never  Vaping Use   Vaping status: Not on file  Substance and Sexual Activity   Alcohol use: No    Alcohol/week: 0.0 standard drinks of alcohol   Drug use: No   Sexual activity: Not Currently  Other Topics Concern   Not on file  Social History Narrative   Lives with husband in a one story home.  Has 2 children.  Retired from [employer].  Education: 12th grade.  Trade schools.     Social Drivers of Corporate investment banker Strain: Low Risk  (07/29/2021)   Overall Financial Resource Strain (CARDIA)    Difficulty of Paying Living Expenses: Not hard at all  Food Insecurity: No Food Insecurity (07/29/2021)   Hunger Vital Sign    Worried About Running Out of Food in the Last Year: Never true    Ran Out of Food in the Last Year: Never true  Transportation Needs: No Transportation Needs (07/29/2021)   PRAPARE - Administrator, Civil Service (Medical): No    Lack of Transportation (Non-Medical): No  Physical Activity: Not on file  Stress: Not on file  Social Connections: Not on file  Intimate Partner Violence: Not on file    FH:  Family History  Problem Relation Age of Onset   Coronary artery disease Father    Diabetes Mellitus I Father    CVA Mother    Stroke Mother    Diabetes Mellitus I Mother    Diabetes Mellitus I Sister    Diabetes Mellitus I Brother    Diabetes Mellitus I Maternal Grandmother    Diabetes Mellitus I Maternal Grandfather    Diabetes Mellitus I Paternal Grandmother    Diabetes Mellitus I Paternal Grandfather    Diabetes Mellitus I Brother    Aortic aneurysm Son     Past Medical History:  Diagnosis Date   Anemia 04/17/11   long, long years ago   Angina 04/16/11   that's what I'm here for   Anxiety    Arthritis    Hips and knees   Asthma    CAD in native artery 08/07/2021   Carpal tunnel syndrome    Cataracts, bilateral    Chronic combined systolic and diastolic heart failure (HCC) 08/07/2020   CKD (chronic kidney disease), stage III (HCC)    Complication of anesthesia 1987   affected my eyes; couldn't see anything but blurrs even the next day   CVA (cerebral infarction)    After cardiac catheter 02/2000   Depression    Edema    Fibromyalgia    GERD (gastroesophageal reflux disease)    Headache(784.0)    High cholesterol    History of bronchitis    Hypertension    IBS (irritable bowel syndrome)     Migraines    PAF (paroxysmal atrial fibrillation) (HCC) 08/07/2021   Panic attacks 04/17/11   don't take anything for it   Pneumonia 04/17/11   probably as many as 7 times   Renal disorder 04/17/11   they are working at 60% capacity   Shortness of breath on exertion    cause of my asthma   Stroke (HCC) 2002   residual problem w/using the right word, left 5 lesions on my brain/MRI; long term memory loss  Type II diabetes mellitus (HCC)     Current Outpatient Medications  Medication Sig Dispense Refill   allopurinol  (ZYLOPRIM ) 100 MG tablet Take 1 tablet by mouth once daily 30 tablet 0   apixaban  (ELIQUIS ) 5 MG TABS tablet Take 1 tablet by mouth twice daily 60 tablet 5   atorvastatin  (LIPITOR ) 80 MG tablet Take 1 tablet (80 mg total) by mouth daily. 30 tablet 0   Cephalexin 250 MG tablet Take 250 mg by mouth daily. Daily for UTI prevention.     Cholecalciferol (VITAMIN D-3 PO) Take 1 capsule by mouth daily.     dapagliflozin  propanediol (FARXIGA ) 10 MG TABS tablet Take 1 tablet (10 mg total) by mouth daily. 90 tablet 3   gabapentin  (NEURONTIN ) 300 MG capsule Take 1 capsule by mouth at bedtime.     glucose blood (ONETOUCH VERIO) test strip USE AS DIRECTED TO  CHECK  BLOOD  SUGAR  FOUR  TIMES  DAILY 400 each 3   insulin  glargine (LANTUS ) 100 UNIT/ML injection Inject 0.2 mLs (20 Units total) into the skin daily. 20 mL 3   insulin  regular (NOVOLIN R) 100 units/mL injection Inject 0.08-0.1 mLs (8-10 Units total) into the skin 2 (two) times daily before a meal. 30 mL 1   ondansetron  (ZOFRAN -ODT) 4 MG disintegrating tablet Take 1 tablet (4 mg total) by mouth every 8 (eight) hours as needed for nausea or vomiting. 20 tablet 0   pantoprazole  (PROTONIX ) 40 MG tablet Take 1 tablet (40 mg total) by mouth daily. 30 tablet 0   sacubitril -valsartan  (ENTRESTO ) 49-51 MG Take 1 tablet by mouth 2 (two) times daily. 180 tablet 3   sertraline  (ZOLOFT ) 50 MG tablet Take 50 mg by mouth daily.      spironolactone  (ALDACTONE ) 25 MG tablet Take 1/2 tablet (12.5 mg total) by mouth daily. 30 tablet 1   torsemide  (DEMADEX ) 20 MG tablet Take 1 tablet (20 mg total) by mouth daily. 30 tablet 5   vitamin B-12 (CYANOCOBALAMIN ) 1000 MCG tablet Take 1,000 mcg by mouth daily.     No current facility-administered medications for this encounter.   Wt Readings from Last 3 Encounters:  07/20/23 85.5 kg (188 lb 9.6 oz)  05/27/23 85.4 kg (188 lb 3.2 oz)  03/09/23 82.5 kg (181 lb 14.4 oz)   BP 122/67   Pulse 79   Ht 5' 3 (1.6 m)   Wt 85.5 kg (188 lb 9.6 oz)   SpO2 94%   BMI 33.41 kg/m   PHYSICAL EXAM: General:  NAD. No resp difficulty, walked into clinic, elderly HEENT: Normal Neck: Supple. No JVD. Cor: Regular rate & rhythm. No rubs, gallops or murmurs. Lungs: Clear Abdomen: Soft, nontender, nondistended.  Extremities: No cyanosis, clubbing, rash, edema Neuro: Alert & oriented x 3, moves all 4 extremities w/o difficulty. Affect pleasant.  ECG (personally reviewed):  ReDs reading: 33 %, normal  ASSESSMENT & PLAN:  1. Chronic Systolic Heart Failure with recovered EF - Mainly ICM but ? Component of NICM  - Echo 7/22: at the time of large CVA revealed LVEF 35 to 40%. She was also COVID + ? Component of stressed induced CM  - LHC 7/22: w/ multivessel CAD including total occlusion of the proximal to mid LAD with left to left and right to left collaterals. First diagonal is large and also fills by collaterals, 60% m-dRCA disease, treated medically  - cMRI 10/22: most c/w ICM. While she does bilateral carpal tunnel syndrome and spinal stenosis (possible extracardiac manifestations of  amyloid), her cMRI was not suggestive of infiltrative process  - Echo 7/23 w/ further drop in LVEF, down to 20-25%, RV mildly reduced. No recent CP. Hs trop during admission mildly elevated but level and trend not c/w ACS - Echo 11/23: with recovered EF ~ 70%-75%. RV normal.  - NYHA I-II, volume OK, ReDs 33%. -  Given Rx for compression hose. - Continue torsemide  20 mg daily.  - Continue Entresto  49-51 mg bid  - Continue spironolactone  12.5 mg daily  - Continue Farxiga  10 mg daily. No GU s/s - Off bb due to fatigue and bradycardia - Repeat echo ordered. - Labs today.   2. CAD - Cath 7/22>>100% occlusion of the mid LAD and D2.  She had left to left and right to left collaterals.  She also had 50% stenosis in the mid LAD and 70% distal OM 3 stenoses.  She had 60% stenosis in the RCA.CMRI with extensive scar.  - treating medically. Cath not felt to add anything.  - No chest pain.  - Continue statin (no ASA w/ Eliqus)    3. PAF - NSR on ECG today. - Continue Eliquis , drop dose to 2.5 mg bid (age and SCr) - CBC and iron panel today.   4. CKD IIIb - Baseline SCr 1.5-1.7 - Continue Farixga. - BMET today.    5. H/o CVA - 2022, cardioembolic in setting of PAF and reduced LVEF w/ ? LV thrombus  - Continue Eliquis  indefinitely for secondary prevention  - Has residual vertigo, refuses PT  6. Palpitations  - 7 day Zio 7/24 showed 1 runs NSVT and rare PVCs - No ectopy on ECG today.  Follow up in 6 months with Dr. Bensimhon. If remains stable, consider graduation from AHF clinic.  Yolanda CHRISTELLA Gainer, FNP  11:36 AM

## 2023-07-20 ENCOUNTER — Ambulatory Visit (HOSPITAL_COMMUNITY)
Admission: RE | Admit: 2023-07-20 | Discharge: 2023-07-20 | Disposition: A | Discharge status: Home / Self Care | Source: Ambulatory Visit | Attending: Family Medicine | Admitting: Family Medicine

## 2023-07-20 ENCOUNTER — Ambulatory Visit (HOSPITAL_COMMUNITY): Payer: Self-pay | Admitting: Family Medicine

## 2023-07-20 ENCOUNTER — Encounter (HOSPITAL_COMMUNITY): Payer: Self-pay

## 2023-07-20 VITALS — BP 122/67 | HR 79 | Ht 63.0 in | Wt 188.6 lb

## 2023-07-20 DIAGNOSIS — R002 Palpitations: Secondary | ICD-10-CM | POA: Diagnosis not present

## 2023-07-20 DIAGNOSIS — E1122 Type 2 diabetes mellitus with diabetic chronic kidney disease: Secondary | ICD-10-CM | POA: Diagnosis not present

## 2023-07-20 DIAGNOSIS — R42 Dizziness and giddiness: Secondary | ICD-10-CM | POA: Insufficient documentation

## 2023-07-20 DIAGNOSIS — R5383 Other fatigue: Secondary | ICD-10-CM | POA: Insufficient documentation

## 2023-07-20 DIAGNOSIS — Z794 Long term (current) use of insulin: Secondary | ICD-10-CM | POA: Diagnosis not present

## 2023-07-20 DIAGNOSIS — R9431 Abnormal electrocardiogram [ECG] [EKG]: Secondary | ICD-10-CM | POA: Insufficient documentation

## 2023-07-20 DIAGNOSIS — Z87891 Personal history of nicotine dependence: Secondary | ICD-10-CM | POA: Insufficient documentation

## 2023-07-20 DIAGNOSIS — I2510 Atherosclerotic heart disease of native coronary artery without angina pectoris: Secondary | ICD-10-CM | POA: Insufficient documentation

## 2023-07-20 DIAGNOSIS — Z79899 Other long term (current) drug therapy: Secondary | ICD-10-CM | POA: Insufficient documentation

## 2023-07-20 DIAGNOSIS — I2582 Chronic total occlusion of coronary artery: Secondary | ICD-10-CM | POA: Diagnosis not present

## 2023-07-20 DIAGNOSIS — I255 Ischemic cardiomyopathy: Secondary | ICD-10-CM | POA: Insufficient documentation

## 2023-07-20 DIAGNOSIS — N1831 Chronic kidney disease, stage 3a: Secondary | ICD-10-CM | POA: Diagnosis not present

## 2023-07-20 DIAGNOSIS — N1832 Chronic kidney disease, stage 3b: Secondary | ICD-10-CM | POA: Diagnosis not present

## 2023-07-20 DIAGNOSIS — R0789 Other chest pain: Secondary | ICD-10-CM | POA: Insufficient documentation

## 2023-07-20 DIAGNOSIS — I130 Hypertensive heart and chronic kidney disease with heart failure and stage 1 through stage 4 chronic kidney disease, or unspecified chronic kidney disease: Secondary | ICD-10-CM | POA: Insufficient documentation

## 2023-07-20 DIAGNOSIS — Z7984 Long term (current) use of oral hypoglycemic drugs: Secondary | ICD-10-CM | POA: Diagnosis not present

## 2023-07-20 DIAGNOSIS — I5042 Chronic combined systolic (congestive) and diastolic (congestive) heart failure: Secondary | ICD-10-CM | POA: Diagnosis not present

## 2023-07-20 DIAGNOSIS — R001 Bradycardia, unspecified: Secondary | ICD-10-CM | POA: Insufficient documentation

## 2023-07-20 DIAGNOSIS — I480 Paroxysmal atrial fibrillation: Secondary | ICD-10-CM | POA: Diagnosis not present

## 2023-07-20 DIAGNOSIS — Z7901 Long term (current) use of anticoagulants: Secondary | ICD-10-CM | POA: Insufficient documentation

## 2023-07-20 DIAGNOSIS — E7800 Pure hypercholesterolemia, unspecified: Secondary | ICD-10-CM | POA: Insufficient documentation

## 2023-07-20 DIAGNOSIS — Z8673 Personal history of transient ischemic attack (TIA), and cerebral infarction without residual deficits: Secondary | ICD-10-CM | POA: Insufficient documentation

## 2023-07-20 DIAGNOSIS — I25118 Atherosclerotic heart disease of native coronary artery with other forms of angina pectoris: Secondary | ICD-10-CM | POA: Diagnosis not present

## 2023-07-20 LAB — BASIC METABOLIC PANEL WITH GFR
Anion gap: 10 (ref 5–15)
BUN: 38 mg/dL — ABNORMAL HIGH (ref 8–23)
CO2: 29 mmol/L (ref 22–32)
Calcium: 9.6 mg/dL (ref 8.9–10.3)
Chloride: 102 mmol/L (ref 98–111)
Creatinine, Ser: 1.94 mg/dL — ABNORMAL HIGH (ref 0.44–1.00)
GFR, Estimated: 26 mL/min — ABNORMAL LOW (ref >60)
Glucose, Bld: 115 mg/dL — ABNORMAL HIGH (ref 70–99)
Potassium: 4.6 mmol/L (ref 3.5–5.1)
Sodium: 141 mmol/L (ref 135–145)

## 2023-07-20 LAB — CBC
HCT: 42.6 % (ref 36.0–46.0)
Hemoglobin: 13.4 g/dL (ref 12.0–15.0)
MCH: 30.1 pg (ref 26.0–34.0)
MCHC: 31.5 g/dL (ref 30.0–36.0)
MCV: 95.7 fL (ref 80.0–100.0)
Platelets: 177 10*3/uL (ref 150–400)
RBC: 4.45 MIL/uL (ref 3.87–5.11)
RDW: 13.5 % (ref 11.5–15.5)
WBC: 6.7 10*3/uL (ref 4.0–10.5)
nRBC: 0 % (ref 0.0–0.2)

## 2023-07-20 LAB — IRON AND TIBC
Iron: 74 ug/dL (ref 28–170)
Saturation Ratios: 18 % (ref 10.4–31.8)
TIBC: 405 ug/dL (ref 250–450)
UIBC: 331 ug/dL

## 2023-07-20 LAB — FERRITIN: Ferritin: 26 ng/mL (ref 11–307)

## 2023-07-20 LAB — BRAIN NATRIURETIC PEPTIDE: B Natriuretic Peptide: 70.6 pg/mL (ref 0.0–100.0)

## 2023-07-20 MED ORDER — APIXABAN 2.5 MG PO TABS
2.5000 mg | ORAL_TABLET | Freq: Two times a day (BID) | ORAL | 3 refills | Status: AC
Start: 2023-07-20 — End: ?

## 2023-07-20 NOTE — Patient Instructions (Signed)
 Medication Changes:  Decrease Eliquis  to 2.5 mg twice daily   Lab Work:  BMP, BNP, CBC, TIBC, Iron, Ferretin today - will call you if abnormal.   Follow-Up in:  return to see Dr. Bensimhon in 6 months. CALL US  AT [phone] IN DECEMBER TO SCHEDULE THIS APPOINTMENT.   At the [facility], you and your health needs are our priority. We have a designated team specialized in the treatment of Heart Failure. This Care Team includes your primary Heart Failure Specialized Cardiologist (physician), Advanced Practice Providers (APPs- Physician Assistants and Nurse Practitioners), and Pharmacist who all work together to provide you with the care you need, when you need it.   You may see any of the following providers on your designated Care Team at your next follow up:  Dr. Toribio Fuel Dr. Ezra Shuck Dr. Ria Commander Dr. Odis Brownie Greig Mosses, NP Caffie Shed, Yolanda Saunders [facility] [place], Yolanda Saunders Beckey Coe, NP [place], NP Tinnie Redman, PharmD   Please be sure to bring in all your medications bottles to every appointment.   Please wear compression hose daily - Rx given.   Need to Contact Us :  If you have any questions or concerns before your next appointment please send us  a message through [place] or call our office at [phone].    TO LEAVE A MESSAGE FOR THE NURSE SELECT OPTION 2, PLEASE LEAVE A MESSAGE INCLUDING: YOUR NAME DATE OF BIRTH CALL BACK NUMBER REASON FOR CALL**this is important as we prioritize the call backs  YOU WILL RECEIVE A CALL BACK THE SAME DAY AS LONG AS YOU CALL BEFORE 4:00 PM

## 2023-07-21 DIAGNOSIS — E1121 Type 2 diabetes mellitus with diabetic nephropathy: Secondary | ICD-10-CM | POA: Diagnosis not present

## 2023-07-21 DIAGNOSIS — M25511 Pain in right shoulder: Secondary | ICD-10-CM | POA: Diagnosis not present

## 2023-07-21 DIAGNOSIS — M25619 Stiffness of unspecified shoulder, not elsewhere classified: Secondary | ICD-10-CM | POA: Diagnosis not present

## 2023-07-28 ENCOUNTER — Ambulatory Visit: Admitting: Internal Medicine

## 2023-07-31 ENCOUNTER — Other Ambulatory Visit (HOSPITAL_COMMUNITY): Payer: Self-pay | Admitting: Cardiovascular Disease

## 2023-07-31 ENCOUNTER — Other Ambulatory Visit: Payer: Self-pay | Admitting: Internal Medicine

## 2023-07-31 ENCOUNTER — Other Ambulatory Visit: Payer: Self-pay | Admitting: Podiatry

## 2023-08-05 ENCOUNTER — Telehealth: Payer: Self-pay | Admitting: Podiatry

## 2023-08-05 NOTE — Telephone Encounter (Signed)
 Received refill request for Allopurinol  for gout. Can you see if she still needs this? It does not appear this has been filled in a year.

## 2023-08-06 NOTE — Telephone Encounter (Signed)
 Attempted to call and clarify no answer left message to return call Yolanda Saunders

## 2023-08-06 NOTE — Telephone Encounter (Signed)
 Spoke with power of attorney she stated patient hasn't been on that for a long time and doesn't need it.

## 2023-08-11 ENCOUNTER — Encounter: Payer: Self-pay | Admitting: Orthopaedic Surgery

## 2023-08-11 ENCOUNTER — Ambulatory Visit (INDEPENDENT_AMBULATORY_CARE_PROVIDER_SITE_OTHER): Admitting: Orthopaedic Surgery

## 2023-08-11 ENCOUNTER — Other Ambulatory Visit (INDEPENDENT_AMBULATORY_CARE_PROVIDER_SITE_OTHER)

## 2023-08-11 DIAGNOSIS — G8929 Other chronic pain: Secondary | ICD-10-CM

## 2023-08-11 DIAGNOSIS — M25511 Pain in right shoulder: Secondary | ICD-10-CM | POA: Diagnosis not present

## 2023-08-11 NOTE — Progress Notes (Signed)
 Office Visit Note   Patient: Yolanda Saunders           Date of Birth: February 10, 1945           MRN: [id number] Visit Date: 08/11/2023              Requested by: Laurice President, NP [address] [place],  [place] [address] PCP: [name], MD   Assessment & Plan: Visit Diagnoses:  1. Chronic right shoulder pain     Plan: History of Present Illness Yolanda Saunders is an 78 year old female with poorly controlled diabetes who presents with right shoulder pain. She was referred by her primary care doctor for evaluation of right shoulder pain.  She has experienced right shoulder pain for two months, described as deep within the joint and constant, worsening with lifting. Initially, she was unable to lift her arm, but the pain has slightly reduced over time. X-rays show mild subcortical cystic changes in the lateral humerus head, suggestive of rotator cuff pathology, though a small rotator cuff tear was previously mentioned but not visible on x-ray. She takes tramadol for pain, which is ineffective, and occasionally uses Tylenol  without significant relief. Her diabetes is poorly controlled, with a recent A1c indicating suboptimal management.  Physical Exam MUSCULOSKELETAL: Right shoulder rotator cuff with good function and strength. Pain on flexibility testing, forward flexion above ninety degrees, external rotation at forty five degrees, and abduction at seventy five degrees.  Results LABS Hemoglobin A1c: 8.4  RADIOLOGY Right shoulder X-ray: No acute abnormalities  Assessment and Plan Right shoulder pain with inflammation Chronic right shoulder pain with inflammation, likely due to combination of adhesive capsulitis and rotator cuff tendinosis.  X-rays show mild subcortical cystic changes in the lateral humerus head. - Administer intra-articular cortisone injection in the right shoulder.  Will refer to Dr. Burnetta  Diabetes mellitus, poorly controlled Poorly  controlled diabetes mellitus, increasing risk for adhesive capsulitis. - Advise to improve diabetes control.  Follow-Up Instructions: No follow-ups on file.   Orders:  Orders Placed This Encounter  Procedures   XR Shoulder Right   AMB referral to sports medicine   No orders of the defined types were placed in this encounter.     Procedures: No procedures performed   Clinical Data: No additional findings.   Subjective: Chief Complaint  Patient presents with   Right Shoulder - Pain    HPI  Review of Systems   Objective: Vital Signs: There were no vitals taken for this visit.  Physical Exam  Ortho Exam  Specialty Comments:  No specialty comments available.  Imaging: XR Shoulder Right Result Date: 08/11/2023 3 view xrays show no acute or structural abnormalities    PMFS History: Patient Active Problem List   Diagnosis Date Noted   OSA (obstructive sleep apnea) 10/24/2021   Acute hypercapnic respiratory failure (HCC) 10/03/2021   PAF (paroxysmal atrial fibrillation) (HCC) 08/07/2021   CAD in native artery 08/07/2021   Acute hypoxemic respiratory failure (HCC) 07/30/2021   Acute pulmonary edema (HCC) 07/30/2021   Chronic combined systolic and diastolic heart failure (HCC) 08/07/2020   Stroke (cerebrum) (HCC) 08/03/2020   Stroke (HCC) 08/02/2020   Lumbar radiculopathy 06/01/2020   Plantar fasciitis 06/03/2017   Syncope 10/15/2016   CKD (chronic kidney disease), stage III (HCC) 10/15/2016   Type 2 diabetes mellitus with stage 3 chronic kidney disease, with long-term current use of insulin  (HCC) 08/07/2015   Bilateral leg weakness 06/06/2014  Abnormality of gait 06/06/2014   Lumbosacral stenosis 06/06/2014   Chest pain 04/16/2011   Hyperlipidemia 09/13/2008   Class 1 obesity 09/13/2008   Essential hypertension 09/13/2008   Cerebral artery occlusion with cerebral infarction (HCC) 09/13/2008   Asthma 09/13/2008   IRRITABLE BOWEL SYNDROME 09/13/2008    Calculus of gallbladder 09/13/2008   UTI 09/13/2008   Arthropathy 09/13/2008   FIBROMYALGIA 09/13/2008   Headache 09/13/2008   Past Medical History:  Diagnosis Date   Anemia 04/17/11   long, long years ago   Angina 04/16/11   that's what I'm here for   Anxiety    Arthritis    Hips and knees   Asthma    CAD in native artery 08/07/2021   Carpal tunnel syndrome    Cataracts, bilateral    Chronic combined systolic and diastolic heart failure (HCC) 08/07/2020   CKD (chronic kidney disease), stage III (HCC)    Complication of anesthesia 1987   affected my eyes; couldn't see anything but blurrs even the next day   CVA (cerebral infarction)    After cardiac catheter 02/2000   Depression    Edema    Fibromyalgia    GERD (gastroesophageal reflux disease)    Headache(784.0)    High cholesterol    History of bronchitis    Hypertension    IBS (irritable bowel syndrome)    Migraines    PAF (paroxysmal atrial fibrillation) (HCC) 08/07/2021   Panic attacks 04/17/11   don't take anything for it   Pneumonia 04/17/11   probably as many as 7 times   Renal disorder 04/17/11   they are working at 60% capacity   Shortness of breath on exertion    cause of my asthma   Stroke (HCC) 2002   residual problem w/using the right word, left 5 lesions on my brain/MRI; long term memory loss   Type II diabetes mellitus (HCC)     Family History  Problem Relation Age of Onset   Coronary artery disease Father    Diabetes Mellitus I Father    CVA Mother    Stroke Mother    Diabetes Mellitus I Mother    Diabetes Mellitus I Sister    Diabetes Mellitus I Brother    Diabetes Mellitus I Maternal Grandmother    Diabetes Mellitus I Maternal Grandfather    Diabetes Mellitus I Paternal Grandmother    Diabetes Mellitus I Paternal Grandfather    Diabetes Mellitus I Brother    Aortic aneurysm Son     Past Surgical History:  Procedure Laterality Date   CARDIAC CATHETERIZATION  2002   CARPAL  TUNNEL RELEASE  2003-2010   twice on left; once on right   CHOLECYSTECTOMY  1987   DEBRIDEMENT TENNIS ELBOW  2010   EYE SURGERY Bilateral 11/2019   Lenses Implant   histerectomy     LUMBAR LAMINECTOMY/ DECOMPRESSION WITH MET-RX Right 06/01/2020   Procedure: Right Lumbar Four-Five Minimally invasive discectomy;  Surgeon: Cheryle Debby LABOR, MD;  Location: [facility] OR;  Service: Neurosurgery;  Laterality: Right;   RIGHT/LEFT HEART CATH AND CORONARY ANGIOGRAPHY N/A 08/07/2020   Procedure: RIGHT/LEFT HEART CATH AND CORONARY ANGIOGRAPHY;  Surgeon: Claudene Victory ORN, MD;  Location: [facility] INVASIVE CV LAB;  Service: Cardiovascular;  Laterality: N/A;   VAGINAL HYSTERECTOMY  1977   Social History   Occupational History   Occupation: retired  Tobacco Use   Smoking status: Former    Current packs/day: 0.00    Average packs/day: 0.8 packs/day for 6.0  years (4.5 ttl pk-yrs)    Types: Cigarettes    Start date: 01/20/1974    Quit date: 01/21/1980    Years since quitting: 43.5   Smokeless tobacco: Never  Vaping Use   Vaping status: Not on file  Substance and Sexual Activity   Alcohol use: No    Alcohol/week: 0.0 standard drinks of alcohol   Drug use: No   Sexual activity: Not Currently

## 2023-08-14 ENCOUNTER — Telehealth: Payer: Self-pay

## 2023-08-14 MED ORDER — FARXIGA 10 MG PO TABS: 10.0000 mg | ORAL_TABLET | Freq: Every day | ORAL | 0 refills | Status: DC

## 2023-08-14 NOTE — Telephone Encounter (Signed)
 Fax sent requesting Farxiga  10mg  refill, patient has an upcoming visit in 08/2023, however the last 3 visits have been cancelled. Sent to MD on call for advisement.

## 2023-08-20 ENCOUNTER — Ambulatory Visit: Admitting: Internal Medicine

## 2023-08-20 ENCOUNTER — Encounter: Payer: Self-pay | Admitting: Internal Medicine

## 2023-08-20 VITALS — BP 120/70 | HR 66 | Ht 63.0 in | Wt 193.4 lb

## 2023-08-20 DIAGNOSIS — Z794 Long term (current) use of insulin: Secondary | ICD-10-CM

## 2023-08-20 DIAGNOSIS — E785 Hyperlipidemia, unspecified: Secondary | ICD-10-CM | POA: Diagnosis not present

## 2023-08-20 DIAGNOSIS — E1122 Type 2 diabetes mellitus with diabetic chronic kidney disease: Secondary | ICD-10-CM

## 2023-08-20 DIAGNOSIS — N183 Chronic kidney disease, stage 3 unspecified: Secondary | ICD-10-CM | POA: Diagnosis not present

## 2023-08-20 DIAGNOSIS — E66811 Obesity, class 1: Secondary | ICD-10-CM | POA: Diagnosis not present

## 2023-08-20 LAB — POCT GLYCOSYLATED HEMOGLOBIN (HGB A1C): Hemoglobin A1C: 7.8 % — AB (ref 4.0–5.6)

## 2023-08-20 MED ORDER — LYUMJEV 100 UNIT/ML IJ SOLN: 6.0000 [IU] | Freq: Three times a day (TID) | INTRAMUSCULAR | 3 refills | Status: AC

## 2023-08-20 NOTE — Progress Notes (Signed)
 Patient ID: Yolanda Saunders, female   DOB: 1946-01-19, 78 y.o.   MRN: [id number]  HPI: Yolanda Saunders is a 78 y.o.-year-old female, returning for follow-up for DM2, dx 1999, insulin -dependent since ~2012, uncontrolled, with complications (CHF, CKD stage 3, h/o stroke post cardiac cath in 2002 and 06/2020, CVA 06/2020, macroalbuminuria). She saw Dr Faythe in the past.  Last visit with me 7 months ago.   She is here with her niece, who is her caregiver.  Interim history: After her stroke in 06/2020 >>  developed blindness in the L eye and also, R eye vision affected (worse), dyslexia. He lives in [facility]. She likes it there but mentions that she is eating much more than when she was at home. No increased urination, nausea. She has chest pain -cardiology aware.  Last hemoglobin A1c was: Lab Results  Component Value Date   HGBA1C 8.4 (A) 01/23/2023   HGBA1C 8.5 (A) 10/16/2022   HGBA1C 7.8 (A) 05/27/2022  09/21/2019: HbA1c 11.5% 07/31/2014: 13.4%   In the past, she had a period in which she could not afford analog insulin  products  >> we changed to:  Insulin  Before breakfast Before lunch Before dinner  Regular 25 - smaller meal 30 - larger meal 25 - smaller meal 30 - larger meal 25 - smaller meal 30 - larger meal  NPH 40  30    Currently on: - Lantus  40 units twice a day >> (vials)   30 >> 20 units in am >> off >> 8-10 units daily >> off >> restarted 20 units daily 09/2022>> 28-30 units daily - stopped 2 mo ago - Regular (ReliOn): 34 >> 15-20 >> 8-12 >> 8-10 units before meals >> stopped 09/2022 >> restarted 10-15 >> 6-12 units 2-3x a day - Farxiga  10 mg before b'fast Try to take 5-10 units if you have a snack after dinner.  We stopped Invokana  100 mg before b'fast >> lethargy, fatigue -  in 11/2015 I suggested Bydureon 2 mg weekly - in donut hole >> could not start Tried Victoza >> helped, but AP She stopped Metformin  ER 500 mg 2x a day because of AP, but does have  IBS. She feels better after she stopped Tried Byetta >> N/V Tried Metformin  >> AP, diarrhea, gas Tried SU >> upset stomach, nausea Has been on Invokana  before >> yeast inf She had elevated lipase (72) in 09/2011 while on Victoza.  She also reportedly had pancreatitis at the beginning of 2019.  At last visit she could not remember her blood sugars. She is checking 2-3x daily - per meter review: - am: 47, 52-119, 169, 173, 290  >> 115-182 >> 60-180 >> 120s >> 63, 83 - 2h after b'fast: 58, 214-265 >> 218 >> n/c >> 62, 64 >> n/c - before lunch:  62-144 >> 353/130s >> 63, 68-132, 162 >> n/c  - 2h after lunch: 73-199, 225, 266 >> 157-265 >> 180-200 >> n/c - before dinner:  395/180s >> 86-199 >> n/c >> 38  >> n/c >> 148-274 - 2h after dinner:  425/180-185 >> 73-160 >> 93-173 >> n/c >> 53, 54, 194-291 - bedtime:   56, 102-153, 202, 205 >> n/c >> 80-95 >> <250 >> 68, 85-314 - nighttime:  252-349 >> 402 >> 46, 178 >> n/c Lowest sugar was: 40s - at the [facility] >> 38 >> 120 >> 43.  she has hypoglycemia awareness in the 60s Highest sugar was 600s on Humalog  >> .SABRA.200 >> 250 >> 300 (  b'day party).   Glucometer: Free Style  Pt's meals are: - Breakfast: oatmeal - Lunch: sandwich; chicken nuggets and fries - Dinner: heaviest: meat + 2 veggies + dessert later - Snacks: too many - cookies, sugary snacks  -+ CKD - sees nephrology, last BUN/creatinine: Lab Results  Component Value Date   BUN 38 (H) 07/20/2023   CREATININE 1.94 (H) 07/20/2023   + MAU: No results found for: MICRALBCREAT 04/2014: ACR: >300 Intolerant to ACEI/ARBs. On Entresto .  -+ HL; last set of lipids: Lab Results  Component Value Date   CHOL 136 10/16/2022   HDL 53.00 10/16/2022   LDLCALC 65 10/16/2022   LDLDIRECT 144.0 09/24/2018   TRIG 94.0 10/16/2022   CHOLHDL 3 10/16/2022  07/04/2021: 133/39/52/69 09/21/2019: 236/255/37/151 07/09/2016: 175/122/36/150 On Crestor ? Dose << Lipitor  80 << 40.  - last eye exam was in  2022: + DR. She had cataract surgery.  -She denies numbness and tingling in her feet.  Last foot exam 05/27/2022.  She was seeing Dr. Tonita Blanch >> investigation for disequllibrium and word difficulty. ? MS. She also has HTN,  anemia, GERD. During investigation of her back pain in 02/2020, she was found to have diverticulitis.  This was treated.  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  They both passed away.  ROS: + see HPI  I reviewed pt's medications, allergies, PMH, social hx, family hx, and changes were documented in the history of present illness. Otherwise, unchanged from my initial visit note.  Past Medical History:  Diagnosis Date   Anemia 04/17/11   long, long years ago   Angina 04/16/11   that's what I'm here for   Anxiety    Arthritis    Hips and knees   Asthma    CAD in native artery 08/07/2021   Carpal tunnel syndrome    Cataracts, bilateral    Chronic combined systolic and diastolic heart failure (HCC) 08/07/2020   CKD (chronic kidney disease), stage III (HCC)    Complication of anesthesia 1987   affected my eyes; couldn't see anything but blurrs even the next day   CVA (cerebral infarction)    After cardiac catheter 02/2000   Depression    Edema    Fibromyalgia    GERD (gastroesophageal reflux disease)    Headache(784.0)    High cholesterol    History of bronchitis    Hypertension    IBS (irritable bowel syndrome)    Migraines    PAF (paroxysmal atrial fibrillation) (HCC) 08/07/2021   Panic attacks 04/17/11   don't take anything for it   Pneumonia 04/17/11   probably as many as 7 times   Renal disorder 04/17/11   they are working at 60% capacity   Shortness of breath on exertion    cause of my asthma   Stroke (HCC) 2002   residual problem w/using the right word, left 5 lesions on my brain/MRI; long term memory loss   Type II diabetes mellitus (HCC)    Past Surgical History:  Procedure Laterality Date   CARDIAC CATHETERIZATION  2002    CARPAL TUNNEL RELEASE  2003-2010   twice on left; once on right   CHOLECYSTECTOMY  1987   DEBRIDEMENT TENNIS ELBOW  2010   EYE SURGERY Bilateral 11/2019   Lenses Implant   histerectomy     LUMBAR LAMINECTOMY/ DECOMPRESSION WITH MET-RX Right 06/01/2020   Procedure: Right Lumbar Four-Five Minimally invasive discectomy;  Surgeon: Cheryle Debby LABOR, MD;  Location: [facility] OR;  Service: Neurosurgery;  Laterality: Right;   RIGHT/LEFT HEART CATH AND CORONARY ANGIOGRAPHY N/A 08/07/2020   Procedure: RIGHT/LEFT HEART CATH AND CORONARY ANGIOGRAPHY;  Surgeon: Claudene Victory ORN, MD;  Location: [facility] INVASIVE CV LAB;  Service: Cardiovascular;  Laterality: N/A;   VAGINAL HYSTERECTOMY  1977   Social History   Social History   Marital Status: Married   Social History Main Topics   Smoking status: Former Smoker -- 0.75 packs/day for 6 years    Types: Cigarettes    Quit date: 01/21/1980   Smokeless tobacco: Never Used   Alcohol Use: No   Drug Use: No   Social History Narrative   Lives with husband in a one story home.  Has 2 children.  Retired from [employer].  Education: 12th grade.  Trade schools.    Current Outpatient Medications on File Prior to Visit  Medication Sig Dispense Refill   allopurinol  (ZYLOPRIM ) 100 MG tablet Take 1 tablet by mouth once daily 30 tablet 0   apixaban  (ELIQUIS ) 2.5 MG TABS tablet Take 1 tablet (2.5 mg total) by mouth 2 (two) times daily. 180 tablet 3   atorvastatin  (LIPITOR ) 80 MG tablet Take 1 tablet (80 mg total) by mouth daily. 30 tablet 0   Cephalexin 250 MG tablet Take 250 mg by mouth daily. Daily for UTI prevention.     Cholecalciferol (VITAMIN D-3 PO) Take 1 capsule by mouth daily.     ENTRESTO  49-51 MG Take 1 tablet by mouth twice daily 180 tablet 2   FARXIGA  10 MG TABS tablet Take 1 tablet (10 mg total) by mouth daily. 60 tablet 0   gabapentin  (NEURONTIN ) 300 MG capsule Take 1 capsule by mouth at bedtime.     glucose blood (ONETOUCH VERIO) test strip USE AS  DIRECTED TO  CHECK  BLOOD  SUGAR  FOUR  TIMES  DAILY 400 each 3   insulin  glargine (LANTUS ) 100 UNIT/ML injection Inject 0.2 mLs (20 Units total) into the skin daily. 20 mL 3   insulin  regular (NOVOLIN R) 100 units/mL injection Inject 0.08-0.1 mLs (8-10 Units total) into the skin 2 (two) times daily before a meal. 30 mL 1   ondansetron  (ZOFRAN -ODT) 4 MG disintegrating tablet Take 1 tablet (4 mg total) by mouth every 8 (eight) hours as needed for nausea or vomiting. 20 tablet 0   pantoprazole  (PROTONIX ) 40 MG tablet Take 1 tablet (40 mg total) by mouth daily. 30 tablet 0   sertraline  (ZOLOFT ) 50 MG tablet Take 50 mg by mouth daily.     spironolactone  (ALDACTONE ) 25 MG tablet Take 1/2 tablet (12.5 mg total) by mouth daily. 30 tablet 1   torsemide  (DEMADEX ) 20 MG tablet Take 1 tablet (20 mg total) by mouth daily. 30 tablet 5   vitamin B-12 (CYANOCOBALAMIN ) 1000 MCG tablet Take 1,000 mcg by mouth daily.     No current facility-administered medications on file prior to visit.   Allergies  Allergen Reactions   Codeine Other (See Comments)    makes me crazy; see things; delusional   Erythromycin Rash and Other (See Comments)    Peeling skin Skin turned purplish    Penicillins Rash and Other (See Comments)    Skin blisters   Shellfish Allergy Anaphylaxis and Swelling    Throat swells   Sulfonamide Derivatives Hives   Zestril [Lisinopril] Cough   Toprol Xl [Metoprolol] Diarrhea and Nausea And Vomiting    Rapid weight gain Hallucinations    Actos [Pioglitazone] Other (See Comments)  Upset GI   Aldactone  [Spironolactone ] Other (See Comments)    Unknown reaction   Amaryl [Glimepiride] Other (See Comments)    Upset GI   Apresoline  [Hydralazine ] Other (See Comments)    Unknown reaction   Benicar [Olmesartan] Other (See Comments)    Dizziness Headaches   Byetta 10 Mcg Pen [Exenatide] Nausea And Vomiting   Catapres [Clonidine Hcl] Other (See Comments)    Unknown reaction   Flexeril   [Cyclobenzaprine ] Other (See Comments)    Drowsiness    Glucotrol [Glipizide] Other (See Comments)    Upset GI   Glyburide-Metformin  Other (See Comments)    Myalgias   Humalog  [Insulin  Lispro] Other (See Comments)    Headache Severe hyperglycemia   Invokana  [Canagliflozin ] Other (See Comments)    Yeast infections   Januvia [Sitagliptin] Other (See Comments)    UTI   Neurontin  [Gabapentin ] Other (See Comments)    Anxiety    Norvasc [Amlodipine] Other (See Comments)    Syncope   Prednisone Other (See Comments)    Unknown reaction   Propoxyphene Other (See Comments)    Unknown reaction Darvocet-N   Reglan [Metoclopramide] Other (See Comments)    Unknown reaction   Septra [Sulfamethoxazole-Trimethoprim] Hives   Trimethoprim    Ultram [Tramadol] Other (See Comments)    Anxiety    Victoza [Liraglutide] Diarrhea    Upset GI   Zocor [Simvastatin] Other (See Comments)    Myalgias    Cozaar [Losartan Potassium] Rash    Upset GI   Novolin 70-30 [Insulin  Nph Isophane & Regular] Swelling and Rash   Sulfa Antibiotics Hives and Rash   Family History  Problem Relation Age of Onset   Coronary artery disease Father    Diabetes Mellitus I Father    CVA Mother    Stroke Mother    Diabetes Mellitus I Mother    Diabetes Mellitus I Sister    Diabetes Mellitus I Brother    Diabetes Mellitus I Maternal Grandmother    Diabetes Mellitus I Maternal Grandfather    Diabetes Mellitus I Paternal Grandmother    Diabetes Mellitus I Paternal Grandfather    Diabetes Mellitus I Brother    Aortic aneurysm Son    PE: BP 120/70   Pulse 66   Ht 5' 3 (1.6 m)   Wt 193 lb 6.4 oz (87.7 kg)   SpO2 98%   BMI 34.26 kg/m   Wt Readings from Last 10 Encounters:  08/20/23 193 lb 6.4 oz (87.7 kg)  07/20/23 188 lb 9.6 oz (85.5 kg)  05/27/23 188 lb 3.2 oz (85.4 kg)  03/09/23 181 lb 14.4 oz (82.5 kg)  02/22/23 191 lb 5.8 oz (86.8 kg)  01/23/23 191 lb 6.4 oz (86.8 kg)  10/16/22 185 lb 6.4 oz (84.1 kg)   09/10/22 179 lb 9.6 oz (81.5 kg)  08/19/22 179 lb (81.2 kg)  06/06/22 174 lb (78.9 kg)   Constitutional: Slightly overweight, in NAD ENT: no neck masses, no cervical lymphadenopathy Cardiovascular: RRR, No MRG, + pitting edema B Respiratory: CTA B Musculoskeletal: no deformities Skin:no rashes Neurological: no tremor with outstretched hands Diabetic Foot Exam - Simple   Simple Foot Form Diabetic Foot exam was performed with the following findings: Yes 08/20/2023 12:10 PM  Visual Inspection No deformities, no ulcerations, no other skin breakdown bilaterally: Yes Sensation Testing See comments: Yes Pulse Check Posterior Tibialis and Dorsalis pulse intact bilaterally: Yes Comments Intact sensation to monofilament R foot, decreased global sensitivity to monofilament in L foot  ASSESSMENT: 1. DM2, insulin -dependent, uncontrolled, with complications - CHF - CKD stage 3 - h/o stroke post cardiac cath in 2002 - macroalbuminuria >> improved - Lipoatrophy at the site of insulin  injections  -Per patient's niece: she tried to obtain a CGM for the patient but she was not able to do so (2023)  2. Obesity class 1  3. HL  PLAN:  1. Patient with longstanding, uncontrolled, type 2 diabetes, insulin -dependent, previously on basal-bolus insulin  regimen and SGLT2 inhibitor, currently only on SGLT2 inhibitor in the regular insulin .  She came off Lantus  due to price but at last visit, sugars were improved per her report (she did not remember the sugars well and did not bring her meter or log), I did not recommend to add this back.  Also, the family wanted her on a more simpler regimen due to her memory loss. I did advise her to inject the regular insulin  30 minutes before meals.  HbA1c at that time was lower, but still above target, at 8.4%. -At today's visit, they remembered to bring her meter.  Reviewing the download, sugars appear to be very variable, without a consistent pattern.  She does  have a low blood sugars at different times of the day but she also has some blood sugars in the upper 200s-300s.  Upon questioning, she is trying to take the regular insulin  30 minutes before meals, but sometimes, like today, she may take the insulin  and not eat after 30 minutes.  We discussed that this is conducive to low blood sugars.  At today's visit, we discussed about possibly switching her regular insulin  to a more rapid acting insulin , like Lyumjev .  I believe that we could get better control while on this insulin .  Her niece is concerned about pricing about the safety of this insulin  and I advised her that this is actually a safer insulin , which can be taken right at the start of the meal and does not require planning ahead.  I also advised her to only take the 12 unit dose with large meals, but otherwise to use lower doses.  Will continue Farxiga  for now. - I advised her to:  Patient Instructions  Please continue: - Farxiga  10 mg before b'fast   Change: - Lyumjev  insulin  6-12 units 2-3x a day before meals - inject right before eating  Please return in 3-4 months.  - we checked her HbA1c: 7.8% (lower) - advised to check sugars at different times of the day - 3x a day, rotating check times - advised for yearly eye exams >> she is not UTD - return to clinic in 3-4 months  2. . Obesity class 1 -We had her on Farxiga  which would have help with weight loss, but unfortunately this is not affordable anymore. -she gained 6 more lbs before last visit and 3 more since then  3. HL - Latest lipid panel was reviewed from 09/2022: LDL much improved from baseline, but still slightly above our target of less than 55 due to cardiovascular disease, otherwise fractions at goal: Lab Results  Component Value Date   CHOL 136 10/16/2022   HDL 53.00 10/16/2022   LDLCALC 65 10/16/2022   LDLDIRECT 144.0 09/24/2018   TRIG 94.0 10/16/2022   CHOLHDL 3 10/16/2022  - She was on Lipitor  80 mg daily, but her  niece is telling me that she is already switched to Crestor (? Dose) without side effects  Lela Fendt, MD PhD [facility] Endocrin[facility]

## 2023-08-20 NOTE — Patient Instructions (Addendum)
 Please continue: - Farxiga  10 mg before b'fast   Change: - Lyumjev  insulin  6-12 units 2-3x a day before meals - inject right before eating  Please return in 3-4 months.

## 2023-08-27 ENCOUNTER — Other Ambulatory Visit: Payer: Self-pay

## 2023-08-27 ENCOUNTER — Ambulatory Visit: Admitting: Sports Medicine

## 2023-08-27 DIAGNOSIS — Z794 Long term (current) use of insulin: Secondary | ICD-10-CM

## 2023-08-27 DIAGNOSIS — N183 Chronic kidney disease, stage 3 unspecified: Secondary | ICD-10-CM

## 2023-08-27 DIAGNOSIS — M12811 Other specific arthropathies, not elsewhere classified, right shoulder: Secondary | ICD-10-CM

## 2023-08-27 DIAGNOSIS — G8929 Other chronic pain: Secondary | ICD-10-CM

## 2023-08-27 DIAGNOSIS — E1122 Type 2 diabetes mellitus with diabetic chronic kidney disease: Secondary | ICD-10-CM

## 2023-08-27 DIAGNOSIS — M25511 Pain in right shoulder: Secondary | ICD-10-CM | POA: Diagnosis not present

## 2023-08-27 NOTE — Progress Notes (Unsigned)
 Yolanda Saunders - 78 y.o. female MRN [id number]  Date of birth: 06-Dec-1945  Office Visit Note: Visit Date: 08/27/2023 PCP: [name], MD Referred by: [name], MD  Subjective: Chief Complaint  Patient presents with   Right Shoulder - Pain   HPI: Yolanda Saunders is a pleasant 78 y.o. female who presents today for acute on chronic right shoulder pain.  Had been seen previously for right shoulder pain.  Thought to possibly be rotator cuff arthropathy versus degree of adhesive capsulitis.  Sent here for further evaluation and possible ultrasound-guided injection. Yolanda Saunders has had 2-3 months of rather intense right shoulder pain that feels deep within the joint.  She has pain with lifting and moving as well as pain with sleeping at nighttime.  She has been using Tylenol  as well as tramadol at times for pain without much relief.  She is a type-II diabetic. She is managed on Lantus  (20u) daily as well as mealtime insulin  TID, Farxiga  10mg  every day. She has had CSI injections before and does know how to adjust insulin  if needed for bG rise.  Lab Results  Component Value Date   HGBA1C 7.8 (A) 08/20/2023   Pertinent ROS were reviewed with the patient and found to be negative unless otherwise specified above in HPI.   Assessment & Plan: Visit Diagnoses:  1. Chronic right shoulder pain   2. Rotator cuff arthropathy of right shoulder   3. Type 2 diabetes mellitus with stage 3 chronic kidney disease, with long-term current use of insulin , unspecified whether stage 3a or 3b CKD (HCC)    Plan: Impression is acute on chronic right shoulder pain with an exam if it is more of a rotator cuff arthropathy but also some stiffness secondary to guarding and pain.  However, I have a lower suspicion for adhesive capsulitis at this time.  Through shared decision making, we did proceed with ultrasound-guided glenohumeral joint injection, patient tolerated well.  She will check for transient  rise of blood glucose, will check sugars and modify her short acting and long-acting insulin  as indicated.  Continue Farxiga  10 mg daily.  In terms of pain control, she may continue Tylenol  as well as tramadol 50 mg as needed for breakthrough pain.  Hopefully after a few days her pain settles down and she does not need pharmacologic management.  She could benefit from formalized physical therapy or home exercise regimen, although would like to have her severe pain settle down with the injection first.  She will follow-up with Dr. Jerri or myself over the next few weeks if needed.  Meds & Orders: No orders of the defined types were placed in this encounter.   Orders Placed This Encounter  Procedures   Large Joint Inj: R glenohumeral   US  Guided Needle Placement - No Linked Charges     Procedures: Large Joint Inj: R glenohumeral on 08/27/2023 2:47 PM Indications: pain Details: 22 G 3.5 in needle, ultrasound-guided posterior approach Medications: 2 mL lidocaine  1 %; 2 mL bupivacaine  0.25 %; 40 mg methylPREDNISolone  acetate 40 MG/ML Outcome: tolerated well, no immediate complications  US -guided glenohumeral joint injection, Right shoulder After discussion on risks/benefits/indications, informed verbal consent was obtained. A timeout was then performed. The patient was positioned lying lateral recumbent on examination table. The patient's shoulder was prepped with betadine and multiple alcohol swabs and utilizing ultrasound guidance, the patient's glenohumeral joint was identified on ultrasound. Using ultrasound guidance a 22-gauge, 3.5 inch needle with a mixture of  2:2:1 cc's lidocaine :bupivicaine:depomedrol was directed from a lateral to medial direction via in-plane technique into the glenohumeral joint with visualization of appropriate spread of injectate into the joint. Patient tolerated the procedure well without immediate complications.      Procedure, treatment alternatives, risks and benefits  explained, specific risks discussed. Consent was given by the patient. Immediately prior to procedure a time out was called to verify the correct patient, procedure, equipment, support staff and site/side marked as required. Patient was prepped and draped in the usual sterile fashion.          Clinical History: No specialty comments available.  She reports that she quit smoking about 43 years ago. Her smoking use included cigarettes. She started smoking about 49 years ago. She has a 4.5 pack-year smoking history. She has never used smokeless tobacco.  Recent Labs    10/16/22 1622 01/23/23 1501 08/20/23 1211  HGBA1C 8.5* 8.4* 7.8*    Objective:    Physical Exam  Gen: Well-appearing, in no acute distress; non-toxic CV: Well-perfused. Warm.  Resp: Breathing unlabored on room air; no wheezing. Psych: Fluid speech in conversation; appropriate affect; normal thought process  Ortho Exam - Right shoulder: There is no redness swelling or effusion.  There is pain in all planes of motion with restriction in active range of motion.  I am able to take her to near full range of motion passively albeit with pain more so with flexion and abduction, she does have preserved active external range of motion and preserved passive internal range of motion.  Imaging:  *3 views of the right shoulder including AP, scapular Y and axial view were independently reviewed and interpreted by myself today.  X-rays demonstrate no significant arthritic change, mild AC joint arthropathy.  There is mild sclerosis over the tuberosity, possibly indicative of impingement.  No acute fracture noted.  Past Medical/Family/Surgical/Social History: Medications & Allergies reviewed per EMR, new medications updated. Patient Active Problem List   Diagnosis Date Noted   OSA (obstructive sleep apnea) 10/24/2021   Acute hypercapnic respiratory failure (HCC) 10/03/2021   PAF (paroxysmal atrial fibrillation) (HCC) 08/07/2021    CAD in native artery 08/07/2021   Acute hypoxemic respiratory failure (HCC) 07/30/2021   Acute pulmonary edema (HCC) 07/30/2021   Chronic combined systolic and diastolic heart failure (HCC) 08/07/2020   Stroke (cerebrum) (HCC) 08/03/2020   Stroke (HCC) 08/02/2020   Lumbar radiculopathy 06/01/2020   Plantar fasciitis 06/03/2017   Syncope 10/15/2016   CKD (chronic kidney disease), stage III (HCC) 10/15/2016   Type 2 diabetes mellitus with stage 3 chronic kidney disease, with long-term current use of insulin  (HCC) 08/07/2015   Bilateral leg weakness 06/06/2014   Abnormality of gait 06/06/2014   Lumbosacral stenosis 06/06/2014   Chest pain 04/16/2011   Hyperlipidemia 09/13/2008   Class 1 obesity 09/13/2008   Essential hypertension 09/13/2008   Cerebral artery occlusion with cerebral infarction (HCC) 09/13/2008   Asthma 09/13/2008   IRRITABLE BOWEL SYNDROME 09/13/2008   Calculus of gallbladder 09/13/2008   UTI 09/13/2008   Arthropathy 09/13/2008   FIBROMYALGIA 09/13/2008   Headache 09/13/2008   Past Medical History:  Diagnosis Date   Anemia 04/17/11   long, long years ago   Angina 04/16/11   that's what I'm here for   Anxiety    Arthritis    Hips and knees   Asthma    CAD in native artery 08/07/2021   Carpal tunnel syndrome    Cataracts, bilateral    Chronic combined systolic  and diastolic heart failure (HCC) 08/07/2020   CKD (chronic kidney disease), stage III (HCC)    Complication of anesthesia 1987   affected my eyes; couldn't see anything but blurrs even the next day   CVA (cerebral infarction)    After cardiac catheter 02/2000   Depression    Edema    Fibromyalgia    GERD (gastroesophageal reflux disease)    Headache(784.0)    High cholesterol    History of bronchitis    Hypertension    IBS (irritable bowel syndrome)    Migraines    PAF (paroxysmal atrial fibrillation) (HCC) 08/07/2021   Panic attacks 04/17/11   don't take anything for it   Pneumonia  04/17/11   probably as many as 7 times   Renal disorder 04/17/11   they are working at 60% capacity   Shortness of breath on exertion    cause of my asthma   Stroke (HCC) 2002   residual problem w/using the right word, left 5 lesions on my brain/MRI; long term memory loss   Type II diabetes mellitus (HCC)    Family History  Problem Relation Age of Onset   Coronary artery disease Father    Diabetes Mellitus I Father    CVA Mother    Stroke Mother    Diabetes Mellitus I Mother    Diabetes Mellitus I Sister    Diabetes Mellitus I Brother    Diabetes Mellitus I Maternal Grandmother    Diabetes Mellitus I Maternal Grandfather    Diabetes Mellitus I Paternal Grandmother    Diabetes Mellitus I Paternal Grandfather    Diabetes Mellitus I Brother    Aortic aneurysm Son    Past Surgical History:  Procedure Laterality Date   CARDIAC CATHETERIZATION  2002   CARPAL TUNNEL RELEASE  2003-2010   twice on left; once on right   CHOLECYSTECTOMY  1987   DEBRIDEMENT TENNIS ELBOW  2010   EYE SURGERY Bilateral 11/2019   Lenses Implant   histerectomy     LUMBAR LAMINECTOMY/ DECOMPRESSION WITH MET-RX Right 06/01/2020   Procedure: Right Lumbar Four-Five Minimally invasive discectomy;  Surgeon: Cheryle Debby LABOR, MD;  Location: [facility] OR;  Service: Neurosurgery;  Laterality: Right;   RIGHT/LEFT HEART CATH AND CORONARY ANGIOGRAPHY N/A 08/07/2020   Procedure: RIGHT/LEFT HEART CATH AND CORONARY ANGIOGRAPHY;  Surgeon: Claudene Victory ORN, MD;  Location: [facility];  Service: Cardiovascular;  Laterality: N/A;   VAGINAL HYSTERECTOMY  1977   Social History   Occupational History   Occupation: retired  Tobacco Use   Smoking status: Former    Current packs/day: 0.00    Average packs/day: 0.8 packs/day for 6.0 years (4.5 ttl pk-yrs)    Types: Cigarettes    Start date: 01/20/1974    Quit date: 01/21/1980    Years since quitting: 43.6   Smokeless tobacco: Never  Vaping Use   Vaping status: Not on  file  Substance and Sexual Activity   Alcohol use: No    Alcohol/week: 0.0 standard drinks of alcohol   Drug use: No   Sexual activity: Not Currently

## 2023-08-28 ENCOUNTER — Encounter: Payer: Self-pay | Admitting: Sports Medicine

## 2023-08-28 MED ORDER — METHYLPREDNISOLONE ACETATE 40 MG/ML IJ SUSP
40.0000 mg | INTRAMUSCULAR | Status: AC | PRN
  Administered 2023-08-27: 40 mg via INTRA_ARTICULAR

## 2023-08-28 MED ORDER — LIDOCAINE HCL 1 % IJ SOLN
2.0000 mL | INTRAMUSCULAR | Status: AC | PRN
  Administered 2023-08-27: 2 mL

## 2023-08-28 MED ORDER — BUPIVACAINE HCL 0.25 % IJ SOLN
2.0000 mL | INTRAMUSCULAR | Status: AC | PRN
  Administered 2023-08-27: 2 mL via INTRA_ARTICULAR

## 2023-09-16 ENCOUNTER — Ambulatory Visit: Admitting: Internal Medicine

## 2023-09-22 ENCOUNTER — Ambulatory Visit: Admitting: Orthopaedic Surgery

## 2023-10-27 DIAGNOSIS — E162 Hypoglycemia, unspecified: Secondary | ICD-10-CM | POA: Diagnosis not present

## 2023-10-27 DIAGNOSIS — R42 Dizziness and giddiness: Secondary | ICD-10-CM | POA: Diagnosis not present

## 2023-11-04 ENCOUNTER — Other Ambulatory Visit (HOSPITAL_BASED_OUTPATIENT_CLINIC_OR_DEPARTMENT_OTHER): Payer: Self-pay | Admitting: Cardiovascular Disease

## 2023-11-19 DIAGNOSIS — W108XXA Fall (on) (from) other stairs and steps, initial encounter: Secondary | ICD-10-CM | POA: Diagnosis not present

## 2023-11-19 DIAGNOSIS — L218 Other seborrheic dermatitis: Secondary | ICD-10-CM | POA: Diagnosis not present

## 2023-12-07 ENCOUNTER — Other Ambulatory Visit: Payer: Self-pay | Admitting: Internal Medicine

## 2023-12-07 NOTE — Telephone Encounter (Signed)
 Refill request complete

## 2023-12-08 NOTE — Progress Notes (Signed)
 Yolanda Saunders                                          MRN: [id number]   12/08/2023   The [facility] Quality Team Specialist reviewed this patient medical record for the purposes of chart review for care gap closure. The following were reviewed: chart review for care gap closure-kidney health evaluation for diabetes:eGFR  and uACR.    [facility] Quality Team

## 2023-12-21 ENCOUNTER — Encounter: Payer: Self-pay | Admitting: Internal Medicine

## 2023-12-21 ENCOUNTER — Ambulatory Visit: Admitting: Internal Medicine

## 2023-12-21 VITALS — BP 120/70 | HR 73 | Ht 63.0 in | Wt 195.8 lb

## 2023-12-21 DIAGNOSIS — E785 Hyperlipidemia, unspecified: Secondary | ICD-10-CM | POA: Diagnosis not present

## 2023-12-21 DIAGNOSIS — Z794 Long term (current) use of insulin: Secondary | ICD-10-CM | POA: Diagnosis not present

## 2023-12-21 DIAGNOSIS — N183 Chronic kidney disease, stage 3 unspecified: Secondary | ICD-10-CM

## 2023-12-21 DIAGNOSIS — E1122 Type 2 diabetes mellitus with diabetic chronic kidney disease: Secondary | ICD-10-CM

## 2023-12-21 DIAGNOSIS — E66811 Obesity, class 1: Secondary | ICD-10-CM

## 2023-12-21 LAB — POCT GLYCOSYLATED HEMOGLOBIN (HGB A1C): Hemoglobin A1C: 8.1 % — AB (ref 4.0–5.6)

## 2023-12-21 MED ORDER — FREESTYLE LIBRE 3 PLUS SENSOR MISC: 1.0000 | 3 refills | Status: AC

## 2023-12-21 MED ORDER — INSULIN GLARGINE 100 UNIT/ML ~~LOC~~ SOLN: 15.0000 [IU] | Freq: Every day | SUBCUTANEOUS | 3 refills | Status: AC

## 2023-12-21 MED ORDER — FREESTYLE LIBRE 3 READER DEVI: 1.0000 | Freq: Once | 0 refills | Status: AC

## 2023-12-21 NOTE — Patient Instructions (Addendum)
 Please continue: - Farxiga  10 mg before b'fast   Please restart: - Lantus  15 units daily at waking up  Change: - Lyumjev  insulin : 6 units before a smaller meal 10 units before a larger meal   Please return in 3 months.

## 2023-12-21 NOTE — Progress Notes (Signed)
 Patient ID: Yolanda Saunders, female   DOB: 01/24/1945, 78 y.o.   MRN: [id number]  HPI: Yolanda Saunders is a 78 y.o.-year-old female, returning for follow-up for DM2, dx 1999, insulin -dependent since ~2012, uncontrolled, with complications (CHF, CKD stage 3, h/o stroke post cardiac cath in 2002 and 06/2020, CVA 06/2020, macroalbuminuria). She saw Dr Faythe in the past.  Last visit with me 4 months ago.   She is here with her female companion.  Interim history: After her stroke in 06/2020 >>  developed blindness in the L eye and also, R eye vision affected (worse), dyslexia. He lives in [facility]. She likes it there but mentions that she is eating much more than when she was at home. No increased urination, nausea, CP. At today's visit, she tells me she takes insulin  20 units 3 times a day but unclear exactly which insulin .  I am assuming it is Lyumjev .  She is not sure whether she is taking Farxiga  or not.  Last hemoglobin A1c was: Lab Results  Component Value Date   HGBA1C 7.8 (A) 08/20/2023   HGBA1C 8.4 (A) 01/23/2023   HGBA1C 8.5 (A) 10/16/2022  09/21/2019: HbA1c 11.5% 07/31/2014: 13.4%   In the past, she had a period in which she could not afford analog insulin  products  >> we changed to:  Insulin  Before breakfast Before lunch Before dinner  Regular 25 - smaller meal 30 - larger meal 25 - smaller meal 30 - larger meal 25 - smaller meal 30 - larger meal  NPH 40  30    Currently on: -  stopped 05/2023 - Regular (ReliOn) insulin : 34 >> 15-20 >> ...restarted 10-15 >> 6-12 units 2-3x a day >> Lyumjev  6-12 units 2-3x a day before meals. Try to take 5-10 units if you have a snack after dinner. - Farxiga  10 mg before b'fast  We stopped Invokana  100 mg before b'fast >> lethargy, fatigue -  in 11/2015 I suggested Bydureon 2 mg weekly - in donut hole >> could not start Tried Victoza >> helped, but AP She stopped Metformin  ER 500 mg 2x a day because of AP, but does have IBS.  She feels better after she stopped Tried Byetta >> N/V Tried Metformin  >> AP, diarrhea, gas Tried SU >> upset stomach, nausea Has been on Invokana  before >> yeast inf She had elevated lipase (72) in 09/2011 while on Victoza.  She also reportedly had pancreatitis at the beginning of 2019.  At last visit she could not remember her blood sugars. She is checking 2-3x daily - per meter review: - am: 115-182 >> 60-180 >> 120s >> 63, 83 >> 107 - 2h after b'fast: 218 >> n/c >> 62, 64 >> n/c >> 234 - before lunch:  353/130s >> 63, 68-132, 162 >> n/c  - 2h after lunch: 157-265 >> 180-200 >> n/c - before dinner:  n/c >> 38  >> n/c >> 148-274 >> 51-200 - 2h after dinner:  93-173 >> n/c >> 53, 54, 194-291 >> 46-250 - bedtime: n/c >> 80-95 >> <250 >> 68, 85-314 >> 54-335, 407 - nighttime:  252-349 >> 402 >> 46, 178 >> n/c Lowest sugar was: 38 >> 120 >> 43 >> 46.  she has hypoglycemia awareness in the 60s Highest sugar was 600s on Humalog  >> .SABRA.250 >> 300 (b'day party) >> 512 (Txgiving dinner)  Glucometer: Free Style  Pt's meals are: - Breakfast: oatmeal - Lunch: sandwich; chicken nuggets and fries - Dinner: heaviest: meat + 2  veggies + dessert later - Snacks: too many - cookies, sugary snacks  -+ CKD - sees nephrology, last BUN/creatinine: Lab Results  Component Value Date   BUN 38 (H) 07/20/2023   CREATININE 1.94 (H) 07/20/2023   + MAU: No results found for: MICRALBCREAT 04/2014: ACR: >300 Intolerant to ACEI/ARBs. On Entresto .  -+ HL; last set of lipids: Lab Results  Component Value Date   CHOL 136 10/16/2022   HDL 53.00 10/16/2022   LDLCALC 65 10/16/2022   LDLDIRECT 144.0 09/24/2018   TRIG 94.0 10/16/2022   CHOLHDL 3 10/16/2022  07/04/2021: 133/39/52/69 09/21/2019: 236/255/37/151 07/09/2016: 175/122/36/150 On Crestor ? Dose << Lipitor  80 << 40.  - last eye exam was in 2022: + DR. She had cataract surgery.  -She denies numbness and tingling in her feet.  Last foot exam  08/20/2023.  She was seeing Dr. Tonita Blanch >> investigation for disequllibrium and word difficulty. ? MS. She also has HTN,  anemia, GERD. During investigation of her back pain in 02/2020, she was found to have diverticulitis.  This was treated.  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  They both passed away.  ROS: + see HPI  I reviewed pt's medications, allergies, PMH, social hx, family hx, and changes were documented in the history of present illness. Otherwise, unchanged from my initial visit note.  Past Medical History:  Diagnosis Date   Anemia 04/17/11   long, long years ago   Angina 04/16/11   that's what I'm here for   Anxiety    Arthritis    Hips and knees   Asthma    CAD in native artery 08/07/2021   Carpal tunnel syndrome    Cataracts, bilateral    Chronic combined systolic and diastolic heart failure (HCC) 08/07/2020   CKD (chronic kidney disease), stage III (HCC)    Complication of anesthesia 1987   affected my eyes; couldn't see anything but blurrs even the next day   CVA (cerebral infarction)    After cardiac catheter 02/2000   Depression    Edema    Fibromyalgia    GERD (gastroesophageal reflux disease)    Headache(784.0)    High cholesterol    History of bronchitis    Hypertension    IBS (irritable bowel syndrome)    Migraines    PAF (paroxysmal atrial fibrillation) (HCC) 08/07/2021   Panic attacks 04/17/11   don't take anything for it   Pneumonia 04/17/11   probably as many as 7 times   Renal disorder 04/17/11   they are working at 60% capacity   Shortness of breath on exertion    cause of my asthma   Stroke (HCC) 2002   residual problem w/using the right word, left 5 lesions on my brain/MRI; long term memory loss   Type II diabetes mellitus (HCC)    Past Surgical History:  Procedure Laterality Date   CARDIAC CATHETERIZATION  2002   CARPAL TUNNEL RELEASE  2003-2010   twice on left; once on right   CHOLECYSTECTOMY  1987    DEBRIDEMENT TENNIS ELBOW  2010   EYE SURGERY Bilateral 11/2019   Lenses Implant   histerectomy     LUMBAR LAMINECTOMY/ DECOMPRESSION WITH MET-RX Right 06/01/2020   Procedure: Right Lumbar Four-Five Minimally invasive discectomy;  Surgeon: Cheryle Debby LABOR, MD;  Location: [facility] OR;  Service: Neurosurgery;  Laterality: Right;   RIGHT/LEFT HEART CATH AND CORONARY ANGIOGRAPHY N/A 08/07/2020   Procedure: RIGHT/LEFT HEART CATH AND CORONARY ANGIOGRAPHY;  Surgeon: Claudene,  Victory ORN, MD;  Location: [facility] INVASIVE CV LAB;  Service: Cardiovascular;  Laterality: N/A;   VAGINAL HYSTERECTOMY  1977   Social History   Social History   Marital Status: Married   Social History Main Topics   Smoking status: Former Smoker -- 0.75 packs/day for 6 years    Types: Cigarettes    Quit date: 01/21/1980   Smokeless tobacco: Never Used   Alcohol Use: No   Drug Use: No   Social History Narrative   Lives with husband in a one story home.  Has 2 children.  Retired from [employer].  Education: 12th grade.  Trade schools.    Current Outpatient Medications on File Prior to Visit  Medication Sig Dispense Refill   allopurinol  (ZYLOPRIM ) 100 MG tablet Take 1 tablet by mouth once daily 30 tablet 0   apixaban  (ELIQUIS ) 2.5 MG TABS tablet Take 1 tablet (2.5 mg total) by mouth 2 (two) times daily. 180 tablet 3   atorvastatin  (LIPITOR ) 80 MG tablet Take 1 tablet (80 mg total) by mouth daily. 30 tablet 0   Cephalexin 250 MG tablet Take 250 mg by mouth daily. Daily for UTI prevention.     Cholecalciferol (VITAMIN D-3 PO) Take 1 capsule by mouth daily.     ENTRESTO  49-51 MG Take 1 tablet by mouth twice daily 180 tablet 2   FARXIGA  10 MG TABS tablet Take 1 tablet by mouth once daily 60 tablet 0   gabapentin  (NEURONTIN ) 300 MG capsule Take 1 capsule by mouth at bedtime.     glucose blood (ONETOUCH VERIO) test strip USE AS DIRECTED TO  CHECK  BLOOD  SUGAR  FOUR  TIMES  DAILY 400 each 3   insulin  glargine (LANTUS ) 100 UNIT/ML  injection Inject 0.2 mLs (20 Units total) into the skin daily. 20 mL 3   Insulin  Lispro-aabc (LYUMJEV ) 100 UNIT/ML SOLN Inject 6-12 Units as directed 3 (three) times daily before meals. 20 mL 3   insulin  regular (NOVOLIN R) 100 units/mL injection Inject 0.08-0.1 mLs (8-10 Units total) into the skin 2 (two) times daily before a meal. 30 mL 1   ondansetron  (ZOFRAN -ODT) 4 MG disintegrating tablet Take 1 tablet (4 mg total) by mouth every 8 (eight) hours as needed for nausea or vomiting. 20 tablet 0   pantoprazole  (PROTONIX ) 40 MG tablet Take 1 tablet (40 mg total) by mouth daily. 30 tablet 0   sertraline  (ZOLOFT ) 50 MG tablet Take 50 mg by mouth daily.     spironolactone  (ALDACTONE ) 25 MG tablet Take 1/2 tablet (12.5 mg total) by mouth daily. 30 tablet 1   torsemide  (DEMADEX ) 20 MG tablet Take 1 tablet (20 mg total) by mouth daily. 30 tablet 5   vitamin B-12 (CYANOCOBALAMIN ) 1000 MCG tablet Take 1,000 mcg by mouth daily.     No current facility-administered medications on file prior to visit.   Allergies  Allergen Reactions   Codeine Other (See Comments)    makes me crazy; see things; delusional   Erythromycin Rash and Other (See Comments)    Peeling skin Skin turned purplish    Penicillins Rash and Other (See Comments)    Skin blisters   Shellfish Allergy Anaphylaxis and Swelling    Throat swells   Sulfonamide Derivatives Hives   Zestril [Lisinopril] Cough   Toprol Xl [Metoprolol] Diarrhea and Nausea And Vomiting    Rapid weight gain Hallucinations    Actos [Pioglitazone] Other (See Comments)    Upset GI   Aldactone  [Spironolactone ] Other (  See Comments)    Unknown reaction   Amaryl [Glimepiride] Other (See Comments)    Upset GI   Apresoline  [Hydralazine ] Other (See Comments)    Unknown reaction   Benicar [Olmesartan] Other (See Comments)    Dizziness Headaches   Byetta 10 Mcg Pen [Exenatide] Nausea And Vomiting   Catapres [Clonidine Hcl] Other (See Comments)    Unknown  reaction   Flexeril  [Cyclobenzaprine ] Other (See Comments)    Drowsiness    Glucotrol [Glipizide] Other (See Comments)    Upset GI   Glyburide-Metformin  Other (See Comments)    Myalgias   Humalog  [Insulin  Lispro] Other (See Comments)    Headache Severe hyperglycemia   Invokana  [Canagliflozin ] Other (See Comments)    Yeast infections   Januvia [Sitagliptin] Other (See Comments)    UTI   Neurontin  [Gabapentin ] Other (See Comments)    Anxiety    Norvasc [Amlodipine] Other (See Comments)    Syncope   Prednisone Other (See Comments)    Unknown reaction   Propoxyphene Other (See Comments)    Unknown reaction Darvocet-N   Reglan [Metoclopramide] Other (See Comments)    Unknown reaction   Septra [Sulfamethoxazole-Trimethoprim] Hives   Trimethoprim    Ultram [Tramadol] Other (See Comments)    Anxiety    Victoza [Liraglutide] Diarrhea    Upset GI   Zocor [Simvastatin] Other (See Comments)    Myalgias    Cozaar [Losartan Potassium] Rash    Upset GI   Novolin 70-30 [Insulin  Nph Isophane & Regular] Swelling and Rash   Sulfa Antibiotics Hives and Rash   Family History  Problem Relation Age of Onset   Coronary artery disease Father    Diabetes Mellitus I Father    CVA Mother    Stroke Mother    Diabetes Mellitus I Mother    Diabetes Mellitus I Sister    Diabetes Mellitus I Brother    Diabetes Mellitus I Maternal Grandmother    Diabetes Mellitus I Maternal Grandfather    Diabetes Mellitus I Paternal Grandmother    Diabetes Mellitus I Paternal Grandfather    Diabetes Mellitus I Brother    Aortic aneurysm Son    PE: BP 120/70   Pulse 73   Ht 5' 3 (1.6 m)   Wt 195 lb 12.8 oz (88.8 kg)   SpO2 97%   BMI 34.68 kg/m   Wt Readings from Last 10 Encounters:  12/21/23 195 lb 12.8 oz (88.8 kg)  08/20/23 193 lb 6.4 oz (87.7 kg)  07/20/23 188 lb 9.6 oz (85.5 kg)  05/27/23 188 lb 3.2 oz (85.4 kg)  03/09/23 181 lb 14.4 oz (82.5 kg)  02/22/23 191 lb 5.8 oz (86.8 kg)  01/23/23  191 lb 6.4 oz (86.8 kg)  10/16/22 185 lb 6.4 oz (84.1 kg)  09/10/22 179 lb 9.6 oz (81.5 kg)  08/19/22 179 lb (81.2 kg)   Constitutional: Slightly overweight, in NAD ENT: no neck masses, no cervical lymphadenopathy Cardiovascular: RRR, No MRG, + pitting edema B Respiratory: CTA B Musculoskeletal: no deformities Skin:no rashes Neurological: no tremor with outstretched hands  ASSESSMENT: 1. DM2, insulin -dependent, uncontrolled, with complications - CHF - CKD stage 3 - h/o stroke post cardiac cath in 2002 - macroalbuminuria >> improved - Lipoatrophy at the site of insulin  injections  -Per patient's niece: she tried to obtain a CGM for the patient but she was not able to do so (2023)  2. Obesity class 1  3. HL  PLAN:  1. Patient with longstanding, uncontrolled, type 2  diabetes, insulin -dependent, previously on a basal-bolus insulin  regimen and SGLT2 inhibitor, currently only on SGLT2 inhibitor and Lyumjev  insulin , which was switched from regular insulin  at last visit to allow for dosing flexibility.  We discussed that while regular insulin  was ideally taken 30 minutes before meals, Lyumjev  could be taken right at the start of the meal.  Sugars are very variable overall, without a consistent pattern.  She had low blood sugars at different times of the day but she also had some high blood sugars in the upper 200s and 300s. -At today's visit, unfortunately, it is not completely clear which insulin  she is taking, presumably Lyumjev .  She is taking a higher dose than recommended, instead of taking 6 to 12 minutes before meals, she is taking 20 units before each meal. Sugars are very variable, from 40s to 500s w/o a clear pattern.  I recommended to add back Lantus  at a lower dose and decrease the dose of Lyumjev  (I advised her to look at home and let me know if she is taking any other insulin ).  I also advised her to make sure she is taking Farxiga . -At today's visit, we discussed with patient and  her friend about possibly using a CGM.  I explained how this worked.  She agrees to try this. - I advised her to:  Patient Instructions  Please continue: - Farxiga  10 mg before b'fast   Please restart: - Lantus  15 units daily at waking up  Change: - Lyumjev  insulin : 6 units before a smaller meal 10 units before a larger meal   Please return in 3 months.  - we checked her HbA1c: 7.8% (lower) - advised to check sugars at different times of the day - 3x a day, rotating check times - advised for yearly eye exams >> she is not UTD - return to clinic in 3-4 months  2. Obesity class 1 - On Farxiga , which should also help with weight loss - She gained 9 pounds before the last visit combined and gained 2 pounds since last visit  3. HL - Latest lipid panel reviewed from 09/2022: Fractions at goal except LDL, which, while greatly improved from baseline, was still above our target of less than 55: Lab Results  Component Value Date   CHOL 136 10/16/2022   HDL 53.00 10/16/2022   LDLCALC 65 10/16/2022   LDLDIRECT 144.0 09/24/2018   TRIG 94.0 10/16/2022   CHOLHDL 3 10/16/2022  - Previously on Lipitor  80 mg daily but she was switched to Crestor per her niece's report at last visit (?  Dose)  Lela Fendt, MD PhD [facility]

## 2023-12-22 ENCOUNTER — Telehealth: Payer: Self-pay

## 2023-12-22 ENCOUNTER — Other Ambulatory Visit (HOSPITAL_COMMUNITY): Payer: Self-pay

## 2023-12-22 NOTE — Telephone Encounter (Signed)
 PA is required for [employer] 3 + Sensor 15 D kit. Thank you.

## 2023-12-22 NOTE — Telephone Encounter (Signed)
 Pharmacy Patient Advocate Encounter   Received notification from Pt Calls Messages that prior authorization for Freestyle libre 3 plus sensor is required/requested.   Insurance verification completed.   The patient is insured through [employer].   Per test claim: 30 day fill is showing a copay of $0, however, it states it needs to be run through Part B. Medication is not eligible for pharmacy benefits and must be billed through medical insurance. As our team only handles pharmacy related prior auths, medical PA's must be submitted by the clinic. Thank you

## 2023-12-25 NOTE — Telephone Encounter (Signed)
 Order placed for Parachute:  FreeStyle Libre 3 Plus Sensor Only Supplier [employer] (Diabetes) Items Total Libre 3 Plus Sensor, change every 15 days HCPCS CGMS 12 Prescription details Directions for CGM use Use per manufacturer directions Insulin  treatment Manually, 2 times per day via injection

## 2023-12-25 NOTE — Telephone Encounter (Signed)
 Parachute order completed.

## 2024-01-05 ENCOUNTER — Other Ambulatory Visit (HOSPITAL_BASED_OUTPATIENT_CLINIC_OR_DEPARTMENT_OTHER): Payer: Self-pay | Admitting: Family

## 2024-01-19 NOTE — Progress Notes (Signed)
 Yolanda Saunders                                          MRN: [id number]   01/19/2024   The [facility] Quality Team Specialist reviewed this patient medical record for the purposes of chart review for care gap closure. The following were reviewed: chart review for care gap closure-kidney health evaluation for diabetes:eGFR  and uACR.    [facility] Quality Team

## 2024-04-21 ENCOUNTER — Ambulatory Visit: Admitting: Internal Medicine
